# Patient Record
Sex: Male | Born: 1955 | Race: Black or African American | Hispanic: No | Marital: Married | Smoking: Never smoker
Health system: Southern US, Community
[De-identification: ages and names within clinical notes are randomized; demographics above are authoritative.]

## PROBLEM LIST (undated history)

## (undated) DIAGNOSIS — R05 Cough: Secondary | ICD-10-CM

## (undated) DIAGNOSIS — M1990 Unspecified osteoarthritis, unspecified site: Secondary | ICD-10-CM

## (undated) DIAGNOSIS — K219 Gastro-esophageal reflux disease without esophagitis: Secondary | ICD-10-CM

## (undated) DIAGNOSIS — R059 Cough, unspecified: Secondary | ICD-10-CM

## (undated) HISTORY — PX: KNEE SURGERY: SHX244

## (undated) HISTORY — PX: THUMB ARTHROSCOPY: SHX2509

---

## 2000-08-04 ENCOUNTER — Ambulatory Visit (HOSPITAL_BASED_OUTPATIENT_CLINIC_OR_DEPARTMENT_OTHER): Admission: RE | Admit: 2000-08-04 | Discharge: 2000-08-04 | Payer: Self-pay | Admitting: Orthopedic Surgery

## 2009-04-18 ENCOUNTER — Ambulatory Visit (HOSPITAL_BASED_OUTPATIENT_CLINIC_OR_DEPARTMENT_OTHER): Admission: RE | Admit: 2009-04-18 | Discharge: 2009-04-18 | Payer: Self-pay | Admitting: Orthopaedic Surgery

## 2010-05-24 LAB — POCT HEMOGLOBIN-HEMACUE: Hemoglobin: 16.6 g/dL (ref 13.0–17.0)

## 2010-07-20 NOTE — Op Note (Signed)
[facility]  Patient:    Juan Snow, Juan Snow                        MRN: [id number] Proc. Date: 08/04/00 Adm. Date:  [id number] Attending:  Marlowe Shores                           Operative Report  PREOPERATIVE DIAGNOSIS:  Right thumb ulnar collateral ligament tear.  POSTOPERATIVE DIAGNOSIS:  Right thumb ulnar collateral ligament tear.  PROCEDURE:  Repair right thumb ulnar collateral ligament.  SURGEON:  Artist Pais. Mina Marble, M.D.  ASSISTANT:  Nurse.  ANESTHESIA:  Axillary block.  TOURNIQUET TIME:  40 minutes.  COMPLICATIONS:  None.  DRAINS:  None.  DESCRIPTION OF PROCEDURE:  The patient was taken to the operating room and after the induction of adequately axillary block analgesia the right upper extremity was prepped and draped in the usual sterile fashion.  Esmarch was used to exsanguinate the limb and the tourniquet was inflated to 250 mmHg.  At this point in time the C-shaped incision was made on the ulnar side of the metacarpal phalangeal joint of the right thumb and the large dorsi based flap was elevated.  The adductor aponeurosis was split longitudinally and elevated both dorsally and volarly thus exposing the ulnar side of the joint.  A ulnar sided capsulotomy was performed and once the capsule was then opened there was a displaced ulnar collateral ligament tear with a fairly large sized osseous fragment attached to it.  The fracture site was debrided of clot and nonviable material.  A gouge was used to make a transosseous channel.  At this point in time the collateral ligament and the bony fragments were fixed using a double arm 2-0 Prolene and a Keith needle passing it ulnar to the radial side and tied over a well padded button on the radial side of the metacarpal phalangeal joint and proximal phalanx area.  This reduced the fragment and the collateral ligament and stability was provided.  At this point in time the wound was  irrigated, the capsule was closed with horizontal mattress sutures of 4-0 Mersilene in a pants over vest fashion from volar to dorsal as well as the aponeurosis being repaired in the same manner. The skin was then closed with a running 3-0 Prolene subcuticular stitch. Steri-Strips, 4 x 4s, fluffy compressive dressing was applied as well as a radial nerve splint.  The patient tolerated the procedure and went to recovery room in stable fashion. DD:  08/04/00 TD:  08/04/00 Job: [id number]

## 2011-06-17 ENCOUNTER — Encounter (HOSPITAL_COMMUNITY): Payer: Self-pay | Admitting: *Deleted

## 2011-06-17 ENCOUNTER — Ambulatory Visit (HOSPITAL_COMMUNITY)
Admission: RE | Admit: 2011-06-17 | Discharge: 2011-06-17 | Disposition: A | Discharge status: Home / Self Care | Payer: 59 | Source: Ambulatory Visit | Attending: Gastroenterology | Admitting: Gastroenterology

## 2011-06-17 ENCOUNTER — Encounter (HOSPITAL_COMMUNITY)
Admission: RE | Disposition: A | Discharge status: Home / Self Care | Payer: Self-pay | Source: Ambulatory Visit | Attending: Gastroenterology

## 2011-06-17 DIAGNOSIS — Z79899 Other long term (current) drug therapy: Secondary | ICD-10-CM | POA: Insufficient documentation

## 2011-06-17 DIAGNOSIS — D126 Benign neoplasm of colon, unspecified: Secondary | ICD-10-CM | POA: Insufficient documentation

## 2011-06-17 DIAGNOSIS — Z1211 Encounter for screening for malignant neoplasm of colon: Secondary | ICD-10-CM | POA: Insufficient documentation

## 2011-06-17 DIAGNOSIS — K219 Gastro-esophageal reflux disease without esophagitis: Secondary | ICD-10-CM | POA: Insufficient documentation

## 2011-06-17 DIAGNOSIS — D128 Benign neoplasm of rectum: Secondary | ICD-10-CM | POA: Insufficient documentation

## 2011-06-17 DIAGNOSIS — K648 Other hemorrhoids: Secondary | ICD-10-CM | POA: Insufficient documentation

## 2011-06-17 DIAGNOSIS — K5730 Diverticulosis of large intestine without perforation or abscess without bleeding: Secondary | ICD-10-CM | POA: Insufficient documentation

## 2011-06-17 HISTORY — DX: Gastro-esophageal reflux disease without esophagitis: K21.9

## 2011-06-17 HISTORY — PX: COLONOSCOPY: SHX5424

## 2011-06-17 LAB — GLUCOSE, CAPILLARY: Glucose-Capillary: 97 mg/dL (ref 70–99)

## 2011-06-17 SURGERY — COLONOSCOPY
Anesthesia: Moderate Sedation

## 2011-06-17 MED ORDER — FENTANYL CITRATE 0.05 MG/ML IJ SOLN
INTRAMUSCULAR | Status: DC | PRN
  Administered 2011-06-17 (×2): 25 ug via INTRAVENOUS

## 2011-06-17 MED ORDER — MIDAZOLAM HCL 10 MG/2ML IJ SOLN
INTRAMUSCULAR | Status: DC | PRN
  Administered 2011-06-17: 2 mg via INTRAVENOUS
  Administered 2011-06-17: 1 mg via INTRAVENOUS
  Administered 2011-06-17: 2 mg via INTRAVENOUS

## 2011-06-17 MED ORDER — DIPHENHYDRAMINE HCL 50 MG/ML IJ SOLN
INTRAMUSCULAR | Status: AC
  Filled 2011-06-17: qty 1

## 2011-06-17 MED ORDER — FENTANYL CITRATE 0.05 MG/ML IJ SOLN
INTRAMUSCULAR | Status: AC
  Filled 2011-06-17: qty 2

## 2011-06-17 MED ORDER — SODIUM CHLORIDE 0.9 % IV SOLN
Freq: Once | INTRAVENOUS | Status: AC
  Administered 2011-06-17: 500 mL via INTRAVENOUS

## 2011-06-17 MED ORDER — MIDAZOLAM HCL 10 MG/2ML IJ SOLN
INTRAMUSCULAR | Status: AC
  Filled 2011-06-17: qty 2

## 2011-06-17 NOTE — Consult Note (Signed)
Reason for Consult: Colorectal cancer screening. Referring Physician: Madelyn Flavors MD  Juan Snow is an 56 y.o. male.  HPI: Patient is here for colorectal cancer screening. He has a history of acid reflux but denies any GI complaints at this time.  Past Medical History  Diagnosis Date  . GERD (gastroesophageal reflux disease)     Past Surgical History  Procedure Date  . Knee surgery   . Thumb arthroscopy    History reviewed. No pertinent family history.  Social History:  reports that he has never smoked. He does not have any smokeless tobacco history on file. He reports that he does not drink alcohol or use illicit drugs.  Allergies: No Known Allergies  Medications: I have reviewed the patient's current medications.  Results for orders placed during the hospital encounter of 06/17/11 (from the past 48 hour(s))  GLUCOSE, CAPILLARY     Status: Normal   Collection Time   06/17/11  1:38 PM      Component Value Range Comment   Glucose-Capillary 97  70 - 99 (mg/dL)    No results found.  Review of Systems  Constitutional: Negative.   HENT: Positive for neck pain.   Eyes: Negative.   Respiratory: Negative.   Cardiovascular: Negative.   Gastrointestinal: Positive for heartburn. Negative for nausea, vomiting, abdominal pain, diarrhea, constipation, blood in stool and melena.  Genitourinary: Negative.   Musculoskeletal: Positive for joint pain.  Skin: Negative.   Neurological: Negative.   Psychiatric/Behavioral: Negative.    Blood pressure 134/98, temperature 98.1 F (36.7 C), temperature source Oral, resp. rate 20, height 5\' 8"  (1.727 m), weight 85.276 kg (188 lb). Physical Exam  Constitutional: He is oriented to person, place, and time. He appears well-developed and well-nourished.  HENT:  Head: Normocephalic and atraumatic.  Eyes: Conjunctivae and EOM are normal. Pupils are equal, round, and reactive to light.  Neck: Normal range of motion. Neck supple.    Cardiovascular: Normal rate, regular rhythm and normal heart sounds.   Respiratory: Effort normal and breath sounds normal.  GI: Soft. Bowel sounds are normal.  Musculoskeletal: Normal range of motion.  Neurological: He is alert and oriented to person, place, and time. He has normal reflexes.  Psychiatric: He has a normal mood and affect. His behavior is normal. Judgment and thought content normal.    Assessment/Plan: 1) Colorectal cancer screening: Proceed with a colonoscopy at this time.  2) Acid reflux: On Dexilant.   Juan Snow 06/17/2011, 1:42 PM

## 2011-06-17 NOTE — Op Note (Signed)
[facility] [address] [place], [place]  [address]  OPERATIVE PROCEDURE REPORT  PATIENT:  Juan Snow, Juan Snow  MR#:  [id number] BIRTHDATE:  Dec 25, 1955  GENDER:  male ENDOSCOPIST:  Dr. Lorenza Burton, MD ASSISTANT:  Anthony Sar, RN and Windell Hummingbird, technician.  PROCEDURE DATE:  06/17/2011 PRE-PROCEDURE PREPERATION:  The patient was prepped with 32 oz. of Suprep the night before the procedure and 32 oz. the morning of the procedure.  The patient was fasted for 4 hours prior to the procedure. PRE-PROCEDURE PHYSICAL:  Patient has stable vital signs. Neck is supple. There is no JVD, thyromegaly or LAD. Chest clear to auscultation. S1 and S2 regular. Abdomen soft, non-distended, non-tender with NABS. PROCEDURE:  Colonoscopy with cold biopsies x 6 and hot snare polypectomy x 2. ASA CLASS:  Class II INDICATIONS:  1) Colorectal cancer screening, average risk MEDICATIONS:  Fentanyl 50 mcg IV & Versed 5 mg IV.  DESCRIPTION OF PROCEDURE: After the risks, benefits, and alternatives of the procedure were thoroughly explained [including a 10% missed rate of cancer and polyps], informed consent was obtained.  Digital rectal exam was performed and revealed no abnormalities. The [id number]) was introduced through the anus and advanced to the terminal ileum which was intubated for a short distance, without limitations.  The quality of the prep was good.. Multiple washes were done. Small lesions could be missed. The instrument was then slowly withdrawn as the colon was fully examined. <<PROCEDUREIMAGES>>  FINDINGS:  There were scattered diverticula noted throughout the colon. One 6-7 mm sessile polyp was removed by a hot snare x 2 [150/15] from the proximal right colon. Two small sessile polyps were removed by old bospies x 4 from the recum and 2 uch [polyps were removed by cold biopsies x 2 from the rectoiogmoid colon. The rest of the colonic mucosa appeared healthy with a  normal vascular pattern.  No masses or AVM's were noted. The appendiceal orifice and the ICV were identified and photographed. The terminal ileum appeared normal. Retroflexed views revealed small interal hemorrhoids. The patient tolerated the procedure without immediate complications.  The scope was then withdrawn from the patient and the procedure terminated.  IMPRESSION:  1) Small internal hemorrhoids. 2) Pandiverticulosis. 3) Multiple colonic polyps removed [see description above]. 4) Normal terminal ileum.  RECOMMENDATIONS:  1) Await pathology results. 2) Hold all NSAIDS for 2 weeks. 3) High fiber diet with liberal fluid intake. 4) OP follow-up is advised on a PRN basis.  REPEAT EXAM:  In 5 years with a 3 day prep; in case the patient has any abnormal GI symptoms in the interim, he should contact the office immediately for further recommendations.  DISCHARGE INSTRUCTIONS:  Standard discharge instructions given.  ______________________________ Dr. Lorenza Burton, MD  CPT CODES: [phone], [phone]  DIAGNOSIS CODES:  V76.51, 211.3, 562.10  CC:  Andi Devon, M.D.  n. eSIGNED:   Dr. Lorenza Burton at 06/17/2011 02:31 PM  Collene Gobble, [phone]

## 2011-06-17 NOTE — Discharge Instructions (Addendum)
Colonoscopy Care After Read the instructions outlined below and refer to this sheet in the next few weeks. These discharge instructions provide you with general information on caring for yourself after you leave the hospital. Your doctor may also give you specific instructions. While your treatment has been planned according to the most current medical practices available, unavoidable complications occasionally occur. If you have any problems or questions after discharge, call your doctor. HOME CARE INSTRUCTIONS ACTIVITY:  You may resume your regular activity, but move at a slower pace for the next 24 hours.   Take frequent rest periods for the next 24 hours.   Walking will help get rid of the air and reduce the bloated feeling in your belly (abdomen).   No driving for 24 hours (because of the medicine (anesthesia) used during the test).   You may shower.   Do not sign any important legal documents or operate any machinery for 24 hours (because of the anesthesia used during the test).  NUTRITION:  Drink plenty of fluids.   You may resume your normal diet as instructed by your doctor.   Begin with a light meal and progress to your normal diet. Heavy or fried foods are harder to digest and may make you feel sick to your stomach (nauseated).   Avoid alcoholic beverages for 24 hours or as instructed.  MEDICATIONS:  You may resume your normal medications unless your doctor tells you otherwise.  WHAT TO EXPECT TODAY:  Some feelings of bloating in the abdomen.   Passage of more gas than usual.   Spotting of blood in your stool or on the toilet paper.  IF YOU HAD POLYPS REMOVED DURING THE COLONOSCOPY:  No aspirin products for 7 days or as instructed.   No alcohol for 7 days or as instructed.   Eat a soft diet for the next 24 hours.  FINDING OUT THE RESULTS OF YOUR TEST Not all test results are available during your visit. If your test results are not back during the visit, make an  appointment with your caregiver to find out the results. Do not assume everything is normal if you have not heard from your caregiver or the medical facility. It is important for you to follow up on all of your test results.  SEEK IMMEDIATE MEDICAL CARE IF:  You have more than a spotting of blood in your stool.   Your belly is swollen (abdominal distention).   You are nauseated or vomiting.   You have a fever.   You have abdominal pain or discomfort that is severe or gets worse throughout the day.  Document Released: 10/03/2003 Document Revised: 02/07/2011 Document Reviewed: 10/01/2007 [employer] Patient Information 2012 [employer]. 

## 2011-06-18 ENCOUNTER — Encounter (HOSPITAL_COMMUNITY): Payer: Self-pay | Admitting: Gastroenterology

## 2015-05-16 DIAGNOSIS — Z125 Encounter for screening for malignant neoplasm of prostate: Secondary | ICD-10-CM | POA: Diagnosis not present

## 2015-05-16 DIAGNOSIS — Z0000 Encounter for general adult medical examination without abnormal findings: Secondary | ICD-10-CM | POA: Diagnosis not present

## 2015-05-16 DIAGNOSIS — Z13220 Encounter for screening for lipoid disorders: Secondary | ICD-10-CM | POA: Diagnosis not present

## 2015-05-16 DIAGNOSIS — K219 Gastro-esophageal reflux disease without esophagitis: Secondary | ICD-10-CM | POA: Diagnosis not present

## 2015-06-12 MED FILL — DENTA 5000 PLUS CREAM: 1.1 | 14 days supply | Qty: 51 | Fill #0

## 2015-06-25 DIAGNOSIS — R509 Fever, unspecified: Secondary | ICD-10-CM | POA: Diagnosis not present

## 2015-06-26 ENCOUNTER — Other Ambulatory Visit: Payer: Self-pay | Admitting: Family Medicine

## 2015-06-26 ENCOUNTER — Ambulatory Visit
Admission: RE | Admit: 2015-06-26 | Discharge: 2015-06-26 | Disposition: A | Discharge status: Home / Self Care | Payer: 59 | Source: Ambulatory Visit | Attending: Family Medicine | Admitting: Family Medicine

## 2015-06-26 DIAGNOSIS — R1010 Upper abdominal pain, unspecified: Secondary | ICD-10-CM | POA: Diagnosis not present

## 2015-06-26 DIAGNOSIS — K8021 Calculus of gallbladder without cholecystitis with obstruction: Secondary | ICD-10-CM | POA: Diagnosis not present

## 2015-06-26 DIAGNOSIS — R509 Fever, unspecified: Secondary | ICD-10-CM | POA: Diagnosis not present

## 2015-06-26 DIAGNOSIS — R109 Unspecified abdominal pain: Secondary | ICD-10-CM

## 2015-06-26 DIAGNOSIS — R1013 Epigastric pain: Secondary | ICD-10-CM | POA: Diagnosis not present

## 2015-06-26 IMAGING — CT CT ABD-PELV W/O CM
2 of 4 series · 15 of 46 positions shown, 17 images · non-contrast
Comparison: None.

CLINICAL DATA: Mid upper abdominal pain for 4 days.

EXAM:
CT ABDOMEN AND PELVIS WITHOUT CONTRAST
TECHNIQUE: Multidetector CT imaging of the abdomen and pelvis was performed
following the standard protocol without IV contrast.

[Series 2: abd/pelvis w/(date) · axial · 0.72mm/px · z∈[+633,+1073]mm · 12 of 100 slices shown, 14 images]
[im 8/100  soft-tissue]
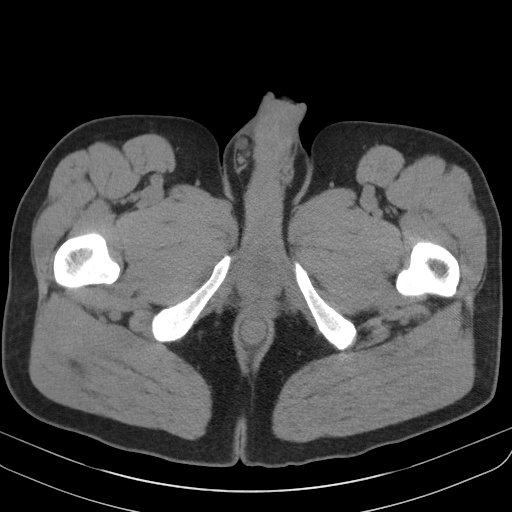
[im 8/100  bone]
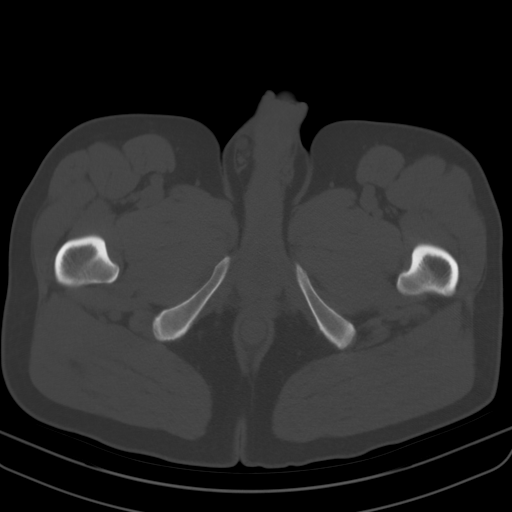
[im 16/100  soft-tissue]
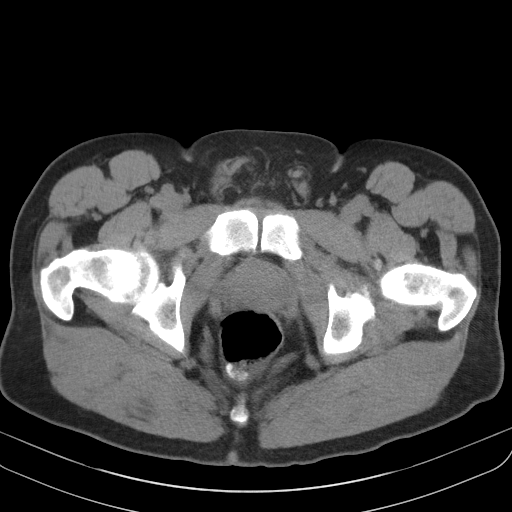
[im 24/100  soft-tissue]
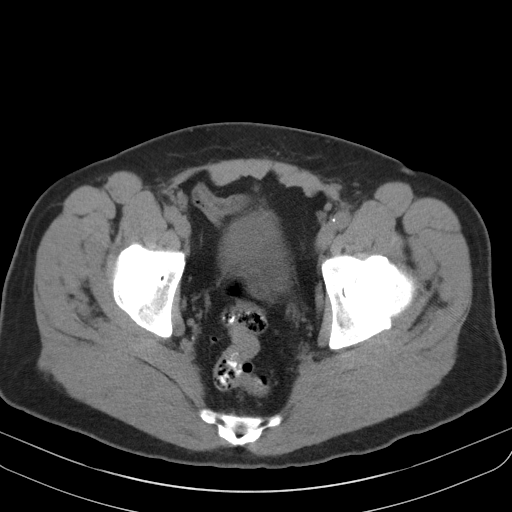
[im 32/100  soft-tissue]
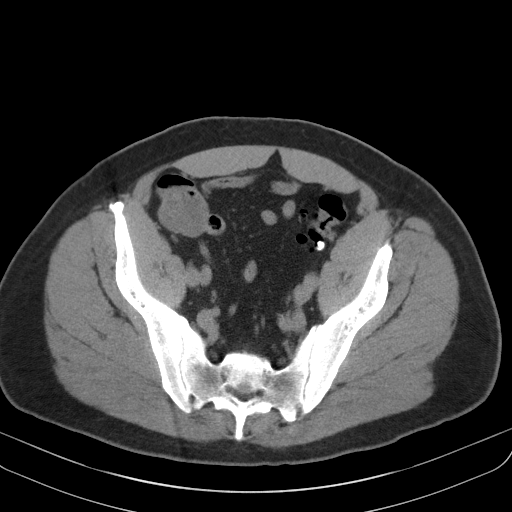
[im 40/100  soft-tissue]
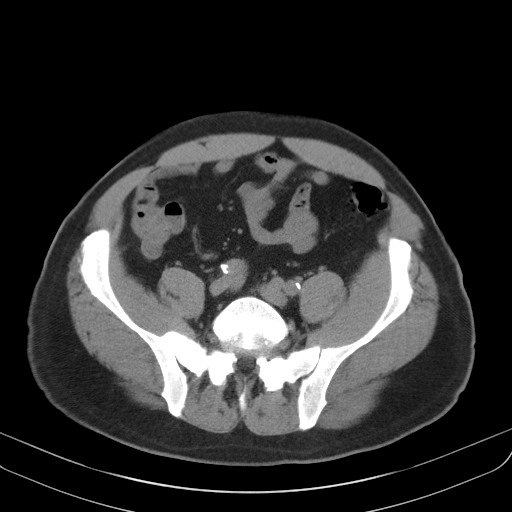
[im 48/100  soft-tissue]
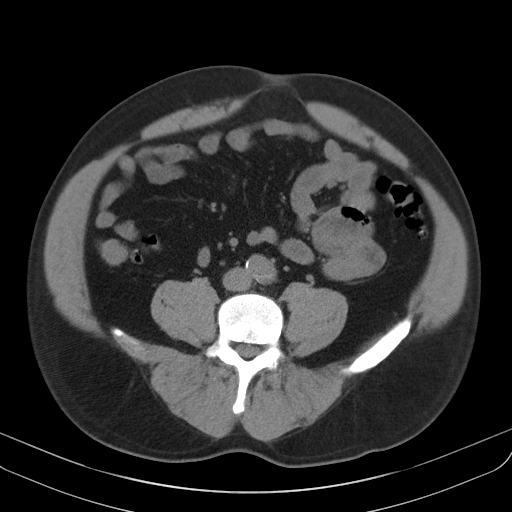
[im 56/100  soft-tissue]
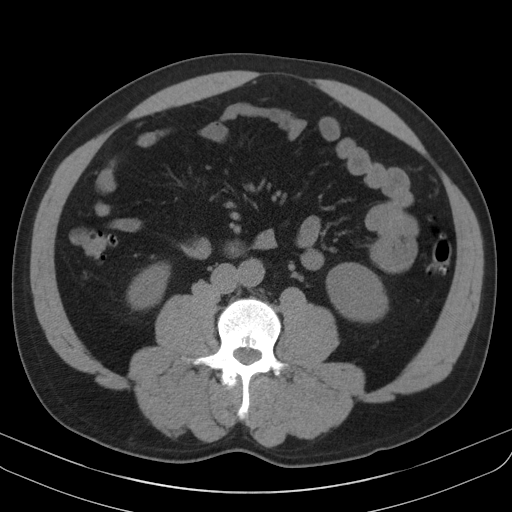
[im 64/100  soft-tissue]
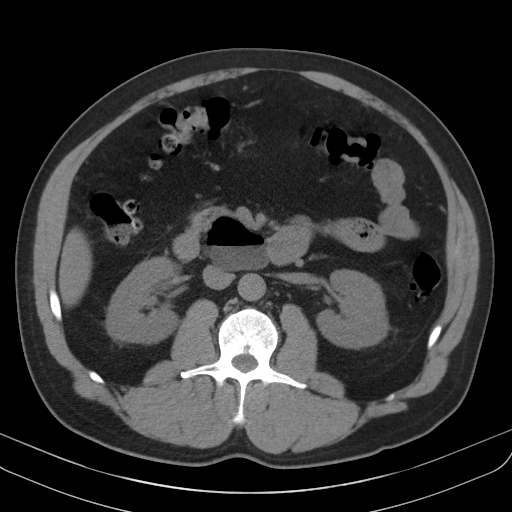
[im 72/100  soft-tissue]
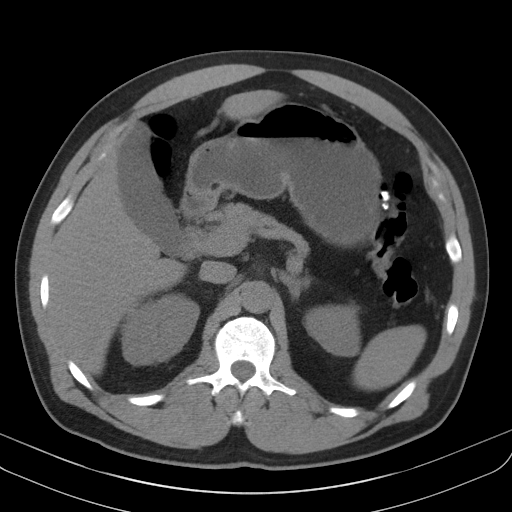
[im 72/100  bone]
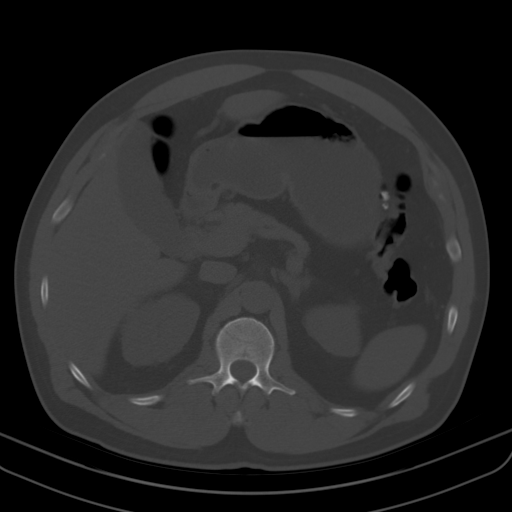
[im 80/100  soft-tissue]
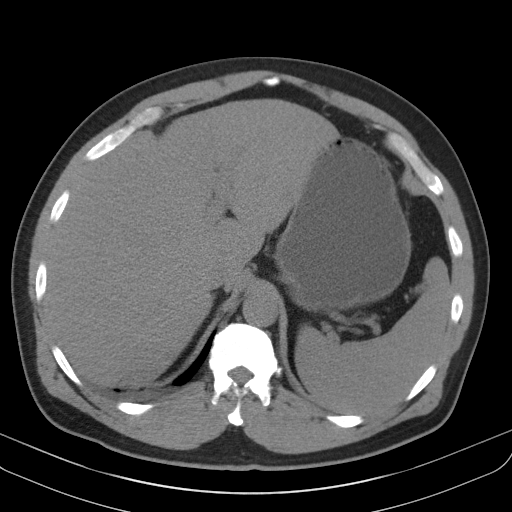
[im 88/100  soft-tissue]
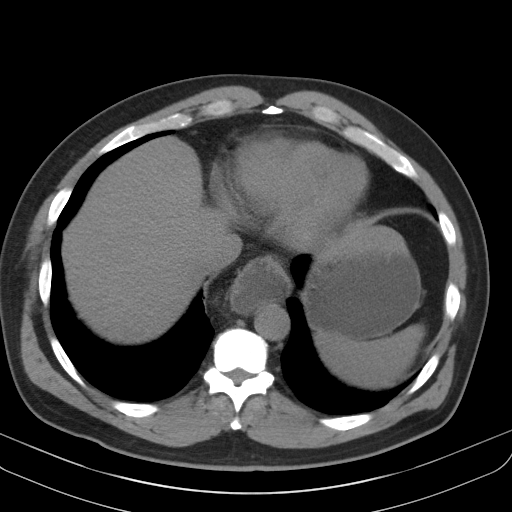
[im 96/100  soft-tissue]
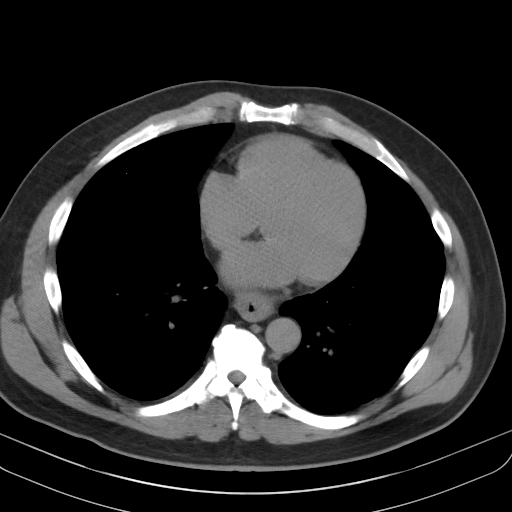

[Series 3: cor · coronal · 0.69mm/px · 3 of 105 slices shown]
[im 35/105  soft-tissue]
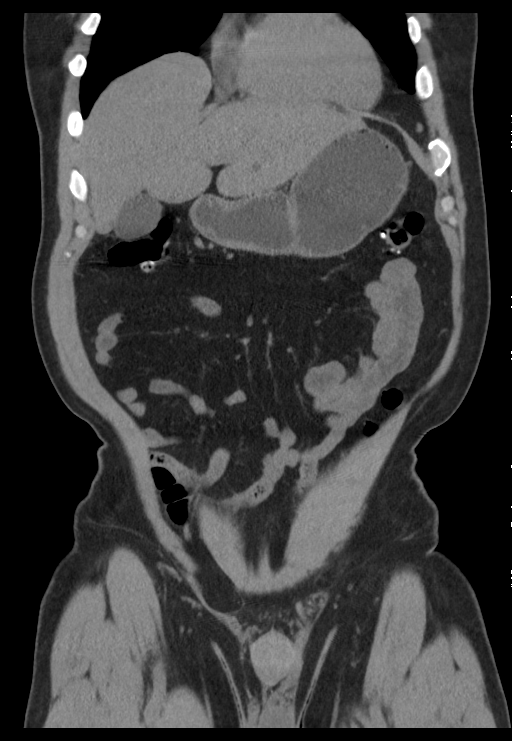
[im 47/105  soft-tissue]
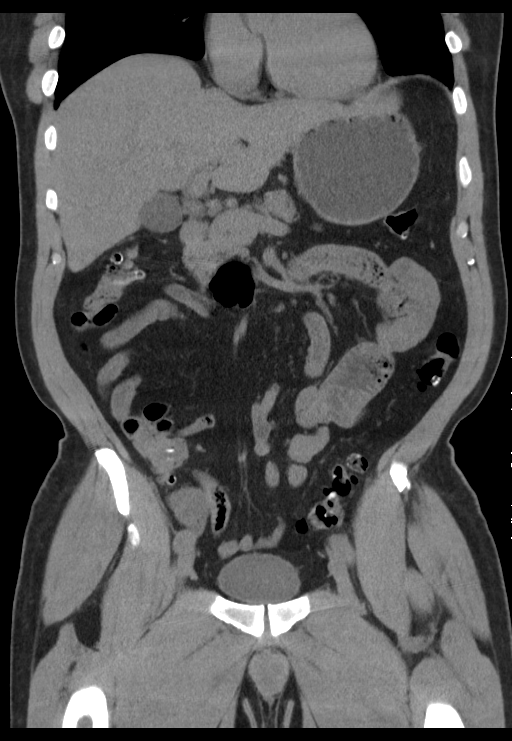
[im 58/105  soft-tissue]
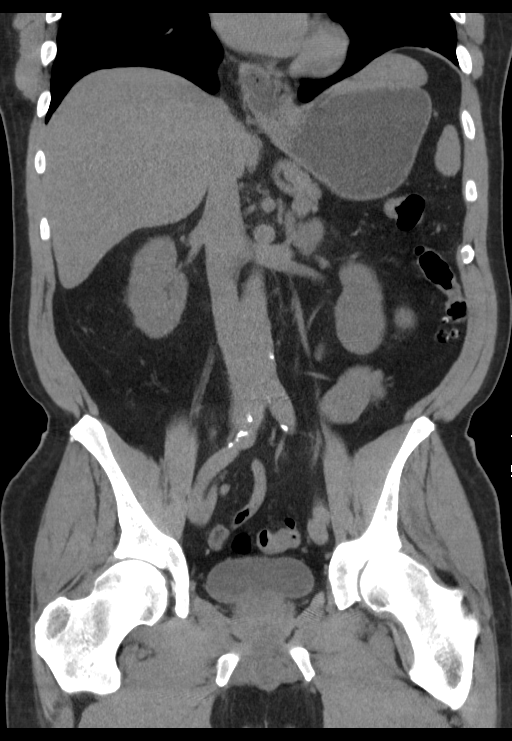

[15 of 46 positions shown; findings below may reference images not displayed]

FINDINGS: Lower chest: There is a large hiatal hernia. There is focal
eventration of the central portion of the right hemidiaphragm with
slight protrusion of the dome of the right lobe of the liver into
the right hemi thorax. This is not felt to be significant.

Hepatobiliary: There is at least 1 tiny stone in the neck of the
gallbladder. No bile duct dilatation. No gallbladder wall
thickening. There are several small low-density areas in the left
lobe of the liver including 8 and 4 mm lesions on image 22 of series
2. These probably represent benign hepatic cysts.

Pancreas: Normal.

Spleen: Normal.

Adrenals/Urinary Tract: 2.4 x 1.4 cm parapelvic cyst in the upper
pole of the right kidney. Probable 2.1 x 1.0 cm peripelvic cyst in
the inferior aspect of the left kidney. Otherwise normal.

Stomach/Bowel: Large hiatal hernia. 4 cm diverticulum in the third
portion of the duodenum. Multiple diverticula throughout the colon.
No diverticulitis. Terminal ileum and appendix are normal.

Vascular/Lymphatic: No adenopathy.  Aorto iliac atherosclerosis.

Reproductive: Normal.

Other: No free air or free fluid.

Musculoskeletal: Multilevel degenerative facet arthritis in the
lumbar spine. Broad-based small disc protrusion at L4-5. Slight
arthritis of both hip joints. No acute abnormalities.
IMPRESSION: There is at least 1 tiny stone in the neck of the gallbladder.

Probable small cysts in the left lobe of liver.

Large hiatal hernia.

Focal eventration of the central portion of the right hemidiaphragm
with slight protrusion of the dome of the right lobe of the liver
into the right hemi thorax.

Extensive diverticulosis of the colon.

## 2016-05-20 DIAGNOSIS — K219 Gastro-esophageal reflux disease without esophagitis: Secondary | ICD-10-CM | POA: Diagnosis not present

## 2016-05-20 DIAGNOSIS — Z125 Encounter for screening for malignant neoplasm of prostate: Secondary | ICD-10-CM | POA: Diagnosis not present

## 2016-05-20 DIAGNOSIS — E559 Vitamin D deficiency, unspecified: Secondary | ICD-10-CM | POA: Diagnosis not present

## 2016-05-20 DIAGNOSIS — E569 Vitamin deficiency, unspecified: Secondary | ICD-10-CM | POA: Diagnosis not present

## 2016-05-20 DIAGNOSIS — R030 Elevated blood-pressure reading, without diagnosis of hypertension: Secondary | ICD-10-CM | POA: Diagnosis not present

## 2016-05-20 DIAGNOSIS — E7800 Pure hypercholesterolemia, unspecified: Secondary | ICD-10-CM | POA: Diagnosis not present

## 2016-05-20 DIAGNOSIS — M25552 Pain in left hip: Secondary | ICD-10-CM | POA: Diagnosis not present

## 2016-05-20 DIAGNOSIS — Z0000 Encounter for general adult medical examination without abnormal findings: Secondary | ICD-10-CM | POA: Diagnosis not present

## 2016-05-20 MED FILL — MELOXICAM 15 MG TABLET: 15 | 30 days supply | Qty: 30 | Fill #0

## 2016-05-24 DIAGNOSIS — M545 Low back pain: Secondary | ICD-10-CM | POA: Diagnosis not present

## 2016-05-24 DIAGNOSIS — M25552 Pain in left hip: Secondary | ICD-10-CM | POA: Diagnosis not present

## 2016-05-29 DIAGNOSIS — M25552 Pain in left hip: Secondary | ICD-10-CM | POA: Diagnosis not present

## 2016-07-08 MED FILL — MELOXICAM 15 MG TABLET: 15 | 30 days supply | Qty: 30 | Fill #0

## 2016-07-18 DIAGNOSIS — M25552 Pain in left hip: Secondary | ICD-10-CM | POA: Diagnosis not present

## 2016-07-18 DIAGNOSIS — E7800 Pure hypercholesterolemia, unspecified: Secondary | ICD-10-CM | POA: Diagnosis not present

## 2016-07-18 DIAGNOSIS — R030 Elevated blood-pressure reading, without diagnosis of hypertension: Secondary | ICD-10-CM | POA: Diagnosis not present

## 2016-07-18 DIAGNOSIS — E559 Vitamin D deficiency, unspecified: Secondary | ICD-10-CM | POA: Diagnosis not present

## 2016-07-18 DIAGNOSIS — R748 Abnormal levels of other serum enzymes: Secondary | ICD-10-CM | POA: Diagnosis not present

## 2016-08-21 MED FILL — AMOXICILLIN 500 MG CAPSULE: 500 | 9 days supply | Qty: 30 | Fill #0

## 2016-08-21 MED FILL — MELOXICAM 15 MG TABLET: 15 | 30 days supply | Qty: 30 | Fill #1

## 2016-09-26 MED FILL — MELOXICAM 15 MG TABLET: 15 | 30 days supply | Qty: 30 | Fill #0

## 2016-11-11 MED FILL — MELOXICAM 15 MG TABLET: 15 | 30 days supply | Qty: 30 | Fill #1

## 2016-11-21 DIAGNOSIS — M25552 Pain in left hip: Secondary | ICD-10-CM | POA: Diagnosis not present

## 2016-11-21 DIAGNOSIS — R030 Elevated blood-pressure reading, without diagnosis of hypertension: Secondary | ICD-10-CM | POA: Diagnosis not present

## 2016-11-21 DIAGNOSIS — E7800 Pure hypercholesterolemia, unspecified: Secondary | ICD-10-CM | POA: Diagnosis not present

## 2016-11-21 DIAGNOSIS — Z5181 Encounter for therapeutic drug level monitoring: Secondary | ICD-10-CM | POA: Diagnosis not present

## 2016-12-17 MED FILL — MELOXICAM 15 MG TABLET: 15 | 30 days supply | Qty: 30 | Fill #2

## 2017-01-03 MED FILL — DICLOFENAC SODIUM 1% GEL: 1 | 6 days supply | Qty: 100 | Fill #0

## 2017-02-13 MED FILL — MELOXICAM 15 MG TABLET: 15 | 30 days supply | Qty: 30 | Fill #3

## 2017-03-19 DIAGNOSIS — M5416 Radiculopathy, lumbar region: Secondary | ICD-10-CM | POA: Diagnosis not present

## 2017-03-19 DIAGNOSIS — M25552 Pain in left hip: Secondary | ICD-10-CM | POA: Diagnosis not present

## 2017-03-19 DIAGNOSIS — M1612 Unilateral primary osteoarthritis, left hip: Secondary | ICD-10-CM | POA: Diagnosis not present

## 2017-03-25 MED FILL — MELOXICAM 15 MG TABLET: 15 | 30 days supply | Qty: 30 | Fill #4

## 2017-04-04 DIAGNOSIS — M25552 Pain in left hip: Secondary | ICD-10-CM | POA: Diagnosis not present

## 2017-04-04 DIAGNOSIS — M1612 Unilateral primary osteoarthritis, left hip: Secondary | ICD-10-CM | POA: Diagnosis not present

## 2017-04-09 DIAGNOSIS — M1612 Unilateral primary osteoarthritis, left hip: Secondary | ICD-10-CM | POA: Diagnosis not present

## 2017-04-21 DIAGNOSIS — R7989 Other specified abnormal findings of blood chemistry: Secondary | ICD-10-CM | POA: Diagnosis not present

## 2017-04-21 DIAGNOSIS — R030 Elevated blood-pressure reading, without diagnosis of hypertension: Secondary | ICD-10-CM | POA: Diagnosis not present

## 2017-04-21 DIAGNOSIS — E7800 Pure hypercholesterolemia, unspecified: Secondary | ICD-10-CM | POA: Diagnosis not present

## 2017-04-21 DIAGNOSIS — Z79899 Other long term (current) drug therapy: Secondary | ICD-10-CM | POA: Diagnosis not present

## 2017-04-21 DIAGNOSIS — R748 Abnormal levels of other serum enzymes: Secondary | ICD-10-CM | POA: Diagnosis not present

## 2017-04-21 DIAGNOSIS — Z01818 Encounter for other preprocedural examination: Secondary | ICD-10-CM | POA: Diagnosis not present

## 2017-04-21 DIAGNOSIS — M25552 Pain in left hip: Secondary | ICD-10-CM | POA: Diagnosis not present

## 2017-05-08 ENCOUNTER — Ambulatory Visit: Payer: Self-pay | Admitting: Orthopedic Surgery

## 2017-06-09 DIAGNOSIS — R030 Elevated blood-pressure reading, without diagnosis of hypertension: Secondary | ICD-10-CM | POA: Diagnosis not present

## 2017-06-09 DIAGNOSIS — K219 Gastro-esophageal reflux disease without esophagitis: Secondary | ICD-10-CM | POA: Diagnosis not present

## 2017-06-09 DIAGNOSIS — E559 Vitamin D deficiency, unspecified: Secondary | ICD-10-CM | POA: Diagnosis not present

## 2017-06-09 DIAGNOSIS — H524 Presbyopia: Secondary | ICD-10-CM | POA: Diagnosis not present

## 2017-06-09 DIAGNOSIS — Z125 Encounter for screening for malignant neoplasm of prostate: Secondary | ICD-10-CM | POA: Diagnosis not present

## 2017-06-09 DIAGNOSIS — Z0000 Encounter for general adult medical examination without abnormal findings: Secondary | ICD-10-CM | POA: Diagnosis not present

## 2017-06-09 DIAGNOSIS — E7800 Pure hypercholesterolemia, unspecified: Secondary | ICD-10-CM | POA: Diagnosis not present

## 2017-06-09 DIAGNOSIS — M25552 Pain in left hip: Secondary | ICD-10-CM | POA: Diagnosis not present

## 2017-06-09 DIAGNOSIS — E569 Vitamin deficiency, unspecified: Secondary | ICD-10-CM | POA: Diagnosis not present

## 2017-06-12 DIAGNOSIS — M1612 Unilateral primary osteoarthritis, left hip: Secondary | ICD-10-CM | POA: Diagnosis not present

## 2017-06-13 ENCOUNTER — Other Ambulatory Visit (HOSPITAL_COMMUNITY): Payer: Self-pay | Admitting: *Deleted

## 2017-06-13 NOTE — Patient Instructions (Addendum)
Juan Snow  06/13/2017   Your procedure is scheduled on: 06-25-17  Report to [facility] Main  Entrance  Report to admitting at 1145 AM    Call this number if you have problems the morning of surgery [phone]   Remember: Do not eat food  :After Midnight. Clear liquids from midnight until 800 am, nothing by mouth after 800 am day of surgery.     CLEAR LIQUID DIET   Foods Allowed                                                                     Foods Excluded  Coffee and tea, regular and decaf                             liquids that you cannot  Plain Jell-O in any flavor                                             see through such as: Fruit ices (not with fruit pulp)                                     milk, soups, orange juice  Iced Popsicles                                    All solid food Carbonated beverages, regular and diet                                    Cranberry, grape and apple juices Sports drinks like Gatorade Lightly seasoned clear broth or consume(fat free) Sugar, honey syrup  Sample Menu Breakfast                                Lunch                                     Supper Cranberry juice                    Beef broth                            Chicken broth Jell-O                                     Grape juice                           Apple juice Coffee or tea  Jell-O                                      Popsicle                                                Coffee or tea                        Coffee or tea  _____________________________________________________________________     Take these medicines the morning of surgery with A SIP OF WATER: NEXIUM                              You may not have any metal on your body including hair pins and              piercings  Do not wear jewelry, make-up, lotions, powders or perfumes, deodorant             Do not wear nail polish.  Do not shave  48 hours  prior to surgery.              Men may shave face and neck.   Do not bring valuables to the hospital. [place].  Contacts, dentures or bridgework may not be worn into surgery.  Leave suitcase in the car. After surgery it may be brought to your room.                 Please read over the following fact sheets you were given: _____________________________________________________________________ [facility] - Preparing for Surgery Before surgery, you can play an important role.  Because skin is not sterile, your skin needs to be as free of germs as possible.  You can reduce the number of germs on your skin by washing with CHG (chlorahexidine gluconate) soap before surgery.  CHG is an antiseptic cleaner which kills germs and bonds with the skin to continue killing germs even after washing. Please DO NOT use if you have an allergy to CHG or antibacterial soaps.  If your skin becomes reddened/irritated stop using the CHG and inform your nurse when you arrive at Short Stay. Do not shave (including legs and underarms) for at least 48 hours prior to the first CHG shower.  You may shave your face/neck. Please follow these instructions carefully:  1.  Shower with CHG Soap the night before surgery and the  morning of Surgery.  2.  If you choose to wash your hair, wash your hair first as usual with your  normal  shampoo.  3.  After you shampoo, rinse your hair and body thoroughly to remove the  shampoo.                           4.  Use CHG as you would any other liquid soap.  You can apply chg directly  to the skin and wash                       Gently with a scrungie or clean washcloth.  5.  Apply the CHG Soap to your body ONLY FROM THE NECK DOWN.   Do not use on face/ open                           Wound or open sores. Avoid contact with eyes, ears mouth and genitals (private parts).                       Wash face,  Genitals (private parts) with your  normal soap.             6.  Wash thoroughly, paying special attention to the area where your surgery  will be performed.  7.  Thoroughly rinse your body with warm water from the neck down.  8.  DO NOT shower/wash with your normal soap after using and rinsing off  the CHG Soap.                9.  Pat yourself dry with a clean towel.            10.  Wear clean pajamas.            11.  Place clean sheets on your bed the night of your first shower and do not  sleep with pets. Day of Surgery : Do not apply any lotions/deodorants the morning of surgery.  Please wear clean clothes to the hospital/surgery center.  FAILURE TO FOLLOW THESE INSTRUCTIONS MAY RESULT IN THE CANCELLATION OF YOUR SURGERY PATIENT SIGNATURE_________________________________  NURSE SIGNATURE__________________________________  ________________________________________________________________________   Adam Phenix  An incentive spirometer is a tool that can help keep your lungs clear and active. This tool measures how well you are filling your lungs with each breath. Taking long deep breaths may help reverse or decrease the chance of developing breathing (pulmonary) problems (especially infection) following:  A long period of time when you are unable to move or be active. BEFORE THE PROCEDURE   If the spirometer includes an indicator to show your best effort, your nurse or respiratory therapist will set it to a desired goal.  If possible, sit up straight or lean slightly forward. Try not to slouch.  Hold the incentive spirometer in an upright position. INSTRUCTIONS FOR USE  1. Sit on the edge of your bed if possible, or sit up as far as you can in bed or on a chair. 2. Hold the incentive spirometer in an upright position. 3. Breathe out normally. 4. Place the mouthpiece in your mouth and seal your lips tightly around it. 5. Breathe in slowly and as deeply as possible, raising the piston or the ball toward the  top of the column. 6. Hold your breath for 3-5 seconds or for as long as possible. Allow the piston or ball to fall to the bottom of the column. 7. Remove the mouthpiece from your mouth and breathe out normally. 8. Rest for a few seconds and repeat Steps 1 through 7 at least 10 times every 1-2 hours when you are awake. Take your time and take a few normal breaths between deep breaths. 9. The spirometer may include an indicator to show your best effort. Use the indicator as a goal to work toward during each repetition. 10. After each set of 10 deep breaths, practice coughing to be sure your lungs are clear. If you have an incision (the cut made at the time of surgery), support your incision when coughing by placing  a pillow or rolled up towels firmly against it. Once you are able to get out of bed, walk around indoors and cough well. You may stop using the incentive spirometer when instructed by your caregiver.  RISKS AND COMPLICATIONS  Take your time so you do not get dizzy or light-headed.  If you are in pain, you may need to take or ask for pain medication before doing incentive spirometry. It is harder to take a deep breath if you are having pain. AFTER USE  Rest and breathe slowly and easily.  It can be helpful to keep track of a log of your progress. Your caregiver can provide you with a simple table to help with this. If you are using the spirometer at home, follow these instructions: [place] IF:   You are having difficultly using the spirometer.  You have trouble using the spirometer as often as instructed.  Your pain medication is not giving enough relief while using the spirometer.  You develop fever of 100.5 F (38.1 C) or higher. SEEK IMMEDIATE MEDICAL CARE IF:   You cough up bloody sputum that had not been present before.  You develop fever of 102 F (38.9 C) or greater.  You develop worsening pain at or near the incision site. MAKE SURE YOU:   Understand  these instructions.  Will watch your condition.  Will get help right away if you are not doing well or get worse. Document Released: 07/01/2006 Document Revised: 05/13/2011 Document Reviewed: 09/01/2006 ExitCare Patient Information 2014 ExitCare, [place].   ________________________________________________________________________  WHAT IS A BLOOD TRANSFUSION? Blood Transfusion Information  A transfusion is the replacement of blood or some of its parts. Blood is made up of multiple cells which provide different functions.  Red blood cells carry oxygen and are used for blood loss replacement.  White blood cells fight against infection.  Platelets control bleeding.  Plasma helps clot blood.  Other blood products are available for specialized needs, such as hemophilia or other clotting disorders. BEFORE THE TRANSFUSION  Who gives blood for transfusions?   Healthy volunteers who are fully evaluated to make sure their blood is safe. This is blood bank blood. Transfusion therapy is the safest it has ever been in the practice of medicine. Before blood is taken from a donor, a complete history is taken to make sure that person has no history of diseases nor engages in risky social behavior (examples are intravenous drug use or sexual activity with multiple partners). The donor's travel history is screened to minimize risk of transmitting infections, such as malaria. The donated blood is tested for signs of infectious diseases, such as HIV and hepatitis. The blood is then tested to be sure it is compatible with you in order to minimize the chance of a transfusion reaction. If you or a relative donates blood, this is often done in anticipation of surgery and is not appropriate for emergency situations. It takes many days to process the donated blood. RISKS AND COMPLICATIONS Although transfusion therapy is very safe and saves many lives, the main dangers of transfusion include:   Getting an  infectious disease.  Developing a transfusion reaction. This is an allergic reaction to something in the blood you were given. Every precaution is taken to prevent this. The decision to have a blood transfusion has been considered carefully by your caregiver before blood is given. Blood is not given unless the benefits outweigh the risks. AFTER THE TRANSFUSION  Right after receiving a blood transfusion,  you will usually feel much better and more energetic. This is especially true if your red blood cells have gotten low (anemic). The transfusion raises the level of the red blood cells which carry oxygen, and this usually causes an energy increase.  The nurse administering the transfusion will monitor you carefully for complications. HOME CARE INSTRUCTIONS  No special instructions are needed after a transfusion. You may find your energy is better. Speak with your caregiver about any limitations on activity for underlying diseases you may have. SEEK MEDICAL CARE IF:   Your condition is not improving after your transfusion.  You develop redness or irritation at the intravenous (IV) site. SEEK IMMEDIATE MEDICAL CARE IF:  Any of the following symptoms occur over the next 12 hours:  Shaking chills.  You have a temperature by mouth above 102 F (38.9 C), not controlled by medicine.  Chest, back, or muscle pain.  People around you feel you are not acting correctly or are confused.  Shortness of breath or difficulty breathing.  Dizziness and fainting.  You get a rash or develop hives.  You have a decrease in urine output.  Your urine turns a dark color or changes to pink, red, or brown. Any of the following symptoms occur over the next 10 days:  You have a temperature by mouth above 102 F (38.9 C), not controlled by medicine.  Shortness of breath.  Weakness after normal activity.  The white part of the eye turns yellow (jaundice).  You have a decrease in the amount of urine or  are urinating less often.  Your urine turns a dark color or changes to pink, red, or brown. Document Released: 02/16/2000 Document Revised: 05/13/2011 Document Reviewed: 10/05/2007 [facility] Patient Information 2014 [place], [place].  _______________________________________________________________________

## 2017-06-13 NOTE — Progress Notes (Signed)
Medical clearance dr Jacelyn Grip 04-21-17 ON CHART FOR 06-25-17 SURGERY WITH DR Wynelle Link

## 2017-06-16 ENCOUNTER — Other Ambulatory Visit (HOSPITAL_COMMUNITY): Payer: Self-pay | Admitting: *Deleted

## 2017-06-18 ENCOUNTER — Other Ambulatory Visit: Payer: Self-pay

## 2017-06-18 ENCOUNTER — Encounter (HOSPITAL_COMMUNITY)
Admission: RE | Admit: 2017-06-18 | Discharge: 2017-06-18 | Disposition: A | Discharge status: Home / Self Care | Payer: 59 | Source: Ambulatory Visit | Attending: Orthopedic Surgery | Admitting: Orthopedic Surgery

## 2017-06-18 ENCOUNTER — Encounter (HOSPITAL_COMMUNITY): Payer: Self-pay

## 2017-06-18 ENCOUNTER — Encounter (INDEPENDENT_AMBULATORY_CARE_PROVIDER_SITE_OTHER): Payer: Self-pay

## 2017-06-18 DIAGNOSIS — M1612 Unilateral primary osteoarthritis, left hip: Secondary | ICD-10-CM | POA: Insufficient documentation

## 2017-06-18 DIAGNOSIS — Z01812 Encounter for preprocedural laboratory examination: Secondary | ICD-10-CM | POA: Diagnosis not present

## 2017-06-18 HISTORY — DX: Cough, unspecified: R05.9

## 2017-06-18 HISTORY — DX: Cough: R05

## 2017-06-18 HISTORY — DX: Unspecified osteoarthritis, unspecified site: M19.90

## 2017-06-18 LAB — PROTIME-INR
INR: 0.93
PROTHROMBIN TIME: 12.4 s (ref 11.4–15.2)

## 2017-06-18 LAB — ABO/RH: ABO/RH(D): B POS

## 2017-06-18 LAB — SURGICAL PCR SCREEN
MRSA, PCR: NEGATIVE
STAPHYLOCOCCUS AUREUS: NEGATIVE

## 2017-06-18 LAB — APTT: aPTT: 32 seconds (ref 24–36)

## 2017-06-18 NOTE — Progress Notes (Signed)
Cbc with dif and cmet 06-09-17 dr Jacelyn Grip on chart

## 2017-06-20 ENCOUNTER — Ambulatory Visit: Payer: Self-pay | Admitting: Orthopedic Surgery

## 2017-06-20 NOTE — H&P (View-Only) (Signed)
Juan Snow, Juan Snow (62yo, M) DOB [birth year]  Chief Complaint Left HIp Pain H&P left total hip 06-25-2017  Patient's Care Team Neurosurgeon: Peggyann Shoals MD: [address], [place], [place] [address], Ph [phone], Fax [phone] NPI: [id number] Primary Care Provider: Vernie Shanks MD: [address], [place], [place] [address], Ph [phone], Fax [phone] NPI: [id number]  Patient's Pharmacies [facility]): [address] [place] [address], Ph [phone], Fax [phone] [id number]): [address] [place] [address], Ph [phone], Fax [phone]  Vitals Ht: 5 ft 7.5 in Wt: 180 lbs BMI: 27.8  BP: 124/84 sitting R arm  Pulse: 68 bpm   Allergies Reviewed Allergies NKDA  edications Reviewed Medications Fish Oil 04/09/17   entered [name] naproxen prn 06/03/17   entered [name] NexIUM 04/09/17   entered [name] vitamin B complex 04/09/17   entered [name] Vitamin D3 04/09/17   entered [name]           Problems Reviewed Problems Osteoarthritis of left hip joint - Onset: 04/09/2017  Family History Reviewed Family History Mother - Hypertensive disorder  Social History Reviewed Social History Smoking Status: Never smoker Non-smoker Chewing tobacco: none Alcohol intake: None Hand Dominance: Right Work related injury?: N Advance directive: N Medical Power of Attorney: N  Surgical History Reviewed Surgical History Arthroscopy - 03/04/2010 - Right Knee  Past Medical History Reviewed Past Medical History GERD/Reflux: Y Joint Pain: Y Previous Cortisone Injection(s): Y   HPI The patient is here today for a pre-operative History and Physical. They are scheduled for left total hip replacement on 06-25-2017 with Dr. Wynelle Link at [facility]. Mr. [name] has been seen for ongoing left hip pain onset about a year ago, with no injury. He reports lateral hip  pain as well as groin pain. He does have some pain going down his leg at times.He reports stiffness and popping in his hip. He has previously been evaluated by Dr. Vertell Limber, due to some left sided sciatic pain. He also saw Dr. Percell Miller about a year ago. He did have some recent x-rays of his back and hip at Dr. Melven Sartorius office; those are available on the [facility] system. He reports that he had an intra-articular injection on Friday February 1st. He states that this is the actually the second injection he has had. He does feel like the first injection was more beneficial at this point, however, he has gotten some temporary relief with the most recent injection. He is taking meloxicam for pain. No history of significant steroid exposure or alcohol abuse. Have any history of trauma to the hip.  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 He has had 2 injections in his left hip the last one about a month or about a week ago which has not helped at all the one that he had last year helped. With the collapse and flattening of his head and bone-on-bone findings really the only thing that can help his total hip arthroplasty. We discussed total hip arthroplasty by anterior approach wrist benefits and aftercare and he is agreeable with this and wants to proceed.   ROS CONSTITUTIONAL: no Fever, no Weight Gain, no Weight Loss  HEAD, EARS, EYES, NOSE, AND THROAT: no Dry Eyes, no Eye Irritation, no Vision Changes, no Sore Throat  CARDIOVASCULAR: no Chest Pain / Angina,  no Palpitations, no Poor Circulation, no Leg Swelling  RESPIRATORY: no Cough, no Shortness of Breath, no COPD, no Asthma  GASTROINTESTINAL: no Nausea / Vomiting, no Black Stools / Blood in Stool, no Diarrhea, no Vomiting Blood  GENITOURINARY: no Blood in Urine, no Difficulty Urinating  MUSCULOSKELETAL: JOINT PAIN, JOINT SWELLING, but no Rheumatoid  Arthritis, no Lost 2 or more inches in Height  SKIN: no Rash, no Varicose Veins  NEUROLOGIC: no Numbness, no Seizures, no Dizziness, no Problems with Balance  PSYCHIATRIC: no Anxiety, no Depression  ENDOCRINE: no Heat Intolerance  BLOOD / LYMPH: no Anemia, no Bleed Easily, no Bruise Easily, no Swollen Glands  Physical Exam Patient is a 62 year old male.  General Mental Status - Alert, cooperative and good historian. General Appearance - pleasant, Not in acute distress. Orientation - Oriented X3. Build & Nutrition - Well nourished and Well developed.  Head and Neck Head - normocephalic, atraumatic . Neck Global Assessment - supple, no bruit auscultated on the right, no bruit auscultated on the left.  Eye Pupil - Bilateral - PERR Motion - Bilateral - EOMI.  Chest and Lung Exam Auscultation Breath sounds - clear at anterior chest wall and clear at posterior chest wall. Adventitious sounds - No Adventitious sounds.  Cardiovascular Auscultation Rhythm - Regular rate and rhythm. Heart Sounds - S1 WNL and S2 WNL. Murmurs & Other Heart Sounds - Auscultation of the heart reveals - No Murmurs.  Abdomen Palpation/Percussion Tenderness - Abdomen is non-tender to palpation. Abdomen is soft. Auscultation Auscultation of the abdomen reveals - Bowel sounds normal.  Male Genitourinary Note: Not done, not pertinent to present illness  Musculoskeletal His left hip shows full extension. Further flexion to 105 with pain in the anterior groin. External rotation of 15 with pain in the anterior groin and internal rotation to 0 with pain in the anterior groin. Sensation and circulation are intact. 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   Assessment / Plan 1. Osteoarthritis of left hip joint M16.12: Unilateral primary osteoarthritis, left hip  2. 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 Jacelyn Grip - seen and cleared to proceed with surgery IV TXA Anesthesia Issues: None Patient was instructed on what medications to stop prior to surgery. - Follow up visit in 2 weeks with Dr. Wynelle Link - Begin physical therapy following surgery - Pre-operative lab work as pre Pre-Surgical Testing - Prescriptions will be provided in hospital at time of discharge  Return to [place], MD for [facility] on 07/08/2017 at 01:45 PM  Encounter signed-off by Mickel Crow, PA-C

## 2017-06-20 NOTE — H&P (Signed)
XAYDEN, LINSEY (62yo, M) DOB [birth year]  Chief Complaint Left HIp Pain H&P left total hip 06-25-2017  Patient's Care Team Neurosurgeon: Peggyann Shoals MD: [address], [place], [place] [address], Ph [phone], Fax [phone] NPI: [id number] Primary Care Provider: Vernie Shanks MD: [address], [place], [place] [address], Ph [phone], Fax [phone] NPI: [id number]  Patient's Pharmacies [facility]): [address] [place] [address], Ph [phone], Fax [phone] [id number]): [address] [place] [address], Ph [phone], Fax [phone]  Vitals Ht: 5 ft 7.5 in Wt: 180 lbs BMI: 27.8  BP: 124/84 sitting R arm  Pulse: 68 bpm   Allergies Reviewed Allergies NKDA  edications Reviewed Medications Fish Oil 04/09/17   entered [name] naproxen prn 06/03/17   entered [name] NexIUM 04/09/17   entered [name] vitamin B complex 04/09/17   entered [name] Vitamin D3 04/09/17   entered [name]           Problems Reviewed Problems Osteoarthritis of left hip joint - Onset: 04/09/2017  Family History Reviewed Family History Mother - Hypertensive disorder  Social History Reviewed Social History Smoking Status: Never smoker Non-smoker Chewing tobacco: none Alcohol intake: None Hand Dominance: Right Work related injury?: N Advance directive: N Medical Power of Attorney: N  Surgical History Reviewed Surgical History Arthroscopy - 03/04/2010 - Right Knee  Past Medical History Reviewed Past Medical History GERD/Reflux: Y Joint Pain: Y Previous Cortisone Injection(s): Y   HPI The patient is here today for a pre-operative History and Physical. They are scheduled for left total hip replacement on 06-25-2017 with Dr. Wynelle Link at [facility]. Mr. Yurchak has been seen for ongoing left hip pain onset about a year ago, with no injury. He reports lateral hip  pain as well as groin pain. He does have some pain going down his leg at times.He reports stiffness and popping in his hip. He has previously been evaluated by Dr. Vertell Limber, due to some left sided sciatic pain. He also saw Dr. Percell Miller about a year ago. He did have some recent x-rays of his back and hip at Dr. Melven Sartorius office; those are available on the [facility] system. He reports that he had an intra-articular injection on Friday February 1st. He states that this is the actually the second injection he has had. He does feel like the first injection was more beneficial at this point, however, he has gotten some temporary relief with the most recent injection. He is taking meloxicam for pain. No history of significant steroid exposure or alcohol abuse. Have any history of trauma to the hip.  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 He has had 2 injections in his left hip the last one about a month or about a week ago which has not helped at all the one that he had last year helped. With the collapse and flattening of his head and bone-on-bone findings really the only thing that can help his total hip arthroplasty. We discussed total hip arthroplasty by anterior approach wrist benefits and aftercare and he is agreeable with this and wants to proceed.   ROS CONSTITUTIONAL: no Fever, no Weight Gain, no Weight Loss  HEAD, EARS, EYES, NOSE, AND THROAT: no Dry Eyes, no Eye Irritation, no Vision Changes, no Sore Throat  CARDIOVASCULAR: no Chest Pain / Angina,  no Palpitations, no Poor Circulation, no Leg Swelling  RESPIRATORY: no Cough, no Shortness of Breath, no COPD, no Asthma  GASTROINTESTINAL: no Nausea / Vomiting, no Black Stools / Blood in Stool, no Diarrhea, no Vomiting Blood  GENITOURINARY: no Blood in Urine, no Difficulty Urinating  MUSCULOSKELETAL: JOINT PAIN, JOINT SWELLING, but no Rheumatoid  Arthritis, no Lost 2 or more inches in Height  SKIN: no Rash, no Varicose Veins  NEUROLOGIC: no Numbness, no Seizures, no Dizziness, no Problems with Balance  PSYCHIATRIC: no Anxiety, no Depression  ENDOCRINE: no Heat Intolerance  BLOOD / LYMPH: no Anemia, no Bleed Easily, no Bruise Easily, no Swollen Glands  Physical Exam Patient is a 62 year old male.  General Mental Status - Alert, cooperative and good historian. General Appearance - pleasant, Not in acute distress. Orientation - Oriented X3. Build & Nutrition - Well nourished and Well developed.  Head and Neck Head - normocephalic, atraumatic . Neck Global Assessment - supple, no bruit auscultated on the right, no bruit auscultated on the left.  Eye Pupil - Bilateral - PERR Motion - Bilateral - EOMI.  Chest and Lung Exam Auscultation Breath sounds - clear at anterior chest wall and clear at posterior chest wall. Adventitious sounds - No Adventitious sounds.  Cardiovascular Auscultation Rhythm - Regular rate and rhythm. Heart Sounds - S1 WNL and S2 WNL. Murmurs & Other Heart Sounds - Auscultation of the heart reveals - No Murmurs.  Abdomen Palpation/Percussion Tenderness - Abdomen is non-tender to palpation. Abdomen is soft. Auscultation Auscultation of the abdomen reveals - Bowel sounds normal.  Male Genitourinary Note: Not done, not pertinent to present illness  Musculoskeletal His left hip shows full extension. Further flexion to 105 with pain in the anterior groin. External rotation of 15 with pain in the anterior groin and internal rotation to 0 with pain in the anterior groin. Sensation and circulation are intact. 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   Assessment / Plan 1. Osteoarthritis of left hip joint M16.12: Unilateral primary osteoarthritis, left hip  2. 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 Jacelyn Grip - seen and cleared to proceed with surgery IV TXA Anesthesia Issues: None Patient was instructed on what medications to stop prior to surgery. - Follow up visit in 2 weeks with Dr. Wynelle Link - Begin physical therapy following surgery - Pre-operative lab work as pre Pre-Surgical Testing - Prescriptions will be provided in hospital at time of discharge  Return to [place], MD for [facility] on 07/08/2017 at 01:45 PM  Encounter signed-off by Mickel Crow, PA-C

## 2017-06-24 MED ORDER — TRANEXAMIC ACID 1000 MG/10ML IV SOLN
1000.0000 mg | INTRAVENOUS | Status: AC
  Administered 2017-06-25: 1000 mg via INTRAVENOUS
  Filled 2017-06-24: qty 1100

## 2017-06-25 ENCOUNTER — Encounter (HOSPITAL_COMMUNITY): Payer: Self-pay | Admitting: Certified Registered"

## 2017-06-25 ENCOUNTER — Inpatient Hospital Stay (HOSPITAL_COMMUNITY): Payer: 59

## 2017-06-25 ENCOUNTER — Inpatient Hospital Stay (HOSPITAL_COMMUNITY)
Admission: RE | Admit: 2017-06-25 | Discharge: 2017-06-26 | DRG: 470 | Disposition: A | Discharge status: Home Health | Payer: 59 | Source: Ambulatory Visit | Attending: Orthopedic Surgery | Admitting: Orthopedic Surgery

## 2017-06-25 ENCOUNTER — Other Ambulatory Visit: Payer: Self-pay

## 2017-06-25 ENCOUNTER — Inpatient Hospital Stay (HOSPITAL_COMMUNITY): Payer: 59 | Admitting: Certified Registered"

## 2017-06-25 ENCOUNTER — Encounter (HOSPITAL_COMMUNITY)
Admission: RE | Disposition: A | Discharge status: Home Health | Payer: Self-pay | Source: Ambulatory Visit | Attending: Orthopedic Surgery

## 2017-06-25 DIAGNOSIS — M1612 Unilateral primary osteoarthritis, left hip: Secondary | ICD-10-CM | POA: Diagnosis not present

## 2017-06-25 DIAGNOSIS — M169 Osteoarthritis of hip, unspecified: Secondary | ICD-10-CM | POA: Diagnosis present

## 2017-06-25 DIAGNOSIS — M879 Osteonecrosis, unspecified: Secondary | ICD-10-CM | POA: Diagnosis present

## 2017-06-25 DIAGNOSIS — K219 Gastro-esophageal reflux disease without esophagitis: Secondary | ICD-10-CM | POA: Diagnosis present

## 2017-06-25 DIAGNOSIS — Z471 Aftercare following joint replacement surgery: Secondary | ICD-10-CM | POA: Diagnosis not present

## 2017-06-25 DIAGNOSIS — M25552 Pain in left hip: Secondary | ICD-10-CM | POA: Diagnosis present

## 2017-06-25 DIAGNOSIS — Z96649 Presence of unspecified artificial hip joint: Secondary | ICD-10-CM

## 2017-06-25 DIAGNOSIS — Z8249 Family history of ischemic heart disease and other diseases of the circulatory system: Secondary | ICD-10-CM

## 2017-06-25 DIAGNOSIS — Z96642 Presence of left artificial hip joint: Secondary | ICD-10-CM | POA: Diagnosis not present

## 2017-06-25 HISTORY — PX: TOTAL HIP ARTHROPLASTY: SHX124

## 2017-06-25 LAB — TYPE AND SCREEN
ABO/RH(D): B POS
ANTIBODY SCREEN: NEGATIVE

## 2017-06-25 IMAGING — DX DG PORTABLE PELVIS
1 series · 1 of 1 positions shown · non-contrast
Comparison: Fluoroscopic images of same day.

CLINICAL DATA: Status post left hip replacement.

EXAM:
PORTABLE PELVIS 1-2 VIEWS

[pelvis ap]
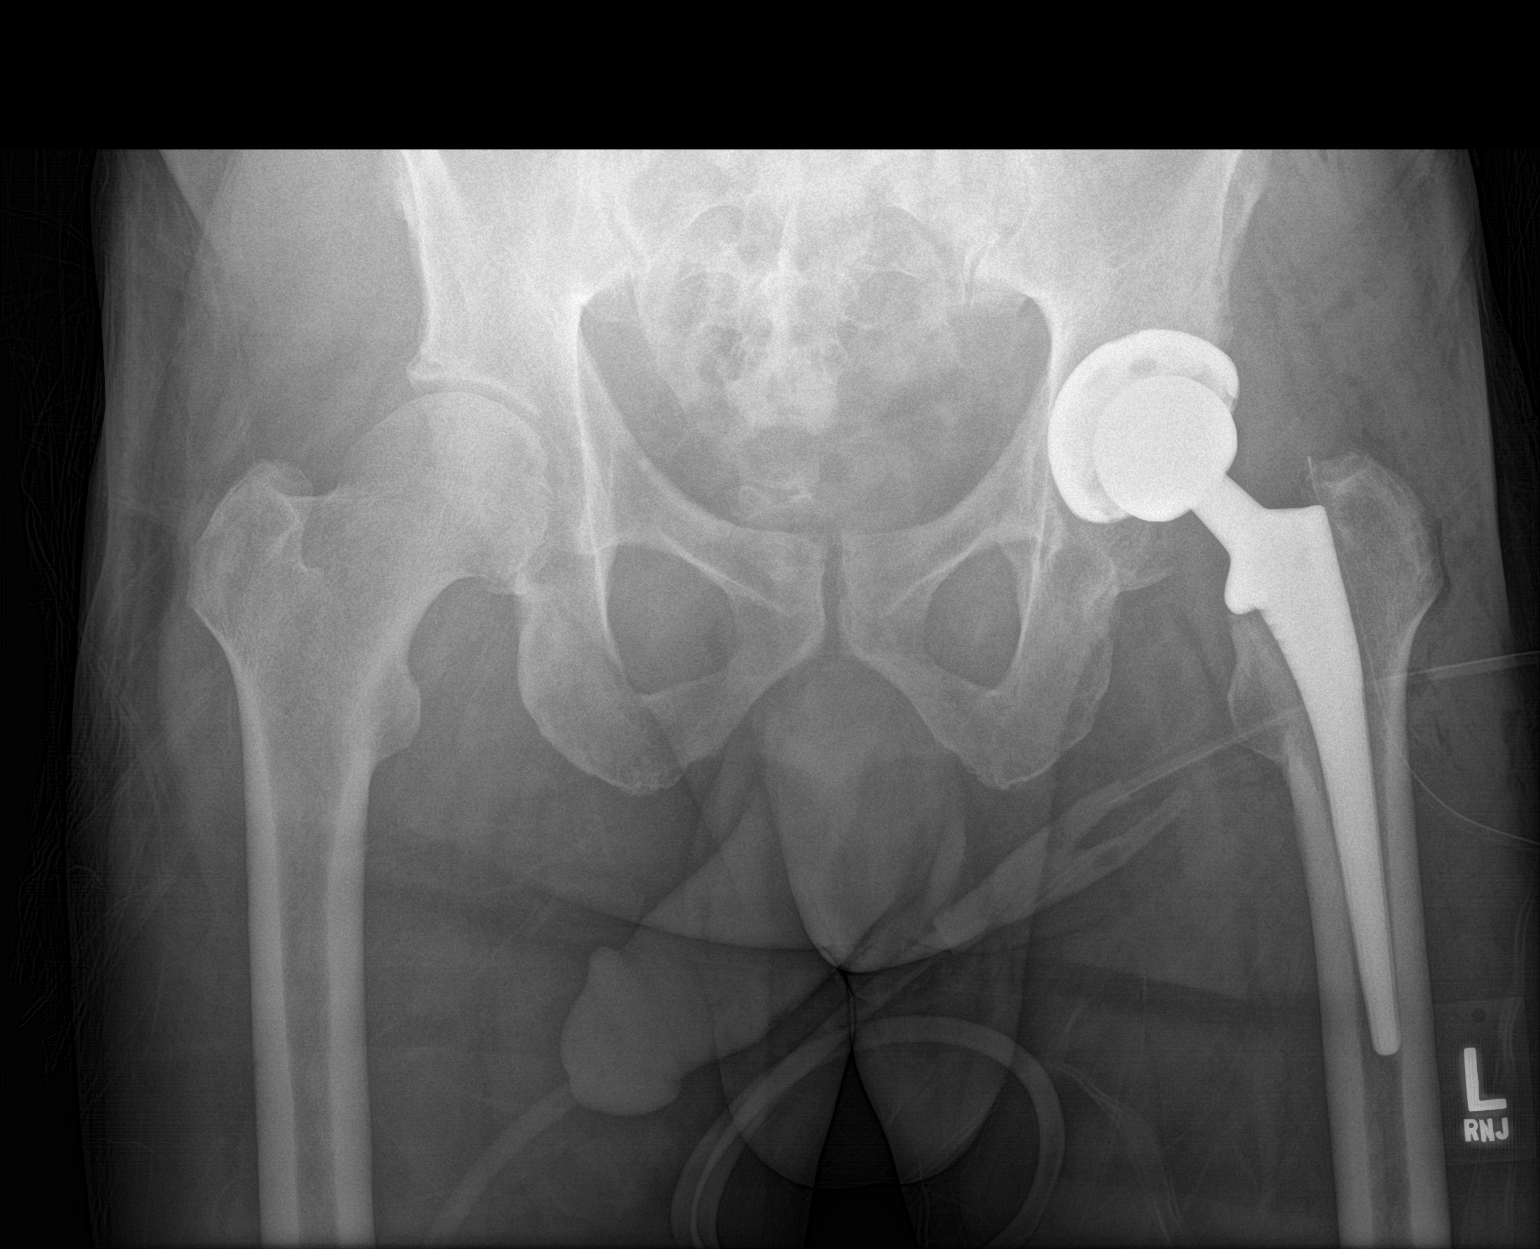

[1 of 1 positions shown; findings below may reference images not displayed]

FINDINGS: The femoral and acetabular components appear to be well situated. No
fracture or dislocation is noted. Expected postoperative changes are
noted in the surrounding soft tissues.
IMPRESSION: Status post left total hip arthroplasty.

## 2017-06-25 SURGERY — ARTHROPLASTY, HIP, TOTAL, ANTERIOR APPROACH
Anesthesia: Spinal | Site: Hip | Laterality: Left

## 2017-06-25 MED ORDER — METOCLOPRAMIDE HCL 5 MG/ML IJ SOLN: 5.0000 mg | Freq: Three times a day (TID) | INTRAMUSCULAR | Status: DC | PRN

## 2017-06-25 MED ORDER — SODIUM CHLORIDE 0.9 % IR SOLN
Status: DC | PRN
  Administered 2017-06-25: 1000 mL

## 2017-06-25 MED ORDER — RIVAROXABAN 10 MG PO TABS: 10.0000 mg | ORAL_TABLET | Freq: Every day | ORAL | 0 refills | Status: DC

## 2017-06-25 MED ORDER — DEXAMETHASONE SODIUM PHOSPHATE 10 MG/ML IJ SOLN
10.0000 mg | Freq: Once | INTRAMUSCULAR | Status: AC
  Administered 2017-06-25: 10 mg via INTRAVENOUS

## 2017-06-25 MED ORDER — SODIUM CHLORIDE 0.9 % IV SOLN
INTRAVENOUS | Status: DC
  Administered 2017-06-25: 1000 mL via INTRAVENOUS
  Administered 2017-06-25: via INTRAVENOUS

## 2017-06-25 MED ORDER — BUPIVACAINE HCL (PF) 0.25 % IJ SOLN
INTRAMUSCULAR | Status: DC | PRN
  Administered 2017-06-25: 30 mL

## 2017-06-25 MED ORDER — FLEET ENEMA 7-19 GM/118ML RE ENEM: 1.0000 | ENEMA | Freq: Once | RECTAL | Status: DC | PRN

## 2017-06-25 MED ORDER — MEPERIDINE HCL 50 MG/ML IJ SOLN: 6.2500 mg | INTRAMUSCULAR | Status: DC | PRN

## 2017-06-25 MED ORDER — ONDANSETRON HCL 4 MG/2ML IJ SOLN
INTRAMUSCULAR | Status: DC | PRN
  Administered 2017-06-25: 4 mg via INTRAVENOUS

## 2017-06-25 MED ORDER — MORPHINE SULFATE (PF) 2 MG/ML IV SOLN
0.5000 mg | INTRAVENOUS | Status: DC | PRN
  Administered 2017-06-25 (×2): 1 mg via INTRAVENOUS
  Filled 2017-06-25 (×2): qty 1

## 2017-06-25 MED ORDER — FENTANYL CITRATE (PF) 250 MCG/5ML IJ SOLN
INTRAMUSCULAR | Status: AC
  Filled 2017-06-25: qty 5

## 2017-06-25 MED ORDER — PHENYLEPHRINE 40 MCG/ML (10ML) SYRINGE FOR IV PUSH (FOR BLOOD PRESSURE SUPPORT)
PREFILLED_SYRINGE | INTRAVENOUS | Status: AC
  Filled 2017-06-25: qty 10

## 2017-06-25 MED ORDER — PHENOL 1.4 % MT LIQD
1.0000 | OROMUCOSAL | Status: DC | PRN
  Filled 2017-06-25: qty 177

## 2017-06-25 MED ORDER — ACETAMINOPHEN 10 MG/ML IV SOLN
1000.0000 mg | Freq: Once | INTRAVENOUS | Status: AC
  Administered 2017-06-25: 1000 mg via INTRAVENOUS
  Filled 2017-06-25: qty 100

## 2017-06-25 MED ORDER — LIDOCAINE 2% (20 MG/ML) 5 ML SYRINGE
INTRAMUSCULAR | Status: DC | PRN
  Administered 2017-06-25: 40 mg via INTRAVENOUS

## 2017-06-25 MED ORDER — EPHEDRINE SULFATE-NACL 50-0.9 MG/10ML-% IV SOSY
PREFILLED_SYRINGE | INTRAVENOUS | Status: DC | PRN
  Administered 2017-06-25: 15 mg via INTRAVENOUS

## 2017-06-25 MED ORDER — PHENYLEPHRINE HCL 10 MG/ML IJ SOLN
INTRAVENOUS | Status: DC | PRN
  Administered 2017-06-25: 25 ug/min via INTRAVENOUS

## 2017-06-25 MED ORDER — CEFAZOLIN SODIUM-DEXTROSE 2-4 GM/100ML-% IV SOLN
2.0000 g | Freq: Four times a day (QID) | INTRAVENOUS | Status: AC
  Administered 2017-06-25 – 2017-06-26 (×2): 2 g via INTRAVENOUS
  Filled 2017-06-25 (×2): qty 100

## 2017-06-25 MED ORDER — METHOCARBAMOL 1000 MG/10ML IJ SOLN
500.0000 mg | Freq: Four times a day (QID) | INTRAVENOUS | Status: DC | PRN
  Filled 2017-06-25: qty 5

## 2017-06-25 MED ORDER — ONDANSETRON HCL 4 MG PO TABS: 4.0000 mg | ORAL_TABLET | Freq: Four times a day (QID) | ORAL | Status: DC | PRN

## 2017-06-25 MED ORDER — TRAMADOL HCL 50 MG PO TABS
50.0000 mg | ORAL_TABLET | Freq: Four times a day (QID) | ORAL | Status: DC | PRN
  Administered 2017-06-26: 100 mg via ORAL
  Filled 2017-06-25 (×2): qty 2

## 2017-06-25 MED ORDER — DIPHENHYDRAMINE HCL 12.5 MG/5ML PO ELIX: 12.5000 mg | ORAL_SOLUTION | ORAL | Status: DC | PRN

## 2017-06-25 MED ORDER — DEXAMETHASONE SODIUM PHOSPHATE 10 MG/ML IJ SOLN
INTRAMUSCULAR | Status: AC
  Filled 2017-06-25: qty 1

## 2017-06-25 MED ORDER — LACTATED RINGERS IV SOLN
INTRAVENOUS | Status: DC
  Administered 2017-06-25 (×2): via INTRAVENOUS

## 2017-06-25 MED ORDER — DEXAMETHASONE SODIUM PHOSPHATE 10 MG/ML IJ SOLN
10.0000 mg | Freq: Once | INTRAMUSCULAR | Status: AC
  Administered 2017-06-26: 10 mg via INTRAVENOUS
  Filled 2017-06-25: qty 1

## 2017-06-25 MED ORDER — CHLORHEXIDINE GLUCONATE 4 % EX LIQD: 60.0000 mL | Freq: Once | CUTANEOUS | Status: DC

## 2017-06-25 MED ORDER — EPHEDRINE 5 MG/ML INJ
INTRAVENOUS | Status: AC
  Filled 2017-06-25: qty 20

## 2017-06-25 MED ORDER — METOCLOPRAMIDE HCL 5 MG PO TABS: 5.0000 mg | ORAL_TABLET | Freq: Three times a day (TID) | ORAL | Status: DC | PRN

## 2017-06-25 MED ORDER — CEFAZOLIN SODIUM-DEXTROSE 2-4 GM/100ML-% IV SOLN
2.0000 g | INTRAVENOUS | Status: AC
  Administered 2017-06-25: 2 g via INTRAVENOUS
  Filled 2017-06-25: qty 100

## 2017-06-25 MED ORDER — MIDAZOLAM HCL 2 MG/2ML IJ SOLN
INTRAMUSCULAR | Status: AC
  Filled 2017-06-25: qty 2

## 2017-06-25 MED ORDER — PHENYLEPHRINE 40 MCG/ML (10ML) SYRINGE FOR IV PUSH (FOR BLOOD PRESSURE SUPPORT)
PREFILLED_SYRINGE | INTRAVENOUS | Status: DC | PRN
  Administered 2017-06-25: 160 ug via INTRAVENOUS
  Administered 2017-06-25: 80 ug via INTRAVENOUS
  Administered 2017-06-25: 120 ug via INTRAVENOUS

## 2017-06-25 MED ORDER — DOCUSATE SODIUM 100 MG PO CAPS
100.0000 mg | ORAL_CAPSULE | Freq: Two times a day (BID) | ORAL | Status: DC
  Administered 2017-06-25 – 2017-06-26 (×2): 100 mg via ORAL
  Filled 2017-06-25 (×2): qty 1

## 2017-06-25 MED ORDER — SUGAMMADEX SODIUM 200 MG/2ML IV SOLN
INTRAVENOUS | Status: AC
  Filled 2017-06-25: qty 2

## 2017-06-25 MED ORDER — HYDROMORPHONE HCL 1 MG/ML IJ SOLN: 0.2500 mg | INTRAMUSCULAR | Status: DC | PRN

## 2017-06-25 MED ORDER — MIDAZOLAM HCL 2 MG/2ML IJ SOLN
INTRAMUSCULAR | Status: DC | PRN
  Administered 2017-06-25: 2 mg via INTRAVENOUS

## 2017-06-25 MED ORDER — PHENYLEPHRINE HCL 10 MG/ML IJ SOLN
INTRAMUSCULAR | Status: AC
  Filled 2017-06-25: qty 2

## 2017-06-25 MED ORDER — PANTOPRAZOLE SODIUM 40 MG PO TBEC
40.0000 mg | DELAYED_RELEASE_TABLET | Freq: Every day | ORAL | Status: DC
  Administered 2017-06-26: 40 mg via ORAL
  Filled 2017-06-25: qty 1

## 2017-06-25 MED ORDER — METHOCARBAMOL 500 MG PO TABS
500.0000 mg | ORAL_TABLET | Freq: Four times a day (QID) | ORAL | Status: DC | PRN
  Administered 2017-06-25 – 2017-06-26 (×3): 500 mg via ORAL
  Filled 2017-06-25 (×3): qty 1

## 2017-06-25 MED ORDER — METHOCARBAMOL 500 MG PO TABS: 500.0000 mg | ORAL_TABLET | Freq: Four times a day (QID) | ORAL | 0 refills | Status: DC | PRN

## 2017-06-25 MED ORDER — FENTANYL CITRATE (PF) 250 MCG/5ML IJ SOLN
INTRAMUSCULAR | Status: DC | PRN
  Administered 2017-06-25 (×2): 50 ug via INTRAVENOUS

## 2017-06-25 MED ORDER — ONDANSETRON HCL 4 MG/2ML IJ SOLN: 4.0000 mg | Freq: Four times a day (QID) | INTRAMUSCULAR | Status: DC | PRN

## 2017-06-25 MED ORDER — PROPOFOL 10 MG/ML IV BOLUS
INTRAVENOUS | Status: AC
  Filled 2017-06-25: qty 20

## 2017-06-25 MED ORDER — ONDANSETRON HCL 4 MG/2ML IJ SOLN
INTRAMUSCULAR | Status: AC
  Filled 2017-06-25: qty 2

## 2017-06-25 MED ORDER — POLYETHYLENE GLYCOL 3350 17 G PO PACK: 17.0000 g | PACK | Freq: Every day | ORAL | Status: DC | PRN

## 2017-06-25 MED ORDER — BISACODYL 10 MG RE SUPP: 10.0000 mg | Freq: Every day | RECTAL | Status: DC | PRN

## 2017-06-25 MED ORDER — LIDOCAINE 2% (20 MG/ML) 5 ML SYRINGE
INTRAMUSCULAR | Status: AC
  Filled 2017-06-25: qty 5

## 2017-06-25 MED ORDER — PROPOFOL 500 MG/50ML IV EMUL
INTRAVENOUS | Status: DC | PRN
  Administered 2017-06-25: 75 ug/kg/min via INTRAVENOUS

## 2017-06-25 MED ORDER — RIVAROXABAN 10 MG PO TABS
10.0000 mg | ORAL_TABLET | Freq: Every day | ORAL | Status: DC
  Administered 2017-06-26: 10 mg via ORAL
  Filled 2017-06-25: qty 1

## 2017-06-25 MED ORDER — BUPIVACAINE HCL (PF) 0.25 % IJ SOLN
INTRAMUSCULAR | Status: AC
  Filled 2017-06-25: qty 30

## 2017-06-25 MED ORDER — STERILE WATER FOR IRRIGATION IR SOLN
Status: DC | PRN
  Administered 2017-06-25: 2000 mL

## 2017-06-25 MED ORDER — PROPOFOL 10 MG/ML IV BOLUS
INTRAVENOUS | Status: DC | PRN
  Administered 2017-06-25 (×3): 30 mg via INTRAVENOUS

## 2017-06-25 MED ORDER — HYDROCODONE-ACETAMINOPHEN 7.5-325 MG PO TABS
1.0000 | ORAL_TABLET | ORAL | Status: DC | PRN
  Administered 2017-06-25 – 2017-06-26 (×3): 2 via ORAL
  Filled 2017-06-25 (×3): qty 2

## 2017-06-25 MED ORDER — BUPIVACAINE IN DEXTROSE 0.75-8.25 % IT SOLN
INTRATHECAL | Status: DC | PRN
  Administered 2017-06-25: 1.8 mL via INTRATHECAL

## 2017-06-25 MED ORDER — ROCURONIUM BROMIDE 10 MG/ML (PF) SYRINGE
PREFILLED_SYRINGE | INTRAVENOUS | Status: AC
  Filled 2017-06-25: qty 5

## 2017-06-25 MED ORDER — PROMETHAZINE HCL 25 MG/ML IJ SOLN: 6.2500 mg | INTRAMUSCULAR | Status: DC | PRN

## 2017-06-25 MED ORDER — ACETAMINOPHEN 325 MG PO TABS: 325.0000 mg | ORAL_TABLET | Freq: Four times a day (QID) | ORAL | Status: DC | PRN

## 2017-06-25 MED ORDER — PROPOFOL 10 MG/ML IV BOLUS
INTRAVENOUS | Status: AC
  Filled 2017-06-25: qty 40

## 2017-06-25 MED ORDER — TRANEXAMIC ACID 1000 MG/10ML IV SOLN
1000.0000 mg | Freq: Once | INTRAVENOUS | Status: AC
  Administered 2017-06-25: 1000 mg via INTRAVENOUS
  Filled 2017-06-25: qty 10

## 2017-06-25 MED ORDER — SUCCINYLCHOLINE CHLORIDE 200 MG/10ML IV SOSY
PREFILLED_SYRINGE | INTRAVENOUS | Status: AC
  Filled 2017-06-25: qty 10

## 2017-06-25 MED ORDER — MENTHOL 3 MG MT LOZG: 1.0000 | LOZENGE | OROMUCOSAL | Status: DC | PRN

## 2017-06-25 MED ORDER — HYDROCODONE-ACETAMINOPHEN 5-325 MG PO TABS
1.0000 | ORAL_TABLET | ORAL | Status: DC | PRN
  Administered 2017-06-25: 2 via ORAL
  Filled 2017-06-25: qty 2

## 2017-06-25 MED ORDER — ALBUMIN HUMAN 5 % IV SOLN
INTRAVENOUS | Status: AC
  Filled 2017-06-25: qty 250

## 2017-06-25 SURGICAL SUPPLY — 36 items
BAG DECANTER FOR FLEXI CONT (MISCELLANEOUS) ×2 IMPLANT
BAG SPEC THK2 15X12 ZIP CLS (MISCELLANEOUS)
BAG ZIPLOCK 12X15 (MISCELLANEOUS) IMPLANT
BLADE SAG 18X100X1.27 (BLADE) ×2 IMPLANT
CAPT HIP TOTAL 2 ×1 IMPLANT
CLOTH BEACON ORANGE TIMEOUT ST (SAFETY) ×2 IMPLANT
COVER PERINEAL POST (MISCELLANEOUS) ×2 IMPLANT
COVER SURGICAL LIGHT HANDLE (MISCELLANEOUS) ×2 IMPLANT
DECANTER SPIKE VIAL GLASS SM (MISCELLANEOUS) ×2 IMPLANT
DRAPE STERI IOBAN 125X83 (DRAPES) ×2 IMPLANT
DRAPE U-SHAPE 47X51 STRL (DRAPES) ×4 IMPLANT
DRSG ADAPTIC 3X8 NADH LF (GAUZE/BANDAGES/DRESSINGS) ×2 IMPLANT
DRSG MEPILEX BORDER 4X4 (GAUZE/BANDAGES/DRESSINGS) ×2 IMPLANT
DRSG MEPILEX BORDER 4X8 (GAUZE/BANDAGES/DRESSINGS) ×2 IMPLANT
DURAPREP 26ML APPLICATOR (WOUND CARE) ×2 IMPLANT
ELECT REM PT RETURN 15FT ADLT (MISCELLANEOUS) ×2 IMPLANT
EVACUATOR 1/8 PVC DRAIN (DRAIN) ×2 IMPLANT
GLOVE BIO SURGEON STRL SZ7.5 (GLOVE) ×1 IMPLANT
GLOVE BIO SURGEON STRL SZ8 (GLOVE) ×4 IMPLANT
GLOVE BIOGEL PI IND STRL 7.0 (GLOVE) IMPLANT
GLOVE BIOGEL PI IND STRL 8 (GLOVE) ×2 IMPLANT
GLOVE BIOGEL PI INDICATOR 7.0 (GLOVE) ×6
GLOVE BIOGEL PI INDICATOR 8 (GLOVE) ×2
GOWN STRL REUS W/TWL LRG LVL3 (GOWN DISPOSABLE) ×3 IMPLANT
GOWN STRL REUS W/TWL XL LVL3 (GOWN DISPOSABLE) ×4 IMPLANT
PACK ANTERIOR HIP CUSTOM (KITS) ×2 IMPLANT
STRIP CLOSURE SKIN 1/2X4 (GAUZE/BANDAGES/DRESSINGS) ×2 IMPLANT
SUT ETHIBOND NAB CT1 #1 30IN (SUTURE) ×2 IMPLANT
SUT MNCRL AB 4-0 PS2 18 (SUTURE) ×2 IMPLANT
SUT STRATAFIX 0 PDS 27 VIOLET (SUTURE) ×2
SUT VIC AB 2-0 CT1 27 (SUTURE) ×4
SUT VIC AB 2-0 CT1 TAPERPNT 27 (SUTURE) ×2 IMPLANT
SUTURE STRATFX 0 PDS 27 VIOLET (SUTURE) ×1 IMPLANT
SYR 50ML LL SCALE MARK (SYRINGE) IMPLANT
TRAY FOLEY W/METER SILVER 16FR (SET/KITS/TRAYS/PACK) ×2 IMPLANT
YANKAUER SUCT BULB TIP 10FT TU (MISCELLANEOUS) ×2 IMPLANT

## 2017-06-25 NOTE — Discharge Instructions (Addendum)
° °Dr. Frank Aluisio °Total Joint Specialist °[facility] °[address] °[place], [place] [address] °[phone] ° °ANTERIOR APPROACH TOTAL HIP REPLACEMENT POSTOPERATIVE DIRECTIONS ° ° °Hip Rehabilitation, Guidelines Following Surgery  °The results of a hip operation are greatly improved after range of motion and muscle strengthening exercises. Follow all safety measures which are given to protect your hip. If any of these exercises cause increased pain or swelling in your joint, decrease the amount until you are comfortable again. Then slowly increase the exercises. Call your caregiver if you have problems or questions.  ° °HOME CARE INSTRUCTIONS  °Remove items at home which could result in a fall. This includes throw rugs or furniture in walking pathways.  °· ICE to the affected hip every three hours for 30 minutes at a time and then as needed for pain and swelling.  Continue to use ice on the hip for pain and swelling from surgery. You may notice swelling that will progress down to the foot and ankle.  This is normal after surgery.  Elevate the leg when you are not up walking on it.   °· Continue to use the breathing machine which will help keep your temperature down.  It is common for your temperature to cycle up and down following surgery, especially at night when you are not up moving around and exerting yourself.  The breathing machine keeps your lungs expanded and your temperature down. ° ° °DIET °You may resume your previous home diet once your are discharged from the hospital. ° °DRESSING / WOUND CARE / SHOWERING °You may change your dressing every day with sterile gauze.  Please use good hand washing techniques before changing the dressing.  Do not use any lotions or creams on the incision until instructed by your surgeon. °You may start showering once you are discharged home but do not submerge the incision under water. Just pat the incision dry and apply a dry gauze dressing on  daily. °Change the surgical dressing daily and reapply a dry dressing each time. ° °ACTIVITY °Walk with your walker as instructed. °Use walker as long as suggested by your caregivers. °Avoid periods of inactivity such as sitting longer than an hour when not asleep. This helps prevent blood clots.  °You may resume a sexual relationship in one month or when given the OK by your doctor.  °You may return to work once you are cleared by your doctor.  °Do not drive a car for 6 weeks or until released by you surgeon.  °Do not drive while taking narcotics. ° °WEIGHT BEARING °Weight bearing as tolerated with assist device (walker, cane, etc) as directed, use it as long as suggested by your surgeon or therapist, typically at least 4-6 weeks. ° °POSTOPERATIVE CONSTIPATION PROTOCOL °Constipation - defined medically as fewer than three stools per week and severe constipation as less than one stool per week. ° °One of the most common issues patients have following surgery is constipation.  Even if you have a regular bowel pattern at home, your normal regimen is likely to be disrupted due to multiple reasons following surgery.  Combination of anesthesia, postoperative narcotics, change in appetite and fluid intake all can affect your bowels.  In order to avoid complications following surgery, here are some recommendations in order to help you during your recovery period. ° °Colace (docusate) - Pick up an over-the-counter form of Colace or another stool softener and take twice a day as long as you are requiring postoperative pain   medications.  Take with a full glass of water daily.  If you experience loose stools or diarrhea, hold the colace until you stool forms back up.  If your symptoms do not get better within 1 week or if they get worse, check with your doctor.  Dulcolax (bisacodyl) - Pick up over-the-counter and take as directed by the product packaging as needed to assist with the movement of your bowels.  Take with a full  glass of water.  Use this product as needed if not relieved by Colace only.   MiraLax (polyethylene glycol) - Pick up over-the-counter to have on hand.  MiraLax is a solution that will increase the amount of water in your bowels to assist with bowel movements.  Take as directed and can mix with a glass of water, juice, soda, coffee, or tea.  Take if you go more than two days without a movement. Do not use MiraLax more than once per day. Call your doctor if you are still constipated or irregular after using this medication for 7 days in a row.  If you continue to have problems with postoperative constipation, please contact the office for further assistance and recommendations.  If you experience "the worst abdominal pain ever" or develop nausea or vomiting, please contact the office immediatly for further recommendations for treatment.  ITCHING  If you experience itching with your medications, try taking only a single pain pill, or even half a pain pill at a time.  You can also use Benadryl over the counter for itching or also to help with sleep.   TED HOSE STOCKINGS Wear the elastic stockings on both legs for three weeks following surgery during the day but you may remove then at night for sleeping.  MEDICATIONS See your medication summary on the After Visit Summary that the nursing staff will review with you prior to discharge.  You may have some home medications which will be placed on hold until you complete the course of blood thinner medication.  It is important for you to complete the blood thinner medication as prescribed by your surgeon.  Continue your approved medications as instructed at time of discharge.  Take Xarelto for two and a half more weeks following discharge from the hospital, then discontinue Xarelto. Once the patient has completed the blood thinner regimen, then take a Baby 81 mg Aspirin daily for three more weeks.  PRECAUTIONS If you experience chest pain or shortness  of breath - call 911 immediately for transfer to the hospital emergency department.  If you develop a fever greater that 101 F, purulent drainage from wound, increased redness or drainage from wound, foul odor from the wound/dressing, or calf pain - CONTACT YOUR SURGEON.                                                   FOLLOW-UP APPOINTMENTS Make sure you keep all of your appointments after your operation with your surgeon and caregivers. You should call the office at the above phone number and make an appointment for approximately two weeks after the date of your surgery or on the date instructed by your surgeon outlined in the "After Visit Summary".  RANGE OF MOTION AND STRENGTHENING EXERCISES  These exercises are designed to help you keep full movement of your hip joint. Follow your caregiver's or physical therapist's instructions.  Perform all exercises about fifteen times, three times per day or as directed. Exercise both hips, even if you have had only one joint replacement. These exercises can be done on a training (exercise) mat, on the floor, on a table or on a bed. Use whatever works the best and is most comfortable for you. Use music or television while you are exercising so that the exercises are a pleasant break in your day. This will make your life better with the exercises acting as a break in routine you can look forward to.  Lying on your back, slowly slide your foot toward your buttocks, raising your knee up off the floor. Then slowly slide your foot back down until your leg is straight again.  Lying on your back spread your legs as far apart as you can without causing discomfort.  Lying on your side, raise your upper leg and foot straight up from the floor as far as is comfortable. Slowly lower the leg and repeat.  Lying on your back, tighten up the muscle in the front of your thigh (quadriceps muscles). You can do this by keeping your leg straight and trying to raise your heel off the  floor. This helps strengthen the largest muscle supporting your knee.  Lying on your back, tighten up the muscles of your buttocks both with the legs straight and with the knee bent at a comfortable angle while keeping your heel on the floor.   IF YOU ARE TRANSFERRED TO A SKILLED REHAB FACILITY If the patient is transferred to a skilled rehab facility following release from the hospital, a list of the current medications will be sent to the facility for the patient to continue.  When discharged from the skilled rehab facility, please have the facility set up the patient's [place] prior to being released. Also, the skilled facility will be responsible for providing the patient with their medications at time of release from the facility to include their pain medication, the muscle relaxants, and their blood thinner medication. If the patient is still at the rehab facility at time of the two week follow up appointment, the skilled rehab facility will also need to assist the patient in arranging follow up appointment in our office and any transportation needs.  MAKE SURE YOU:  Understand these instructions.  Get help right away if you are not doing well or get worse.    Pick up stool softner and laxative for home use following surgery while on pain medications. Do not submerge incision under water. Please use good hand washing techniques while changing dressing each day. May shower starting three days after surgery. Please use a clean towel to pat the incision dry following showers. Continue to use ice for pain and swelling after surgery. Do not use any lotions or creams on the incision until instructed by your surgeon.  _______________________________________________  Information on my medicine - XARELTO (Rivaroxaban)  This medication education was reviewed with me or my healthcare representative as part of my discharge preparation.    Why was Xarelto prescribed for  you? Xarelto was prescribed for you to reduce the risk of blood clots forming after orthopedic surgery. The medical term for these abnormal blood clots is venous thromboembolism (VTE).  What do you need to know about xarelto ? Take your Xarelto ONCE DAILY at the same time every day. You may take it either with or without food.  If you have difficulty swallowing the tablet whole, you may crush  crush it and mix in applesauce just prior to taking your dose. ° °Take Xarelto® exactly as prescribed by your doctor and DO NOT stop taking Xarelto® without talking to the doctor who prescribed the medication.  Stopping without other VTE prevention medication to take the place of Xarelto® may increase your risk of developing a clot. ° °After discharge, you should have regular check-up appointments with your healthcare provider that is prescribing your Xarelto®.   ° °What do you do if you miss a dose? °If you miss a dose, take it as soon as you remember on the same day then continue your regularly scheduled once daily regimen the next day. Do not take two doses of Xarelto® on the same day.  ° °Important Safety Information °A possible side effect of Xarelto® is bleeding. You should call your healthcare provider right away if you experience any of the following: °? Bleeding from an injury or your nose that does not stop. °? Unusual colored urine (red or dark brown) or unusual colored stools (red or black). °? Unusual bruising for unknown reasons. °? A serious fall or if you hit your head (even if there is no bleeding). ° °Some medicines may interact with Xarelto® and might increase your risk of bleeding while on Xarelto®. To help avoid this, consult your healthcare provider or pharmacist prior to using any new prescription or non-prescription medications, including herbals, vitamins, non-steroidal anti-inflammatory drugs (NSAIDs) and supplements. ° °This website has more information on Xarelto®: www.xarelto.com. ° ° ° °

## 2017-06-25 NOTE — Anesthesia Postprocedure Evaluation (Signed)
Anesthesia Post Note  Patient: Juan Snow  Procedure(s) Performed: LEFT TOTAL HIP ARTHROPLASTY ANTERIOR APPROACH (Left Hip)     Patient location during evaluation: PACU Anesthesia Type: Spinal Level of consciousness: oriented and awake and alert Pain management: pain level controlled Vital Signs Assessment: post-procedure vital signs reviewed and stable Respiratory status: spontaneous breathing, respiratory function stable and nonlabored ventilation Cardiovascular status: blood pressure returned to baseline and stable Postop Assessment: no headache, no backache, no apparent nausea or vomiting and spinal receding Anesthetic complications: no    Last Vitals:  Vitals:   06/25/17 1545 06/25/17 1600  BP: 109/75 110/80  Pulse: (!) 54 (!) 51  Resp: 16 16  Temp: (!) 36.2 C   SpO2: 100% 100%    Last Pain:  Vitals:   06/25/17 1530  TempSrc:   PainSc: 0-No pain                 Oliviya Gilkison A.

## 2017-06-25 NOTE — Transfer of Care (Signed)
Immediate Anesthesia Transfer of Care Note  Patient: Juan Snow  Procedure(s) Performed: LEFT TOTAL HIP ARTHROPLASTY ANTERIOR APPROACH (Left Hip)  Patient Location: PACU  Anesthesia Type:Spinal  Level of Consciousness: awake  Airway & Oxygen Therapy: Patient Spontanous Breathing and Patient connected to face mask oxygen  Post-op Assessment: Report given to RN and Post -op Vital signs reviewed and stable  Post vital signs: reviewed and stable  Last Vitals:  Vitals Value Taken Time  BP    Temp    Pulse 56 06/25/2017  3:05 PM  Resp 19 06/25/2017  3:05 PM  SpO2 100 % 06/25/2017  3:05 PM  Vitals shown include unvalidated device data.  Last Pain:  Vitals:   06/25/17 1205  TempSrc: Oral      Patients Stated Pain Goal: 4 (44/97/53 0051)  Complications: No apparent anesthesia complications

## 2017-06-25 NOTE — Interval H&P Note (Signed)
History and Physical Interval Note:  06/25/2017 1:09 PM  Juan Snow  has presented today for surgery, with the diagnosis of Left hip osteoarthritis  The various methods of treatment have been discussed with the patient and family. After consideration of risks, benefits and other options for treatment, the patient has consented to  Procedure(s): LEFT TOTAL HIP ARTHROPLASTY ANTERIOR APPROACH (Left) as a surgical intervention .  The patient's history has been reviewed, patient examined, no change in status, stable for surgery.  I have reviewed the patient's chart and labs.  Questions were answered to the patient's satisfaction.     Pilar Plate Rayen Palen

## 2017-06-25 NOTE — Anesthesia Procedure Notes (Signed)
Date/Time: 06/25/2017 1:16 PM Performed by: Cynda Familia, CRNA Pre-anesthesia Checklist: Patient identified, Emergency Drugs available, Suction available, Patient being monitored and Timeout performed Patient Re-evaluated:Patient Re-evaluated prior to induction Oxygen Delivery Method: Simple face mask Placement Confirmation: positive ETCO2 and breath sounds checked- equal and bilateral Dental Injury: Teeth and Oropharynx as per pre-operative assessment

## 2017-06-25 NOTE — Anesthesia Procedure Notes (Signed)
Date/Time: 06/25/2017 1:45 PM Performed by: Cynda Familia, CRNA Ventilation: Oral airway inserted - appropriate to patient size Tube type: Oral Placement Confirmation: positive ETCO2 and breath sounds checked- equal and bilateral Comments: Nasal AW orally with ETT adaptor  To drager with PS--- spont RR-- with O 2 mask -- to RX obstruction

## 2017-06-25 NOTE — Op Note (Signed)
OPERATIVE REPORT- TOTAL HIP ARTHROPLASTY   PREOPERATIVE DIAGNOSIS: Osteoarthritis of the Left hip.   POSTOPERATIVE DIAGNOSIS: Osteoarthritis of the Left  hip.   PROCEDURE: Left total hip arthroplasty, anterior approach.   SURGEON: Gaynelle Arabian, MD   ASSISTANT: Molli Barrows, PA-C  ANESTHESIA:  Spinal  ESTIMATED BLOOD LOSS:-300 mL    DRAINS: Hemovac x1.   COMPLICATIONS: None   CONDITION: PACU - hemodynamically stable.   BRIEF CLINICAL NOTE: Juan Snow is a 62 y.o. male who has advanced end-  stage arthritis of their Left  hip with progressively worsening pain and  dysfunction.The patient has failed nonoperative management and presents for  total hip arthroplasty.   PROCEDURE IN DETAIL: After successful administration of spinal  anesthetic, the traction boots for the [facility] bed were placed on both  feet and the patient was placed onto the [facility] bed, boots placed into the leg  holders. The Left hip was then isolated from the perineum with plastic  drapes and prepped and draped in the usual sterile fashion. ASIS and  greater trochanter were marked and a oblique incision was made, starting  at about 1 cm lateral and 2 cm distal to the ASIS and coursing towards  the anterior cortex of the femur. The skin was cut with a 10 blade  through subcutaneous tissue to the level of the fascia overlying the  tensor fascia lata muscle. The fascia was then incised in line with the  incision at the junction of the anterior third and posterior 2/3rd. The  muscle was teased off the fascia and then the interval between the TFL  and the rectus was developed. The Hohmann retractor was then placed at  the top of the femoral neck over the capsule. The vessels overlying the  capsule were cauterized and the fat on top of the capsule was removed.  A Hohmann retractor was then placed anterior underneath the rectus  femoris to give exposure to the entire anterior capsule. A T-shaped   capsulotomy was performed. The edges were tagged and the femoral head  was identified.       Osteophytes are removed off the superior acetabulum.  The femoral neck was then cut in situ with an oscillating saw. Traction  was then applied to the left lower extremity utilizing the [facility]  traction. The femoral head was then removed. Retractors were placed  around the acetabulum and then circumferential removal of the labrum was  performed. Osteophytes were also removed. Reaming starts at 47 mm to  medialize and  Increased in 2 mm increments to 53 mm. We reamed in  approximately 40 degrees of abduction, 20 degrees anteversion. A 54 mm  pinnacle acetabular shell was then impacted in anatomic position under  fluoroscopic guidance with excellent purchase. We did not need to place  any additional dome screws. A 36 mm neutral + 4 marathon liner was then  placed into the acetabular shell.       The femoral lift was then placed along the lateral aspect of the femur  just distal to the vastus ridge. The leg was  externally rotated and capsule  was stripped off the inferior aspect of the femoral neck down to the  level of the lesser trochanter, this was done with electrocautery. The femur was lifted after this was performed. The  leg was then placed in an extended and adducted position essentially delivering the femur. We also removed the capsule superiorly and the piriformis from the piriformis  fossa to gain excellent exposure of the  proximal femur. Rongeur was used to remove some cancellous bone to get  into the lateral portion of the proximal femur for placement of the  initial starter reamer. The starter broaches was placed  the starter broach  and was shown to go down the center of the canal. Broaching  with the  Corail system was then performed starting at size 8, coursing  Up to size 10. A size 10 had excellent torsional and rotational  and axial stability. The trial standard offset neck was then  placed  with a 36 + 8.5 trial head. The hip was then reduced. We confirmed that  the stem was in the canal both on AP and lateral x-rays. It also has excellent sizing. The hip was reduced with outstanding stability through full extension and full external rotation.. AP pelvis was taken and the leg lengths were measured and found to be equal. Hip was then dislocated again and the femoral head and neck removed. The  femoral broach was removed. Size 10 Corail stem with a standard offset  neck was then impacted into the femur following native anteversion. Has  excellent purchase in the canal. Excellent torsional and rotational and  axial stability. It is confirmed to be in the canal on AP and lateral  fluoroscopic views. The 36 + 8.5 ceramic head was placed and the hip  reduced with outstanding stability. Again AP pelvis was taken and it  confirmed that the leg lengths were equal. The wound was then copiously  irrigated with saline solution and the capsule reattached and repaired  with Ethibond suture. 30 ml of .25% Bupivicaine was  injected into the capsule and into the edge of the tensor fascia lata as well as subcutaneous tissue. The fascia overlying the tensor fascia lata was then closed with a running #1 V-Loc. Subcu was closed with interrupted 2-0 Vicryl and subcuticular running 4-0 Monocryl. Incision was cleaned  and dried. Steri-Strips and a bulky sterile dressing applied. Hemovac  drain was hooked to suction and then the patient was awakened and transported to  recovery in stable condition.        Please note that a surgical assistant was a medical necessity for this procedure to perform it in a safe and expeditious manner. Assistant was necessary to provide appropriate retraction of vital neurovascular structures and to prevent femoral fracture and allow for anatomic placement of the prosthesis.  Gaynelle Arabian, M.D.

## 2017-06-25 NOTE — Anesthesia Preprocedure Evaluation (Addendum)
Anesthesia Evaluation  Patient identified by MRN, date of birth, ID band Patient awake    Reviewed: Allergy & Precautions, NPO status , Patient's Chart, lab work & pertinent test results, reviewed documented beta blocker date and time   Airway Mallampati: II  TM Distance: >3 FB Neck ROM: Full    Dental  (+) Partial Lower, Dental Advisory Given   Pulmonary    Pulmonary exam normal breath sounds clear to auscultation       Cardiovascular negative cardio ROS Normal cardiovascular exam Rhythm:Regular Rate:Normal     Neuro/Psych negative neurological ROS  negative psych ROS   GI/Hepatic Neg liver ROS, GERD  Medicated and Controlled,  Endo/Other  negative endocrine ROS  Renal/GU negative Renal ROS  negative genitourinary   Musculoskeletal  (+) Arthritis , Osteoarthritis,  Left Hip OA   Abdominal   Peds  Hematology   Anesthesia Other Findings   Reproductive/Obstetrics                            Anesthesia Physical Anesthesia Plan  ASA: II  Anesthesia Plan: Spinal   Post-op Pain Management:    Induction:   PONV Risk Score and Plan: 3, 2 and 4 or greater and Treatment may vary due to age or medical condition, Ondansetron and Propofol infusion  Airway Management Planned: Natural Airway, Nasal Cannula and Simple Face Mask  Additional Equipment:   Intra-op Plan:   Post-operative Plan:   Informed Consent: I have reviewed the patients History and Physical, chart, labs and discussed the procedure including the risks, benefits and alternatives for the proposed anesthesia with the patient or authorized representative who has indicated his/her understanding and acceptance.   Dental advisory given  Plan Discussed with: Anesthesiologist, CRNA and Surgeon  Anesthesia Plan Comments:         Anesthesia Quick Evaluation

## 2017-06-25 NOTE — Anesthesia Procedure Notes (Signed)
Spinal  Patient location during procedure: OR Start time: 06/25/2017 1:26 PM Staffing Anesthesiologist: Josephine Igo, MD Performed: anesthesiologist  Preanesthetic Checklist Completed: patient identified, site marked, surgical consent, pre-op evaluation, timeout performed, IV checked, risks and benefits discussed and monitors and equipment checked Spinal Block Patient position: sitting Prep: site prepped and draped and DuraPrep Patient monitoring: heart rate, cardiac monitor, continuous pulse ox and blood pressure Approach: midline Location: L3-4 Injection technique: single-shot Needle Needle type: Pencan  Needle gauge: 24 G Needle length: 9 cm Needle insertion depth: 6 cm Assessment Sensory level: T4 Additional Notes Patient tolerated procedure well. Adequate sensory level.

## 2017-06-26 ENCOUNTER — Encounter (HOSPITAL_COMMUNITY): Payer: Self-pay | Admitting: Orthopedic Surgery

## 2017-06-26 LAB — BASIC METABOLIC PANEL
ANION GAP: 11 (ref 5–15)
BUN: 11 mg/dL (ref 6–20)
CALCIUM: 9 mg/dL (ref 8.9–10.3)
CO2: 23 mmol/L (ref 22–32)
Chloride: 105 mmol/L (ref 101–111)
Creatinine, Ser: 1.08 mg/dL (ref 0.61–1.24)
GFR calc Af Amer: >60 mL/min (ref >60)
GFR calc non Af Amer: >60 mL/min (ref >60)
GLUCOSE: 126 mg/dL — AB (ref 65–99)
POTASSIUM: 4.2 mmol/L (ref 3.5–5.1)
Sodium: 139 mmol/L (ref 135–145)

## 2017-06-26 LAB — CBC
HEMATOCRIT: 39.1 % (ref 39.0–52.0)
Hemoglobin: 13.3 g/dL (ref 13.0–17.0)
MCH: 30.4 pg (ref 26.0–34.0)
MCHC: 34 g/dL (ref 30.0–36.0)
MCV: 89.5 fL (ref 78.0–100.0)
Platelets: 220 10*3/uL (ref 150–400)
RBC: 4.37 MIL/uL (ref 4.22–5.81)
RDW: 13.3 % (ref 11.5–15.5)
WBC: 14.7 10*3/uL — ABNORMAL HIGH (ref 4.0–10.5)

## 2017-06-26 MED ORDER — HYDROCODONE-ACETAMINOPHEN 7.5-325 MG PO TABS: 1.0000 | ORAL_TABLET | ORAL | 0 refills | Status: DC | PRN

## 2017-06-26 MED ORDER — TRAMADOL HCL 50 MG PO TABS: 50.0000 mg | ORAL_TABLET | Freq: Four times a day (QID) | ORAL | 0 refills | Status: DC | PRN

## 2017-06-26 MED FILL — HYDROCODON-APAP 7.5-325: 7.5-325 | 7 days supply | Qty: 40 | Fill #0

## 2017-06-26 MED FILL — traMADol HCL 50 MG TABS: 50 | 5 days supply | Qty: 40 | Fill #0

## 2017-06-26 MED FILL — XARELTO 10 MG TABLET: 10 | 20 days supply | Qty: 20 | Fill #0

## 2017-06-26 MED FILL — METHOCARBAMOL 500 MG TABS: 500 | 10 days supply | Qty: 40 | Fill #0

## 2017-06-26 NOTE — Progress Notes (Signed)
  RN reviewed discharge instructions with patient and family. All questions answered.   Paperwork and prescriptions.   NT rolled patient down with all belongings to family car.

## 2017-06-26 NOTE — Progress Notes (Signed)
   06/26/17 1519  PT Visit Information  Last PT Received On 06/26/17  Assistance Needed +1  History of Present Illness s/p L THA  Subjective Data  Subjective pt feeling well, motivated to d/c home  Patient Stated Goal back to playing golf  Precautions  Precautions Fall  Pain Assessment  Pain Assessment 0-10  Pain Score 3  Pain Location L hip   Pain Descriptors / Indicators Sore;Discomfort  Pain Intervention(s) Monitored during session;Premedicated before session  Cognition  Arousal/Alertness Awake/alert  Behavior During Therapy WFL for tasks assessed/performed  Overall Cognitive Status Within Functional Limits for tasks assessed  Transfers  Overall transfer level Needs assistance  Equipment used Rolling walker (2 wheeled)  Transfers Sit to/from Stand  Sit to Stand Min guard;Supervision  General transfer comment cues for hand placement, pt self corrects  Ambulation/Gait  Ambulation/Gait assistance Min guard;Supervision  Ambulation Distance (Feet) 120 Feet  Assistive device Rolling walker (2 wheeled)  Gait Pattern/deviations Step-to pattern;Step-through pattern  General Gait Details cues for gait progression and RW safety; improved wt shift to LLE  Stairs Yes  Stairs assistance Min guard  Stair Management One rail Right;One rail Left;Step to pattern;Forwards;With crutches  Number of Stairs 5  General stair comments cues for sequence  PT - End of Session  Equipment Utilized During Treatment Gait belt  Activity Tolerance Patient tolerated treatment well  Patient left in chair;with call bell/phone within reach;with family/visitor present   PT - Assessment/Plan  PT Plan Current plan remains appropriate  PT Visit Diagnosis Difficulty in walking, not elsewhere classified (R26.2)  PT Frequency (ACUTE ONLY) 7X/week  Follow Up Recommendations Home health PT  PT equipment Rolling walker with 5" wheels;3in1 (PT)  AM-PAC PT "6 Clicks" Daily Activity Outcome Measure  Difficulty  turning over in bed (including adjusting bedclothes, sheets and blankets)? 1  Difficulty moving from lying on back to sitting on the side of the bed?  1  Difficulty sitting down on and standing up from a chair with arms (e.g., wheelchair, bedside commode, etc,.)? 3  Help needed moving to and from a bed to chair (including a wheelchair)? 3  Help needed walking in hospital room? 3  Help needed climbing 3-5 steps with a railing?  3  6 Click Score 14  Mobility G Code  CK  PT Goal Progression  Progress towards PT goals Progressing toward goals  Acute Rehab PT Goals  PT Goal Formulation With patient  Time For Goal Achievement 07/03/17  Potential to Achieve Goals Good  PT Time Calculation  PT Start Time (ACUTE ONLY) 1448  PT Stop Time (ACUTE ONLY) 1503  PT Time Calculation (min) (ACUTE ONLY) 15 min  PT General Charges  $$ ACUTE PT VISIT 1 Visit  PT Treatments  $Gait Training 8-22 mins

## 2017-06-26 NOTE — Progress Notes (Signed)
Discharge planning, spoke with patient and spouse at bedside. Have chosen [facility] for HH PT, evaluate and treat. Contacted [facility] for referral. Needs RW and 3n1, contacted [facility] to deliver to room. [phone] 

## 2017-06-26 NOTE — Discharge Summary (Signed)
Physician Discharge Summary   Patient ID: Juan Snow MRN: [id number] DOB/AGE: 61/61/1957 61 y.o.  Admit date: 06/25/2017 Discharge date: 06/26/2017  Primary Diagnosis: Primary osteoarthritis left hip   Admission Diagnoses:  Past Medical History:  Diagnosis Date  . Arthritis    OA  . Cough    WHITE SPUTUM OCC, NO FEVER COLD SYMPTOMS MAINLY  . GERD (gastroesophageal reflux disease)    Discharge Diagnoses:   Principal Problem:   OA (osteoarthritis) of hip  Estimated body mass index is 27.78 kg/m as calculated from the following:   Height as of this encounter: 5' 7.5" (1.715 m).   Weight as of this encounter: 81.6 kg (180 lb).  Procedure(s) (LRB): LEFT TOTAL HIP ARTHROPLASTY ANTERIOR APPROACH (Left)   Consults: None  HPI: Juan Snow has been seen for ongoing left hip pain onset about a year ago, with no injury. He reports lateral hip pain as well as groin pain. He does have some pain going down his leg at times.He reports stiffness and popping in his hip. He has previously been evaluated by Dr. Vertell Limber, due to some left sided sciatic pain. He also saw Dr. Percell Miller about a year ago. He did have some recent x-rays of his back and hip at Dr. Melven Sartorius office; those are available on the [facility] system. He reports that he had an intra-articular injection on Friday February 1st. He states that this is the actually the second injection he has had. He does feel like the first injection was more beneficial at this point, however, he has gotten some temporary relief with the most recent injection. He is taking meloxicam for pain. No history of significant steroid exposure or alcohol abuse. Have any history of trauma to the hip.  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 He has had 2 injections in his left hip the last one about a month or about a week ago which has not helped at all the one that he  had last year helped. With the collapse and flattening of his head and bone-on-bone findings really the only thing that can help his total hip arthroplasty. We discussed total hip arthroplasty by anterior approach wrist benefits and aftercare and he is agreeable with this and wants to proceed.    Laboratory Data: Admission on 06/25/2017  Component Date Value Ref Range Status  . WBC 06/26/2017 14.7* 4.0 - 10.5 K/uL Final  . RBC 06/26/2017 4.37  4.22 - 5.81 MIL/uL Final  . Hemoglobin 06/26/2017 13.3  13.0 - 17.0 g/dL Final  . HCT 06/26/2017 39.1  39.0 - 52.0 % Final  . MCV 06/26/2017 89.5  78.0 - 100.0 fL Final  . MCH 06/26/2017 30.4  26.0 - 34.0 pg Final  . MCHC 06/26/2017 34.0  30.0 - 36.0 g/dL Final  . RDW 06/26/2017 13.3  11.5 - 15.5 % Final  . Platelets 06/26/2017 220  150 - 400 K/uL Final   Performed at [facility], [place] [address], [place], [place] [address]  . Sodium 06/26/2017 139  135 - 145 mmol/L Final  . Potassium 06/26/2017 4.2  3.5 - 5.1 mmol/L Final  . Chloride 06/26/2017 105  101 - 111 mmol/L Final  . CO2 06/26/2017 23  22 - 32 mmol/L Final  . Glucose, Bld 06/26/2017 126* 65 - 99 mg/dL Final  . BUN 06/26/2017 11  6 - 20 mg/dL Final  . Creatinine, Ser  06/26/2017 1.08  0.61 - 1.24 mg/dL Final  . Calcium 06/26/2017 9.0  8.9 - 10.3 mg/dL Final  . GFR calc non Af Amer 06/26/2017 >60  >60 mL/min Final  . GFR calc Af Amer 06/26/2017 >60  >60 mL/min Final   Comment: (NOTE) The eGFR has been calculated using the CKD EPI equation. This calculation has not been validated in all clinical situations. eGFR's persistently <60 mL/min signify possible Chronic Kidney Disease.   Georgiann Hahn gap 06/26/2017 11  5 - 15 Final   Performed at [facility], [place] [address], [place], [place] [address]  Hospital Outpatient Visit on 06/18/2017  Component Date Value Ref Range Status  . aPTT 06/18/2017 32  24 - 36 seconds Final   Performed at [facility], [place] [address], [place], [place] [address]  . Prothrombin Time 06/18/2017 12.4  11.4 - 15.2 seconds Final  . INR 06/18/2017 0.93   Final   Performed at [facility], [place] [address] [place], [place] [address]  . ABO/RH(D) 06/18/2017 B POS   Final  . Antibody Screen 06/18/2017 NEG   Final  . Sample Expiration 06/18/2017 06/28/2017   Final  . Extend sample reason 06/18/2017    Final                   Value:NO TRANSFUSIONS OR PREGNANCY IN THE PAST 3 MONTHS Performed at [facility], [place] [address] [place] [address], [place], [place] [address]   . MRSA, PCR 06/18/2017 NEGATIVE  NEGATIVE Final  . Staphylococcus aureus 06/18/2017 NEGATIVE  NEGATIVE Final   Comment: (NOTE) The Xpert SA Assay (FDA approved for NASAL specimens in patients 65 years of age and older), is one component of a comprehensive surveillance program. It is not intended to diagnose infection nor to guide or monitor treatment. Performed at [facility], [place] [address] [place], [place] [address]   . ABO/RH(D) 06/18/2017    Final                   Value:B POS Performed at [facility], [place] [address] [place],  [address]      X-Rays:Dg Pelvis Portable  Result Date: 06/25/2017 CLINICAL DATA:  Status post left hip replacement. EXAM: PORTABLE PELVIS 1-2 VIEWS COMPARISON:  Fluoroscopic images of same day. FINDINGS: The femoral and acetabular components appear to be well situated. No fracture or dislocation is noted. Expected postoperative changes are noted in the surrounding soft tissues. IMPRESSION: Status post left total hip arthroplasty. Electronically Signed   By: Marijo Conception, M.D.   On: 06/25/2017 15:50   Dg C-arm 1-60 Min-no Report  Result Date: 06/25/2017 Fluoroscopy was utilized by the requesting physician.  No radiographic interpretation.     Hospital Course: Patient was admitted to [facility] and taken to the OR  and underwent the above state procedure without complications.  Patient tolerated the procedure well and was later transferred to the recovery room and then to the orthopaedic floor for postoperative care.  They were given PO and IV analgesics for pain control following their surgery.  They were given 24 hours of postoperative antibiotics of  Anti-infectives (From admission, onward)   Start     Dose/Rate Route Frequency Ordered Stop   06/25/17 1930  ceFAZolin (ANCEF) IVPB 2g/100 mL premix     2 g 200 mL/hr over 30 Minutes Intravenous Every 6 hours 06/25/17 1649 06/26/17 0240   06/25/17 1157  ceFAZolin (ANCEF) IVPB 2g/100 mL premix     2 g 200 mL/hr over 30 Minutes Intravenous On call to O.R. 06/25/17 1157 06/25/17 1327     and started on DVT prophylaxis in the form of Xarelto.   PT and OT were ordered for total hip protocol.  The patient was allowed to be WBAT with therapy. Discharge planning was consulted to help with postop disposition and equipment needs.  Patient had a good night on the evening of surgery.  They started to get up OOB with therapy on day one.  Hemovac drain was pulled without difficulty. The patient had progressed with therapy and meeting their goals.  Incision was healing well.  Patient was seen in rounds and was ready to go home.  Diet: Cardiac diet Activity:WBAT Follow-up:in 2 weeks Disposition - Home Discharged Condition: stable   Discharge Instructions    Call MD / Call 911   Complete by:  As directed    If you experience chest pain or shortness of breath, CALL 911 and be transported to the hospital emergency room.  If you develope a fever above 101 F, pus (white drainage) or increased drainage or redness at the wound, or calf pain, call your surgeon's office.   Constipation Prevention   Complete by:  As directed    Drink plenty of fluids.  Prune juice may be helpful.  You may use a stool softener, such as Colace (over the counter) 100 mg twice a day.  Use MiraLax  (over the counter) for constipation as needed.   Diet - low sodium heart healthy   Complete by:  As directed    Discharge instructions   Complete by:  As directed    Dr. Gaynelle Arabian Total Joint Specialist [facility] [address], [place], [place] [address] [phone]  ANTERIOR APPROACH TOTAL HIP REPLACEMENT POSTOPERATIVE DIRECTIONS   Hip Rehabilitation, Guidelines Following Surgery  The results of a hip operation are greatly improved after range of motion and muscle strengthening exercises. Follow all safety measures which are given to protect your hip. If any of these exercises cause increased pain or swelling in your joint, decrease the amount until you are comfortable again. Then slowly increase the exercises. Call your caregiver if you have problems or questions.   HOME CARE INSTRUCTIONS  Remove items at home which could result in a fall. This includes throw rugs or furniture in walking pathways.  ICE to the affected hip every three hours for 30 minutes at a time and then as needed for pain and swelling.  Continue to use ice on the hip for pain and swelling from surgery. You may notice swelling that will progress down to the foot and ankle.  This is normal after surgery.  Elevate the leg when you are not up walking on it.   Continue to use the breathing machine which will help keep your temperature down.  It is common for your temperature to cycle up and down following surgery, especially at night when you are not up moving around and exerting yourself.  The breathing machine keeps your lungs expanded and your temperature down.   DIET You may resume your previous home diet once your are discharged from the hospital.  DRESSING / WOUND CARE / SHOWERING You may change your dressing every day with sterile gauze.  Please use good hand washing techniques before changing the dressing.  Do not use any lotions or creams on the incision until instructed by your surgeon.  You  may start showering once you are discharged home but do not submerge the incision under water. Just pat the incision dry and apply a dry gauze dressing on daily. Change the surgical dressing daily and reapply a dry dressing each time.  ACTIVITY Walk with your walker as instructed. Use walker as long as suggested by your caregivers. Avoid periods of inactivity such as sitting longer than an hour when not asleep. This helps prevent blood clots.  You may resume a sexual relationship in one month or when given the OK by your doctor.  You may return to work once you are cleared by your doctor.  Do not drive a car for 6 weeks or until released by you surgeon.  Do not drive while taking narcotics.  WEIGHT BEARING Weight bearing as tolerated with assist device (walker, cane, etc) as directed, use it as long as suggested by your surgeon or therapist, typically at least 4-6 weeks.  POSTOPERATIVE CONSTIPATION PROTOCOL Constipation - defined medically as fewer than three stools per week and severe constipation as less than one stool per week.  One of the most common issues patients have following surgery is constipation.  Even if you have a regular bowel pattern at home, your normal regimen is likely to be disrupted due to multiple reasons following surgery.  Combination of anesthesia, postoperative narcotics, change in appetite and fluid intake all can affect your bowels.  In order to avoid complications following surgery, here are some recommendations in order to help you during your recovery period.  Colace (docusate) - Pick up an over-the-counter form of Colace or another stool softener and take twice a day as long as you are requiring postoperative pain medications.  Take with a full glass of water daily.  If you experience loose stools or diarrhea, hold the colace until you stool forms back up.  If your symptoms do not get better within 1 week or if they get worse, check with your doctor.  Dulcolax  (bisacodyl) - Pick up over-the-counter and take as directed by the product packaging as needed to assist with the movement of your bowels.  Take with a full glass of water.  Use this product as needed if not relieved by Colace only.   MiraLax (polyethylene glycol) - Pick up over-the-counter to have on hand.  MiraLax is a solution that will increase the amount of water in your bowels to assist with bowel movements.  Take as directed and can mix with a glass of water, juice, soda, coffee, or tea.  Take if you go more than two days without a movement. Do not use MiraLax more than once per day. Call your doctor if you are still constipated or irregular after using this medication for 7 days in a row.  If you continue to have problems with postoperative constipation, please contact the office for further assistance and recommendations.  If you experience "the worst abdominal pain ever" or develop nausea or vomiting, please contact the office immediatly for further recommendations for treatment.  ITCHING  If you experience itching with your medications, try taking only a single pain pill, or even half a pain pill at a time.  You can also use Benadryl over the counter for itching or also to help with sleep.   TED HOSE STOCKINGS Wear the elastic stockings on both legs for three weeks following surgery during the day but you may remove then at night for sleeping.  MEDICATIONS See your medication summary on the "After  Visit Summary" that the nursing staff will review with you prior to discharge.  You may have some home medications which will be placed on hold until you complete the course of blood thinner medication.  It is important for you to complete the blood thinner medication as prescribed by your surgeon.  Continue your approved medications as instructed at time of discharge.  Take Xarelto for two and a half more weeks following discharge from the hospital, then discontinue Xarelto. Once the patient  has completed the blood thinner regimen, then take a Baby 81 mg Aspirin daily for three more weeks.  PRECAUTIONS If you experience chest pain or shortness of breath - call 911 immediately for transfer to the hospital emergency department.  If you develop a fever greater that 101 F, purulent drainage from wound, increased redness or drainage from wound, foul odor from the wound/dressing, or calf pain - CONTACT YOUR SURGEON.                                                   FOLLOW-UP APPOINTMENTS Make sure you keep all of your appointments after your operation with your surgeon and caregivers. You should call the office at the above phone number and make an appointment for approximately two weeks after the date of your surgery or on the date instructed by your surgeon outlined in the "After Visit Summary".  RANGE OF MOTION AND STRENGTHENING EXERCISES  These exercises are designed to help you keep full movement of your hip joint. Follow your caregiver's or physical therapist's instructions. Perform all exercises about fifteen times, three times per day or as directed. Exercise both hips, even if you have had only one joint replacement. These exercises can be done on a training (exercise) mat, on the floor, on a table or on a bed. Use whatever works the best and is most comfortable for you. Use music or television while you are exercising so that the exercises are a pleasant break in your day. This will make your life better with the exercises acting as a break in routine you can look forward to.  Lying on your back, slowly slide your foot toward your buttocks, raising your knee up off the floor. Then slowly slide your foot back down until your leg is straight again.  Lying on your back spread your legs as far apart as you can without causing discomfort.  Lying on your side, raise your upper leg and foot straight up from the floor as far as is comfortable. Slowly lower the leg and repeat.  Lying on your  back, tighten up the muscle in the front of your thigh (quadriceps muscles). You can do this by keeping your leg straight and trying to raise your heel off the floor. This helps strengthen the largest muscle supporting your knee.  Lying on your back, tighten up the muscles of your buttocks both with the legs straight and with the knee bent at a comfortable angle while keeping your heel on the floor.   IF YOU ARE TRANSFERRED TO A SKILLED REHAB FACILITY If the patient is transferred to a skilled rehab facility following release from the hospital, a list of the current medications will be sent to the facility for the patient to continue.  When discharged from the skilled rehab facility, please have the facility set up the patient's  Home Health Physical Therapy prior to being released. Also, the skilled facility will be responsible for providing the patient with their medications at time of release from the facility to include their pain medication, the muscle relaxants, and their blood thinner medication. If the patient is still at the rehab facility at time of the two week follow up appointment, the skilled rehab facility will also need to assist the patient in arranging follow up appointment in our office and any transportation needs.  MAKE SURE YOU:  Understand these instructions.  Get help right away if you are not doing well or get worse.    Pick up stool softner and laxative for home use following surgery while on pain medications. Do not submerge incision under water. Please use good hand washing techniques while changing dressing each day. May shower starting three days after surgery. Please use a clean towel to pat the incision dry following showers. Continue to use ice for pain and swelling after surgery. Do not use any lotions or creams on the incision until instructed by your surgeon.   Increase activity slowly as tolerated   Complete by:  As directed    TED hose   Complete by:  As  directed    Use stockings (TED hose) for 3 weeks on operative leg(s).  You may remove them at night for sleeping.     Allergies as of 06/26/2017   No Known Allergies     Medication List    STOP taking these medications   b complex vitamins tablet   fish oil-omega-3 fatty acids 1000 MG capsule   naproxen sodium 220 MG tablet Commonly known as:  ALEVE   Vitamin D 2000 units Caps     TAKE these medications   esomeprazole 20 MG capsule Commonly known as:  NEXIUM Take 20 mg by mouth daily at 2 PM.   HYDROcodone-acetaminophen 7.5-325 MG tablet Commonly known as:  NORCO Take 1 tablet by mouth every 4 (four) hours as needed for severe pain (pain score 7-10).   methocarbamol 500 MG tablet Commonly known as:  ROBAXIN Take 1 tablet (500 mg total) by mouth every 6 (six) hours as needed for muscle spasms.   rivaroxaban 10 MG Tabs tablet Commonly known as:  XARELTO Take 1 tablet (10 mg total) by mouth daily with breakfast.   traMADol 50 MG tablet Commonly known as:  ULTRAM Take 1-2 tablets (50-100 mg total) by mouth every 6 (six) hours as needed for moderate pain.            Durable Medical Equipment  (From admission, onward)        Start     Ordered   06/26/17 1031  For home use only DME 3 n 1  Once     06/26/17 1030   06/26/17 1030  For home use only DME Walker rolling  Once    Question:  Patient needs a walker to treat with the following condition  Answer:  S/P hip replacement   06/26/17 1030     Follow-up Information    Gaynelle Arabian, MD. Schedule an appointment as soon as possible for a visit on 07/10/2017.   Specialty:  Orthopedic Surgery Contact information: [address] [place] [address] [phone]        [facility] Follow up.   Specialty:  [place] Why:  physical therapy Contact information: [place] [address] [phone]  [facility] Follow up.     Why:  walker and 3n1 Contact information: [address] [place] [address] [phone]           Signed: Ardeen Jourdain, PA-C Orthopaedic Surgery 06/26/2017, 3:12 PM

## 2017-06-26 NOTE — Progress Notes (Signed)
   Subjective: 1 Day Post-Op Procedure(s) (LRB): LEFT TOTAL HIP ARTHROPLASTY ANTERIOR APPROACH (Left) Patient reports pain as mild.   Patient seen in rounds with Dr. Wynelle Link. Patient is well, and has had no acute complaints or problems. No issues overnight. Voiding well. We will start therapy today.  Plan is to go Home after hospital stay.  Objective: Vital signs in last 24 hours: Temp:  [97.2 F (36.2 C)-98.4 F (36.9 C)] 98 F (36.7 C) (04/25 0627) Pulse Rate:  [49-82] 68 (04/25 0627) Resp:  [11-19] 11 (04/25 0627) BP: (103-154)/(62-96) 135/94 (04/25 0627) SpO2:  [96 %-100 %] 100 % (04/25 0627) Weight:  [81.6 kg (180 lb)] 81.6 kg (180 lb) (04/24 1247)  Intake/Output from previous day:  Intake/Output Summary (Last 24 hours) at 06/26/2017 0813 Last data filed at 06/26/2017 0700 Gross per 24 hour  Intake 4650.33 ml  Output 1970 ml  Net 2680.33 ml     Labs: Recent Labs    06/26/17 0529  HGB 13.3   Recent Labs    06/26/17 0529  WBC 14.7*  RBC 4.37  HCT 39.1  PLT 220   Recent Labs    06/26/17 0529  NA 139  K 4.2  CL 105  CO2 23  BUN 11  CREATININE 1.08  GLUCOSE 126*  CALCIUM 9.0    EXAM General - Patient is Alert and Oriented Extremity - Neurologically intact Intact pulses distally Dorsiflexion/Plantar flexion intact Compartment soft Dressing - dressing C/D/I Motor Function - intact, moving foot and toes well on exam.  Hemovac pulled without difficulty.  Past Medical History:  Diagnosis Date  . Arthritis    OA  . Cough    WHITE SPUTUM OCC, NO FEVER COLD SYMPTOMS MAINLY  . GERD (gastroesophageal reflux disease)     Assessment/Plan: 1 Day Post-Op Procedure(s) (LRB): LEFT TOTAL HIP ARTHROPLASTY ANTERIOR APPROACH (Left) Principal Problem:   OA (osteoarthritis) of hip  Estimated body mass index is 27.78 kg/m as calculated from the following:   Height as of this encounter: 5' 7.5" (1.715 m).   Weight as of this encounter: 81.6 kg (180  lb). Advance diet Up with therapy D/C IV fluids  DVT Prophylaxis - Xarelto Weight Bearing As Tolerated  Hemovac Pulled Begin Therapy  Will start therapy today. If he progresses well after two sessions and pain is under good control, plan for DC home with HHPT. Follow up in 2 weeks.   Ardeen Jourdain, PA-C Orthopaedic Surgery 06/26/2017, 8:13 AM

## 2017-06-26 NOTE — Evaluation (Signed)
Physical Therapy Evaluation Patient Details Name: Juan Snow MRN: [id number] DOB: 1956-02-24 Today's Date: 06/26/2017   History of Present Illness  s/p L THA  Clinical Impression  Pt is s/p THA resulting in the deficits listed below (see PT Problem List). * Pt will benefit from skilled PT to increase their independence and safety with mobility to allow discharge to the venue listed below.      Follow Up Recommendations Home health PT    Equipment Recommendations  Rolling walker with 5" wheels;3in1 (PT)    Recommendations for Other Services       Precautions / Restrictions Precautions Precautions: Fall Restrictions Weight Bearing Restrictions: No      Mobility  Bed Mobility Overal bed mobility: Needs Assistance Bed Mobility: Supine to Sit     Supine to sit: Min assist     General bed mobility comments: with LLE  Transfers Overall transfer level: Needs assistance Equipment used: Rolling walker (2 wheeled) Transfers: Sit to/from Stand Sit to Stand: Min guard         General transfer comment: cues for hand placement  Ambulation/Gait Ambulation/Gait assistance: Min guard Ambulation Distance (Feet): 80 Feet Assistive device: Rolling walker (2 wheeled) Gait Pattern/deviations: Step-to pattern;Step-through pattern     General Gait Details: cues for gait progression and RW safety  Stairs            Wheelchair Mobility    Modified Rankin (Stroke Patients Only)       Balance                                             Pertinent Vitals/Pain Pain Assessment: 0-10 Pain Score: 2  Pain Location: L hip  Pain Descriptors / Indicators: Sore;Discomfort Pain Intervention(s): Limited activity within patient's tolerance;Monitored during session;Premedicated before session    Home Living Family/patient expects to be discharged to:: Private residence Living Arrangements: Spouse/significant other   Type of Home: House Home Access:  Stairs to enter   Technical [place] of Steps: 3 Home Layout: Two level;Bed/bath upstairs Home Equipment: Crutches      Prior Function Level of Independence: Independent with assistive device(s)         Comments: amb with crutches prior to admission     Hand Dominance        Extremity/Trunk Assessment   Upper Extremity Assessment Upper Extremity Assessment: Overall WFL for tasks assessed    Lower Extremity Assessment Lower Extremity Assessment: LLE deficits/detail LLE Deficits / Details: AAROM WFL; knee hip grossly 3/5       Communication   Communication: No difficulties  Cognition Arousal/Alertness: Awake/alert Behavior During Therapy: WFL for tasks assessed/performed Overall Cognitive Status: Within Functional Limits for tasks assessed                                        General Comments      Exercises Total Joint Exercises Ankle Circles/Pumps: AROM;Both;10 reps Quad Sets: 10 reps;AROM;Both Heel Slides: Left;AAROM;10 reps   Assessment/Plan    PT Assessment Patient needs continued PT services  PT Problem List Decreased strength;Decreased range of motion;Decreased activity tolerance;Decreased mobility;Pain;Decreased knowledge of use of DME       PT Treatment Interventions Stair training;Gait training;DME instruction;Therapeutic exercise;Functional mobility training    PT Goals (Current goals can be  found in the Care Plan section)  Acute Rehab PT Goals Patient Stated Goal: back to playing golf PT Goal Formulation: With patient Time For Goal Achievement: 07/03/17 Potential to Achieve Goals: Good    Frequency 7X/week   Barriers to discharge        Co-evaluation               AM-PAC PT "6 Clicks" Daily Activity  Outcome Measure Difficulty turning over in bed (including adjusting bedclothes, sheets and blankets)?: Unable Difficulty moving from lying on back to sitting on the side of the bed? : Unable Difficulty  sitting down on and standing up from a chair with arms (e.g., wheelchair, bedside commode, etc,.)?: Unable Help needed moving to and from a bed to chair (including a wheelchair)?: A Little Help needed walking in hospital room?: A Little Help needed climbing 3-5 steps with a railing? : A Lot 6 Click Score: 11    End of Session Equipment Utilized During Treatment: Gait belt Activity Tolerance: Patient tolerated treatment well Patient left: with call bell/phone within reach;in chair;with family/visitor present;with chair alarm set   PT Visit Diagnosis: Difficulty in walking, not elsewhere classified (R26.2)    Time: 4403-4742 PT Time Calculation (min) (ACUTE ONLY): 30 min   Charges:   PT Evaluation $PT Eval Low Complexity: 1 Low PT Treatments $Gait Training: 8-22 mins   PT G Codes:          Bartley Vuolo 2017-07-25, 3:18 PM

## 2017-06-27 ENCOUNTER — Other Ambulatory Visit: Payer: Self-pay | Admitting: *Deleted

## 2017-06-27 DIAGNOSIS — K219 Gastro-esophageal reflux disease without esophagitis: Secondary | ICD-10-CM | POA: Diagnosis not present

## 2017-06-27 DIAGNOSIS — Z7901 Long term (current) use of anticoagulants: Secondary | ICD-10-CM | POA: Diagnosis not present

## 2017-06-27 DIAGNOSIS — Z471 Aftercare following joint replacement surgery: Secondary | ICD-10-CM | POA: Diagnosis not present

## 2017-06-27 DIAGNOSIS — Z96642 Presence of left artificial hip joint: Secondary | ICD-10-CM | POA: Diagnosis not present

## 2017-06-27 NOTE — Patient Outreach (Addendum)
+  [facility]) Care Management  06/27/2017  Juan Snow 1955/09/18 [id number]   Subjective:  Telephone call to patient's home number, spoke with patient, and HIPAA verified.  Discussed [facility] Care Management UMR Transition of care follow up, patient voiced understanding, and is in agreement to follow up.   Patient gave verbal consent to speak with wife Natividad Brood, as needed regarding his healthcare needs.  Patient states he is doing well, [facility] did an excellent job with his care, pain being managed with pain medications, home health services started today, and session went well.   States he has a follow up appointment with surgeon on 07/10/17.  Patient states he is able to manage self care and has assistance as needed with activities of daily living / home management.  Patient voices understanding of medical diagnosis, surgery, and treatment plan.  States he is accessing the following [facility] benefits: outpatient pharmacy, hospital indemnity (not sure, will ask wife to verify benefit, file claim if appropriate), short term disability, long term disability, both he and wife (who is also a Furniture conservator/restorer)  have family medical leave act (FMLA) in place.  Patient states he does not have any education material, transition of care, care coordination, disease management, disease monitoring, transportation, community resource, or pharmacy needs at this time. States he is very appreciative of the follow up and is in agreement to receive [place] Management information.     Objective: Per KPN (Knowledge Performance Now, point of care tool) and chart review, patient hospitalized 06/25/17- 06/26/17 for Primary osteoarthritis left hip.   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     Assessment: Received UMR  Preoperative / Transition of care referral on 06/06/17.    Transition of care follow up completed, no care management needs, and will proceed with case  closure.       Plan:  RNCM will send patient successful outreach letter, [facility] pamphlet, and magnet. RNCM will complete case closure due to follow up completed / no care management needs.       Fabrice Dyal H. Annia Friendly, BSN, [place] Management [facility] Telephonic CM Phone: [phone] Fax: [phone]

## 2017-06-29 DIAGNOSIS — M169 Osteoarthritis of hip, unspecified: Secondary | ICD-10-CM | POA: Diagnosis not present

## 2017-06-30 DIAGNOSIS — Z7901 Long term (current) use of anticoagulants: Secondary | ICD-10-CM | POA: Diagnosis not present

## 2017-06-30 DIAGNOSIS — Z96642 Presence of left artificial hip joint: Secondary | ICD-10-CM | POA: Diagnosis not present

## 2017-06-30 DIAGNOSIS — K219 Gastro-esophageal reflux disease without esophagitis: Secondary | ICD-10-CM | POA: Diagnosis not present

## 2017-06-30 DIAGNOSIS — Z471 Aftercare following joint replacement surgery: Secondary | ICD-10-CM | POA: Diagnosis not present

## 2017-07-02 DIAGNOSIS — Z7901 Long term (current) use of anticoagulants: Secondary | ICD-10-CM | POA: Diagnosis not present

## 2017-07-02 DIAGNOSIS — K219 Gastro-esophageal reflux disease without esophagitis: Secondary | ICD-10-CM | POA: Diagnosis not present

## 2017-07-02 DIAGNOSIS — Z96642 Presence of left artificial hip joint: Secondary | ICD-10-CM | POA: Diagnosis not present

## 2017-07-02 DIAGNOSIS — Z471 Aftercare following joint replacement surgery: Secondary | ICD-10-CM | POA: Diagnosis not present

## 2017-07-04 DIAGNOSIS — K219 Gastro-esophageal reflux disease without esophagitis: Secondary | ICD-10-CM | POA: Diagnosis not present

## 2017-07-04 DIAGNOSIS — Z471 Aftercare following joint replacement surgery: Secondary | ICD-10-CM | POA: Diagnosis not present

## 2017-07-04 DIAGNOSIS — Z7901 Long term (current) use of anticoagulants: Secondary | ICD-10-CM | POA: Diagnosis not present

## 2017-07-04 DIAGNOSIS — Z96642 Presence of left artificial hip joint: Secondary | ICD-10-CM | POA: Diagnosis not present

## 2017-07-07 DIAGNOSIS — Z7901 Long term (current) use of anticoagulants: Secondary | ICD-10-CM | POA: Diagnosis not present

## 2017-07-07 DIAGNOSIS — Z96642 Presence of left artificial hip joint: Secondary | ICD-10-CM | POA: Diagnosis not present

## 2017-07-07 DIAGNOSIS — Z471 Aftercare following joint replacement surgery: Secondary | ICD-10-CM | POA: Diagnosis not present

## 2017-07-07 DIAGNOSIS — K219 Gastro-esophageal reflux disease without esophagitis: Secondary | ICD-10-CM | POA: Diagnosis not present

## 2017-07-08 MED FILL — traMADol HCL 50 MG TABS: 50 | 8 days supply | Qty: 30 | Fill #0

## 2017-07-09 DIAGNOSIS — Z471 Aftercare following joint replacement surgery: Secondary | ICD-10-CM | POA: Diagnosis not present

## 2017-07-09 DIAGNOSIS — K219 Gastro-esophageal reflux disease without esophagitis: Secondary | ICD-10-CM | POA: Diagnosis not present

## 2017-07-09 DIAGNOSIS — Z7901 Long term (current) use of anticoagulants: Secondary | ICD-10-CM | POA: Diagnosis not present

## 2017-07-09 DIAGNOSIS — Z96642 Presence of left artificial hip joint: Secondary | ICD-10-CM | POA: Diagnosis not present

## 2017-07-29 DIAGNOSIS — Z471 Aftercare following joint replacement surgery: Secondary | ICD-10-CM | POA: Diagnosis not present

## 2017-07-29 DIAGNOSIS — Z96642 Presence of left artificial hip joint: Secondary | ICD-10-CM | POA: Diagnosis not present

## 2018-01-13 DIAGNOSIS — K219 Gastro-esophageal reflux disease without esophagitis: Secondary | ICD-10-CM | POA: Diagnosis not present

## 2018-01-13 DIAGNOSIS — Z86010 Personal history of colonic polyps: Secondary | ICD-10-CM | POA: Diagnosis not present

## 2018-01-13 DIAGNOSIS — K5730 Diverticulosis of large intestine without perforation or abscess without bleeding: Secondary | ICD-10-CM | POA: Diagnosis not present

## 2018-01-13 DIAGNOSIS — Z1211 Encounter for screening for malignant neoplasm of colon: Secondary | ICD-10-CM | POA: Diagnosis not present

## 2018-03-26 MED FILL — GAVILYTE-G SOLUTION: 236 | 1 days supply | Qty: 4000 | Fill #0

## 2018-03-30 DIAGNOSIS — K5730 Diverticulosis of large intestine without perforation or abscess without bleeding: Secondary | ICD-10-CM | POA: Diagnosis not present

## 2018-03-30 DIAGNOSIS — D128 Benign neoplasm of rectum: Secondary | ICD-10-CM | POA: Diagnosis not present

## 2018-03-30 DIAGNOSIS — K635 Polyp of colon: Secondary | ICD-10-CM | POA: Diagnosis not present

## 2018-03-30 DIAGNOSIS — Z1211 Encounter for screening for malignant neoplasm of colon: Secondary | ICD-10-CM | POA: Diagnosis not present

## 2018-03-30 DIAGNOSIS — D123 Benign neoplasm of transverse colon: Secondary | ICD-10-CM | POA: Diagnosis not present

## 2018-03-30 DIAGNOSIS — K621 Rectal polyp: Secondary | ICD-10-CM | POA: Diagnosis not present

## 2018-10-15 DIAGNOSIS — E7800 Pure hypercholesterolemia, unspecified: Secondary | ICD-10-CM | POA: Diagnosis not present

## 2018-10-15 DIAGNOSIS — D582 Other hemoglobinopathies: Secondary | ICD-10-CM | POA: Diagnosis not present

## 2018-10-15 DIAGNOSIS — R748 Abnormal levels of other serum enzymes: Secondary | ICD-10-CM | POA: Diagnosis not present

## 2018-10-15 DIAGNOSIS — E559 Vitamin D deficiency, unspecified: Secondary | ICD-10-CM | POA: Diagnosis not present

## 2018-10-15 DIAGNOSIS — R7989 Other specified abnormal findings of blood chemistry: Secondary | ICD-10-CM | POA: Diagnosis not present

## 2018-10-15 DIAGNOSIS — R030 Elevated blood-pressure reading, without diagnosis of hypertension: Secondary | ICD-10-CM | POA: Diagnosis not present

## 2018-10-15 DIAGNOSIS — Z125 Encounter for screening for malignant neoplasm of prostate: Secondary | ICD-10-CM | POA: Diagnosis not present

## 2018-10-15 DIAGNOSIS — K219 Gastro-esophageal reflux disease without esophagitis: Secondary | ICD-10-CM | POA: Diagnosis not present

## 2018-10-19 MED FILL — ATORVASTATIN 10 MG TABLET: 10 | 30 days supply | Qty: 30 | Fill #0

## 2018-11-19 MED FILL — ATORVASTATIN 10 MG TABLET: 10 | 30 days supply | Qty: 30 | Fill #1

## 2018-12-21 MED FILL — ATORVASTATIN 10 MG TABLET: 10 | 90 days supply | Qty: 90 | Fill #2

## 2019-01-29 DIAGNOSIS — H524 Presbyopia: Secondary | ICD-10-CM | POA: Diagnosis not present

## 2019-07-28 ENCOUNTER — Ambulatory Visit: Payer: 59 | Attending: Internal Medicine

## 2019-12-24 DIAGNOSIS — D582 Other hemoglobinopathies: Secondary | ICD-10-CM | POA: Diagnosis not present

## 2019-12-24 DIAGNOSIS — E7800 Pure hypercholesterolemia, unspecified: Secondary | ICD-10-CM | POA: Diagnosis not present

## 2019-12-24 DIAGNOSIS — Z0000 Encounter for general adult medical examination without abnormal findings: Secondary | ICD-10-CM | POA: Diagnosis not present

## 2019-12-24 DIAGNOSIS — E559 Vitamin D deficiency, unspecified: Secondary | ICD-10-CM | POA: Diagnosis not present

## 2019-12-24 DIAGNOSIS — R7989 Other specified abnormal findings of blood chemistry: Secondary | ICD-10-CM | POA: Diagnosis not present

## 2019-12-24 DIAGNOSIS — K219 Gastro-esophageal reflux disease without esophagitis: Secondary | ICD-10-CM | POA: Diagnosis not present

## 2019-12-24 DIAGNOSIS — Z125 Encounter for screening for malignant neoplasm of prostate: Secondary | ICD-10-CM | POA: Diagnosis not present

## 2019-12-29 ENCOUNTER — Other Ambulatory Visit (HOSPITAL_COMMUNITY): Payer: Self-pay | Admitting: Family Medicine

## 2019-12-30 MED FILL — ATORVASTATIN CALCIUM 10 MG: 10 | 90 days supply | Qty: 90 | Fill #0

## 2020-02-03 ENCOUNTER — Other Ambulatory Visit (HOSPITAL_COMMUNITY): Payer: Self-pay | Admitting: Periodontics

## 2020-02-04 MED FILL — AMOXICILLIN 500 MG CAPSULE: 500 | 10 days supply | Qty: 30 | Fill #0

## 2020-04-05 MED FILL — ATORVASTATIN CALCIUM 10 MG: 10 | 90 days supply | Qty: 90 | Fill #1

## 2020-04-24 DIAGNOSIS — E7800 Pure hypercholesterolemia, unspecified: Secondary | ICD-10-CM | POA: Diagnosis not present

## 2020-04-24 DIAGNOSIS — E559 Vitamin D deficiency, unspecified: Secondary | ICD-10-CM | POA: Diagnosis not present

## 2020-04-24 DIAGNOSIS — D582 Other hemoglobinopathies: Secondary | ICD-10-CM | POA: Diagnosis not present

## 2020-07-06 ENCOUNTER — Other Ambulatory Visit (HOSPITAL_COMMUNITY): Payer: Self-pay

## 2020-07-06 MED ORDER — ATORVASTATIN CALCIUM 10 MG PO TABS
10.0000 mg | ORAL_TABLET | Freq: Every day | ORAL | 0 refills | Status: DC
  Filled 2020-07-06: qty 90, 90d supply, fill #0

## 2020-07-07 ENCOUNTER — Other Ambulatory Visit (HOSPITAL_COMMUNITY): Payer: Self-pay

## 2020-08-28 DIAGNOSIS — E7800 Pure hypercholesterolemia, unspecified: Secondary | ICD-10-CM | POA: Diagnosis not present

## 2020-08-28 DIAGNOSIS — D582 Other hemoglobinopathies: Secondary | ICD-10-CM | POA: Diagnosis not present

## 2020-08-28 DIAGNOSIS — K219 Gastro-esophageal reflux disease without esophagitis: Secondary | ICD-10-CM | POA: Diagnosis not present

## 2020-08-28 DIAGNOSIS — E559 Vitamin D deficiency, unspecified: Secondary | ICD-10-CM | POA: Diagnosis not present

## 2020-08-28 DIAGNOSIS — R739 Hyperglycemia, unspecified: Secondary | ICD-10-CM | POA: Diagnosis not present

## 2020-08-28 DIAGNOSIS — R748 Abnormal levels of other serum enzymes: Secondary | ICD-10-CM | POA: Diagnosis not present

## 2020-10-10 ENCOUNTER — Other Ambulatory Visit (HOSPITAL_COMMUNITY): Payer: Self-pay

## 2020-10-10 MED ORDER — ATORVASTATIN CALCIUM 10 MG PO TABS
10.0000 mg | ORAL_TABLET | Freq: Every day | ORAL | 0 refills | Status: DC
  Filled 2020-10-10: qty 90, 90d supply, fill #0

## 2020-12-29 DIAGNOSIS — Z125 Encounter for screening for malignant neoplasm of prostate: Secondary | ICD-10-CM | POA: Diagnosis not present

## 2020-12-29 DIAGNOSIS — R748 Abnormal levels of other serum enzymes: Secondary | ICD-10-CM | POA: Diagnosis not present

## 2020-12-29 DIAGNOSIS — E559 Vitamin D deficiency, unspecified: Secondary | ICD-10-CM | POA: Diagnosis not present

## 2020-12-29 DIAGNOSIS — E7800 Pure hypercholesterolemia, unspecified: Secondary | ICD-10-CM | POA: Diagnosis not present

## 2020-12-29 DIAGNOSIS — R739 Hyperglycemia, unspecified: Secondary | ICD-10-CM | POA: Diagnosis not present

## 2020-12-29 DIAGNOSIS — K219 Gastro-esophageal reflux disease without esophagitis: Secondary | ICD-10-CM | POA: Diagnosis not present

## 2020-12-29 DIAGNOSIS — R7989 Other specified abnormal findings of blood chemistry: Secondary | ICD-10-CM | POA: Diagnosis not present

## 2020-12-29 DIAGNOSIS — Z0001 Encounter for general adult medical examination with abnormal findings: Secondary | ICD-10-CM | POA: Diagnosis not present

## 2020-12-29 DIAGNOSIS — D582 Other hemoglobinopathies: Secondary | ICD-10-CM | POA: Diagnosis not present

## 2021-01-11 ENCOUNTER — Other Ambulatory Visit (HOSPITAL_COMMUNITY): Payer: Self-pay

## 2021-01-11 MED ORDER — ATORVASTATIN CALCIUM 10 MG PO TABS
10.0000 mg | ORAL_TABLET | Freq: Every day | ORAL | 3 refills | Status: DC
  Filled 2021-01-11: qty 90, 90d supply, fill #0
  Filled 2021-03-02 – 2021-03-31 (×2): qty 90, 90d supply, fill #1

## 2021-03-02 ENCOUNTER — Other Ambulatory Visit (HOSPITAL_COMMUNITY): Payer: Self-pay

## 2021-03-27 ENCOUNTER — Emergency Department (HOSPITAL_COMMUNITY): Payer: 59

## 2021-03-27 ENCOUNTER — Inpatient Hospital Stay (HOSPITAL_COMMUNITY)
Admission: EM | Admit: 2021-03-27 | Discharge: 2021-03-31 | DRG: 854 | Disposition: A | Discharge status: Home / Self Care | Payer: 59 | Attending: Internal Medicine | Admitting: Internal Medicine

## 2021-03-27 ENCOUNTER — Encounter (HOSPITAL_COMMUNITY): Payer: Self-pay | Admitting: Emergency Medicine

## 2021-03-27 DIAGNOSIS — E876 Hypokalemia: Secondary | ICD-10-CM | POA: Diagnosis present

## 2021-03-27 DIAGNOSIS — R9431 Abnormal electrocardiogram [ECG] [EKG]: Secondary | ICD-10-CM | POA: Diagnosis present

## 2021-03-27 DIAGNOSIS — R000 Tachycardia, unspecified: Secondary | ICD-10-CM | POA: Diagnosis not present

## 2021-03-27 DIAGNOSIS — J690 Pneumonitis due to inhalation of food and vomit: Secondary | ICD-10-CM | POA: Diagnosis not present

## 2021-03-27 DIAGNOSIS — Z8249 Family history of ischemic heart disease and other diseases of the circulatory system: Secondary | ICD-10-CM

## 2021-03-27 DIAGNOSIS — R1013 Epigastric pain: Secondary | ICD-10-CM

## 2021-03-27 DIAGNOSIS — K5790 Diverticulosis of intestine, part unspecified, without perforation or abscess without bleeding: Secondary | ICD-10-CM | POA: Diagnosis present

## 2021-03-27 DIAGNOSIS — Z20822 Contact with and (suspected) exposure to covid-19: Secondary | ICD-10-CM | POA: Diagnosis present

## 2021-03-27 DIAGNOSIS — R7989 Other specified abnormal findings of blood chemistry: Secondary | ICD-10-CM | POA: Diagnosis present

## 2021-03-27 DIAGNOSIS — Z6828 Body mass index (BMI) 28.0-28.9, adult: Secondary | ICD-10-CM | POA: Diagnosis not present

## 2021-03-27 DIAGNOSIS — E7800 Pure hypercholesterolemia, unspecified: Secondary | ICD-10-CM | POA: Diagnosis present

## 2021-03-27 DIAGNOSIS — K5730 Diverticulosis of large intestine without perforation or abscess without bleeding: Secondary | ICD-10-CM | POA: Diagnosis not present

## 2021-03-27 DIAGNOSIS — R630 Anorexia: Secondary | ICD-10-CM | POA: Diagnosis not present

## 2021-03-27 DIAGNOSIS — N179 Acute kidney failure, unspecified: Secondary | ICD-10-CM | POA: Diagnosis not present

## 2021-03-27 DIAGNOSIS — Z96642 Presence of left artificial hip joint: Secondary | ICD-10-CM | POA: Diagnosis present

## 2021-03-27 DIAGNOSIS — R109 Unspecified abdominal pain: Secondary | ICD-10-CM

## 2021-03-27 DIAGNOSIS — R6520 Severe sepsis without septic shock: Secondary | ICD-10-CM | POA: Diagnosis not present

## 2021-03-27 DIAGNOSIS — D696 Thrombocytopenia, unspecified: Secondary | ICD-10-CM | POA: Diagnosis not present

## 2021-03-27 DIAGNOSIS — K8050 Calculus of bile duct without cholangitis or cholecystitis without obstruction: Secondary | ICD-10-CM | POA: Diagnosis not present

## 2021-03-27 DIAGNOSIS — R749 Abnormal serum enzyme level, unspecified: Secondary | ICD-10-CM | POA: Diagnosis not present

## 2021-03-27 DIAGNOSIS — K219 Gastro-esophageal reflux disease without esophagitis: Secondary | ICD-10-CM | POA: Diagnosis present

## 2021-03-27 DIAGNOSIS — Z7901 Long term (current) use of anticoagulants: Secondary | ICD-10-CM

## 2021-03-27 DIAGNOSIS — K30 Functional dyspepsia: Secondary | ICD-10-CM | POA: Diagnosis present

## 2021-03-27 DIAGNOSIS — A4151 Sepsis due to Escherichia coli [E. coli]: Principal | ICD-10-CM | POA: Diagnosis present

## 2021-03-27 DIAGNOSIS — E860 Dehydration: Secondary | ICD-10-CM | POA: Diagnosis present

## 2021-03-27 DIAGNOSIS — A419 Sepsis, unspecified organism: Secondary | ICD-10-CM | POA: Diagnosis not present

## 2021-03-27 DIAGNOSIS — K759 Inflammatory liver disease, unspecified: Secondary | ICD-10-CM | POA: Diagnosis present

## 2021-03-27 DIAGNOSIS — Z419 Encounter for procedure for purposes other than remedying health state, unspecified: Secondary | ICD-10-CM

## 2021-03-27 DIAGNOSIS — R932 Abnormal findings on diagnostic imaging of liver and biliary tract: Secondary | ICD-10-CM | POA: Diagnosis not present

## 2021-03-27 DIAGNOSIS — R1110 Vomiting, unspecified: Secondary | ICD-10-CM | POA: Diagnosis not present

## 2021-03-27 DIAGNOSIS — K8070 Calculus of gallbladder and bile duct without cholecystitis without obstruction: Secondary | ICD-10-CM | POA: Diagnosis not present

## 2021-03-27 DIAGNOSIS — R079 Chest pain, unspecified: Secondary | ICD-10-CM

## 2021-03-27 DIAGNOSIS — R1011 Right upper quadrant pain: Secondary | ICD-10-CM

## 2021-03-27 DIAGNOSIS — Z79899 Other long term (current) drug therapy: Secondary | ICD-10-CM

## 2021-03-27 DIAGNOSIS — K8020 Calculus of gallbladder without cholecystitis without obstruction: Secondary | ICD-10-CM | POA: Diagnosis present

## 2021-03-27 DIAGNOSIS — K8010 Calculus of gallbladder with chronic cholecystitis without obstruction: Secondary | ICD-10-CM | POA: Diagnosis not present

## 2021-03-27 DIAGNOSIS — R7881 Bacteremia: Secondary | ICD-10-CM | POA: Diagnosis not present

## 2021-03-27 DIAGNOSIS — Z48815 Encounter for surgical aftercare following surgery on the digestive system: Secondary | ICD-10-CM | POA: Diagnosis not present

## 2021-03-27 LAB — URINALYSIS, ROUTINE W REFLEX MICROSCOPIC
Bacteria, UA: NONE SEEN
Glucose, UA: NEGATIVE mg/dL
Hgb urine dipstick: NEGATIVE
Ketones, ur: 20 mg/dL — AB
Leukocytes,Ua: NEGATIVE
Nitrite: NEGATIVE
Protein, ur: 100 mg/dL — AB
Specific Gravity, Urine: 1.029 (ref 1.005–1.030)
pH: 5 (ref 5.0–8.0)

## 2021-03-27 LAB — COMPREHENSIVE METABOLIC PANEL
ALT: 77 U/L — ABNORMAL HIGH (ref 0–44)
AST: 39 U/L (ref 15–41)
Albumin: 4 g/dL (ref 3.5–5.0)
Alkaline Phosphatase: 103 U/L (ref 38–126)
Anion gap: 12 (ref 5–15)
BUN: 20 mg/dL (ref 8–23)
CO2: 20 mmol/L — ABNORMAL LOW (ref 22–32)
Calcium: 9.2 mg/dL (ref 8.9–10.3)
Chloride: 101 mmol/L (ref 98–111)
Creatinine, Ser: 1.4 mg/dL — ABNORMAL HIGH (ref 0.61–1.24)
GFR, Estimated: 56 mL/min — ABNORMAL LOW (ref >60)
Glucose, Bld: 155 mg/dL — ABNORMAL HIGH (ref 70–99)
Potassium: 3.9 mmol/L (ref 3.5–5.1)
Sodium: 133 mmol/L — ABNORMAL LOW (ref 135–145)
Total Bilirubin: 4.6 mg/dL — ABNORMAL HIGH (ref 0.3–1.2)
Total Protein: 7.1 g/dL (ref 6.5–8.1)

## 2021-03-27 LAB — CBC WITH DIFFERENTIAL/PLATELET
Abs Immature Granulocytes: 0.06 10*3/uL (ref 0.00–0.07)
Basophils Absolute: 0 10*3/uL (ref 0.0–0.1)
Basophils Relative: 0 %
Eosinophils Absolute: 0 10*3/uL (ref 0.0–0.5)
Eosinophils Relative: 0 %
HCT: 49.8 % (ref 39.0–52.0)
Hemoglobin: 17.2 g/dL — ABNORMAL HIGH (ref 13.0–17.0)
Immature Granulocytes: 0 %
Lymphocytes Relative: 1 %
Lymphs Abs: 0.1 10*3/uL — ABNORMAL LOW (ref 0.7–4.0)
MCH: 31.1 pg (ref 26.0–34.0)
MCHC: 34.5 g/dL (ref 30.0–36.0)
MCV: 90.1 fL (ref 80.0–100.0)
Monocytes Absolute: 0.3 10*3/uL (ref 0.1–1.0)
Monocytes Relative: 2 %
Neutro Abs: 13.4 10*3/uL — ABNORMAL HIGH (ref 1.7–7.7)
Neutrophils Relative %: 97 %
Platelets: 117 10*3/uL — ABNORMAL LOW (ref 150–400)
RBC: 5.53 MIL/uL (ref 4.22–5.81)
RDW: 13.1 % (ref 11.5–15.5)
WBC: 13.9 10*3/uL — ABNORMAL HIGH (ref 4.0–10.5)
nRBC: 0 % (ref 0.0–0.2)

## 2021-03-27 LAB — OSMOLALITY, URINE: Osmolality, Ur: 801 mOsm/kg (ref 300–900)

## 2021-03-27 LAB — CREATININE, URINE, RANDOM: Creatinine, Urine: 424.62 mg/dL

## 2021-03-27 LAB — BILIRUBIN, FRACTIONATED(TOT/DIR/INDIR)
Bilirubin, Direct: 3.1 mg/dL — ABNORMAL HIGH (ref 0.0–0.2)
Indirect Bilirubin: 2.3 mg/dL — ABNORMAL HIGH (ref 0.3–0.9)
Total Bilirubin: 5.4 mg/dL — ABNORMAL HIGH (ref 0.3–1.2)

## 2021-03-27 LAB — CK: Total CK: 93 U/L (ref 49–397)

## 2021-03-27 LAB — BLOOD GAS, VENOUS
Acid-Base Excess: 1.3 mmol/L (ref 0.0–2.0)
Bicarbonate: 25.1 mmol/L (ref 20.0–28.0)
O2 Saturation: 92.7 %
Patient temperature: 98.6
pCO2, Ven: 38.6 mmHg — ABNORMAL LOW (ref 44.0–60.0)
pH, Ven: 7.429 (ref 7.250–7.430)
pO2, Ven: 69.1 mmHg — ABNORMAL HIGH (ref 32.0–45.0)

## 2021-03-27 LAB — TROPONIN I (HIGH SENSITIVITY): Troponin I (High Sensitivity): 16 ng/L (ref <18)

## 2021-03-27 LAB — LACTIC ACID, PLASMA
Lactic Acid, Venous: 2.2 mmol/L (ref 0.5–1.9)
Lactic Acid, Venous: 1.6 mmol/L (ref 0.5–1.9)
Lactic Acid, Venous: 1.6 mmol/L (ref 0.5–1.9)

## 2021-03-27 LAB — RESP PANEL BY RT-PCR (FLU A&B, COVID) ARPGX2
Influenza A by PCR: NEGATIVE
Influenza B by PCR: NEGATIVE
SARS Coronavirus 2 by RT PCR: NEGATIVE

## 2021-03-27 LAB — MAGNESIUM: Magnesium: 1.7 mg/dL (ref 1.7–2.4)

## 2021-03-27 LAB — ACETAMINOPHEN LEVEL: Acetaminophen (Tylenol), Serum: <10 ug/mL — ABNORMAL LOW (ref 10–30)

## 2021-03-27 LAB — SODIUM, URINE, RANDOM: Sodium, Ur: 35 mmol/L

## 2021-03-27 LAB — SALICYLATE LEVEL: Salicylate Lvl: <7 mg/dL — ABNORMAL LOW (ref 7.0–30.0)

## 2021-03-27 LAB — D-DIMER, QUANTITATIVE: D-Dimer, Quant: 1.05 ug/mL-FEU — ABNORMAL HIGH (ref 0.00–0.50)

## 2021-03-27 LAB — PHOSPHORUS: Phosphorus: 4.2 mg/dL (ref 2.5–4.6)

## 2021-03-27 LAB — TYPE AND SCREEN
ABO/RH(D): B POS
Antibody Screen: NEGATIVE

## 2021-03-27 LAB — LIPASE, BLOOD: Lipase: 24 U/L (ref 11–51)

## 2021-03-27 IMAGING — US US ABDOMEN LIMITED
1 series · 15 of 25 positions shown · non-contrast
Comparison: None.

CLINICAL DATA: Abdominal pain

EXAM:
ULTRASOUND ABDOMEN LIMITED RIGHT UPPER QUADRANT

[Series 1: us abdomen limited ruq mc & wl · 15 of 70 slices shown]
[im 1/70]
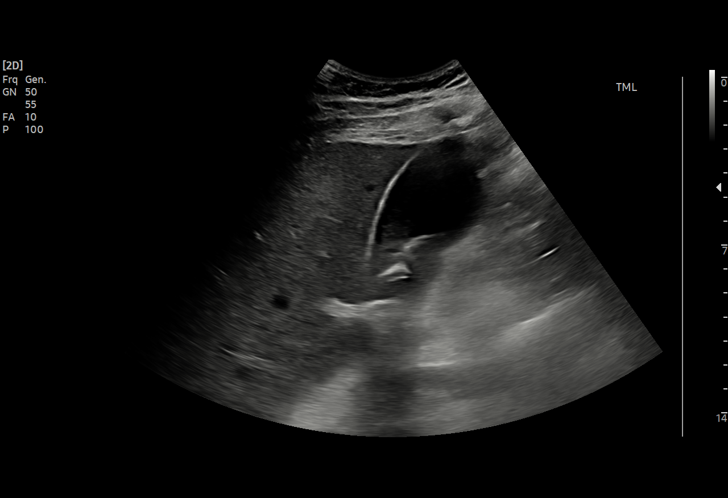
[im 6/70]
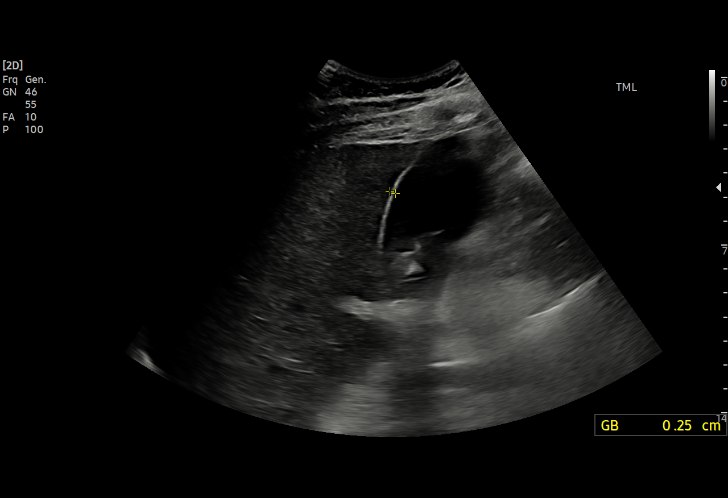
[im 12/70]
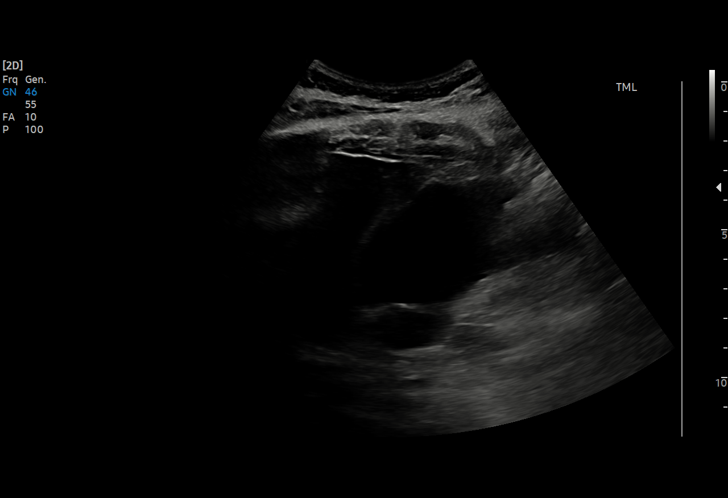
[im 15/70]
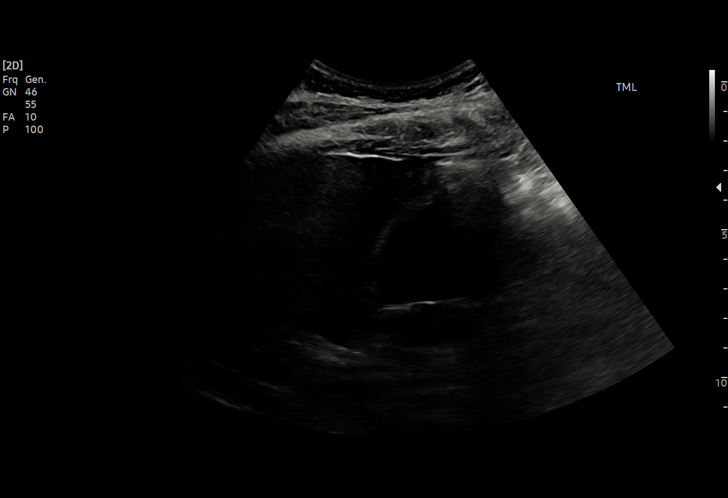
[im 21/70]
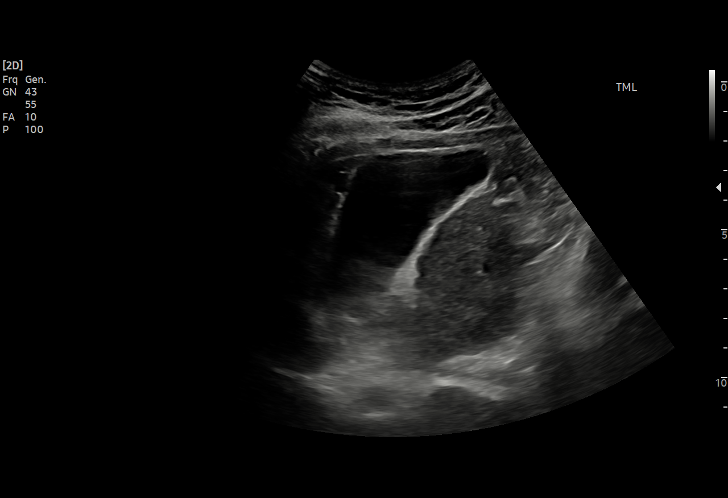
[im 26/70]
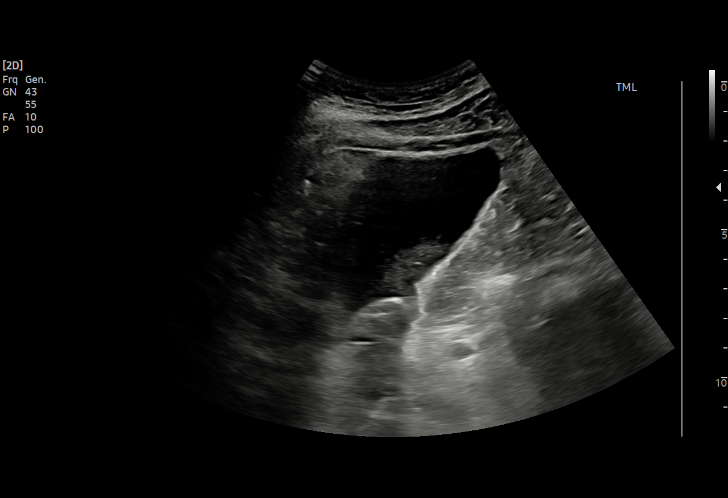
[im 29/70]
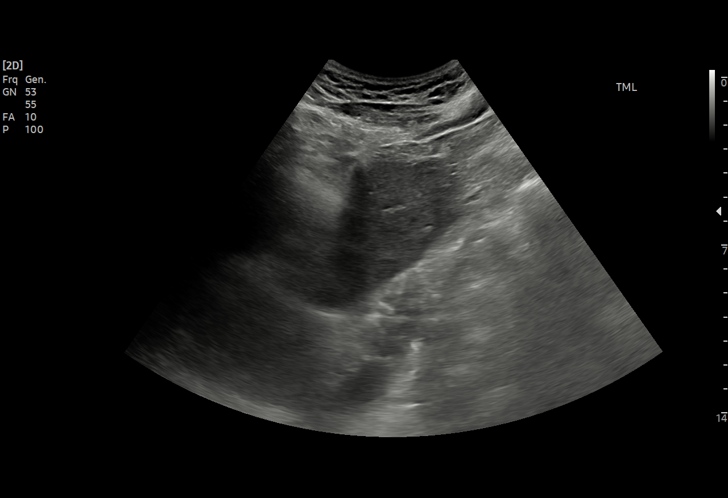
[im 35/70]
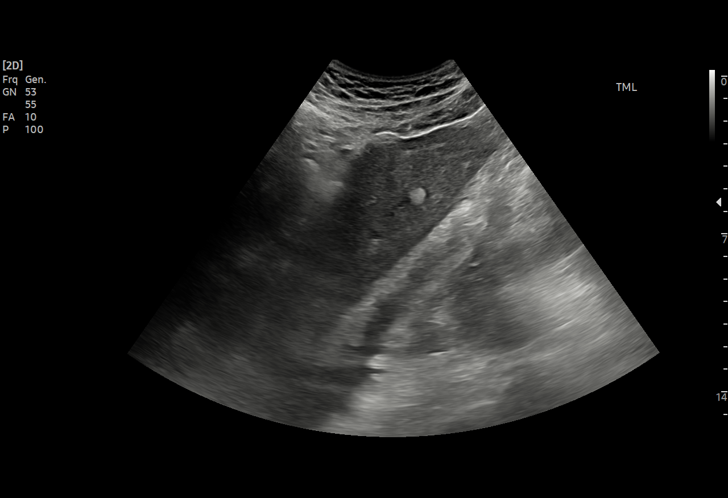
[im 41/70]
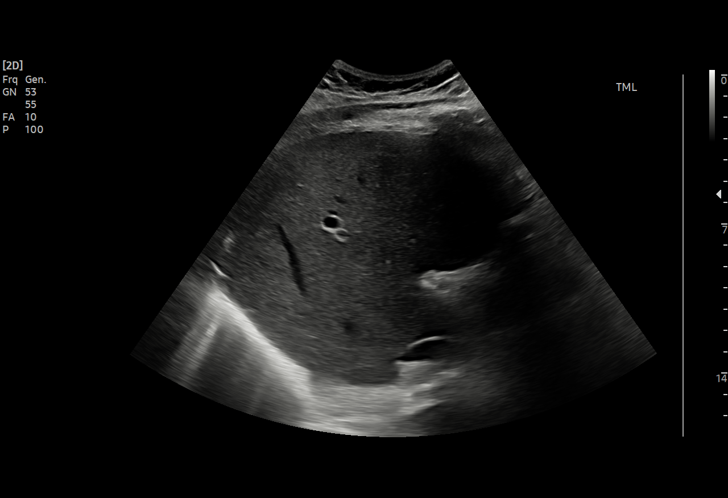
[im 44/70]
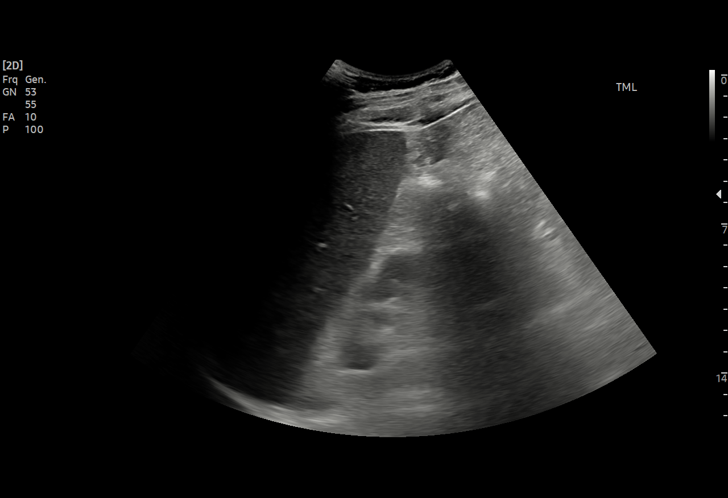
[im 49/70]
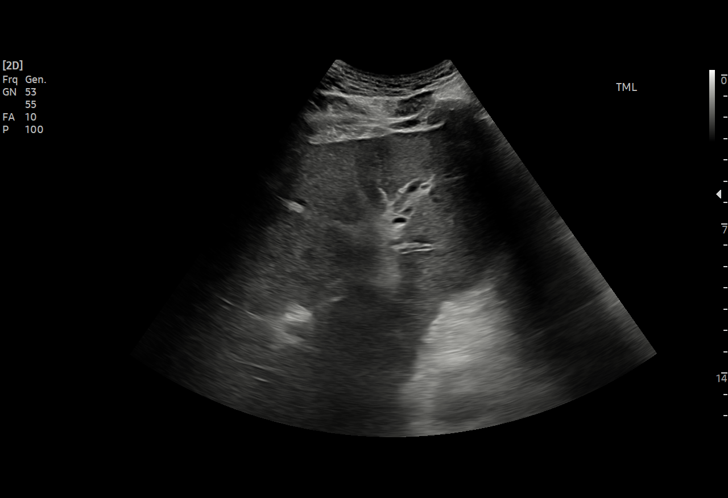
[im 55/70]
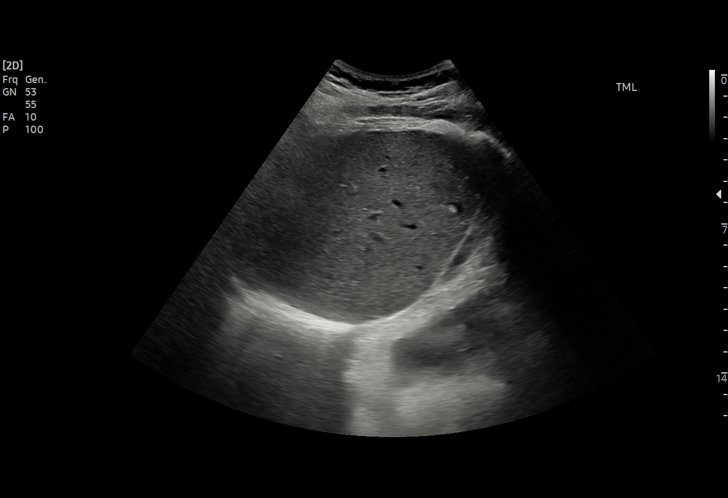
[im 58/70]
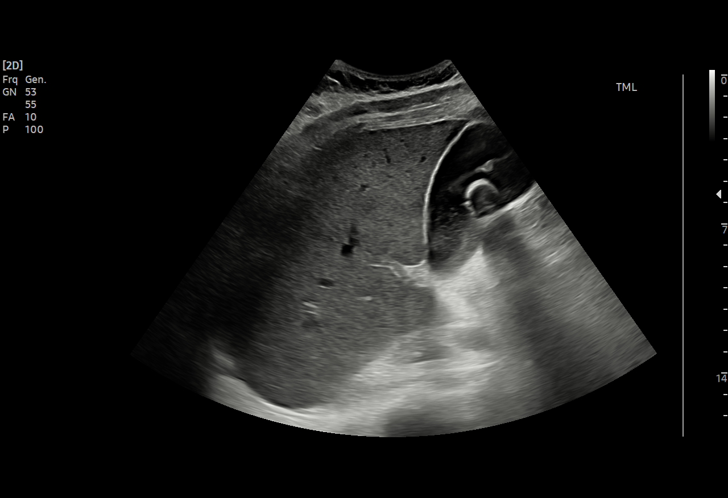
[im 64/70]
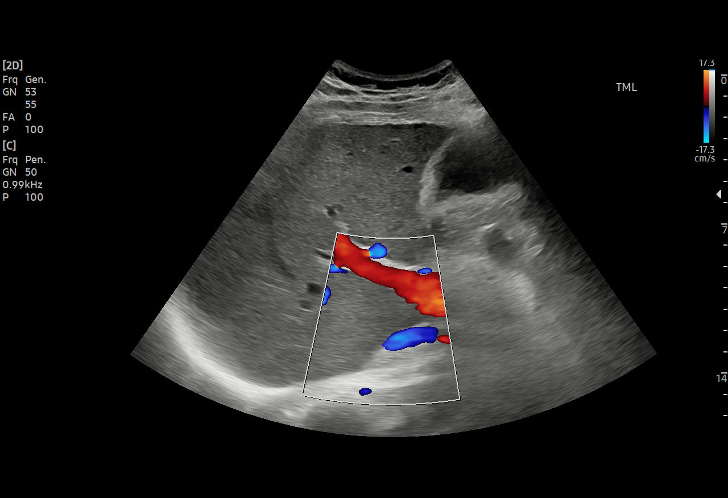
[im 70/70]
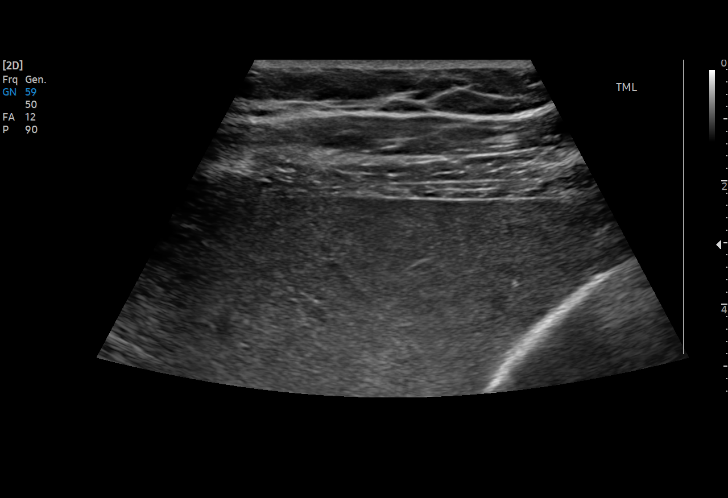

[15 of 25 positions shown; findings below may reference images not displayed]

FINDINGS: Gallbladder:

There is 2 cm echogenic focus with acoustic shadowing in the neck of
the gallbladder. Sludge is seen in the lumen of the gallbladder.
Technologist did not observe any tenderness over the gallbladder.
There is no fluid around the gallbladder.

Common bile duct:

Diameter: 2.3 mm

Liver:

No focal lesion identified. Within normal limits in parenchymal
echogenicity. Portal vein is patent on color Doppler imaging with
normal direction of blood flow towards the liver.

Other: None.
IMPRESSION: There is 2 cm hyperechoic focus with acoustic shadowing in the neck
of the gallbladder suggesting gallbladder stone. Sludge is seen in
the lumen of the gallbladder. There are no sonographic signs of
acute cholecystitis. There is no dilation of bile ducts.

## 2021-03-27 IMAGING — DX DG CHEST 1V PORT
1 series · 1 of 1 positions shown · non-contrast
Comparison: None.

CLINICAL DATA: Epigastric pain, vomiting

EXAM:
PORTABLE CHEST 1 VIEW

[chest ap]
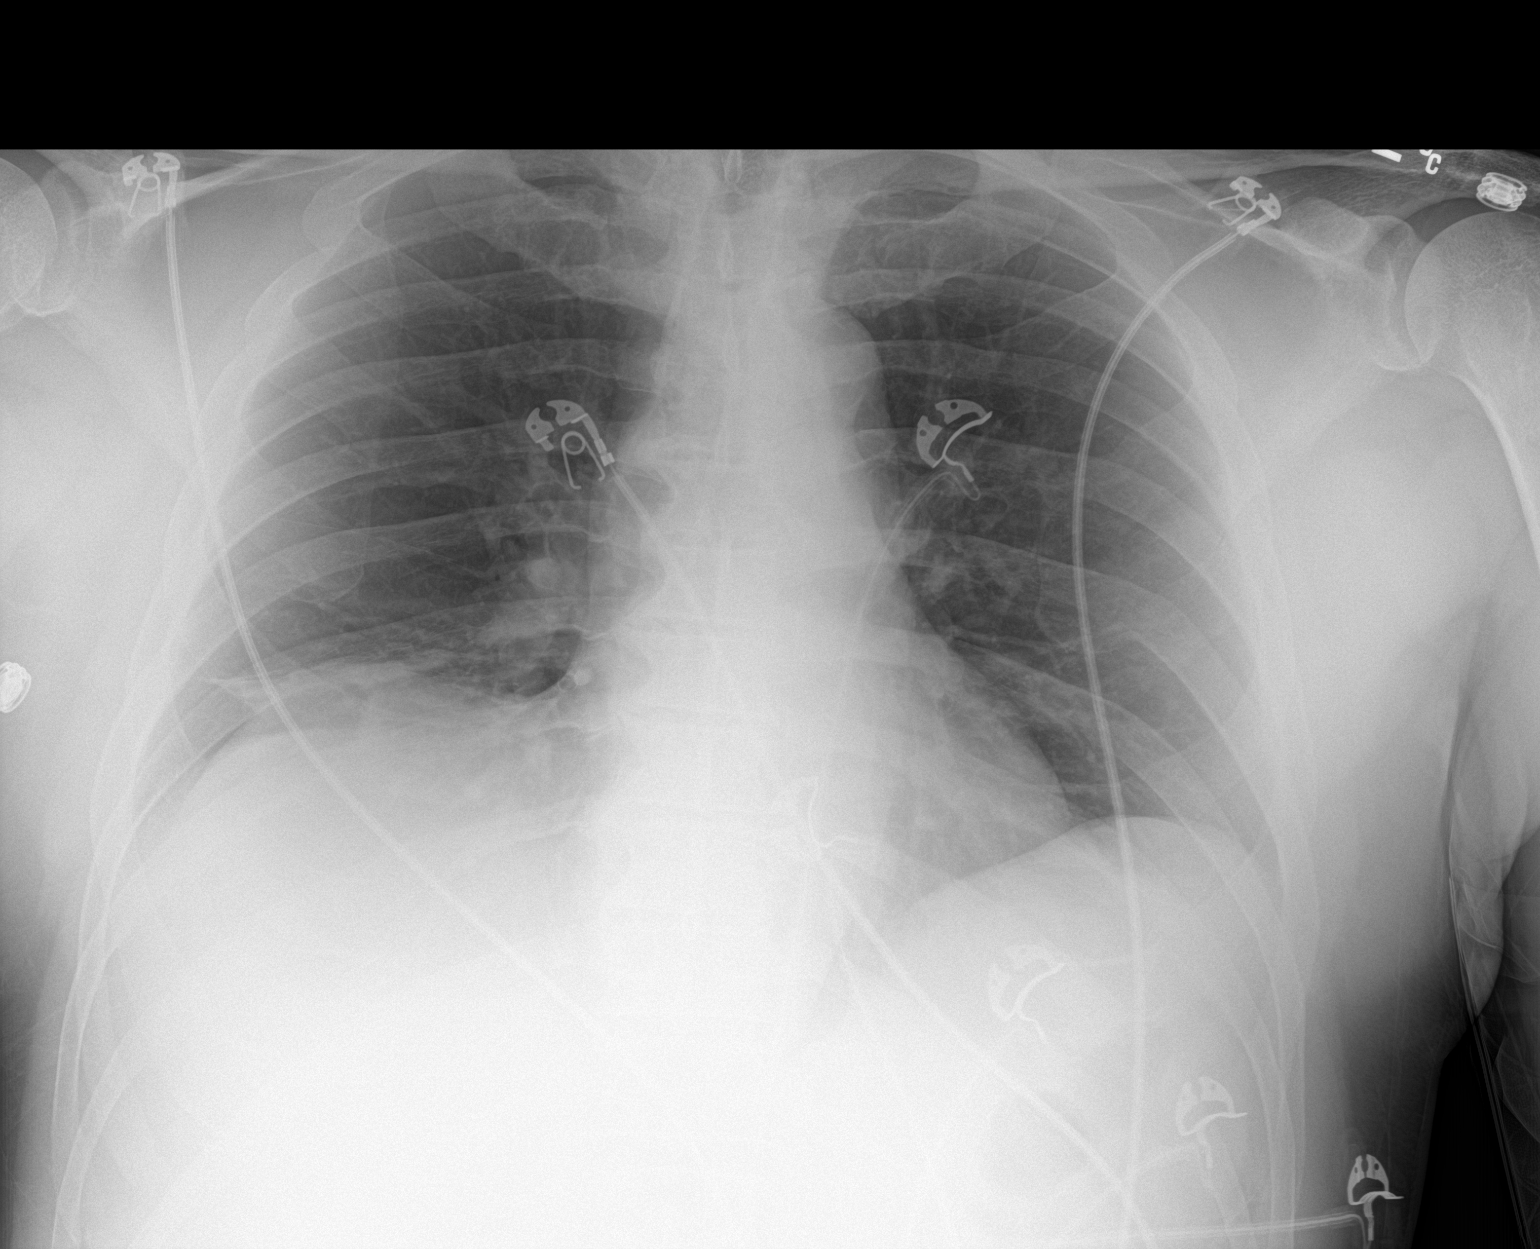

[1 of 1 positions shown; findings below may reference images not displayed]

FINDINGS: Cardiac size is within normal limits. There are no signs of
pulmonary edema. There are linear densities in the right lower lung
fields. There is no pleural effusion or pneumothorax.
IMPRESSION: Linear densities in right lower lung fields suggest scarring or
subsegmental atelectasis/pneumonia.

## 2021-03-27 MED ORDER — ACETAMINOPHEN 650 MG RE SUPP: 650.0000 mg | Freq: Four times a day (QID) | RECTAL | Status: DC | PRN

## 2021-03-27 MED ORDER — HYDROMORPHONE HCL 1 MG/ML IJ SOLN
1.0000 mg | Freq: Once | INTRAMUSCULAR | Status: AC
  Administered 2021-03-27: 17:00:00 1 mg via INTRAVENOUS
  Filled 2021-03-27: qty 1

## 2021-03-27 MED ORDER — FENTANYL CITRATE PF 50 MCG/ML IJ SOSY: 12.5000 ug | PREFILLED_SYRINGE | INTRAMUSCULAR | Status: DC | PRN

## 2021-03-27 MED ORDER — PANTOPRAZOLE SODIUM 40 MG IV SOLR
40.0000 mg | Freq: Once | INTRAVENOUS | Status: AC
  Administered 2021-03-27: 17:00:00 40 mg via INTRAVENOUS
  Filled 2021-03-27: qty 40

## 2021-03-27 MED ORDER — ACETAMINOPHEN 325 MG PO TABS
650.0000 mg | ORAL_TABLET | Freq: Four times a day (QID) | ORAL | Status: DC | PRN
  Administered 2021-03-29 (×2): 650 mg via ORAL
  Filled 2021-03-27 (×2): qty 2

## 2021-03-27 MED ORDER — PIPERACILLIN-TAZOBACTAM 3.375 G IVPB 30 MIN
3.3750 g | Freq: Once | INTRAVENOUS | Status: AC
Start: 2021-03-27 — End: 2021-03-27
  Administered 2021-03-27: 18:00:00 3.375 g via INTRAVENOUS
  Filled 2021-03-27: qty 50

## 2021-03-27 MED ORDER — LACTATED RINGERS IV BOLUS (SEPSIS)
1500.0000 mL | Freq: Once | INTRAVENOUS | Status: AC
  Administered 2021-03-27: 18:00:00 1500 mL via INTRAVENOUS

## 2021-03-27 MED ORDER — SODIUM CHLORIDE 0.9 % IV BOLUS
1000.0000 mL | Freq: Once | INTRAVENOUS | Status: AC
  Administered 2021-03-27: 17:00:00 1000 mL via INTRAVENOUS

## 2021-03-27 MED ORDER — SODIUM CHLORIDE 0.9 % IV SOLN: 75.0000 mL/h | INTRAVENOUS | Status: DC

## 2021-03-27 MED ORDER — MAGNESIUM SULFATE 2 GM/50ML IV SOLN
2.0000 g | Freq: Once | INTRAVENOUS | Status: AC
  Administered 2021-03-27: 18:00:00 2 g via INTRAVENOUS
  Filled 2021-03-27: qty 50

## 2021-03-27 MED ORDER — PIPERACILLIN-TAZOBACTAM 3.375 G IVPB
3.3750 g | Freq: Three times a day (TID) | INTRAVENOUS | Status: DC
  Administered 2021-03-28 (×2): 3.375 g via INTRAVENOUS
  Filled 2021-03-27 (×5): qty 50

## 2021-03-27 MED ORDER — ONDANSETRON HCL 4 MG/2ML IJ SOLN
4.0000 mg | Freq: Once | INTRAMUSCULAR | Status: AC
  Administered 2021-03-27: 17:00:00 4 mg via INTRAVENOUS
  Filled 2021-03-27: qty 2

## 2021-03-27 MED ORDER — PANTOPRAZOLE SODIUM 40 MG IV SOLR
40.0000 mg | Freq: Two times a day (BID) | INTRAVENOUS | Status: DC
  Administered 2021-03-28 – 2021-03-31 (×8): 40 mg via INTRAVENOUS
  Filled 2021-03-27 (×8): qty 40

## 2021-03-27 MED ORDER — SODIUM CHLORIDE 0.9 % IV SOLN: INTRAVENOUS | Status: AC

## 2021-03-27 MED ORDER — ATORVASTATIN CALCIUM 10 MG PO TABS
10.0000 mg | ORAL_TABLET | Freq: Every day | ORAL | Status: DC
  Administered 2021-03-27 – 2021-03-31 (×4): 10 mg via ORAL
  Filled 2021-03-27 (×5): qty 1

## 2021-03-27 MED ORDER — HYDROCODONE-ACETAMINOPHEN 5-325 MG PO TABS: 1.0000 | ORAL_TABLET | ORAL | Status: DC | PRN

## 2021-03-27 MED ORDER — LACTATED RINGERS IV SOLN: INTRAVENOUS | Status: DC

## 2021-03-27 MED ORDER — ACETAMINOPHEN 325 MG PO TABS
650.0000 mg | ORAL_TABLET | Freq: Once | ORAL | Status: AC
  Administered 2021-03-27: 17:00:00 650 mg via ORAL

## 2021-03-27 NOTE — ED Notes (Signed)
Consult call placed for call back to [facility] MD from Dr. Collene Mares [phone]

## 2021-03-27 NOTE — Progress Notes (Signed)
Pharmacy Antibiotic Note  Juan Snow is a 66 y.o. male admitted on 03/27/2021 with abdominal pain.  US abdomen showed a 2cm hyperechoic focus with acoustic shadowing in the neck of the gallbladder suggesting gallbladder stone.  Sludge was also seen in the lumen on the gallbladder.  Pharmacy has been consulted for zosyn dosing.  Tm 100.8, WBC 13.9, LA 2.2. Received zosyn x1 in ED @ 1809. With current Scr 1.4, estimated CrCl ~64 mL/min  Plan: Zosyn 3.375g IV q8h (4 hour infusion). Monitor clinical improvement, antibiotic length of therapy, ability to narrow antibiotics     Temp (24hrs), Avg:100.8 F (38.2 C), Min:100.8 F (38.2 C), Max:100.8 F (38.2 C)  Recent Labs  Lab 03/27/21 1658  WBC 13.9*  CREATININE 1.40*  LATICACIDVEN 2.2*    CrCl cannot be calculated (Unknown ideal weight.).    No Known Allergies  Antimicrobials this admission: Zosyn 1/24 >>   Dose adjustments this admission:  Microbiology results: 1/24 BCx:  1/24 Resp panel: neg COVID, neg flu A/B  Thank you for allowing pharmacy to be a part of this patients care.  Dimple Nanas, PharmD 03/27/2021 8:26 PM

## 2021-03-27 NOTE — Subjective & Objective (Signed)
Presents with Abd pain for the past 3days nausea one episode of vomiitng Febrile 100.8

## 2021-03-27 NOTE — ED Provider Triage Note (Signed)
Emergency Medicine Provider Triage Evaluation Note  Juan Snow , a 66 y.o. male  was evaluated in triage.  Pt complains of abdominal pain, black stools, since Saturday and N/V that started yesterday.  Similar presentation several years prior  Review of Systems  Positive: N/V, epigastric abdominal pain, melena Negative: Loss of consciousness  Physical Exam  BP (!) 153/109 (BP Location: Right Arm)    Pulse (!) 118    Temp (!) 100.8 F (38.2 C) (Oral)    Resp 20    SpO2 95%  Gen:   Awake, no distress   Resp:  Normal effort  MSK:   Moves extremities without difficulty  Other:  Febrile and tachycardic with HTN, TTP of epigastric region  Medical Decision Making  Medically screening exam initiated at 3:54 PM.  Appropriate orders placed.  Worthy Keeler was informed that the remainder of the evaluation will be completed by another provider, this initial triage assessment does not replace that evaluation, and the importance of remaining in the ED until their evaluation is complete.  Ordered labs and ultrasound   Franchot Heidelberg, PA-C 03/27/21 1604

## 2021-03-27 NOTE — Sepsis Progress Note (Signed)
Notified bedside nurse of need to draw repeat lactic acid. 

## 2021-03-27 NOTE — ED Triage Notes (Signed)
Per pt, states epigastric pain with nausea and vomiting on and off since Saturday-increase discomfort with PO intake-history of gallbladder issues 2 years ago

## 2021-03-27 NOTE — ED Provider Notes (Signed)
[facility] DEPT Provider Note   CSN: [id number] Arrival date & time: 03/27/21  1458     History  Chief Complaint  Patient presents with   Abdominal Pain    Juan Snow is a 66 y.o. male.  He has a history of high cholesterol.  He was told he had gallstones and sludge a few years ago.  Started having problems 3 days ago with nausea and reflux and subxiphoid abdominal pain.  Continue to worsen over the next 2 days.  Does not radiate into her shoulder or back.  Had 1 episode of vomiting.  No diarrhea or constipation.  Low-grade fevers and chills.  Patient is not on any blood thinners.  The history is provided by the patient.  Abdominal Pain Pain location:  Epigastric Pain quality: burning   Pain radiates to:  Does not radiate Pain severity:  Severe Onset quality:  Gradual Duration:  3 days Timing:  Constant Progression:  Unchanged Chronicity:  New Context: not sick contacts, not suspicious food intake and not trauma   Relieved by:  Nothing Worsened by:  Nothing Ineffective treatments:  OTC medications Associated symptoms: chills, fever, nausea and vomiting   Associated symptoms: no chest pain, no constipation, no cough, no diarrhea, no dysuria, no hematemesis, no hematochezia, no hematuria, no shortness of breath and no sore throat       Home Medications Prior to Admission medications   Medication Sig Start Date End Date Taking? Authorizing Provider  atorvastatin (LIPITOR) 10 MG tablet TAKE 1 TABLET BY MOUTH ONCE A DAY 12/29/19 12/28/20  Vernie Shanks, MD  atorvastatin (LIPITOR) 10 MG tablet Take 1 tablet by mouth once a day 07/06/20     atorvastatin (LIPITOR) 10 MG tablet Take 1 tablet (10 mg total) by mouth daily. 10/10/20     atorvastatin (LIPITOR) 10 MG tablet Take 1 tablet (10 mg total) by mouth daily. 01/11/21     esomeprazole (NEXIUM) 20 MG capsule Take 20 mg by mouth daily at 2 PM.    [provider]   HYDROcodone-acetaminophen (NORCO) 7.5-325 MG tablet Take 1 tablet by mouth every 4 (four) hours as needed for severe pain (pain score 7-10). 06/26/17   Porterfield, Amber, PA-C  methocarbamol (ROBAXIN) 500 MG tablet Take 1 tablet (500 mg total) by mouth every 6 (six) hours as needed for muscle spasms. 06/25/17   Porterfield, Museum/gallery conservator, PA-C  rivaroxaban (XARELTO) 10 MG TABS tablet Take 1 tablet (10 mg total) by mouth daily with breakfast. 06/26/17   Porterfield, Amber, PA-C  traMADol (ULTRAM) 50 MG tablet Take 1-2 tablets (50-100 mg total) by mouth every 6 (six) hours as needed for moderate pain. 06/26/17   Porterfield, Amber, PA-C      Allergies    Patient has no known allergies.    Review of Systems   Review of Systems  Constitutional:  Positive for chills and fever.  HENT:  Negative for sore throat.   Eyes:  Negative for visual disturbance.  Respiratory:  Negative for cough and shortness of breath.   Cardiovascular:  Negative for chest pain.  Gastrointestinal:  Positive for abdominal pain, nausea and vomiting. Negative for constipation, diarrhea, hematemesis and hematochezia.  Genitourinary:  Negative for dysuria and hematuria.  Musculoskeletal:  Negative for back pain.  Skin:  Negative for rash.  Neurological:  Negative for headaches.   Physical Exam Updated Vital Signs BP (!) 153/109 (BP Location: Right Arm)    Pulse (!) 118    Temp (!)  100.8 F (38.2 C) (Oral)    Resp 20    SpO2 95%  Physical Exam Vitals and nursing note reviewed.  Constitutional:      General: He is not in acute distress.    Appearance: Normal appearance. He is well-developed.  HENT:     Head: Normocephalic and atraumatic.  Eyes:     Conjunctiva/sclera: Conjunctivae normal.  Cardiovascular:     Rate and Rhythm: Regular rhythm. Tachycardia present.     Heart sounds: No murmur heard. Pulmonary:     Effort: Pulmonary effort is normal. No respiratory distress.     Breath sounds: Normal breath sounds.  Abdominal:      Palpations: Abdomen is soft.     Tenderness: There is abdominal tenderness in the epigastric area. There is no guarding or rebound. Negative signs include Murphy's sign.  Musculoskeletal:        General: No swelling. Normal range of motion.     Cervical back: Neck supple.     Right lower leg: No edema.     Left lower leg: No edema.  Skin:    General: Skin is warm and dry.     Capillary Refill: Capillary refill takes less than 2 seconds.  Neurological:     General: No focal deficit present.     Mental Status: He is alert.    ED Results / Procedures / Treatments   Labs (all labs ordered are listed, but only abnormal results are displayed) Labs Reviewed  BLOOD CULTURE ID PANEL (REFLEXED) - BCID2 - Abnormal; Notable for the following components:      Result Value   Enterobacterales DETECTED (*)    Escherichia coli DETECTED (*)    All other components within normal limits  COMPREHENSIVE METABOLIC PANEL - Abnormal; Notable for the following components:   Sodium 133 (*)    CO2 20 (*)    Glucose, Bld 155 (*)    Creatinine, Ser 1.40 (*)    ALT 77 (*)    Total Bilirubin 4.6 (*)    GFR, Estimated 56 (*)    All other components within normal limits  URINALYSIS, ROUTINE W REFLEX MICROSCOPIC - Abnormal; Notable for the following components:   Color, Urine AMBER (*)    APPearance HAZY (*)    Bilirubin Urine SMALL (*)    Ketones, ur 20 (*)    Protein, ur 100 (*)    All other components within normal limits  CBC WITH DIFFERENTIAL/PLATELET - Abnormal; Notable for the following components:   WBC 13.9 (*)    Hemoglobin 17.2 (*)    Platelets 117 (*)    Neutro Abs 13.4 (*)    Lymphs Abs 0.1 (*)    All other components within normal limits  LACTIC ACID, PLASMA - Abnormal; Notable for the following components:   Lactic Acid, Venous 2.2 (*)    All other components within normal limits  RAPID URINE DRUG SCREEN, HOSP PERFORMED - Abnormal; Notable for the following components:   Opiates  POSITIVE (*)    All other components within normal limits  ACETAMINOPHEN LEVEL - Abnormal; Notable for the following components:   Acetaminophen (Tylenol), Serum <10 (*)    All other components within normal limits  BLOOD GAS, VENOUS - Abnormal; Notable for the following components:   pCO2, Ven 38.6 (*)    pO2, Ven 69.1 (*)    All other components within normal limits  SALICYLATE LEVEL - Abnormal; Notable for the following components:   Salicylate Lvl <8.5 (*)  All other components within normal limits  D-DIMER, QUANTITATIVE - Abnormal; Notable for the following components:   D-Dimer, Quant 1.05 (*)    All other components within normal limits  BILIRUBIN, FRACTIONATED(TOT/DIR/INDIR) - Abnormal; Notable for the following components:   Total Bilirubin 5.4 (*)    Bilirubin, Direct 3.1 (*)    Indirect Bilirubin 2.3 (*)    All other components within normal limits  MAGNESIUM - Abnormal; Notable for the following components:   Magnesium 1.4 (*)    All other components within normal limits  CBC WITH DIFFERENTIAL/PLATELET - Abnormal; Notable for the following components:   Platelets 108 (*)    Neutro Abs 9.1 (*)    Lymphs Abs 0.2 (*)    All other components within normal limits  COMPREHENSIVE METABOLIC PANEL - Abnormal; Notable for the following components:   Potassium 3.0 (*)    Chloride 118 (*)    CO2 18 (*)    Calcium 5.8 (*)    Total Protein 3.9 (*)    Albumin 2.0 (*)    ALT 45 (*)    Total Bilirubin 3.9 (*)    All other components within normal limits  TROPONIN I (HIGH SENSITIVITY) - Abnormal; Notable for the following components:   Troponin I (High Sensitivity) 18 (*)    All other components within normal limits  CULTURE, BLOOD (ROUTINE X 2)  CULTURE, BLOOD (ROUTINE X 2)  RESP PANEL BY RT-PCR (FLU A&B, COVID) ARPGX2  EXPECTORATED SPUTUM ASSESSMENT W GRAM STAIN, RFLX TO RESP C  LIPASE, BLOOD  LACTIC ACID, PLASMA  MAGNESIUM  LACTIC ACID, PLASMA  LACTIC ACID, PLASMA   PROCALCITONIN  CK  PHOSPHORUS  OSMOLALITY, URINE  SODIUM, URINE, RANDOM  TSH  CREATININE, URINE, RANDOM  PHOSPHORUS  STREP PNEUMONIAE URINARY ANTIGEN  OSMOLALITY  HIV ANTIBODY (ROUTINE TESTING W REFLEX)  LEGIONELLA PNEUMOPHILA SEROGP 1 UR AG  COMPREHENSIVE METABOLIC PANEL  I-STAT CHEM 8, ED  TYPE AND SCREEN  TROPONIN I (HIGH SENSITIVITY)    EKG EKG Interpretation  Date/Time:  Tuesday March 27 2021 15:55:27 EST Ventricular Rate:  126 PR Interval:  44 QRS Duration: 91 QT Interval:  452 QTC Calculation: 655 R Axis:   88 Text Interpretation: Sinus tachycardia Atrial premature complex Borderline right axis deviation Abnormal R-wave progression, late transition Prolonged QT interval Since last tracing Rate faster QT has lengthened Confirmed by Calvert Cantor [phone]) on 03/27/2021 4:02:42 PM  Radiology DG Chest Port 1 View  Result Date: 03/27/2021 CLINICAL DATA:  Epigastric pain, vomiting EXAM: PORTABLE CHEST 1 VIEW COMPARISON:  None. FINDINGS: Cardiac size is within normal limits. There are no signs of pulmonary edema. There are linear densities in the right lower lung fields. There is no pleural effusion or pneumothorax. IMPRESSION: Linear densities in right lower lung fields suggest scarring or subsegmental atelectasis/pneumonia. Electronically Signed   By: Elmer Picker M.D.   On: 03/27/2021 17:43   US Abdomen Limited RUQ (LIVER/GB)  Result Date: 03/27/2021 CLINICAL DATA:  Abdominal pain EXAM: ULTRASOUND ABDOMEN LIMITED RIGHT UPPER QUADRANT COMPARISON:  None. FINDINGS: Gallbladder: There is 2 cm echogenic focus with acoustic shadowing in the neck of the gallbladder. Sludge is seen in the lumen of the gallbladder. Technologist did not observe any tenderness over the gallbladder. There is no fluid around the gallbladder. Common bile duct: Diameter: 2.3 mm Liver: No focal lesion identified. Within normal limits in parenchymal echogenicity. Portal vein is patent on color  Doppler imaging with normal direction of blood flow towards the liver.  Other: None. IMPRESSION: There is 2 cm hyperechoic focus with acoustic shadowing in the neck of the gallbladder suggesting gallbladder stone. Sludge is seen in the lumen of the gallbladder. There are no sonographic signs of acute cholecystitis. There is no dilation of bile ducts. Electronically Signed   By: Elmer Picker M.D.   On: 03/27/2021 18:05    Procedures .Critical Care Performed by: Hayden Rasmussen, MD Authorized by: Hayden Rasmussen, MD   Critical care provider statement:    Critical care time (minutes):  45   Critical care time was exclusive of:  Separately billable procedures and treating other patients   Critical care was necessary to treat or prevent imminent or life-threatening deterioration of the following conditions:  Sepsis   Critical care was time spent personally by me on the following activities:  Development of treatment plan with patient or surrogate, discussions with consultants, evaluation of patient's response to treatment, examination of patient, obtaining history from patient or surrogate, ordering and performing treatments and interventions, ordering and review of laboratory studies, ordering and review of radiographic studies, pulse oximetry and re-evaluation of patient's condition   I assumed direction of critical care for this patient from another provider in my specialty: no      Medications Ordered in ED Medications  atorvastatin (LIPITOR) tablet 10 mg (10 mg Oral Given 03/27/21 2319)  acetaminophen (TYLENOL) tablet 650 mg (has no administration in time range)    Or  acetaminophen (TYLENOL) suppository 650 mg (has no administration in time range)  HYDROcodone-acetaminophen (NORCO/VICODIN) 5-325 MG per tablet 1-2 tablet (has no administration in time range)  fentaNYL (SUBLIMAZE) injection 12.5-50 mcg (has no administration in time range)  piperacillin-tazobactam (ZOSYN) IVPB 3.375 g  (0 g Intravenous Stopped 03/28/21 0713)  0.9 %  sodium chloride infusion (0 mLs Intravenous Stopped 03/28/21 0903)  pantoprazole (PROTONIX) injection 40 mg (40 mg Intravenous Given 03/28/21 0115)  magnesium sulfate IVPB 2 g 50 mL (2 g Intravenous New Bag/Given 03/28/21 0819)  potassium chloride (KLOR-CON) packet 40 mEq (has no administration in time range)  dextrose 5 %-0.45 % sodium chloride infusion (has no administration in time range)  sodium chloride 0.9 % bolus 1,000 mL (0 mLs Intravenous Stopped 03/27/21 1810)  HYDROmorphone (DILAUDID) injection 1 mg (1 mg Intravenous Given 03/27/21 1702)  ondansetron (ZOFRAN) injection 4 mg (4 mg Intravenous Given 03/27/21 1702)  acetaminophen (TYLENOL) tablet 650 mg (650 mg Oral Given 03/27/21 1703)  pantoprazole (PROTONIX) injection 40 mg (40 mg Intravenous Given 03/27/21 1702)  magnesium sulfate IVPB 2 g 50 mL (0 g Intravenous Stopped 03/27/21 1833)  piperacillin-tazobactam (ZOSYN) IVPB 3.375 g (0 g Intravenous Stopped 03/27/21 1843)  lactated ringers bolus 1,500 mL (0 mLs Intravenous Stopped 03/27/21 2057)    ED Course/ Medical Decision Making/ A&P Clinical Course as of 03/28/21 0904  Tue Mar 27, 2021  1614 EKG with sinus tachycardia and prolonged QTC [MB]  2952 Chest x-ray interpreted by me as atelectasis versus infiltrate right lower lobe. [MB]  1752 Tachycardia improved.  Does have a white count and elevated creatinine. [MB]  8413 Discussed with Dr. Georgette Dover general surgery.  Recommends keeping him n.p.o.  Needs a medicine admission and GI work-up.  They will see him tomorrow. [MB]  [id number] Patient reassessed and updated on results.  His tachycardia is resolved and his pain is well controlled.  He looks much more comfortable. [MB]  1945 Discussed with [facility] Dr. Alessandra Bevels.  He is recommending MRI MRCP, n.p.o., Zosyn.  They  will see in the morning. [MB]    Clinical Course User Index [MB] Hayden Rasmussen, MD                           Medical Decision  Making Amount and/or Complexity of Data Reviewed Labs: ordered. Radiology: ordered.  Risk OTC drugs. Prescription drug management. Decision regarding hospitalization.  Juan Snow was evaluated in Emergency Department on 03/27/2021 for the symptoms described in the history of present illness. He was evaluated in the context of the global COVID-19 pandemic, which necessitated consideration that the patient might be at risk for infection with the SARS-CoV-2 virus that causes COVID-19. Institutional protocols and algorithms that pertain to the evaluation of patients at risk for COVID-19 are in a state of rapid change based on information released by regulatory bodies including the CDC and federal and state organizations. These policies and algorithms were followed during the patient's care in the ED.  This patient complains of upper abdominal pain fever and chills; this involves an extensive number of treatment Options and is a complaint that carries with it a high risk of complications and Morbidity. The differential includes cholelithiasis, cholecystitis, peptic ulcer disease, pneumonia aspiration, COVID, flu, perforation  I ordered, reviewed and interpreted labs, which included CBC with elevated white count, hemoglobin stable, chemistries with low bicarbonate elevated creatinine reflecting some dehydration, LFTs normal although bilirubin elevated, COVID and flu negative, blood culture sent I ordered medication IV fluids and pain medication nausea medication, IV antibiotics I ordered imaging studies which included chest x-ray and right upper quadrant ultrasound and I independently    visualized and interpreted imaging which showed possible right lower lobe pneumonia versus atelectasis, ultrasound with gallstone and sludge no acute cholecystitis Additional history obtained from patient's wife Previous records obtained and reviewed in epic including prior imaging I consulted Dr. Georgette Dover general  surgery Dr. Alessandra Bevels GI, Dr. Roel Cluck [facility] hospital[facility] and discussed lab and imaging findings  Critical Interventions: Work-up and management of patient's SIRS/sepsis with early antibiotics and fluid hydration  After the interventions stated above, I reevaluated the patient and found patient to be clinically improved.  Heart rate normalizing.  Patient will need to be admitted for further work-up of his symptoms.  Patient in agreement with plan.          Final Clinical Impression(s) / ED Diagnoses Final diagnoses:  Abdominal pain  QT prolongation  Sepsis with acute renal failure without septic shock, due to unspecified organism, unspecified acute renal failure type [facility])    Rx / DC Orders ED Discharge Orders     None         Hayden Rasmussen, MD 03/28/21 [phone]

## 2021-03-27 NOTE — H&P (Signed)
DANIAL HLAVAC VKP:[id number] DOB: January 12, 1956 DOA: 03/27/2021     PCP: Vernie Shanks, MD   Outpatient Specialists:     GI Dr. Collene Mares        Patient arrived to ER on 03/27/21 at 1458 Referred by Attending Hayden Rasmussen, MD   Patient coming from: home Lives With family    Chief Complaint:   Chief Complaint  Patient presents with   Abdominal Pain    HPI: Juan Snow is a 66 y.o. male with medical history significant of HLD, GERD    Presented with   3 days of abd pain fever and chills Presents with Abd pain for the past 3days nausea one episode of vomiitng Febrile 100.8 Reports today when he drank some water the pain ws really bad Have not been able to eat since Sunday He had baked chicken and mashed potatoes everyone has been eating same At 3 OM he had indigestion type of pain with burning and belching Wife tried to give peptobismol and Pepcid He have not had an appetite Did well with apples sauce Tried herbal tea was ok but today with sips of water pain returned Was so sever he was doubling over Pain is coming and going no diarrhea  One episode of vomiting No blood in stool or vomit Of note last week he stopped taking Nexium and started to used apple cider vinegar mixed with water cinnamon and ginger one table spoon he has not done it since thursday  His GERD is severe it wakes him up at night bubbling up and burning his throat Wife thinks he has aspirated some because he was coughing and coughing  Had a colonoscopy with Dr. Collene Mares years back had one polyp He has not been able to keep his NExium down No tobacco no ETOH Takes Advil very rare, was taking tylenol for pain this week   Has   been vaccinated against COVID    had flu shot   Initial COVID TEST  NEGATIVE   Lab Results  Component Value Date   [place] NEGATIVE 03/27/2021     Regarding pertinent Chronic problems:     Hyperlipidemia -  on statins Lipitor   GERD on nexium    While in  ER: Clinical Course as of 03/27/21 2003  Tue Mar 27, 2021  1614 EKG with sinus tachycardia and prolonged QTC [MB]  1746 Chest x-ray interpreted by me as atelectasis versus infiltrate right lower lobe. [MB]  1752 Tachycardia improved.  Does have a white count and elevated creatinine. [MB]  0511 Discussed with Dr. Georgette Dover general surgery.  Recommends keeping him n.p.o.  Needs a medicine admission and GI work-up.  They will see him tomorrow. [MB]  [id number] Patient reassessed and updated on results.  His tachycardia is resolved and his pain is well controlled.  He looks much more comfortable. [MB]  1945 Discussed with [facility] Dr. Alessandra Bevels.  He is recommending MRI MRCP, n.p.o., Zosyn.  They will see in the morning. [MB]    Clinical Course User Index [MB] Hayden Rasmussen, MD    Prolonged QT   Ordered US showed a stone in the neck no chole    CXR - RLL atelectasis vs scaring   RUQ US showed gall stone no cholecystitis  Following Medications were ordered in ER: Medications  lactated ringers infusion (has no administration in time range)  sodium chloride 0.9 % bolus 1,000 mL (0 mLs Intravenous Stopped 03/27/21 1810)  HYDROmorphone (DILAUDID)  injection 1 mg (1 mg Intravenous Given 03/27/21 1702)  ondansetron (ZOFRAN) injection 4 mg (4 mg Intravenous Given 03/27/21 1702)  acetaminophen (TYLENOL) tablet 650 mg (650 mg Oral Given 03/27/21 1703)  pantoprazole (PROTONIX) injection 40 mg (40 mg Intravenous Given 03/27/21 1702)  magnesium sulfate IVPB 2 g 50 mL (0 g Intravenous Stopped 03/27/21 1833)  piperacillin-tazobactam (ZOSYN) IVPB 3.375 g (0 g Intravenous Stopped 03/27/21 1843)  lactated ringers bolus 1,500 mL (1,500 mLs Intravenous New Bag/Given 03/27/21 1811)    _______________________________________________________ ER Provider Called:   Jorge Ny  Dr. Molli Posey They Recommend admit to medicine  call gi Will see in AM     ER Provider Called:   Dr. Inda Coke   They Recommend admit to medicine    Will see in AM   MRI, MRCP, NPO started on ABX zosyn   ED Triage Vitals  Enc Vitals Group     BP 03/27/21 1543 (!) 153/109     Pulse Rate 03/27/21 1543 (!) 118     Resp 03/27/21 1543 20     Temp 03/27/21 1543 (!) 100.8 F (38.2 C)     Temp Source 03/27/21 1543 Oral     SpO2 03/27/21 1543 95 %     Weight --      Height --      Head Circumference --      Peak Flow --      Pain Score 03/27/21 1550 10     Pain Loc --      Pain Edu? --      Excl. in [place]? --   TMAX(24)@     _________________________________________ Significant initial  Findings: Abnormal Labs Reviewed  COMPREHENSIVE METABOLIC PANEL - Abnormal; Notable for the following components:      Result Value   Sodium 133 (*)    CO2 20 (*)    Glucose, Bld 155 (*)    Creatinine, Ser 1.40 (*)    ALT 77 (*)    Total Bilirubin 4.6 (*)    GFR, Estimated 56 (*)    All other components within normal limits  CBC WITH DIFFERENTIAL/PLATELET - Abnormal; Notable for the following components:   WBC 13.9 (*)    Hemoglobin 17.2 (*)    Platelets 117 (*)    Neutro Abs 13.4 (*)    Lymphs Abs 0.1 (*)    All other components within normal limits  LACTIC ACID, PLASMA - Abnormal; Notable for the following components:   Lactic Acid, Venous 2.2 (*)    All other components within normal limits    _________________________ Troponin 16 ECG: Ordered Personally reviewed by me showing: HR : 126 Rhythm: Sinus tachycardia   nonspecific changes,  QTC 665   ____________________ This patient meets SIRS Criteria and may be septic.     The recent clinical data is shown below. Vitals:   03/27/21 1800 03/27/21 1830 03/27/21 1900 03/27/21 1955  BP: 121/71 116/73 111/71 115/71  Pulse: 88 83 99 82  Resp: _0 (!) 26  Temp:      TempSrc:      SpO2: 91% 94% 91% 92%    WBC     Component Value Date/Time   WBC 13.9 (H) 03/27/2021 1658   LYMPHSABS 0.1 (L) 03/27/2021 1658   MONOABS 0.3 03/27/2021 1658   EOSABS 0.0 03/27/2021 1658    BASOSABS 0.0 03/27/2021 1658      Lactic Acid, Venous    Component Value Date/Time   LATICACIDVEN 2.2 (HH) 03/27/2021 1658  Procalcitonin  Ordered Lactic Acid, Venous    Component Value Date/Time   LATICACIDVEN 1.6 03/27/2021 2311      UA  evidence of UTI      Urine analysis:    Component Value Date/Time   COLORURINE AMBER (A) 03/27/2021 2037   APPEARANCEUR HAZY (A) 03/27/2021 2037   LABSPEC 1.029 03/27/2021 2037   PHURINE 5.0 03/27/2021 2037   GLUCOSEU NEGATIVE 03/27/2021 2037   HGBUR NEGATIVE 03/27/2021 2037   BILIRUBINUR SMALL (A) 03/27/2021 2037   KETONESUR 20 (A) 03/27/2021 2037   PROTEINUR 100 (A) 03/27/2021 2037   NITRITE NEGATIVE 03/27/2021 2037   LEUKOCYTESUR NEGATIVE 03/27/2021 2037    Results for orders placed or performed during the hospital encounter of 03/27/21  Resp Panel by RT-PCR (Flu A&B, Covid) Nasopharyngeal Swab     Status: None   Collection Time: 03/27/21  5:58 PM   Specimen: Nasopharyngeal Swab; Nasopharyngeal(NP) swabs in vial transport medium  Result Value Ref Range Status   SARS Coronavirus 2 by RT PCR NEGATIVE NEGATIVE Final         Influenza A by PCR NEGATIVE NEGATIVE Final   Influenza B by PCR NEGATIVE NEGATIVE Final          _______________________________________________ Hospitalist was called for admission for Chest pain, gall bladder disease  The following Work up has been ordered so far:  Orders Placed This Encounter  Procedures   Culture, blood (routine x 2)   Resp Panel by RT-PCR (Flu A&B, Covid) Nasopharyngeal Swab   US Abdomen Limited RUQ (LIVER/GB)   DG Chest Port 1 View   Lipase, blood   Comprehensive metabolic panel   Urinalysis, Routine w reflex microscopic   CBC with Differential   Lactic acid, plasma   Magnesium   Diet NPO time specified   DO NOT delay antibiotics if unable to obtain blood culture.   Code Sepsis activation.  This occurs automatically when order is signed and prioritizes pharmacy, lab, and  radiology services for STAT collections and interventions.  If [facility] downtime, call [facility] [phone]) to activate Code Sepsis.   pharmacy consult   Consult to general surgery  cholelithiasis   Consult to gastroenterology   Consult to hospitalist   I-stat chem 8, ED   ED EKG   EKG 12-Lead   Type and screen [place] 2nd peripheral IV if not already present.     OTHER Significant initial  Findings:  labs showing:    Recent Labs  Lab 03/27/21 1658 03/27/21 2311  NA 133*  --   K 3.9  --   CO2 20*  --   GLUCOSE 155*  --   BUN 20  --   CREATININE 1.40*  --   CALCIUM 9.2  --   MG 1.7  --   PHOS  --  4.2    Cr   Up from baseline see below Lab Results  Component Value Date   CREATININE 1.40 (H) 03/27/2021   CREATININE 1.08 06/26/2017    Recent Labs  Lab 03/27/21 1658  AST 39  ALT 77*  ALKPHOS 103  BILITOT 4.6*  PROT 7.1  ALBUMIN 4.0   Lab Results  Component Value Date   CALCIUM 9.2 03/27/2021          Plt: Lab Results  Component Value Date   PLT 117 (L) 03/27/2021       COVID-19 Labs  No results for input(s): DDIMER, FERRITIN, LDH, CRP in the last 72  hours.  Lab Results  Component Value Date   SARSCOV2NAA NEGATIVE 03/27/2021        Recent Labs  Lab 03/27/21 1658  WBC 13.9*  NEUTROABS 13.4*  HGB 17.2*  HCT 49.8  MCV 90.1  PLT 117*    HG/HCT  stable,      Component Value Date/Time   HGB 17.2 (H) 03/27/2021 1658   HCT 49.8 03/27/2021 1658   MCV 90.1 03/27/2021 1658      Recent Labs  Lab 03/27/21 1658  LIPASE 24   No results for input(s): AMMONIA in the last 168 hours.    Cardiac Panel (last 3 results) Recent Labs    03/27/21 2311  CKTOTAL 93        Cultures: No results found for: SDES, SPECREQUEST, CULT, REPTSTATUS   Radiological Exams on Admission: DG Chest Port 1 View  Result Date: 03/27/2021 CLINICAL DATA:  Epigastric pain, vomiting EXAM: PORTABLE CHEST 1 VIEW COMPARISON:  None.  FINDINGS: Cardiac size is within normal limits. There are no signs of pulmonary edema. There are linear densities in the right lower lung fields. There is no pleural effusion or pneumothorax. IMPRESSION: Linear densities in right lower lung fields suggest scarring or subsegmental atelectasis/pneumonia. Electronically Signed   By: Elmer Picker M.D.   On: 03/27/2021 17:43   US Abdomen Limited RUQ (LIVER/GB)  Result Date: 03/27/2021 CLINICAL DATA:  Abdominal pain EXAM: ULTRASOUND ABDOMEN LIMITED RIGHT UPPER QUADRANT COMPARISON:  None. FINDINGS: Gallbladder: There is 2 cm echogenic focus with acoustic shadowing in the neck of the gallbladder. Sludge is seen in the lumen of the gallbladder. Technologist did not observe any tenderness over the gallbladder. There is no fluid around the gallbladder. Common bile duct: Diameter: 2.3 mm Liver: No focal lesion identified. Within normal limits in parenchymal echogenicity. Portal vein is patent on color Doppler imaging with normal direction of blood flow towards the liver. Other: None. IMPRESSION: There is 2 cm hyperechoic focus with acoustic shadowing in the neck of the gallbladder suggesting gallbladder stone. Sludge is seen in the lumen of the gallbladder. There are no sonographic signs of acute cholecystitis. There is no dilation of bile ducts. Electronically Signed   By: Elmer Picker M.D.   On: 03/27/2021 18:05   _______________________________________________________________________________________________________ Latest   Blood pressure 115/71, pulse 82, temperature (!) 100.8 F (38.2 C), temperature source Oral, resp. rate (!) 26, SpO2 92 %.   Vitals  labs and radiology finding personally reviewed  Review of Systems:    Pertinent positives include:  Fevers, chills, fatigue,   Constitutional:  No weight loss, night sweats, weight loss  HEENT:  No headaches, Difficulty swallowing,Tooth/dental problems,Sore throat,  No sneezing, itching, ear  ache, nasal congestion, post nasal drip,  Cardio-vascular:  No chest pain, Orthopnea, PND, anasarca, dizziness, palpitations.no Bilateral lower extremity swelling  GI:  No heartburn, indigestion, abdominal pain, nausea, vomiting, diarrhea, change in bowel habits, loss of appetite, melena, blood in stool, hematemesis Resp:  no shortness of breath at rest. No dyspnea on exertion, No excess mucus, no productive cough, No non-productive cough, No coughing up of blood.No change in color of mucus.No wheezing. Skin:  no rash or lesions. No jaundice GU:  no dysuria, change in color of urine, no urgency or frequency. No straining to urinate.  No flank pain.  Musculoskeletal:  No joint pain or no joint swelling. No decreased range of motion. No back pain.  Psych:  No change in mood or affect. No depression or anxiety. No  memory loss.  Neuro: no localizing neurological complaints, no tingling, no weakness, no double vision, no gait abnormality, no slurred speech, no confusion  All systems reviewed and apart from [place] all are negative _______________________________________________________________________________________________ Past Medical History:   Past Medical History:  Diagnosis Date   Arthritis    OA   Cough    WHITE SPUTUM OCC, NO FEVER COLD SYMPTOMS MAINLY   GERD (gastroesophageal reflux disease)      Past Surgical History:  Procedure Laterality Date   COLONOSCOPY  06/17/2011   Procedure: COLONOSCOPY;  Surgeon: [name], MD;  Location: [facility];  Service: Endoscopy;  Laterality: N/A;   KNEE SURGERY Right YRS AGO   MENISCUS   THUMB ARTHROSCOPY Right YRS AGO   TOTAL HIP ARTHROPLASTY Left 06/25/2017   Procedure: LEFT TOTAL HIP ARTHROPLASTY ANTERIOR APPROACH;  Surgeon: Gaynelle Arabian, MD;  Location: [facility]S;  Service: Orthopedics;  Laterality: Left;    Social History:  Ambulatory   independently       reports that he has never smoked. He has never used smokeless tobacco. He  reports that he does not drink alcohol and does not use drugs.     Family History: hx CAD    Family History  Problem Relation Age of Onset   CAD Brother    Hypertension Other    Diabetes Neg Hx    ______________________________________________________________________________________________ Allergies: No Known Allergies   Prior to Admission medications   Medication Sig Start Date End Date Taking? Authorizing Provider  atorvastatin (LIPITOR) 10 MG tablet TAKE 1 TABLET BY MOUTH ONCE A DAY 12/29/19 12/28/20  Vernie Shanks, MD  atorvastatin (LIPITOR) 10 MG tablet Take 1 tablet by mouth once a day 07/06/20     atorvastatin (LIPITOR) 10 MG tablet Take 1 tablet (10 mg total) by mouth daily. 10/10/20     atorvastatin (LIPITOR) 10 MG tablet Take 1 tablet (10 mg total) by mouth daily. 01/11/21     esomeprazole (NEXIUM) 20 MG capsule Take 20 mg by mouth daily at 2 PM.    [provider]  HYDROcodone-acetaminophen (NORCO) 7.5-325 MG tablet Take 1 tablet by mouth every 4 (four) hours as needed for severe pain (pain score 7-10). 06/26/17   Porterfield, Amber, PA-C  methocarbamol (ROBAXIN) 500 MG tablet Take 1 tablet (500 mg total) by mouth every 6 (six) hours as needed for muscle spasms. 06/25/17   Porterfield, Museum/gallery conservator, PA-C  rivaroxaban (XARELTO) 10 MG TABS tablet Take 1 tablet (10 mg total) by mouth daily with breakfast. 06/26/17   Porterfield, Amber, PA-C  traMADol (ULTRAM) 50 MG tablet Take 1-2 tablets (50-100 mg total) by mouth every 6 (six) hours as needed for moderate pain. 06/26/17   Porterfield, Amber, PA-C    ___________________________________________________________________________________________________ Physical Exam: Vitals with BMI 03/27/2021 03/27/2021 03/27/2021  Height - - -  Weight - - -  BMI - - -  Systolic 009 381 829  Diastolic 71 71 73  Pulse 82 99 83     1. General:  in No  Acute distress   Chronically ill   -appearing 2. Psychological: Alert and Oriented 3.  Head/ENT:    Dry Mucous Membranes                          Head Non traumatic, neck supple                        Poor Dentition 4. SKIN:  decreased Skin turgor,  Skin clean Dry and intact no rash 5. Heart: Regular rate and rhythm no  Murmur, no Rub or gallop 6. Lungs:  no wheezes or crackles   7. Abdomen: Soft,  epigastric tender n murphy sign  Non distended  bowel sounds diminished 8. Lower extremities: no clubbing, cyanosis, no  edema 9. Neurologically Grossly intact, moving all 4 extremities equally   10. MSK: Normal range of motion    Chart has been reviewed  ______________________________________________________________________________________________  Assessment/Plan 66 y.o. male with medical history significant of HLD, GERD  Admitted for Sepsis and possible gastritis/gallstone colic questionable aspiration pneumonia  Present on Admission:  AKI (acute kidney injury) ([place])  Sepsis ([place])  Hypomagnesemia  Chest pain  Prolonged QT interval  Gall bladder stones  Aspiration pneumonia ([place])     Sepsis ([place])  -SIRS criteria met with  elevated white blood cell count,       Component Value Date/Time   WBC 13.9 (H) 03/27/2021 1658   LYMPHSABS 0.1 (L) 03/27/2021 1658    fever RR >20  Today's Vitals   03/27/21 1958 03/27/21 2039 03/27/21 2057 03/27/21 2300  BP:    116/80  Pulse:    77  Resp:    (!) 24  Temp:    98.9 F (37.2 C)  TempSrc:    Oral  SpO2:    96%  Weight:  85.3 kg 85.3 kg   Height:   _0  (1.727 m)   PainSc: 5        The recent clinical data is shown below. Vitals:   03/27/21 1955 03/27/21 2039 03/27/21 2057 03/27/21 2300  BP: 115/71   116/80  Pulse: 82   77  Resp: (!) 26   (!) 24  Temp:    98.9 F (37.2 C)  TempSrc:    Oral  SpO2: 92%   96%  Weight:  85.3 kg 85.3 kg   Height:   _1  (1.727 m)      -Most likely source being: , pulmonary vs intra-abdominal,        - Obtain serial lactic acid and procalcitonin level.  - Initiated IV  antibiotics in ER: Antibiotics Given (last 72 hours)     Date/Time Action Medication Dose Rate   03/27/21 1809 New Bag/Given   piperacillin-tazobactam (ZOSYN) IVPB 3.375 g 3.375 g 100 mL/hr      Will continue      - await results of blood and urine culture  - Rehydrate aggressively  Intravenous fluids were administered      11:58 PM   AKI (acute kidney injury) ([place]) Secondary to dehydration will rehydrate and follow urine status  Hypomagnesemia Will replace and recheck  Chest pain More of epigastric pain.  Worse with eating suggestive of heartburn troponin unremarkable.  Noted elevated D-dimer.  At this point given AKI will hold off on CTA for tonight as symptoms consistent with PE. Will check Dopplers If tomorrow renal function improves and there is still suspicion of possibility of PE would obtain CTA  Prolonged QT interval - will monitor on tele avoid QT prolonging medications, rehydrate correct electrolytes Will repeat EKG once heart rate down. Correct electrolytes  Gall bladder stones Discussed with Dr. Collene Mares with GI.  Will see patient in consult in AM.  Restart Protonix abdominal pain most suggestive of GERD/dyspepsia/gastritis.  Lipase unremarkable.  May need HIDA scan in a.m. will defer to GI.  Aspiration pneumonia [facility]) Wife reports episode of significant coughing after severe heartburn  episode with a sensation of acid coming up to his throat and that he is choking on it.  Chest x-ray showing questionable right lower lobe infiltrate.  Given low-grade fever.  For now cover with Zosyn and continue to monitor. Continue PPI    Other plan as per orders.  DVT prophylaxis:  SCD      Code Status:    Code Status: Prior FULL CODE as per patient   I had personally discussed CODE STATUS with patient     Family Communication:   Family not at  Bedside  plan of care was discussed  with   Wife,    Disposition Plan:         To home once workup is complete and patient is  stable   Following barriers for discharge:                            Electrolytes corrected                                                         Pain controlled with PO medications                               Afebrile, white count improving able to transition to PO antibiotics                             Will need to be able to tolerate PO                                               Will need consultants to evaluate patient prior to discharge            Consults called:   GI is aware , Dr. Collene Mares will see in a.m. General surgery is aware  Admission status:  ED Disposition     ED Disposition  Admit   Condition  --   [place]: [place] [[id number]]  Level of Care: Progressive [102]  Admit to Progressive based on following criteria: MULTISYSTEM THREATS such as stable sepsis, metabolic/electrolyte imbalance with or without encephalopathy that is responding to early treatment.  May place patient in observation at [facility] or [place] if equivalent level of care is available:: No  Covid Evaluation: Confirmed COVID Negative  Diagnosis: Sepsis [facility]) [[id number]]  Admitting Physician: Toy Baker [[id number]]  Attending Physician: Toy Baker [[id number]]           Obs     Level of care      progressive tele indefinitely please discontinue once patient no longer qualifies COVID-19 Labs    Lab Results  Component Value Date   [place] 03/27/2021     Precautions: admitted as   Covid Negative       Vonetta Foulk 03/28/2021, 12:14 AM    [facility]     after 2 AM please page floor coverage PA If 7AM-7PM, please contact the day team taking care of the patient using [email or web address]   Patient was evaluated  in the context of the global COVID-19 pandemic, which necessitated consideration that the patient might be at risk for infection with the SARS-CoV-2 virus that causes COVID-19. Institutional protocols and  algorithms that pertain to the evaluation of patients at risk for COVID-19 are in a state of rapid change based on information released by regulatory bodies including the CDC and federal and state organizations. These policies and algorithms were followed during the patient's care.

## 2021-03-27 NOTE — Sepsis Progress Note (Signed)
ELink tracking the Code Sepsis. 

## 2021-03-28 ENCOUNTER — Inpatient Hospital Stay (HOSPITAL_COMMUNITY): Payer: 59

## 2021-03-28 DIAGNOSIS — Z7901 Long term (current) use of anticoagulants: Secondary | ICD-10-CM | POA: Diagnosis not present

## 2021-03-28 DIAGNOSIS — E7800 Pure hypercholesterolemia, unspecified: Secondary | ICD-10-CM | POA: Diagnosis present

## 2021-03-28 DIAGNOSIS — R9431 Abnormal electrocardiogram [ECG] [EKG]: Secondary | ICD-10-CM | POA: Diagnosis present

## 2021-03-28 DIAGNOSIS — A4151 Sepsis due to Escherichia coli [E. coli]: Secondary | ICD-10-CM | POA: Diagnosis present

## 2021-03-28 DIAGNOSIS — D696 Thrombocytopenia, unspecified: Secondary | ICD-10-CM | POA: Diagnosis present

## 2021-03-28 DIAGNOSIS — J690 Pneumonitis due to inhalation of food and vomit: Secondary | ICD-10-CM | POA: Diagnosis present

## 2021-03-28 DIAGNOSIS — E860 Dehydration: Secondary | ICD-10-CM | POA: Diagnosis present

## 2021-03-28 DIAGNOSIS — K219 Gastro-esophageal reflux disease without esophagitis: Secondary | ICD-10-CM | POA: Diagnosis present

## 2021-03-28 DIAGNOSIS — R6520 Severe sepsis without septic shock: Secondary | ICD-10-CM | POA: Diagnosis not present

## 2021-03-28 DIAGNOSIS — N179 Acute kidney failure, unspecified: Secondary | ICD-10-CM | POA: Diagnosis not present

## 2021-03-28 DIAGNOSIS — Z79899 Other long term (current) drug therapy: Secondary | ICD-10-CM | POA: Diagnosis not present

## 2021-03-28 DIAGNOSIS — Z6828 Body mass index (BMI) 28.0-28.9, adult: Secondary | ICD-10-CM | POA: Diagnosis not present

## 2021-03-28 DIAGNOSIS — A419 Sepsis, unspecified organism: Secondary | ICD-10-CM | POA: Diagnosis present

## 2021-03-28 DIAGNOSIS — Z96642 Presence of left artificial hip joint: Secondary | ICD-10-CM | POA: Diagnosis present

## 2021-03-28 DIAGNOSIS — K8020 Calculus of gallbladder without cholecystitis without obstruction: Secondary | ICD-10-CM | POA: Diagnosis not present

## 2021-03-28 DIAGNOSIS — Z20822 Contact with and (suspected) exposure to covid-19: Secondary | ICD-10-CM | POA: Diagnosis present

## 2021-03-28 DIAGNOSIS — R630 Anorexia: Secondary | ICD-10-CM | POA: Diagnosis not present

## 2021-03-28 DIAGNOSIS — K8070 Calculus of gallbladder and bile duct without cholecystitis without obstruction: Secondary | ICD-10-CM | POA: Diagnosis present

## 2021-03-28 DIAGNOSIS — Z8249 Family history of ischemic heart disease and other diseases of the circulatory system: Secondary | ICD-10-CM | POA: Diagnosis not present

## 2021-03-28 DIAGNOSIS — E876 Hypokalemia: Secondary | ICD-10-CM | POA: Diagnosis present

## 2021-03-28 DIAGNOSIS — K5790 Diverticulosis of intestine, part unspecified, without perforation or abscess without bleeding: Secondary | ICD-10-CM | POA: Diagnosis present

## 2021-03-28 DIAGNOSIS — K30 Functional dyspepsia: Secondary | ICD-10-CM | POA: Diagnosis present

## 2021-03-28 DIAGNOSIS — K759 Inflammatory liver disease, unspecified: Secondary | ICD-10-CM | POA: Diagnosis present

## 2021-03-28 DIAGNOSIS — R7989 Other specified abnormal findings of blood chemistry: Secondary | ICD-10-CM | POA: Diagnosis present

## 2021-03-28 LAB — CBC WITH DIFFERENTIAL/PLATELET
Abs Immature Granulocytes: 0.04 10*3/uL (ref 0.00–0.07)
Basophils Absolute: 0 10*3/uL (ref 0.0–0.1)
Basophils Relative: 0 %
Eosinophils Absolute: 0 10*3/uL (ref 0.0–0.5)
Eosinophils Relative: 0 %
HCT: 44.2 % (ref 39.0–52.0)
Hemoglobin: 14.8 g/dL (ref 13.0–17.0)
Immature Granulocytes: 0 %
Lymphocytes Relative: 2 %
Lymphs Abs: 0.2 10*3/uL — ABNORMAL LOW (ref 0.7–4.0)
MCH: 30.9 pg (ref 26.0–34.0)
MCHC: 33.5 g/dL (ref 30.0–36.0)
MCV: 92.3 fL (ref 80.0–100.0)
Monocytes Absolute: 0.8 10*3/uL (ref 0.1–1.0)
Monocytes Relative: 7 %
Neutro Abs: 9.1 10*3/uL — ABNORMAL HIGH (ref 1.7–7.7)
Neutrophils Relative %: 91 %
Platelets: 108 10*3/uL — ABNORMAL LOW (ref 150–400)
RBC: 4.79 MIL/uL (ref 4.22–5.81)
RDW: 13.2 % (ref 11.5–15.5)
WBC: 10.1 10*3/uL (ref 4.0–10.5)
nRBC: 0 % (ref 0.0–0.2)

## 2021-03-28 LAB — RAPID URINE DRUG SCREEN, HOSP PERFORMED
Amphetamines: NOT DETECTED
Barbiturates: NOT DETECTED
Benzodiazepines: NOT DETECTED
Cocaine: NOT DETECTED
Opiates: POSITIVE — AB
Tetrahydrocannabinol: NOT DETECTED

## 2021-03-28 LAB — COMPREHENSIVE METABOLIC PANEL
ALT: 45 U/L — ABNORMAL HIGH (ref 0–44)
ALT: 67 U/L — ABNORMAL HIGH (ref 0–44)
AST: 24 U/L (ref 15–41)
AST: 35 U/L (ref 15–41)
Albumin: 2 g/dL — ABNORMAL LOW (ref 3.5–5.0)
Albumin: 3.2 g/dL — ABNORMAL LOW (ref 3.5–5.0)
Alkaline Phosphatase: 58 U/L (ref 38–126)
Alkaline Phosphatase: 91 U/L (ref 38–126)
Anion gap: 5 (ref 5–15)
Anion gap: 8 (ref 5–15)
BUN: 15 mg/dL (ref 8–23)
BUN: 20 mg/dL (ref 8–23)
CO2: 18 mmol/L — ABNORMAL LOW (ref 22–32)
CO2: 22 mmol/L (ref 22–32)
Calcium: 5.8 mg/dL — CL (ref 8.9–10.3)
Calcium: 8.5 mg/dL — ABNORMAL LOW (ref 8.9–10.3)
Chloride: 118 mmol/L — ABNORMAL HIGH (ref 98–111)
Chloride: 107 mmol/L (ref 98–111)
Creatinine, Ser: 1.03 mg/dL (ref 0.61–1.24)
Creatinine, Ser: 1.41 mg/dL — ABNORMAL HIGH (ref 0.61–1.24)
GFR, Estimated: >60 mL/min (ref >60)
GFR, Estimated: 56 mL/min — ABNORMAL LOW (ref >60)
Glucose, Bld: 87 mg/dL (ref 70–99)
Glucose, Bld: 117 mg/dL — ABNORMAL HIGH (ref 70–99)
Potassium: 3 mmol/L — ABNORMAL LOW (ref 3.5–5.1)
Potassium: 4 mmol/L (ref 3.5–5.1)
Sodium: 141 mmol/L (ref 135–145)
Sodium: 137 mmol/L (ref 135–145)
Total Bilirubin: 3.9 mg/dL — ABNORMAL HIGH (ref 0.3–1.2)
Total Bilirubin: 5.1 mg/dL — ABNORMAL HIGH (ref 0.3–1.2)
Total Protein: 3.9 g/dL — ABNORMAL LOW (ref 6.5–8.1)
Total Protein: 6 g/dL — ABNORMAL LOW (ref 6.5–8.1)

## 2021-03-28 LAB — BLOOD CULTURE ID PANEL (REFLEXED) - BCID2

## 2021-03-28 LAB — PHOSPHORUS: Phosphorus: 2.7 mg/dL (ref 2.5–4.6)

## 2021-03-28 LAB — CBC
HCT: 43.6 % (ref 39.0–52.0)
Hemoglobin: 14.4 g/dL (ref 13.0–17.0)
MCH: 31 pg (ref 26.0–34.0)
MCHC: 33 g/dL (ref 30.0–36.0)
MCV: 93.8 fL (ref 80.0–100.0)
Platelets: 112 10*3/uL — ABNORMAL LOW (ref 150–400)
RBC: 4.65 MIL/uL (ref 4.22–5.81)
RDW: 13.4 % (ref 11.5–15.5)
WBC: 7.8 10*3/uL (ref 4.0–10.5)
nRBC: 0 % (ref 0.0–0.2)

## 2021-03-28 LAB — PROCALCITONIN: Procalcitonin: 34.45 ng/mL

## 2021-03-28 LAB — LACTIC ACID, PLASMA: Lactic Acid, Venous: 1.5 mmol/L (ref 0.5–1.9)

## 2021-03-28 LAB — TSH: TSH: 0.785 u[IU]/mL (ref 0.350–4.500)

## 2021-03-28 LAB — OSMOLALITY: Osmolality: 285 mOsm/kg (ref 275–295)

## 2021-03-28 LAB — STREP PNEUMONIAE URINARY ANTIGEN: Strep Pneumo Urinary Antigen: NEGATIVE

## 2021-03-28 LAB — HIV ANTIBODY (ROUTINE TESTING W REFLEX): HIV Screen 4th Generation wRfx: NONREACTIVE

## 2021-03-28 LAB — TROPONIN I (HIGH SENSITIVITY): Troponin I (High Sensitivity): 18 ng/L — ABNORMAL HIGH (ref <18)

## 2021-03-28 LAB — MAGNESIUM: Magnesium: 1.4 mg/dL — ABNORMAL LOW (ref 1.7–2.4)

## 2021-03-28 IMAGING — NM NM HEPATOBILIARY IMAGE, INC GB
3 series · 18 of 18 positions shown · non-contrast
Comparison: Ultrasound [DATE]

CLINICAL DATA: Elevated bilirubin.

EXAM:
NUCLEAR MEDICINE HEPATOBILIARY IMAGING
TECHNIQUE: Sequential images of the abdomen were obtained [DATE] minutes
following intravenous administration of radiopharmaceutical.
RADIOPHARMACEUTICALS:  7.6 mCi [NO]  Choletec IV

[Series 1: hida scan 30 min · 3.28mm/px · 6 of 30 frames shown]
[frame 3/30]
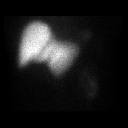
[frame 8/30]
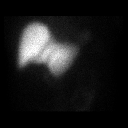
[frame 13/30]
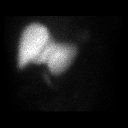
[frame 18/30]
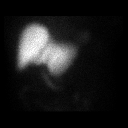
[frame 23/30]
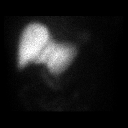
[frame 28/30]
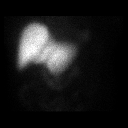

[Series 1: hida scan 1hr · 3.28mm/px · 6 of 60 frames shown]
[frame 6/60]
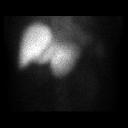
[frame 16/60]
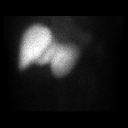
[frame 26/60]
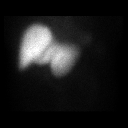
[frame 36/60]
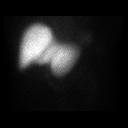
[frame 46/60]
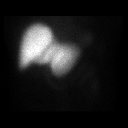
[frame 56/60]
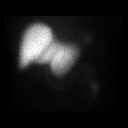

[Series 1: hida scan mso4 · 3.28mm/px · 6 of 30 frames shown]
[frame 3/30]
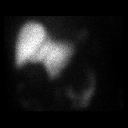
[frame 8/30]
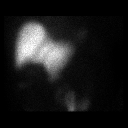
[frame 13/30]
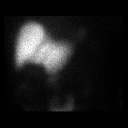
[frame 18/30]
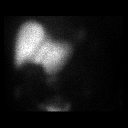
[frame 23/30]
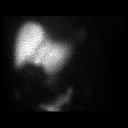
[frame 28/30]
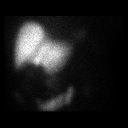

[18 of 18 positions shown; findings below may reference images not displayed]

FINDINGS: Uniform uptake of radiotracer with liver. Delayed clearance of
radiotracer from the blood pool consistent with elevated bilirubin.

There is minimal excretion of radiotracer from the liver. Faint
counts are noted in the common bile duct and proximal small at 60
minutes

IV morphine was administered to augment filling of the gallbladder.
Gallbladder does not fill after morphine administration; however,
there is little radiotracer activity excreted into the bowel. The
majority of radiotracer remains liver after 90 minutes of imaging.
IMPRESSION: 1. Poor excretion of radiotracer from the liver suggest hepatic
dysfunction such as hepatitis.
2. Counts are evident, albeit faintly, within the common bile duct
and bowel consistent with patent common bile duct.
3. Gallbladder fails to fill after morphine augmentation; however,
the sensitivity exam is lessened due to the poor of excretion
radiotracer from the liver

## 2021-03-28 MED ORDER — TECHNETIUM TC 99M MEBROFENIN IV KIT
7.6000 | PACK | Freq: Once | INTRAVENOUS | Status: AC
  Administered 2021-03-28: 10:00:00 7.6 via INTRAVENOUS

## 2021-03-28 MED ORDER — ONDANSETRON HCL 4 MG/2ML IJ SOLN: 4.0000 mg | Freq: Four times a day (QID) | INTRAMUSCULAR | Status: DC | PRN

## 2021-03-28 MED ORDER — DEXTROSE-NACL 5-0.45 % IV SOLN: INTRAVENOUS | Status: AC

## 2021-03-28 MED ORDER — POTASSIUM CHLORIDE 20 MEQ PO PACK
40.0000 meq | PACK | Freq: Two times a day (BID) | ORAL | Status: AC
  Filled 2021-03-28 (×2): qty 2

## 2021-03-28 MED ORDER — MORPHINE SULFATE (PF) 2 MG/ML IV SOLN
2.0000 mg | INTRAVENOUS | Status: DC | PRN
  Administered 2021-03-28: 12:00:00 3 mg via INTRAVENOUS

## 2021-03-28 MED ORDER — PIPERACILLIN-TAZOBACTAM 3.375 G IVPB
3.3750 g | Freq: Three times a day (TID) | INTRAVENOUS | Status: DC
  Administered 2021-03-29: 3.375 g via INTRAVENOUS
  Filled 2021-03-28 (×2): qty 50

## 2021-03-28 MED ORDER — MORPHINE SULFATE (PF) 4 MG/ML IV SOLN: 3.0000 mg | Freq: Once | INTRAVENOUS | Status: DC

## 2021-03-28 MED ORDER — MAGNESIUM SULFATE 2 GM/50ML IV SOLN
2.0000 g | Freq: Once | INTRAVENOUS | Status: AC
  Administered 2021-03-28: 08:00:00 2 g via INTRAVENOUS
  Filled 2021-03-28: qty 50

## 2021-03-28 NOTE — Consult Note (Addendum)
[facility] Surgery Consult Note  [name] February 24, 1956  [id number].    Requesting MD: [name] Chief Complaint/Reason for Consult: epigastric pain  HPI:  Juan Snow is a 66yo male PMH HLD and GERD who presented to [facility] last night complaining of worsening abdominal pain. States that he has taken nexium for a long time for GERD. He stopped this medication last Thursday. On Sunday evening he developed epigastric abdominal pain. It became acutely worse on Monday. Associated with one episode of nausea and vomiting as well as chills. Worse after PO intake. Tried pepcid and Pepto bismol but this did not help. States that he has had 2 similar but less severe episodes earlier this month.  In the ED patient was found to be febrile to 100.8 and tachycardic in the 110s, BP ok. WBC 13.9, lactic acid 2.2>> 1.5 after fluids. LFTs elevated with Tbili 5.4 (direct 3.1, indirect 2.3), lipase WNL. Blood cultures positive for E coli and Enterobacterales. U/s shows gallstones and sludge, no sonographic signs of cholecystitis and negative Murphy sign, no dilation of bile ducts. HIDA scan has been ordered and is pending. Patient was started on IV zosyn and admitted to the medical service. General surgery asked to see.  Abdominal surgical history: none Anticoagulants: none Nonsmoker Denies alcohol or illicit drug use Employment: retired last month  Review of Systems  Constitutional:  Positive for chills and fever.  Respiratory: Negative.    Cardiovascular: Negative.   Gastrointestinal:  Positive for abdominal pain, nausea and vomiting. Negative for constipation and diarrhea.   All systems reviewed and otherwise negative except for as above  Family History  Problem Relation Age of Onset   CAD Brother    Hypertension Other    Diabetes Neg Hx     Past Medical History:  Diagnosis Date   Arthritis    OA   Cough    WHITE SPUTUM OCC, NO FEVER COLD SYMPTOMS MAINLY   GERD  (gastroesophageal reflux disease)     Past Surgical History:  Procedure Laterality Date   COLONOSCOPY  06/17/2011   Procedure: COLONOSCOPY;  Surgeon: [name], MD;  Location: [facility] ENDOSCOPY;  Service: Endoscopy;  Laterality: N/A;   KNEE SURGERY Right YRS AGO   MENISCUS   THUMB ARTHROSCOPY Right YRS AGO   TOTAL HIP ARTHROPLASTY Left 06/25/2017   Procedure: LEFT TOTAL HIP ARTHROPLASTY ANTERIOR APPROACH;  Surgeon: Gaynelle Arabian, MD;  Location: [facility] ORS;  Service: Orthopedics;  Laterality: Left;    Social History:  reports that he has never smoked. He has never used smokeless tobacco. He reports that he does not drink alcohol and does not use drugs.  Allergies: No Known Allergies  (Not in a hospital admission)   Prior to Admission medications   Medication Sig Start Date End Date Taking? Authorizing Provider  acetaminophen (TYLENOL) 500 MG tablet Take 500 mg by mouth every 6 (six) hours as needed (for pain).   Yes [provider]  atorvastatin (LIPITOR) 10 MG tablet Take 1 tablet (10 mg total) by mouth daily. 01/11/21  Yes   Cholecalciferol (VITAMIN D3) 50 MCG (2000 UT) TABS Take 8,000 Units by mouth daily.   Yes [provider]  Coenzyme Q10 (CO Q-10 PO) Take 1 capsule by mouth daily.   Yes [provider]  esomeprazole (NEXIUM) 20 MG capsule Take 20 mg by mouth daily before breakfast.   Yes [provider]  ibuprofen (ADVIL) 200 MG tablet Take 600 mg by mouth every 6 (six)  hours as needed (for pain).   Yes [provider]  Menaquinone-7 (VITAMIN K2 PO) Take 1 tablet by mouth daily.   Yes [provider]  vitamin C (ASCORBIC ACID) 500 MG tablet Take 500-1,000 mg by mouth daily.   Yes [provider]  atorvastatin (LIPITOR) 10 MG tablet TAKE 1 TABLET BY MOUTH ONCE A DAY Patient not taking: Reported on 03/27/2021 12/29/19 04/27/21  Vernie Shanks, MD  atorvastatin (LIPITOR) 10 MG tablet Take 1 tablet by mouth once a day Patient  not taking: Reported on 03/27/2021 07/06/20     atorvastatin (LIPITOR) 10 MG tablet Take 1 tablet (10 mg total) by mouth daily. Patient not taking: Reported on 03/27/2021 10/10/20     HYDROcodone-acetaminophen (NORCO) 7.5-325 MG tablet Take 1 tablet by mouth every 4 (four) hours as needed for severe pain (pain score 7-10). Patient not taking: Reported on 03/27/2021 06/26/17   [place], [employer], PA-C  methocarbamol (ROBAXIN) 500 MG tablet Take 1 tablet (500 mg total) by mouth every 6 (six) hours as needed for muscle spasms. Patient not taking: Reported on 03/27/2021 06/25/17   [place], [employer], PA-C  rivaroxaban (XARELTO) 10 MG TABS tablet Take 1 tablet (10 mg total) by mouth daily with breakfast. Patient not taking: Reported on 03/27/2021 06/26/17   [place], [employer], PA-C  traMADol (ULTRAM) 50 MG tablet Take 1-2 tablets (50-100 mg total) by mouth every 6 (six) hours as needed for moderate pain. Patient not taking: Reported on 03/27/2021 06/26/17   Sheryle Hail, PA-C    Blood pressure 119/89, pulse 70, temperature 98.9 F (37.2 C), temperature source Oral, resp. rate (!) 22, height 5\' 8"  (1.727 m), weight 85.3 kg, SpO2 95 %. Physical Exam: General: pleasant, WD/WN male who is laying in bed in NAD HEENT: head is normocephalic, atraumatic.  Sclera are noninjected.  Pupils equal and round.  Ears and nose without any masses or lesions.  Mouth is pink and moist. Dentition fair Heart: regular, rate, and rhythm.  Normal s1,s2. No obvious murmurs, gallops, or rubs noted.  Palpable pedal pulses bilaterally  Lungs: CTAB, no wheezes, rhonchi, or rales noted.  Respiratory effort nonlabored Abd: soft, mild distension, no masses or organomegaly. Focally tender epigastric region without rebound or guarding, negative Murphy sign MS: no BUE/BLE edema, calves soft and nontender Skin: warm and dry with no masses, lesions, or rashes Psych: A&Ox4 with an appropriate affect Neuro: cranial nerves grossly intact, equal  strength in BUE/BLE bilaterally, normal speech, thought process intact  Results for orders placed or performed during the hospital encounter of 03/27/21 (from the past 48 hour(s))  Culture, blood (routine x 2)     Status: None (Preliminary result)   Collection Time: 03/27/21  4:56 PM   Specimen: Right Antecubital; Blood  Result Value Ref Range   Specimen Description      RIGHT ANTECUBITAL Performed at [facility], [place] [address], [place], [place] [address]    Special Requests      BOTTLES DRAWN AEROBIC AND ANAEROBIC Blood Culture adequate volume Performed at [place] [address] [place], [place] [address]    Culture  Setup Time      GRAM NEGATIVE RODS IN BOTH AEROBIC AND ANAEROBIC BOTTLES Organism ID to follow CRITICAL RESULT CALLED TO, READ BACK BY AND VERIFIED WITH: Sanda Klein, AT 7510 03/28/21 D. VANHOOK Performed at [facility], [place] [address], [place], [place] [address]    Culture GRAM NEGATIVE RODS  Report Status PENDING   Blood Culture ID Panel (Reflexed)     Status: Abnormal   Collection Time: 03/27/21  4:56 PM  Result Value Ref Range   Enterococcus faecalis NOT DETECTED NOT DETECTED   Enterococcus Faecium NOT DETECTED NOT DETECTED   Listeria monocytogenes NOT DETECTED NOT DETECTED   Staphylococcus species NOT DETECTED NOT DETECTED   Staphylococcus aureus (BCID) NOT DETECTED NOT DETECTED   Staphylococcus epidermidis NOT DETECTED NOT DETECTED   Staphylococcus lugdunensis NOT DETECTED NOT DETECTED   Streptococcus species NOT DETECTED NOT DETECTED   Streptococcus agalactiae NOT DETECTED NOT DETECTED   Streptococcus pneumoniae NOT DETECTED NOT DETECTED   Streptococcus pyogenes NOT DETECTED NOT DETECTED   A.calcoaceticus-baumannii NOT DETECTED NOT DETECTED   Bacteroides fragilis NOT DETECTED NOT DETECTED   Enterobacterales DETECTED (A) NOT DETECTED    Comment: Enterobacterales represent a large order of gram  negative bacteria, not a single organism. CRITICAL RESULT CALLED TO, READ BACK BY AND VERIFIED WITH: Sanda Klein, AT 7062 03/28/21 D. VANHOOK    Enterobacter cloacae complex NOT DETECTED NOT DETECTED   Escherichia coli DETECTED (A) NOT DETECTED    Comment: CRITICAL RESULT CALLED TO, READ BACK BY AND VERIFIED WITH: Sanda Klein, AT 3762 03/28/21 D. VANHOOK    Klebsiella aerogenes NOT DETECTED NOT DETECTED   Klebsiella oxytoca NOT DETECTED NOT DETECTED   Klebsiella pneumoniae NOT DETECTED NOT DETECTED   Proteus species NOT DETECTED NOT DETECTED   Salmonella species NOT DETECTED NOT DETECTED   Serratia marcescens NOT DETECTED NOT DETECTED   Haemophilus influenzae NOT DETECTED NOT DETECTED   Neisseria meningitidis NOT DETECTED NOT DETECTED   Pseudomonas aeruginosa NOT DETECTED NOT DETECTED   Stenotrophomonas maltophilia NOT DETECTED NOT DETECTED   Candida albicans NOT DETECTED NOT DETECTED   Candida auris NOT DETECTED NOT DETECTED   Candida glabrata NOT DETECTED NOT DETECTED   Candida krusei NOT DETECTED NOT DETECTED   Candida parapsilosis NOT DETECTED NOT DETECTED   Candida tropicalis NOT DETECTED NOT DETECTED   Cryptococcus neoformans/gattii NOT DETECTED NOT DETECTED   CTX-M ESBL NOT DETECTED NOT DETECTED   Carbapenem resistance IMP NOT DETECTED NOT DETECTED   Carbapenem resistance KPC NOT DETECTED NOT DETECTED   Carbapenem resistance NDM NOT DETECTED NOT DETECTED   Carbapenem resist OXA 48 LIKE NOT DETECTED NOT DETECTED   Carbapenem resistance VIM NOT DETECTED NOT DETECTED    Comment: Performed at [facility], [place] [address] [place], [place] [address]  Lipase, blood     Status: None   Collection Time: 03/27/21  4:58 PM  Result Value Ref Range   Lipase 24 11 - 51 U/L    Comment: Performed at [facility], [place] [address], [place], [place] [address]  Comprehensive metabolic panel     Status: Abnormal   Collection Time: 03/27/21  4:58 PM  Result Value  Ref Range   Sodium 133 (L) 135 - 145 mmol/L   Potassium 3.9 3.5 - 5.1 mmol/L   Chloride 101 98 - 111 mmol/L   CO2 20 (L) 22 - 32 mmol/L   Glucose, Bld 155 (H) 70 - 99 mg/dL    Comment: Glucose reference range applies only to samples taken after fasting for at least 8 hours.   BUN 20 8 - 23 mg/dL   Creatinine, Ser 1.40 (H) 0.61 - 1.24 mg/dL   Calcium 9.2 8.9 - 10.3 mg/dL   Total Protein 7.1 6.5 - 8.1 g/dL   Albumin 4.0 3.5 -  5.0 g/dL   AST 39 15 - 41 U/L   ALT 77 (H) 0 - 44 U/L   Alkaline Phosphatase 103 38 - 126 U/L   Total Bilirubin 4.6 (H) 0.3 - 1.2 mg/dL   GFR, Estimated 56 (L) >60 mL/min    Comment: (NOTE) Calculated using the CKD-EPI Creatinine Equation (2021)    Anion gap 12 5 - 15    Comment: Performed at [facility], [place] [address] [place], [place] [address]  Type and screen [place]     Status: None   Collection Time: 03/27/21  4:58 PM  Result Value Ref Range   ABO/RH(D) B POS    Antibody Screen NEG    Sample Expiration      03/30/2021,2359 Performed at [facility], [place] [address], [place], [place] [address]   CBC with Differential     Status: Abnormal   Collection Time: 03/27/21  4:58 PM  Result Value Ref Range   WBC 13.9 (H) 4.0 - 10.5 K/uL   RBC 5.53 4.22 - 5.81 MIL/uL   Hemoglobin 17.2 (H) 13.0 - 17.0 g/dL   HCT 49.8 39.0 - 52.0 %   MCV 90.1 80.0 - 100.0 fL   MCH 31.1 26.0 - 34.0 pg   MCHC 34.5 30.0 - 36.0 g/dL   RDW 13.1 11.5 - 15.5 %   Platelets 117 (L) 150 - 400 K/uL    Comment: SPECIMEN CHECKED FOR CLOTS Immature Platelet Fraction may be clinically indicated, consider ordering this additional test [id number] REPEATED TO VERIFY PLATELET COUNT CONFIRMED BY SMEAR    nRBC 0.0 0.0 - 0.2 %   Neutrophils Relative % 97 %   Neutro Abs 13.4 (H) 1.7 - 7.7 K/uL   Lymphocytes Relative 1 %   Lymphs Abs 0.1 (L) 0.7 - 4.0 K/uL   Monocytes Relative 2 %   Monocytes Absolute 0.3 0.1 - 1.0 K/uL    Eosinophils Relative 0 %   Eosinophils Absolute 0.0 0.0 - 0.5 K/uL   Basophils Relative 0 %   Basophils Absolute 0.0 0.0 - 0.1 K/uL   Immature Granulocytes 0 %   Abs Immature Granulocytes 0.06 0.00 - 0.07 K/uL    Comment: Performed at [facility], [place] [address], [place], [place] [address]  Lactic acid, plasma     Status: Abnormal   Collection Time: 03/27/21  4:58 PM  Result Value Ref Range   Lactic Acid, Venous 2.2 (HH) 0.5 - 1.9 mmol/L    Comment: CRITICAL RESULT CALLED TO, READ BACK BY AND VERIFIED WITH: BRATU,D. EMTP AT 1758 03/27/21 MULLINS,T Performed at [facility], [place] [address] [place], [place] [address]   Magnesium     Status: None   Collection Time: 03/27/21  4:58 PM  Result Value Ref Range   Magnesium 1.7 1.7 - 2.4 mg/dL    Comment: Performed at [facility], [place] [address] [place], [place] [address]  Culture, blood (routine x 2)     Status: None (Preliminary result)   Collection Time: 03/27/21  5:15 PM   Specimen: BLOOD  Result Value Ref Range   Specimen Description      BLOOD LEFT ANTECUBITAL Performed at [facility], [place] [address], [place], [place] [address]    Special Requests      BOTTLES DRAWN AEROBIC AND ANAEROBIC Blood Culture adequate volume Performed at [place] [address] [place], [place] [address]  Culture  Setup Time      GRAM NEGATIVE RODS IN BOTH AEROBIC AND ANAEROBIC BOTTLES CRITICAL VALUE NOTED.  VALUE IS CONSISTENT WITH PREVIOUSLY REPORTED AND CALLED VALUE. Performed at [facility], [place] [address] [place], [place] [address]    Culture GRAM NEGATIVE RODS    Report Status PENDING   Resp Panel by RT-PCR (Flu A&B, Covid) Nasopharyngeal Swab     Status: None   Collection Time: 03/27/21  5:58 PM   Specimen: Nasopharyngeal Swab; Nasopharyngeal(NP) swabs in vial transport medium  Result Value Ref Range   SARS Coronavirus 2 by RT  PCR NEGATIVE NEGATIVE    Comment: (NOTE) SARS-CoV-2 target nucleic acids are NOT DETECTED.  The SARS-CoV-2 RNA is generally detectable in upper respiratory specimens during the acute phase of infection. The lowest concentration of SARS-CoV-2 viral copies this assay can detect is 138 copies/mL. A negative result does not preclude SARS-Cov-2 infection and should not be used as the sole basis for treatment or other patient management decisions. A negative result may occur with  improper specimen collection/handling, submission of specimen other than nasopharyngeal swab, presence of viral mutation(s) within the areas targeted by this assay, and inadequate number of viral copies(<138 copies/mL). A negative result must be combined with clinical observations, patient history, and epidemiological information. The expected result is Negative.  Fact Sheet for Patients:  EntrepreneurPulse.com.au  Fact Sheet for Healthcare Providers:  IncredibleEmployment.be  This test is no t yet approved or cleared by the [place] FDA and  has been authorized for detection and/or diagnosis of SARS-CoV-2 by FDA under an Emergency Use Authorization (EUA). This EUA will remain  in effect (meaning this test can be used) for the duration of the COVID-19 declaration under Section 564(b)(1) of the Act, 21 U.S.C.section 360bbb-3(b)(1), unless the authorization is terminated  or revoked sooner.       Influenza A by PCR NEGATIVE NEGATIVE   Influenza B by PCR NEGATIVE NEGATIVE    Comment: (NOTE) The Xpert Xpress SARS-CoV-2/FLU/RSV plus assay is intended as an aid in the diagnosis of influenza from Nasopharyngeal swab specimens and should not be used as a sole basis for treatment. Nasal washings and aspirates are unacceptable for Xpert Xpress SARS-CoV-2/FLU/RSV testing.  Fact Sheet for Patients: EntrepreneurPulse.com.au  Fact Sheet for Healthcare  Providers: IncredibleEmployment.be  This test is not yet approved or cleared by the [place] FDA and has been authorized for detection and/or diagnosis of SARS-CoV-2 by FDA under an Emergency Use Authorization (EUA). This EUA will remain in effect (meaning this test can be used) for the duration of the COVID-19 declaration under Section 564(b)(1) of the Act, 21 U.S.C. section 360bbb-3(b)(1), unless the authorization is terminated or revoked.  Performed at [facility], [place] [address] [place], [place] [address]   Lactic acid, plasma     Status: None   Collection Time: 03/27/21  8:27 PM  Result Value Ref Range   Lactic Acid, Venous 1.6 0.5 - 1.9 mmol/L    Comment: Performed at [facility], [place] [address] [place], [place] [address]  Urinalysis, Routine w reflex microscopic Urine, Clean Catch     Status: Abnormal   Collection Time: 03/27/21  8:37 PM  Result Value Ref Range   Color, Urine AMBER (A) YELLOW    Comment: BIOCHEMICALS MAY BE AFFECTED BY COLOR   APPearance HAZY (A) CLEAR   Specific Gravity, Urine 1.029 1.005 - 1.030   pH 5.0 5.0 - 8.0  Glucose, UA NEGATIVE NEGATIVE mg/dL   Hgb urine dipstick NEGATIVE NEGATIVE   Bilirubin Urine SMALL (A) NEGATIVE   Ketones, ur 20 (A) NEGATIVE mg/dL   Protein, ur 100 (A) NEGATIVE mg/dL   Nitrite NEGATIVE NEGATIVE   Leukocytes,Ua NEGATIVE NEGATIVE   RBC / HPF 0-5 0 - 5 RBC/hpf   WBC, UA 6-10 0 - 5 WBC/hpf   Bacteria, UA NONE SEEN NONE SEEN   Mucus PRESENT     Comment: Performed at [place] [address], [place], [place] [address]  Osmolality, urine     Status: None   Collection Time: 03/27/21  8:38 PM  Result Value Ref Range   Osmolality, Ur 801 300 - 900 mOsm/kg    Comment: Performed at [place] [address], [place], [place] [address]  Sodium, urine, random     Status: None   Collection Time: 03/27/21  8:38 PM  Result Value Ref  Range   Sodium, Ur 35 mmol/L    Comment: Performed at [facility], [place] [address] [place], [place] [address]  Creatinine, urine, random     Status: None   Collection Time: 03/27/21  8:38 PM  Result Value Ref Range   Creatinine, Urine 424.62 mg/dL    Comment: RESULTS CONFIRMED BY MANUAL DILUTION Performed at [place] [address] [place], [place] [address]   Troponin I (High Sensitivity)     Status: None   Collection Time: 03/27/21 11:11 PM  Result Value Ref Range   Troponin I (High Sensitivity) 16 <18 ng/L    Comment: (NOTE) Elevated high sensitivity troponin I (hsTnI) values and significant  changes across serial measurements may suggest ACS but many other  chronic and acute conditions are known to elevate hsTnI results.  Refer to the "Links" section for chest pain algorithms and additional  guidance. Performed at [facility], [place] [address], [place], [place] [address]   Acetaminophen level     Status: Abnormal   Collection Time: 03/27/21 11:11 PM  Result Value Ref Range   Acetaminophen (Tylenol), Serum <10 (L) 10 - 30 ug/mL    Comment: (NOTE) Therapeutic concentrations vary significantly. A range of 10-30 ug/mL  may be an effective concentration for many patients. However, some  are best treated at concentrations outside of this range. Acetaminophen concentrations >150 ug/mL at 4 hours after ingestion  and >50 ug/mL at 12 hours after ingestion are often associated with  toxic reactions.  Performed at [facility], [place] [address] [place], [place] [address]   Blood gas, venous     Status: Abnormal   Collection Time: 03/27/21 11:11 PM  Result Value Ref Range   pH, Ven 7.429 7.250 - 7.430   pCO2, Ven 38.6 (L) 44.0 - 60.0 mmHg   pO2, Ven 69.1 (H) 32.0 - 45.0 mmHg   Bicarbonate 25.1 20.0 - 28.0 mmol/L   Acid-Base Excess 1.3 0.0 - 2.0 mmol/L   O2 Saturation 92.7 %   Patient temperature  98.6     Comment: Performed at [facility], [place] [address], [place], [place] [address]  Salicylate level     Status: Abnormal   Collection Time: 03/27/21 11:11 PM  Result Value Ref Range   Salicylate Lvl <9.1 (L) 7.0 - 30.0 mg/dL    Comment: Performed at [facility], [place] [address] [place],  [address]  D-dimer, quantitative     Status: Abnormal  Collection Time: 03/27/21 11:11 PM  Result Value Ref Range   D-Dimer, Quant 1.05 (H) 0.00 - 0.50 ug/mL-FEU    Comment: (NOTE) At the manufacturer cut-off value of 0.5 g/mL FEU, this assay has a negative predictive value of 95-100%.This assay is intended for use in conjunction with a clinical pretest probability (PTP) assessment model to exclude pulmonary embolism (PE) and deep venous thrombosis (DVT) in outpatients suspected of PE or DVT. Results should be correlated with clinical presentation. Performed at [facility], [place] [address], [place], [place] [address]   Lactic acid, plasma     Status: None   Collection Time: 03/27/21 11:11 PM  Result Value Ref Range   Lactic Acid, Venous 1.6 0.5 - 1.9 mmol/L    Comment: Performed at [facility], [place] [address] [place], [place] [address]  Procalcitonin     Status: None   Collection Time: 03/27/21 11:11 PM  Result Value Ref Range   Procalcitonin 34.45 ng/mL    Comment:        Interpretation: PCT >= 10 ng/mL: Important systemic inflammatory response, almost exclusively due to severe bacterial sepsis or septic shock. (NOTE)       Sepsis PCT Algorithm           Lower Respiratory Tract                                      Infection PCT Algorithm    ----------------------------     ----------------------------         PCT < 0.25 ng/mL                PCT < 0.10 ng/mL          Strongly encourage             Strongly discourage   discontinuation of antibiotics    initiation of antibiotics     ----------------------------     -----------------------------       PCT 0.25 - 0.50 ng/mL            PCT 0.10 - 0.25 ng/mL               OR       >80% decrease in PCT            Discourage initiation of                                            antibiotics      Encourage discontinuation           of antibiotics    ----------------------------     -----------------------------         PCT >= 0.50 ng/mL              PCT 0.26 - 0.50 ng/mL                AND       <80% decrease in PCT             Encourage initiation of  antibiotics       Encourage continuation           of antibiotics    ----------------------------     -----------------------------        PCT >= 0.50 ng/mL                  PCT > 0.50 ng/mL               AND         increase in PCT                  Strongly encourage                                      initiation of antibiotics    Strongly encourage escalation           of antibiotics                                     -----------------------------                                           PCT <= 0.25 ng/mL                                                 OR                                        > 80% decrease in PCT                                      Discontinue / Do not initiate                                             antibiotics  Performed at [place] [address] [place], [place] [address]   Bilirubin, fractionated(tot/dir/indir)     Status: Abnormal   Collection Time: 03/27/21 11:11 PM  Result Value Ref Range   Total Bilirubin 5.4 (H) 0.3 - 1.2 mg/dL   Bilirubin, Direct 3.1 (H) 0.0 - 0.2 mg/dL   Indirect Bilirubin 2.3 (H) 0.3 - 0.9 mg/dL    Comment: Performed at [facility], [place] [address] [place], [place] [address]  CK     Status: None   Collection Time: 03/27/21 11:11 PM  Result Value Ref Range   Total CK 93 49 - 397 U/L    Comment: Performed at [facility], [place] [address], [place],  [address]  Phosphorus     Status: None   Collection Time: 03/27/21 11:11 PM  Result Value Ref Range   Phosphorus 4.2 2.5 - 4.6 mg/dL    Comment: Performed at [facility], [place] [address]  [place], [place] [address]  TSH     Status: None   Collection Time: 03/27/21 11:11 PM  Result Value Ref Range   TSH 0.785 0.350 - 4.500 uIU/mL    Comment: Performed by a 3rd Generation assay with a functional sensitivity of <=0.01 uIU/mL. Performed at [facility], [place] [address], [place], [place] [address]   Urine rapid drug screen (hosp performed)     Status: Abnormal   Collection Time: 03/28/21 12:15 AM  Result Value Ref Range   Opiates POSITIVE (A) NONE DETECTED   Cocaine NONE DETECTED NONE DETECTED   Benzodiazepines NONE DETECTED NONE DETECTED   Amphetamines NONE DETECTED NONE DETECTED   Tetrahydrocannabinol NONE DETECTED NONE DETECTED   Barbiturates NONE DETECTED NONE DETECTED    Comment: (NOTE) DRUG SCREEN FOR MEDICAL PURPOSES ONLY.  IF CONFIRMATION IS NEEDED FOR ANY PURPOSE, NOTIFY LAB WITHIN 5 DAYS.  LOWEST DETECTABLE LIMITS FOR URINE DRUG SCREEN Drug Class                     Cutoff (ng/mL) Amphetamine and metabolites    1000 Barbiturate and metabolites    200 Benzodiazepine                 341 Tricyclics and metabolites     300 Opiates and metabolites        300 Cocaine and metabolites        300 THC                            50 Performed at [facility], [place] [address] [place], [place] [address]   Lactic acid, plasma     Status: None   Collection Time: 03/28/21 12:15 AM  Result Value Ref Range   Lactic Acid, Venous 1.5 0.5 - 1.9 mmol/L    Comment: Performed at [facility] [place] [address] [place], [place] [address]  Troponin I (High Sensitivity)     Status: Abnormal   Collection Time: 03/28/21 12:15 AM  Result Value Ref Range   Troponin  I (High Sensitivity) 18 (H) <18 ng/L    Comment: (NOTE) Elevated high sensitivity troponin I (hsTnI) values and significant  changes across serial measurements may suggest ACS but many other  chronic and acute conditions are known to elevate hsTnI results.  Refer to the "Links" section for chest pain algorithms and additional  guidance. Performed at [facility], [place] [address] [place], [place] [address]   Strep pneumoniae urinary antigen     Status: None   Collection Time: 03/28/21 12:15 AM  Result Value Ref Range   Strep Pneumo Urinary Antigen NEGATIVE NEGATIVE    Comment:        Infection due to S. pneumoniae cannot be absolutely ruled out since the antigen present may be below the detection limit of the test. Performed at [facility], [address], [place], [place] [address]   HIV Antibody (routine testing w rflx)     Status: None   Collection Time: 03/28/21  4:05 AM  Result Value Ref Range   HIV Screen 4th Generation wRfx Non Reactive Non Reactive    Comment: Performed at [facility], [place] [address], [place], [place] [address]  Magnesium     Status: Abnormal   Collection Time: 03/28/21  4:05 AM  Result Value Ref Range   Magnesium 1.4 (L) 1.7 - 2.4 mg/dL  Comment: Performed at [facility], [place] [address] [place], [place] [address]  Phosphorus     Status: None   Collection Time: 03/28/21  4:05 AM  Result Value Ref Range   Phosphorus 2.7 2.5 - 4.6 mg/dL    Comment: Performed at [facility], [place] [address] [place], [place] [address]  CBC WITH DIFFERENTIAL     Status: Abnormal   Collection Time: 03/28/21  4:05 AM  Result Value Ref Range   WBC 10.1 4.0 - 10.5 K/uL   RBC 4.79 4.22 - 5.81 MIL/uL   Hemoglobin 14.8 13.0 - 17.0 g/dL   HCT 44.2 39.0 - 52.0 %   MCV 92.3 80.0 - 100.0 fL   MCH 30.9 26.0 - 34.0 pg   MCHC 33.5 30.0 - 36.0 g/dL   RDW 13.2 11.5 - 15.5 %   Platelets 108 (L) 150 - 400 K/uL     Comment: SPECIMEN CHECKED FOR CLOTS Immature Platelet Fraction may be clinically indicated, consider ordering this additional test [id number] CONSISTENT WITH PREVIOUS RESULT REPEATED TO VERIFY    nRBC 0.0 0.0 - 0.2 %   Neutrophils Relative % 91 %   Neutro Abs 9.1 (H) 1.7 - 7.7 K/uL   Lymphocytes Relative 2 %   Lymphs Abs 0.2 (L) 0.7 - 4.0 K/uL   Monocytes Relative 7 %   Monocytes Absolute 0.8 0.1 - 1.0 K/uL   Eosinophils Relative 0 %   Eosinophils Absolute 0.0 0.0 - 0.5 K/uL   Basophils Relative 0 %   Basophils Absolute 0.0 0.0 - 0.1 K/uL   Immature Granulocytes 0 %   Abs Immature Granulocytes 0.04 0.00 - 0.07 K/uL    Comment: Performed at [facility], [place] [address] [place], [place] [address]  Comprehensive metabolic panel     Status: Abnormal   Collection Time: 03/28/21  4:05 AM  Result Value Ref Range   Sodium 141 135 - 145 mmol/L    Comment: DELTA CHECK NOTED   Potassium 3.0 (L) 3.5 - 5.1 mmol/L    Comment: DELTA CHECK NOTED   [place] 118 (H) 98 - 111 mmol/L   CO2 18 (L) 22 - 32 mmol/L   Glucose, Bld 87 70 - 99 mg/dL    Comment: Glucose reference range applies only to samples taken after fasting for at least 8 hours.   BUN 15 8 - 23 mg/dL   Creatinine, Ser 1.03 0.61 - 1.24 mg/dL   Calcium 5.8 (LL) 8.9 - 10.3 mg/dL    Comment: CRITICAL RESULT CALLED TO, READ BACK BY AND VERIFIED WITH:  FELICIA GREEN RN 2/54/27 @ 0449 VS    Total Protein 3.9 (L) 6.5 - 8.1 g/dL   Albumin 2.0 (L) 3.5 - 5.0 g/dL   AST 24 15 - 41 U/L   ALT 45 (H) 0 - 44 U/L   Alkaline Phosphatase 58 38 - 126 U/L   Total Bilirubin 3.9 (H) 0.3 - 1.2 mg/dL   GFR, Estimated >60 >60 mL/min    Comment: (NOTE) Calculated using the CKD-EPI Creatinine Equation (2021)    Anion gap 5 5 - 15    Comment: Performed at [facility], [place] [address], [place], [place] [address]  Osmolality     Status: None   Collection Time: 03/28/21  4:05 AM  Result Value Ref Range    Osmolality 285 275 - 295 mOsm/kg    Comment: Performed at [facility], [place] [address] [place], [place]  [address]   DG Chest Port 1 View  Result Date: 03/27/2021 CLINICAL DATA:  Epigastric pain, vomiting EXAM: PORTABLE CHEST 1 VIEW COMPARISON:  None. FINDINGS: Cardiac size is within normal limits. There are no signs of pulmonary edema. There are linear densities in the right lower lung fields. There is no pleural effusion or pneumothorax. IMPRESSION: Linear densities in right lower lung fields suggest scarring or subsegmental atelectasis/pneumonia. Electronically Signed   By: Elmer Picker M.D.   On: 03/27/2021 17:43   US Abdomen Limited RUQ (LIVER/GB)  Result Date: 03/27/2021 CLINICAL DATA:  Abdominal pain EXAM: ULTRASOUND ABDOMEN LIMITED RIGHT UPPER QUADRANT COMPARISON:  None. FINDINGS: Gallbladder: There is 2 cm echogenic focus with acoustic shadowing in the neck of the gallbladder. Sludge is seen in the lumen of the gallbladder. Technologist did not observe any tenderness over the gallbladder. There is no fluid around the gallbladder. Common bile duct: Diameter: 2.3 mm Liver: No focal lesion identified. Within normal limits in parenchymal echogenicity. Portal vein is patent on color Doppler imaging with normal direction of blood flow towards the liver. Other: None. IMPRESSION: There is 2 cm hyperechoic focus with acoustic shadowing in the neck of the gallbladder suggesting gallbladder stone. Sludge is seen in the lumen of the gallbladder. There are no sonographic signs of acute cholecystitis. There is no dilation of bile ducts. Electronically Signed   By: Elmer Picker M.D.   On: 03/27/2021 18:05    Anti-infectives (From admission, onward)    Start     Dose/Rate Route Frequency Ordered Stop   03/28/21 0200  piperacillin-tazobactam (ZOSYN) IVPB 3.375 g        3.375 g 12.5 mL/hr over 240 Minutes Intravenous Every 8 hours 03/27/21 2050     03/27/21 1815  piperacillin-tazobactam  (ZOSYN) IVPB 3.375 g        3.375 g 100 mL/hr over 30 Minutes Intravenous  Once 03/27/21 1806 03/27/21 1843        Assessment/Plan Epigastric abdominal pain Gallstones Hyperbilirubinemia  Bacteremia - E coli and Enterobacterales  - Patient with 3 days of epigastric pain, nausea, vomiting, and chills. U/s shows stones and no signs of acute cholecystitis, but he has a leukocytosis and persistent tenderness on abdominal exam concerning for acute cholecystitis. His LFTs are elevated with Tbili 5.4 - recommend GI consult for further workup, may need ERCP vs MRCP. Agree with IV antibiotics. Keep NPO for now. HIDA scan is pending.   ID - zosyn 1/24>> VTE - SCDs FEN - IVF, NPO Foley - none  HLD GERD  Moderate Medical Decision Making  Wellington Hampshire, PA-C [facility] 03/28/2021, 10:14 AM Please see Amion for pager number during day hours 7:00am-4:30pm

## 2021-03-28 NOTE — Assessment & Plan Note (Signed)
More of epigastric pain.  Worse with eating suggestive of heartburn troponin unremarkable.  Noted elevated D-dimer.  At this point given AKI will hold off on CTA for tonight as symptoms consistent with PE. Will check Dopplers If tomorrow renal function improves and there is still suspicion of possibility of PE would obtain CTA

## 2021-03-28 NOTE — Assessment & Plan Note (Signed)
-  SIRS criteria met with  elevated white blood cell count,       Component Value Date/Time   WBC 13.9 (H) 03/27/2021 1658   LYMPHSABS 0.1 (L) 03/27/2021 1658    fever RR >20  Today's Vitals   03/27/21 1958 03/27/21 2039 03/27/21 2057 03/27/21 2300  BP:    116/80  Pulse:    77  Resp:    (!) 24  Temp:    98.9 F (37.2 C)  TempSrc:    Oral  SpO2:    96%  Weight:  85.3 kg 85.3 kg   Height:   _0  (1.727 m)   PainSc: 5        The recent clinical data is shown below. Vitals:   03/27/21 1955 03/27/21 2039 03/27/21 2057 03/27/21 2300  BP: 115/71   116/80  Pulse: 82   77  Resp: (!) 26   (!) 24  Temp:    98.9 F (37.2 C)  TempSrc:    Oral  SpO2: 92%   96%  Weight:  85.3 kg 85.3 kg   Height:   _1  (1.727 m)      -Most likely source being: , pulmonary vs intra-abdominal,        - Obtain serial lactic acid and procalcitonin level.  - Initiated IV antibiotics in ER: Antibiotics Given (last 72 hours)    Date/Time Action Medication Dose Rate   03/27/21 1809 New Bag/Given   piperacillin-tazobactam (ZOSYN) IVPB 3.375 g 3.375 g 100 mL/hr     Will continue      - await results of blood and urine culture  - Rehydrate aggressively  Intravenous fluids were administered      11:58 PM

## 2021-03-28 NOTE — Progress Notes (Signed)
[facility] PROGRESS NOTE    Progress Note  Juan Snow  QPY:[id number] DOB: May 12, 1955 DOA: 03/27/2021 PCP: Vernie Shanks, MD     Brief Narrative:   Juan Snow is an 66 y.o. male past medical history significant for hyperlipidemia and GERD comes in with abdominal pain fever and chills admitted for sepsis started empirically on antibiotics   Significant studies: Abdominal ultrasound showed probable stone in the neck of the gallbladder  Antibiotics: None  Microbiology data: Blood culture:  Procedures: None  Assessment/Plan:   Severe sepsis [facility]): Has been fluid resuscitated, with a temperature of 100.8 Was given a single dose of IV Zosyn on 03/27/2020. Abdominal ultrasound showed 2 cm hypoechoic focus in the gallbladder neck. Blood cultures and urine cultures have been sent.  LFTs are mildly elevated, but his bilirubin is 3. Check a HIDA scan. Will allow clear liquid diet. Discontinue Doppler studies.  AKI (acute kidney injury) ([place]) Admission 1.4, prerenal in the setting of decreased oral intake has been fluid resuscitated creatinine has returned to baseline 1.0  Hypokalemia/hypomagnesemia Probably contributing to prolonged QTC, replete orally recheck in the morning.  Prolonged QT interval Likely due to electrolyte imbalance replete.  Gall bladder stones LFTs elevated including bilirubin alkaline phosphatase within normal. Check a HIDA scan.  Aspiration pneumonia ([place]) Unlikely, he denies any cough or shortness of breath, chest x-ray showed scarring.   DVT prophylaxis: lovenox Family Communication:wife Status is: Observation  The patient will require care spanning > 2 midnights and should be moved to inpatient because: Severe sepsis of unclear source       Code Status:     Code Status Orders  (From admission, onward)           Start     Ordered   03/27/21 2129  Full code  Continuous        03/27/21 2128           Code  Status History     Date Active Date Inactive Code Status Order ID Comments User Context   06/25/2017 1649 06/26/2017 1820 Full Code [id number]  Gaynelle Arabian, MD Inpatient         IV Access:   Peripheral IV   Procedures and diagnostic studies:   DG Chest Port 1 View  Result Date: 03/27/2021 CLINICAL DATA:  Epigastric pain, vomiting EXAM: PORTABLE CHEST 1 VIEW COMPARISON:  None. FINDINGS: Cardiac size is within normal limits. There are no signs of pulmonary edema. There are linear densities in the right lower lung fields. There is no pleural effusion or pneumothorax. IMPRESSION: Linear densities in right lower lung fields suggest scarring or subsegmental atelectasis/pneumonia. Electronically Signed   By: Elmer Picker M.D.   On: 03/27/2021 17:43   US Abdomen Limited RUQ (LIVER/GB)  Result Date: 03/27/2021 CLINICAL DATA:  Abdominal pain EXAM: ULTRASOUND ABDOMEN LIMITED RIGHT UPPER QUADRANT COMPARISON:  None. FINDINGS: Gallbladder: There is 2 cm echogenic focus with acoustic shadowing in the neck of the gallbladder. Sludge is seen in the lumen of the gallbladder. Technologist did not observe any tenderness over the gallbladder. There is no fluid around the gallbladder. Common bile duct: Diameter: 2.3 mm Liver: No focal lesion identified. Within normal limits in parenchymal echogenicity. Portal vein is patent on color Doppler imaging with normal direction of blood flow towards the liver. Other: None. IMPRESSION: There is 2 cm hyperechoic focus with acoustic shadowing in the neck of the gallbladder suggesting gallbladder stone. Sludge is seen in the lumen of the  gallbladder. There are no sonographic signs of acute cholecystitis. There is no dilation of bile ducts. Electronically Signed   By: Elmer Picker M.D.   On: 03/27/2021 18:05     Medical Consultants:   None.   Subjective:    Juan Snow relates continues to be nauseated abdominal pain has improved  anorexic  Objective:    Vitals:   03/28/21 0400 03/28/21 0500 03/28/21 0600 03/28/21 0700  BP: 113/84 115/77 119/75 117/71  Pulse: 77 68 68 73  Resp: 15 19 (!) 22 (!) 28  Temp:      TempSrc:      SpO2: 95% 94% 92% 93%  Weight:      Height:       SpO2: 93 %   Intake/Output Summary (Last 24 hours) at 03/28/2021 0708 Last data filed at 03/27/2021 2057 Gross per 24 hour  Intake 1500 ml  Output --  Net 1500 ml   Filed Weights   03/27/21 2039 03/27/21 2057  Weight: 85.3 kg 85.3 kg    Exam: General exam: In no acute distress. Respiratory system: Good air movement and clear to auscultation. Cardiovascular system: S1 & S2 heard, RRR. No JVD. Gastrointestinal system: Abdomen is nondistended, soft and nontender.  Extremities: No pedal edema. Skin: No rashes, lesions or ulcers Psychiatry: Judgement and insight appear normal. Mood & affect appropriate.    Data Reviewed:    Labs: Basic Metabolic Panel: Recent Labs  Lab 03/27/21 1658 03/27/21 2311 03/28/21 0405  NA 133*  --  141  K 3.9  --  3.0*  CL 101  --  118*  CO2 20*  --  18*  GLUCOSE 155*  --  87  BUN 20  --  15  CREATININE 1.40*  --  1.03  CALCIUM 9.2  --  5.8*  MG 1.7  --  1.4*  PHOS  --  4.2 2.7   GFR Estimated Creatinine Clearance: 77.1 mL/min (by C-G formula based on SCr of 1.03 mg/dL). Liver Function Tests: Recent Labs  Lab 03/27/21 1658 03/27/21 2311 03/28/21 0405  AST 39  --  24  ALT 77*  --  45*  ALKPHOS 103  --  58  BILITOT 4.6* 5.4* 3.9*  PROT 7.1  --  3.9*  ALBUMIN 4.0  --  2.0*   Recent Labs  Lab 03/27/21 1658  LIPASE 24   No results for input(s): AMMONIA in the last 168 hours. Coagulation profile No results for input(s): INR, PROTIME in the last 168 hours. COVID-19 Labs  Recent Labs    03/27/21 2311  DDIMER 1.05*    Lab Results  Component Value Date   SARSCOV2NAA NEGATIVE 03/27/2021    CBC: Recent Labs  Lab 03/27/21 1658 03/28/21 0405  WBC 13.9* 10.1  NEUTROABS  13.4* 9.1*  HGB 17.2* 14.8  HCT 49.8 44.2  MCV 90.1 92.3  PLT 117* 108*   Cardiac Enzymes: Recent Labs  Lab 03/27/21 2311  CKTOTAL 93   BNP (last 3 results) No results for input(s): PROBNP in the last 8760 hours. CBG: No results for input(s): GLUCAP in the last 168 hours. D-Dimer: Recent Labs    03/27/21 2311  DDIMER 1.05*   Hgb A1c: No results for input(s): HGBA1C in the last 72 hours. Lipid Profile: No results for input(s): CHOL, HDL, LDLCALC, TRIG, CHOLHDL, LDLDIRECT in the last 72 hours. Thyroid function studies: Recent Labs    03/27/21 2311  TSH 0.785   Anemia work up: No results for input(s):  VITAMINB12, FOLATE, FERRITIN, TIBC, IRON, RETICCTPCT in the last 72 hours. Sepsis Labs: Recent Labs  Lab 03/27/21 1658 03/27/21 2027 03/27/21 2311 03/28/21 0015 03/28/21 0405  PROCALCITON  --   --  34.45  --   --   WBC 13.9*  --   --   --  10.1  LATICACIDVEN 2.2* 1.6 1.6 1.5  --    Microbiology Recent Results (from the past 240 hour(s))  Culture, blood (routine x 2)     Status: None (Preliminary result)   Collection Time: 03/27/21  4:56 PM   Specimen: Right Antecubital; Blood  Result Value Ref Range Status   Specimen Description   Final    RIGHT ANTECUBITAL Performed at [facility], [place] [address], [place], [place] [address]    Special Requests   Final    BOTTLES DRAWN AEROBIC AND ANAEROBIC Blood Culture adequate volume Performed at [facility] [address], [place], [place] [address]    Culture   Final    NO GROWTH < 12 HOURS Performed at [facility] [address], [place], [place] [address]    Report Status PENDING  Incomplete  Culture, blood (routine x 2)     Status: None (Preliminary result)   Collection Time: 03/27/21  5:15 PM   Specimen: BLOOD  Result Value Ref Range Status   Specimen Description   Final    BLOOD LEFT ANTECUBITAL Performed at [place] [address], [place], [place] [address]    Special Requests   Final    BOTTLES DRAWN AEROBIC AND ANAEROBIC Blood Culture adequate volume Performed at [facility] [address], [place], [place] [address]    Culture   Final    NO GROWTH < 12 HOURS Performed at [place] [address], [place], [place] [address]    Report Status PENDING  Incomplete  Resp Panel by RT-PCR (Flu A&B, Covid) Nasopharyngeal Swab     Status: None   Collection Time: 03/27/21  5:58 PM   Specimen: Nasopharyngeal Swab; Nasopharyngeal(NP) swabs in vial transport medium  Result Value Ref Range Status   SARS Coronavirus 2 by RT PCR NEGATIVE NEGATIVE Final    Comment: (NOTE) SARS-CoV-2 target nucleic acids are NOT DETECTED.  The SARS-CoV-2 RNA is generally detectable in upper respiratory specimens during the acute phase of infection. The lowest concentration of SARS-CoV-2 viral copies this assay can detect is 138 copies/mL. A negative result does not preclude SARS-Cov-2 infection and should not be used as the sole basis for treatment or other patient management decisions. A negative result may occur with  improper specimen collection/handling, submission of specimen other than nasopharyngeal swab, presence of viral mutation(s) within the areas targeted by this assay, and inadequate number of viral copies(<138 copies/mL). A negative result must be combined with clinical observations, patient history, and epidemiological information. The expected result is Negative.  Fact Sheet for Patients:  EntrepreneurPulse.com.au  Fact Sheet for Healthcare Providers:  IncredibleEmployment.be  This test is no t yet approved or cleared by the [place] FDA and  has been authorized for detection and/or diagnosis of SARS-CoV-2 by FDA under an Emergency Use Authorization (EUA). This EUA will remain  in effect (meaning this test can be used) for the duration of  the COVID-19 declaration under Section 564(b)(1) of the Act, 21 U.S.C.section 360bbb-3(b)(1), unless the authorization is terminated  or revoked sooner.       Influenza A by  PCR NEGATIVE NEGATIVE Final   Influenza B by PCR NEGATIVE NEGATIVE Final    Comment: (NOTE) The Xpert Xpress SARS-CoV-2/FLU/RSV plus assay is intended as an aid in the diagnosis of influenza from Nasopharyngeal swab specimens and should not be used as a sole basis for treatment. Nasal washings and aspirates are unacceptable for Xpert Xpress SARS-CoV-2/FLU/RSV testing.  Fact Sheet for Patients: EntrepreneurPulse.com.au  Fact Sheet for Healthcare Providers: IncredibleEmployment.be  This test is not yet approved or cleared by the [place] FDA and has been authorized for detection and/or diagnosis of SARS-CoV-2 by FDA under an Emergency Use Authorization (EUA). This EUA will remain in effect (meaning this test can be used) for the duration of the COVID-19 declaration under Section 564(b)(1) of the Act, 21 U.S.C. section 360bbb-3(b)(1), unless the authorization is terminated or revoked.  Performed at [facility], [place] [address] [place], [place] [address]      Medications:    atorvastatin  10 mg Oral Daily   pantoprazole (PROTONIX) IV  40 mg Intravenous Q12H   Continuous Infusions:  sodium chloride 125 mL/hr at 03/27/21 2313   lactated ringers Stopped (03/27/21 2317)   piperacillin-tazobactam (ZOSYN)  IV 3.375 g (03/28/21 0117)      LOS: 0 days   [name]  [facility]  03/28/2021, 7:08 AM       ``

## 2021-03-28 NOTE — Assessment & Plan Note (Signed)
-   will monitor on tele avoid QT prolonging medications, rehydrate correct electrolytes Will repeat EKG once heart rate down. Correct electrolytes

## 2021-03-28 NOTE — Assessment & Plan Note (Signed)
Will replace and recheck 

## 2021-03-28 NOTE — Assessment & Plan Note (Addendum)
Wife reports episode of significant coughing after severe heartburn episode with a sensation of acid coming up to his throat and that he is choking on it.  Chest x-ray showing questionable right lower lobe infiltrate.  Given low-grade fever.  For now cover with Zosyn and continue to monitor. Continue PPI

## 2021-03-28 NOTE — Progress Notes (Signed)
1mg  Morphine wasted in stericycle, witnessed by Kelby Fam, RN

## 2021-03-28 NOTE — Assessment & Plan Note (Signed)
Secondary to dehydration will rehydrate and follow urine status

## 2021-03-28 NOTE — Progress Notes (Signed)
PHARMACY - PHYSICIAN COMMUNICATION CRITICAL VALUE ALERT - BLOOD CULTURE IDENTIFICATION (BCID)  Juan Snow is an 66 y.o. male who presented to [facility] on 03/27/2021 with a chief complaint of abdominal pain fever and chills admitted for sepsis started empirically on antibiotics.  Assessment:  Bcx: 4/4 bottles with GNR.  BCID Escherichia coli (CTX-M ESBL not detected).    Name of physician (or Provider) Contacted: Dr Aileen Fass  Current antibiotics: Zosyn  Changes to prescribed antibiotics recommended:  Response not received from provider;  current antibiotics are likely to cover the isolated organism.  Consider de-escalation soon.  Results for orders placed or performed during the hospital encounter of 03/27/21  Blood Culture ID Panel (Reflexed) (Collected: 03/27/2021  4:56 PM)  Result Value Ref Range   Enterococcus faecalis NOT DETECTED NOT DETECTED   Enterococcus Faecium NOT DETECTED NOT DETECTED   Listeria monocytogenes NOT DETECTED NOT DETECTED   Staphylococcus species NOT DETECTED NOT DETECTED   Staphylococcus aureus (BCID) NOT DETECTED NOT DETECTED   Staphylococcus epidermidis NOT DETECTED NOT DETECTED   Staphylococcus lugdunensis NOT DETECTED NOT DETECTED   Streptococcus species NOT DETECTED NOT DETECTED   Streptococcus agalactiae NOT DETECTED NOT DETECTED   Streptococcus pneumoniae NOT DETECTED NOT DETECTED   Streptococcus pyogenes NOT DETECTED NOT DETECTED   A.calcoaceticus-baumannii NOT DETECTED NOT DETECTED   Bacteroides fragilis NOT DETECTED NOT DETECTED   Enterobacterales DETECTED (A) NOT DETECTED   Enterobacter cloacae complex NOT DETECTED NOT DETECTED   Escherichia coli DETECTED (A) NOT DETECTED   Klebsiella aerogenes NOT DETECTED NOT DETECTED   Klebsiella oxytoca NOT DETECTED NOT DETECTED   Klebsiella pneumoniae NOT DETECTED NOT DETECTED   Proteus species NOT DETECTED NOT DETECTED   Salmonella species NOT DETECTED NOT DETECTED   Serratia marcescens NOT  DETECTED NOT DETECTED   Haemophilus influenzae NOT DETECTED NOT DETECTED   Neisseria meningitidis NOT DETECTED NOT DETECTED   Pseudomonas aeruginosa NOT DETECTED NOT DETECTED   Stenotrophomonas maltophilia NOT DETECTED NOT DETECTED   Candida albicans NOT DETECTED NOT DETECTED   Candida auris NOT DETECTED NOT DETECTED   Candida glabrata NOT DETECTED NOT DETECTED   Candida krusei NOT DETECTED NOT DETECTED   Candida parapsilosis NOT DETECTED NOT DETECTED   Candida tropicalis NOT DETECTED NOT DETECTED   Cryptococcus neoformans/gattii NOT DETECTED NOT DETECTED   CTX-M ESBL NOT DETECTED NOT DETECTED   Carbapenem resistance IMP NOT DETECTED NOT DETECTED   Carbapenem resistance KPC NOT DETECTED NOT DETECTED   Carbapenem resistance NDM NOT DETECTED NOT DETECTED   Carbapenem resist OXA 48 LIKE NOT DETECTED NOT DETECTED   Carbapenem resistance VIM NOT DETECTED NOT DETECTED   Gretta Arab PharmD, BCPS Clinical Pharmacist [facility] main pharmacy [phone] 03/28/2021 8:51 AM

## 2021-03-28 NOTE — Assessment & Plan Note (Signed)
Discussed with Dr. Collene Mares with GI.  Will see patient in consult in AM.  Restart Protonix abdominal pain most suggestive of GERD/dyspepsia/gastritis.  Lipase unremarkable.  May need HIDA scan in a.m. will defer to GI.

## 2021-03-28 NOTE — Consult Note (Addendum)
Reason for Consult:Abdominal pain and fever. Referring Physician: [facility].  Juan Snow is an 66 y.o. male.  HPI: Juan Snow is a 66 year old black male with a longstanding history of acid reflux and hyperlipidemia admitted with a history of epigastric pain and low-grade fevers with temperature 100.30F.  Patient decided to stop taking his Nexium on his own has been drinking apple cider instead as he felt he could use the apple cider vinegar instead of the PPIs. He subsequently developed severe abdominal pain with nausea and vomiting, fever and chills, and this prompted him to come to the emergency room when he was found to have a 2 cm hypoechoic focus at the neck of the gallbladder, on right upper quadrant ultrasound consistent with a gallstone with no evidence of acute cholecystitis and no dilatation of the bile ducts. He was diagnosed with sepsis and was given 1 dose of  Zosyn yesterday. Blood cultures show E. coli and Enterobacterales.  He had a HIDA scan done today that showed poor excretion of the radiotracer of the liver suggestive of hepatic dysfunction such as hepatitis and was no evidence of common bile duct obstruction; the bladder failed to fill after morphine injection but the sensitivity of this exam is lessened due to poor excretion of radiotracer from the liver.  Patient claims he has had no nausea vomiting or abdominal pain since he got the Dilaudid and the PPIs after admission.  His last BM was couple of days ago.  He denies any problems with constipation or rectal bleeding.  He had a colonoscopy done in 2013 and was noted to have pandiverticulosis and adenomatous polyps were removed.  He was due for follow-up colonoscopy but had not responded to multiple phone calls made by the office staff.  Evidence of AKI with a creatinine of 1.41 which is improved to 1.03 with hydration.  He has Xarelto listed on his medications but he is not taking that since he had his knee surgery.                                                                                                                                                                                                   Past Medical History:  Diagnosis Date   Arthritis    OA   Cough    WHITE SPUTUM OCC, NO FEVER COLD SYMPTOMS MAINLY   GERD (gastroesophageal reflux disease)    Past Surgical History:  Procedure Laterality Date   COLONOSCOPY  06/17/2011   Procedure: COLONOSCOPY;  Surgeon: [name], MD;  Location: [facility];  Service: Endoscopy;  Laterality: N/A;   KNEE SURGERY Right YRS AGO   MENISCUS   THUMB ARTHROSCOPY Right YRS AGO   TOTAL HIP ARTHROPLASTY Left 06/25/2017   Procedure: LEFT TOTAL HIP ARTHROPLASTY ANTERIOR APPROACH;  Surgeon: Gaynelle Arabian, MD;  Location: [facility];  Service: Orthopedics;  Laterality: Left;   Family History  Problem Relation Age of Onset   CAD Brother    Hypertension Other    Diabetes Neg Hx    Social History:  reports that he has never smoked. He has never used smokeless tobacco. He reports that he does not drink alcohol and does not use drugs.  Allergies: No Known Allergies  Medications: I have reviewed the patient's current medications. Prior to Admission:  Medications Prior to Admission  Medication Sig Dispense Refill Last Dose   acetaminophen (TYLENOL) 500 MG tablet Take 500 mg by mouth every 6 (six) hours as needed (for pain).   03/25/2021 at pm   atorvastatin (LIPITOR) 10 MG tablet Take 1 tablet (10 mg total) by mouth daily. 90 tablet 3 03/25/2021   Cholecalciferol (VITAMIN D3) 50 MCG (2000 UT) TABS Take 8,000 Units by mouth daily.   03/24/2021   Coenzyme Q10 (CO Q-10 PO) Take 1 capsule by mouth daily.   03/24/2021   esomeprazole (NEXIUM) 20 MG capsule Take 20 mg by mouth daily before breakfast.   Past Week   ibuprofen (ADVIL) 200 MG tablet Take 600 mg by mouth every 6 (six) hours as needed (for pain).   03/25/2021 at pm   Menaquinone-7 (VITAMIN K2 PO) Take 1 tablet by  mouth daily.   03/24/2021   vitamin C (ASCORBIC ACID) 500 MG tablet Take 500-1,000 mg by mouth daily.   Past Week   atorvastatin (LIPITOR) 10 MG tablet TAKE 1 TABLET BY MOUTH ONCE A DAY (Patient not taking: Reported on 03/27/2021) 90 tablet 1 Not Taking at am   atorvastatin (LIPITOR) 10 MG tablet Take 1 tablet by mouth once a day (Patient not taking: Reported on 03/27/2021) 90 tablet 0 Not Taking   atorvastatin (LIPITOR) 10 MG tablet Take 1 tablet (10 mg total) by mouth daily. (Patient not taking: Reported on 03/27/2021) 90 tablet 0 Not Taking   HYDROcodone-acetaminophen (NORCO) 7.5-325 MG tablet Take 1 tablet by mouth every 4 (four) hours as needed for severe pain (pain score 7-10). (Patient not taking: Reported on 03/27/2021) 40 tablet 0 Not Taking   methocarbamol (ROBAXIN) 500 MG tablet Take 1 tablet (500 mg total) by mouth every 6 (six) hours as needed for muscle spasms. (Patient not taking: Reported on 03/27/2021) 40 tablet 0 Not Taking   rivaroxaban (XARELTO) 10 MG TABS tablet Take 1 tablet (10 mg total) by mouth daily with breakfast. (Patient not taking: Reported on 03/27/2021) 20 tablet 0 Not Taking   traMADol (ULTRAM) 50 MG tablet Take 1-2 tablets (50-100 mg total) by mouth every 6 (six) hours as needed for moderate pain. (Patient not taking: Reported on 03/27/2021) 40 tablet 0 Not Taking   Scheduled:  atorvastatin  10 mg Oral Daily    morphine injection  3 mg Intravenous Once   pantoprazole (PROTONIX) IV  40 mg Intravenous Q12H   potassium chloride  40 mEq Oral BID   Continuous:  dextrose 5 % and 0.45% NaCl 75 mL/hr at 03/28/21 0925   piperacillin-tazobactam (ZOSYN)  IV Stopped (03/28/21 0713)   [id number] **OR** acetaminophen, HYDROcodone-acetaminophen, morphine injection, ondansetron (ZOFRAN) IV  Results for orders placed or performed during the hospital encounter of 03/27/21 (from the past  48 hour(s))  Lipase, blood     Status: None   Collection Time: 03/27/21  4:58 PM  Result  Value Ref Range   Lipase 24 11 - 51 U/L    Comment: Performed at [facility], [place] [address], [place], [place] [address]  Comprehensive metabolic panel     Status: Abnormal   Collection Time: 03/27/21  4:58 PM  Result Value Ref Range   Sodium 133 (L) 135 - 145 mmol/L   Potassium 3.9 3.5 - 5.1 mmol/L   Chloride 101 98 - 111 mmol/L   CO2 20 (L) 22 - 32 mmol/L   Glucose, Bld 155 (H) 70 - 99 mg/dL    Comment: Glucose reference range applies only to samples taken after fasting for at least 8 hours.   BUN 20 8 - 23 mg/dL   Creatinine, Ser 1.40 (H) 0.61 - 1.24 mg/dL   Calcium 9.2 8.9 - 10.3 mg/dL   Total Protein 7.1 6.5 - 8.1 g/dL   Albumin 4.0 3.5 - 5.0 g/dL   AST 39 15 - 41 U/L   ALT 77 (H) 0 - 44 U/L   Alkaline Phosphatase 103 38 - 126 U/L   Total Bilirubin 4.6 (H) 0.3 - 1.2 mg/dL   GFR, Estimated 56 (L) >60 mL/min    Comment: (NOTE) Calculated using the CKD-EPI Creatinine Equation (2021)    Anion gap 12 5 - 15    Comment: Performed at [facility], [place] [address], [place], [place] [address]  Type and screen [place]     Status: None   Collection Time: 03/27/21  4:58 PM  Result Value Ref Range   ABO/RH(D) B POS    Antibody Screen NEG    Sample Expiration      03/30/2021,2359 Performed at [facility], [place] [address], [place], [place] [address]   CBC with Differential     Status: Abnormal   Collection Time: 03/27/21  4:58 PM  Result Value Ref Range   WBC 13.9 (H) 4.0 - 10.5 K/uL   RBC 5.53 4.22 - 5.81 MIL/uL   Hemoglobin 17.2 (H) 13.0 - 17.0 g/dL   HCT 49.8 39.0 - 52.0 %   MCV 90.1 80.0 - 100.0 fL   MCH 31.1 26.0 - 34.0 pg   MCHC 34.5 30.0 - 36.0 g/dL   RDW 13.1 11.5 - 15.5 %   Platelets 117 (L) 150 - 400 K/uL    Comment: SPECIMEN CHECKED FOR CLOTS Immature Platelet Fraction may be clinically indicated, consider ordering this additional test [id number] REPEATED TO VERIFY PLATELET COUNT  CONFIRMED BY SMEAR    nRBC 0.0 0.0 - 0.2 %   Neutrophils Relative % 97 %   Neutro Abs 13.4 (H) 1.7 - 7.7 K/uL   Lymphocytes Relative 1 %   Lymphs Abs 0.1 (L) 0.7 - 4.0 K/uL   Monocytes Relative 2 %   Monocytes Absolute 0.3 0.1 - 1.0 K/uL   Eosinophils Relative 0 %   Eosinophils Absolute 0.0 0.0 - 0.5 K/uL   Basophils Relative 0 %   Basophils Absolute 0.0 0.0 - 0.1 K/uL   Immature Granulocytes 0 %   Abs Immature Granulocytes 0.06 0.00 - 0.07 K/uL    Comment: Performed at [facility], [place] [address] [place], [place] [address]  Lactic acid, plasma     Status: Abnormal   Collection Time: 03/27/21  4:58 PM  Result Value Ref Range   Lactic Acid, Venous  2.2 (HH) 0.5 - 1.9 mmol/L    Comment: CRITICAL RESULT CALLED TO, READ BACK BY AND VERIFIED WITH: BRATU,D. EMTP AT 1758 03/27/21 MULLINS,T Performed at [facility], [place] [address] [place], [place] [address]   Magnesium     Status: None   Collection Time: 03/27/21  4:58 PM  Result Value Ref Range   Magnesium 1.7 1.7 - 2.4 mg/dL    Comment: Performed at [facility], [place] [address] [place], [place] [address]  Resp Panel by RT-PCR (Flu A&B, Covid) Nasopharyngeal Swab     Status: None   Collection Time: 03/27/21  5:58 PM   Specimen: Nasopharyngeal Swab; Nasopharyngeal(NP) swabs in vial transport medium  Result Value Ref Range   SARS Coronavirus 2 by RT PCR NEGATIVE NEGATIVE    Comment: (NOTE) SARS-CoV-2 target nucleic acids are NOT DETECTED.  The SARS-CoV-2 RNA is generally detectable in upper respiratory specimens during the acute phase of infection. The lowest concentration of SARS-CoV-2 viral copies this assay can detect is 138 copies/mL. A negative result does not preclude SARS-Cov-2 infection and should not be used as the sole basis for treatment or other patient management decisions. A negative result may occur with  improper specimen collection/handling, submission of  specimen other than nasopharyngeal swab, presence of viral mutation(s) within the areas targeted by this assay, and inadequate number of viral copies(<138 copies/mL). A negative result must be combined with clinical observations, patient history, and epidemiological information. The expected result is Negative.  Fact Sheet for Patients:  EntrepreneurPulse.com.au  Fact Sheet for Healthcare Providers:  IncredibleEmployment.be  This test is no t yet approved or cleared by the [place] FDA and  has been authorized for detection and/or diagnosis of SARS-CoV-2 by FDA under an Emergency Use Authorization (EUA). This EUA will remain  in effect (meaning this test can be used) for the duration of the COVID-19 declaration under Section 564(b)(1) of the Act, 21 U.S.C.section 360bbb-3(b)(1), unless the authorization is terminated  or revoked sooner.       Influenza A by PCR NEGATIVE NEGATIVE   Influenza B by PCR NEGATIVE NEGATIVE    Comment: (NOTE) The Xpert Xpress SARS-CoV-2/FLU/RSV plus assay is intended as an aid in the diagnosis of influenza from Nasopharyngeal swab specimens and should not be used as a sole basis for treatment. Nasal washings and aspirates are unacceptable for Xpert Xpress SARS-CoV-2/FLU/RSV testing.  Fact Sheet for Patients: EntrepreneurPulse.com.au  Fact Sheet for Healthcare Providers: IncredibleEmployment.be  This test is not yet approved or cleared by the [place] FDA and has been authorized for detection and/or diagnosis of SARS-CoV-2 by FDA under an Emergency Use Authorization (EUA). This EUA will remain in effect (meaning this test can be used) for the duration of the COVID-19 declaration under Section 564(b)(1) of the Act, 21 U.S.C. section 360bbb-3(b)(1), unless the authorization is terminated or revoked.  Performed at [facility], [place] [address] [place], [place] [address]   Lactic acid, plasma     Status: None   Collection Time: 03/27/21  8:27 PM  Result Value Ref Range   Lactic Acid, Venous 1.6 0.5 - 1.9 mmol/L    Comment: Performed at [facility], [place] [address] [place], [place] [address]  Urinalysis, Routine w reflex microscopic Urine, Clean Catch     Status: Abnormal   Collection Time: 03/27/21  8:37 PM  Result Value Ref Range   Color, Urine AMBER (A) YELLOW    Comment: BIOCHEMICALS MAY  BE AFFECTED BY COLOR   APPearance HAZY (A) CLEAR   Specific Gravity, Urine 1.029 1.005 - 1.030   pH 5.0 5.0 - 8.0   Glucose, UA NEGATIVE NEGATIVE mg/dL   Hgb urine dipstick NEGATIVE NEGATIVE   Bilirubin Urine SMALL (A) NEGATIVE   Ketones, ur 20 (A) NEGATIVE mg/dL   Protein, ur 100 (A) NEGATIVE mg/dL   Nitrite NEGATIVE NEGATIVE   Leukocytes,Ua NEGATIVE NEGATIVE   RBC / HPF 0-5 0 - 5 RBC/hpf   WBC, UA 6-10 0 - 5 WBC/hpf   Bacteria, UA NONE SEEN NONE SEEN   Mucus PRESENT     Comment: Performed at [facility], [place] [address] [place], [place] [address]  Osmolality, urine     Status: None   Collection Time: 03/27/21  8:38 PM  Result Value Ref Range   Osmolality, Ur 801 300 - 900 mOsm/kg    Comment: Performed at [place] [address] [place], [place] [address]  Sodium, urine, random     Status: None   Collection Time: 03/27/21  8:38 PM  Result Value Ref Range   Sodium, Ur 35 mmol/L    Comment: Performed at [facility], [place] [address], [place], [place] [address]  Creatinine, urine, random     Status: None   Collection Time: 03/27/21  8:38 PM  Result Value Ref Range   Creatinine, Urine 424.62 mg/dL    Comment: RESULTS CONFIRMED BY MANUAL DILUTION Performed at [place] [address], [place], [place] [address]   Troponin I (High Sensitivity)     Status: None   Collection Time: 03/27/21 11:11 PM  Result Value Ref Range   Troponin I  (High Sensitivity) 16 <18 ng/L    Comment: (NOTE) Elevated high sensitivity troponin I (hsTnI) values and significant  changes across serial measurements may suggest ACS but many other  chronic and acute conditions are known to elevate hsTnI results.  Refer to the "Links" section for chest pain algorithms and additional  guidance. Performed at [facility], [place] [address], [place], [place] [address]   Acetaminophen level     Status: Abnormal   Collection Time: 03/27/21 11:11 PM  Result Value Ref Range   Acetaminophen (Tylenol), Serum <10 (L) 10 - 30 ug/mL    Comment: (NOTE) Therapeutic concentrations vary significantly. A range of 10-30 ug/mL  may be an effective concentration for many patients. However, some  are best treated at concentrations outside of this range. Acetaminophen concentrations >150 ug/mL at 4 hours after ingestion  and >50 ug/mL at 12 hours after ingestion are often associated with  toxic reactions.  Performed at [facility], [place] [address] [place], [place] [address]   Blood gas, venous     Status: Abnormal   Collection Time: 03/27/21 11:11 PM  Result Value Ref Range   pH, Ven 7.429 7.250 - 7.430   pCO2, Ven 38.6 (L) 44.0 - 60.0 mmHg   pO2, Ven 69.1 (H) 32.0 - 45.0 mmHg   Bicarbonate 25.1 20.0 - 28.0 mmol/L   Acid-Base Excess 1.3 0.0 - 2.0 mmol/L   O2 Saturation 92.7 %   Patient temperature 98.6     Comment: Performed at [facility], [place] [address] [place], [place] [address]  Salicylate level     Status: Abnormal   Collection Time: 03/27/21 11:11 PM  Result Value Ref Range   Salicylate Lvl <0.1 (L) 7.0 - 30.0 mg/dL  Comment: Performed at [facility], [place] [address], [place], [place] [address]  D-dimer, quantitative     Status: Abnormal   Collection Time: 03/27/21 11:11 PM  Result Value Ref Range   D-Dimer, Quant 1.05 (H) 0.00 - 0.50 ug/mL-FEU    Comment: (NOTE) At the  manufacturer cut-off value of 0.5 g/mL FEU, this assay has a negative predictive value of 95-100%.This assay is intended for use in conjunction with a clinical pretest probability (PTP) assessment model to exclude pulmonary embolism (PE) and deep venous thrombosis (DVT) in outpatients suspected of PE or DVT. Results should be correlated with clinical presentation. Performed at [facility], [place] [address] [place], [place] [address]   Lactic acid, plasma     Status: None   Collection Time: 03/27/21 11:11 PM  Result Value Ref Range   Lactic Acid, Venous 1.6 0.5 - 1.9 mmol/L    Comment: Performed at [facility], [place] [address], [place], [place] [address]  Procalcitonin     Status: None   Collection Time: 03/27/21 11:11 PM  Result Value Ref Range   Procalcitonin 34.45 ng/mL    Comment:        Interpretation: PCT >= 10 ng/mL: Important systemic inflammatory response, almost exclusively due to severe bacterial sepsis or septic shock. (NOTE)       Sepsis PCT Algorithm           Lower Respiratory Tract                                      Infection PCT Algorithm    ----------------------------     ----------------------------         PCT < 0.25 ng/mL                PCT < 0.10 ng/mL          Strongly encourage             Strongly discourage   discontinuation of antibiotics    initiation of antibiotics    ----------------------------     -----------------------------       PCT 0.25 - 0.50 ng/mL            PCT 0.10 - 0.25 ng/mL               OR       >80% decrease in PCT            Discourage initiation of                                            antibiotics      Encourage discontinuation           of antibiotics    ----------------------------     -----------------------------         PCT >= 0.50 ng/mL              PCT 0.26 - 0.50 ng/mL                AND       <80% decrease in PCT             Encourage initiation of  antibiotics       Encourage continuation           of antibiotics    ----------------------------     -----------------------------        PCT >= 0.50 ng/mL                  PCT > 0.50 ng/mL               AND         increase in PCT                  Strongly encourage                                      initiation of antibiotics    Strongly encourage escalation           of antibiotics                                     -----------------------------                                           PCT <= 0.25 ng/mL                                                 OR                                        > 80% decrease in PCT                                      Discontinue / Do not initiate                                             antibiotics  Performed at [place] [address], [place], [place] [address]   Bilirubin, fractionated(tot/dir/indir)     Status: Abnormal   Collection Time: 03/27/21 11:11 PM  Result Value Ref Range   Total Bilirubin 5.4 (H) 0.3 - 1.2 mg/dL   Bilirubin, Direct 3.1 (H) 0.0 - 0.2 mg/dL   Indirect Bilirubin 2.3 (H) 0.3 - 0.9 mg/dL    Comment: Performed at [facility], [place] [address] [place], [place] [address]  CK     Status: None   Collection Time: 03/27/21 11:11 PM  Result Value Ref Range   Total CK 93 49 - 397 U/L    Comment: Performed at [facility], [place] [address], [place], [place] [address]  Phosphorus     Status: None   Collection Time: 03/27/21 11:11 PM  Result Value Ref Range   Phosphorus 4.2 2.5 - 4.6 mg/dL    Comment: Performed at [facility], [place] [address] [place],  [place] [address]  TSH     Status: None   Collection Time: 03/27/21 11:11 PM  Result Value Ref Range   TSH 0.785 0.350 - 4.500 uIU/mL    Comment: Performed by a 3rd Generation assay with a functional sensitivity of <=0.01 uIU/mL. Performed at [facility],  [place] [address] [place], [place] [address]   Urine rapid drug screen (hosp performed)     Status: Abnormal   Collection Time: 03/28/21 12:15 AM  Result Value Ref Range   Opiates POSITIVE (A) NONE DETECTED   Cocaine NONE DETECTED NONE DETECTED   Benzodiazepines NONE DETECTED NONE DETECTED   Amphetamines NONE DETECTED NONE DETECTED   Tetrahydrocannabinol NONE DETECTED NONE DETECTED   Barbiturates NONE DETECTED NONE DETECTED    Comment: (NOTE) DRUG SCREEN FOR MEDICAL PURPOSES ONLY.  IF CONFIRMATION IS NEEDED FOR ANY PURPOSE, NOTIFY LAB WITHIN 5 DAYS.  LOWEST DETECTABLE LIMITS FOR URINE DRUG SCREEN Drug Class                     Cutoff (ng/mL) Amphetamine and metabolites    1000 Barbiturate and metabolites    200 Benzodiazepine                 195 Tricyclics and metabolites     300 Opiates and metabolites        300 Cocaine and metabolites        300 THC                            50 Performed at [facility], [place] [address], [place], [place] [address]   Lactic acid, plasma     Status: None   Collection Time: 03/28/21 12:15 AM  Result Value Ref Range   Lactic Acid, Venous 1.5 0.5 - 1.9 mmol/L    Comment: Performed at [facility], [place] [address] [place], [place] [address]  Troponin I (High Sensitivity)     Status: Abnormal   Collection Time: 03/28/21 12:15 AM  Result Value Ref Range   Troponin I (High Sensitivity) 18 (H) <18 ng/L    Comment: (NOTE) Elevated high sensitivity troponin I (hsTnI) values and significant  changes across serial measurements may suggest ACS but many other  chronic and acute conditions are known to elevate hsTnI results.  Refer to the "Links" section for chest pain algorithms and additional  guidance. Performed at [facility], [place] [address], [place], [place] [address]   Strep pneumoniae urinary antigen     Status: None   Collection Time: 03/28/21 12:15 AM  Result Value Ref Range    Strep Pneumo Urinary Antigen NEGATIVE NEGATIVE    Comment:        Infection due to S. pneumoniae cannot be absolutely ruled out since the antigen present may be below the detection limit of the test. Performed at [facility], [address] [place], [place] [address]   Magnesium     Status: Abnormal   Collection Time: 03/28/21  4:05 AM  Result Value Ref Range   Magnesium 1.4 (L) 1.7 - 2.4 mg/dL    Comment: Performed at [facility], [place] [address] [place],  [address]  Phosphorus     Status: None   Collection Time: 03/28/21  4:05 AM  Result Value Ref Range   Phosphorus 2.7 2.5 - 4.6 mg/dL    Comment: Performed at [facility], [place] [address] [place], [place] [address]  CBC WITH DIFFERENTIAL     Status: Abnormal   Collection Time: 03/28/21  4:05 AM  Result Value Ref Range   WBC 10.1 4.0 - 10.5 K/uL   RBC 4.79 4.22 - 5.81 MIL/uL   Hemoglobin 14.8 13.0 - 17.0 g/dL   HCT 44.2 39.0 - 52.0 %   MCV 92.3 80.0 - 100.0 fL   MCH 30.9 26.0 - 34.0 pg   MCHC 33.5 30.0 - 36.0 g/dL   RDW 13.2 11.5 - 15.5 %   Platelets 108 (L) 150 - 400 K/uL    Comment: SPECIMEN CHECKED FOR CLOTS Immature Platelet Fraction may be clinically indicated, consider ordering this additional test [id number] CONSISTENT WITH PREVIOUS RESULT REPEATED TO VERIFY    nRBC 0.0 0.0 - 0.2 %   Neutrophils Relative % 91 %   Neutro Abs 9.1 (H) 1.7 - 7.7 K/uL   Lymphocytes Relative 2 %   Lymphs Abs 0.2 (L) 0.7 - 4.0 K/uL   Monocytes Relative 7 %   Monocytes Absolute 0.8 0.1 - 1.0 K/uL   Eosinophils Relative 0 %   Eosinophils Absolute 0.0 0.0 - 0.5 K/uL   Basophils Relative 0 %   Basophils Absolute 0.0 0.0 - 0.1 K/uL   Immature Granulocytes 0 %   Abs Immature Granulocytes 0.04 0.00 - 0.07 K/uL    Comment: Performed at [facility], [place] [address] [place], [place] [address]  Comprehensive metabolic panel     Status: Abnormal   Collection Time:  03/28/21  4:05 AM  Result Value Ref Range   Sodium 141 135 - 145 mmol/L    Comment: DELTA CHECK NOTED   Potassium 3.0 (L) 3.5 - 5.1 mmol/L    Comment: DELTA CHECK NOTED   Chloride 118 (H) 98 - 111 mmol/L   CO2 18 (L) 22 - 32 mmol/L   Glucose, Bld 87 70 - 99 mg/dL    Comment: Glucose reference range applies only to samples taken after fasting for at least 8 hours.   BUN 15 8 - 23 mg/dL   Creatinine, Ser 1.03 0.61 - 1.24 mg/dL   Calcium 5.8 (LL) 8.9 - 10.3 mg/dL    Comment: CRITICAL RESULT CALLED TO, READ BACK BY AND VERIFIED WITH:  FELICIA GREEN RN 9/32/35 @ 0449 VS    Total Protein 3.9 (L) 6.5 - 8.1 g/dL   Albumin 2.0 (L) 3.5 - 5.0 g/dL   AST 24 15 - 41 U/L   ALT 45 (H) 0 - 44 U/L   Alkaline Phosphatase 58 38 - 126 U/L   Total Bilirubin 3.9 (H) 0.3 - 1.2 mg/dL   GFR, Estimated >60 >60 mL/min    Comment: (NOTE) Calculated using the CKD-EPI Creatinine Equation (2021)    Anion gap 5 5 - 15    Comment: Performed at [facility], [place] [address], [place], [place] [address]    DG Chest Port 1 View  Result Date: 03/27/2021 CLINICAL DATA:  Epigastric pain, vomiting EXAM: PORTABLE CHEST 1 VIEW COMPARISON:  None. FINDINGS: Cardiac size is within normal limits. There are no signs of pulmonary edema. There are linear densities in the right lower lung fields. There is no pleural effusion or pneumothorax. IMPRESSION: Linear densities in right lower lung fields suggest scarring or subsegmental atelectasis/pneumonia. Electronically Signed   By: Elmer Picker M.D.   On: 03/27/2021 17:43   US Abdomen Limited RUQ (LIVER/GB)  Result Date: 03/27/2021 CLINICAL DATA:  Abdominal pain EXAM: ULTRASOUND ABDOMEN LIMITED RIGHT UPPER QUADRANT COMPARISON:  None. FINDINGS: Gallbladder: There is 2 cm echogenic focus with acoustic shadowing in the neck of the gallbladder. Sludge is seen in the lumen of the gallbladder. Technologist did not observe any tenderness over the gallbladder. There  is no fluid around the gallbladder. Common bile duct: Diameter: 2.3 mm Liver: No focal lesion identified. Within normal limits in parenchymal echogenicity. Portal vein is patent on color Doppler imaging with normal direction of blood flow towards the liver. Other: None. IMPRESSION: There is 2 cm hyperechoic focus with acoustic shadowing in the neck of the gallbladder suggesting gallbladder stone. Sludge is seen in the lumen of the gallbladder. There are no sonographic signs of acute cholecystitis. There is no dilation of bile ducts. Electronically Signed   By: Elmer Picker M.D.   On: 03/27/2021 18:05    Review of Systems  Constitutional:  Positive for appetite change, chills, fatigue and fever. Negative for activity change, diaphoresis and unexpected weight change.  HENT: Negative.    Eyes: Negative.   Respiratory: Negative.    Cardiovascular: Negative.   Gastrointestinal:  Positive for abdominal pain, nausea and vomiting. Negative for anal bleeding, constipation, diarrhea and rectal pain.  Endocrine: Negative.   Genitourinary: Negative.   Skin: Negative.   Allergic/Immunologic: Negative.   Neurological: Negative.   Hematological: Negative.   Psychiatric/Behavioral: Negative.    Blood pressure 119/75, pulse 68, temperature 98.9 F (37.2 C), temperature source Oral, resp. rate (!) 22, height 5\' 8"  (1.727 m), weight 85.3 kg, SpO2 92 %. Physical Exam Constitutional:      Appearance: He is well-developed.  HENT:     Head: Normocephalic and atraumatic.     Mouth/Throat:     Mouth: Mucous membranes are moist.     Pharynx: Oropharynx is clear.  Eyes:     Extraocular Movements: Extraocular movements intact.  Cardiovascular:     Rate and Rhythm: Normal rate and regular rhythm.     Heart sounds: Normal heart sounds.  Pulmonary:     Effort: Pulmonary effort is normal.     Breath sounds: Normal breath sounds.  Abdominal:     General: Bowel sounds are decreased. There is no distension or  abdominal bruit.     Palpations: Abdomen is soft.     Tenderness: There is no abdominal tenderness.  Skin:    General: Skin is warm and dry.  Neurological:     General: No focal deficit present.     Mental Status: He is alert and oriented to person, place, and time.   Assessment/Plan: 1) Severe sepsis with E. coli on Zosyn; patient will need to have his colonoscopy as soon as possible once his acute symptoms resolve.  2) Epigastric pain with nausea and vomiting; TB 6.0; ALT 67, AST 35 and alkaline phosphatase of 91; cholelithiasis with a 2 cm gallstone in the neck of the gallbladder and no evidence of CBD obstruction noted on HIDA scan.  We will plan an MRCP tomorrow. An EGD will be planned as well 3) Acute kidney injury has improved after resuscitation with fluids.  Creatinine has improved from 1.4-1.03. 4) Hypokalemia/hypomagnesemia-we will need repletion ASAP. 5) Prolonged QT interval probably secondary to electrolyte disturbances 6) Hyperlipidemia on atorvastatin at home. 7) Pandiverticulosis. [name] 03/28/2021, 6:05 AM

## 2021-03-29 ENCOUNTER — Other Ambulatory Visit: Payer: Self-pay

## 2021-03-29 ENCOUNTER — Inpatient Hospital Stay (HOSPITAL_COMMUNITY): Payer: 59

## 2021-03-29 LAB — COMPREHENSIVE METABOLIC PANEL
ALT: 56 U/L — ABNORMAL HIGH (ref 0–44)
AST: 27 U/L (ref 15–41)
Albumin: 3.2 g/dL — ABNORMAL LOW (ref 3.5–5.0)
Alkaline Phosphatase: 92 U/L (ref 38–126)
Anion gap: 7 (ref 5–15)
BUN: 22 mg/dL (ref 8–23)
CO2: 24 mmol/L (ref 22–32)
Calcium: 8.6 mg/dL — ABNORMAL LOW (ref 8.9–10.3)
Chloride: 108 mmol/L (ref 98–111)
Creatinine, Ser: 1.52 mg/dL — ABNORMAL HIGH (ref 0.61–1.24)
GFR, Estimated: 51 mL/min — ABNORMAL LOW (ref >60)
Glucose, Bld: 107 mg/dL — ABNORMAL HIGH (ref 70–99)
Potassium: 4 mmol/L (ref 3.5–5.1)
Sodium: 139 mmol/L (ref 135–145)
Total Bilirubin: 2.7 mg/dL — ABNORMAL HIGH (ref 0.3–1.2)
Total Protein: 6 g/dL — ABNORMAL LOW (ref 6.5–8.1)

## 2021-03-29 LAB — HEPATITIS PANEL, ACUTE
HCV Ab: NONREACTIVE
Hep A IgM: NONREACTIVE
Hep B C IgM: NONREACTIVE
Hepatitis B Surface Ag: NONREACTIVE

## 2021-03-29 LAB — CBC
HCT: 42.9 % (ref 39.0–52.0)
Hemoglobin: 14.4 g/dL (ref 13.0–17.0)
MCH: 30.7 pg (ref 26.0–34.0)
MCHC: 33.6 g/dL (ref 30.0–36.0)
MCV: 91.5 fL (ref 80.0–100.0)
Platelets: 134 10*3/uL — ABNORMAL LOW (ref 150–400)
RBC: 4.69 MIL/uL (ref 4.22–5.81)
RDW: 13.4 % (ref 11.5–15.5)
WBC: 5.5 10*3/uL (ref 4.0–10.5)
nRBC: 0 % (ref 0.0–0.2)

## 2021-03-29 LAB — MAGNESIUM: Magnesium: 2.3 mg/dL (ref 1.7–2.4)

## 2021-03-29 IMAGING — MR MR ABDOMEN WO/W CM MRCP
15 of 21 series · 33 of 48 positions shown · IV contrast (9 GADAVIST)
Comparison: Ultrasound [DATE], CT [DATE], and
nuclear medicine HIDA scan [DATE]
COMPARISON: Ultrasound [DATE], CT [DATE], and
nuclear medicine HIDA scan [DATE]

Addendum:
CLINICAL DATA: Cholelithiasis. Right upper quadrant abdominal pain.

EXAM:
MRI ABDOMEN WITHOUT AND WITH CONTRAST (INCLUDING MRCP)
TECHNIQUE: Multiplanar multisequence MR imaging of the abdomen was performed
both before and after the administration of intravenous contrast.
Heavily T2-weighted images of the biliary and pancreatic ducts were
obtained, and three-dimensional MRCP images were rendered by post
processing.
CONTRAST:  9mL GADAVIST GADOBUTROL 1 MMOL/ML IV SOLN

[Series 3: DWI · axial · 6.0mm · 1.49mm/px · z∈[-184,+96]mm · 2 of 80 slices shown (1 of 2)]
[im 1/80]
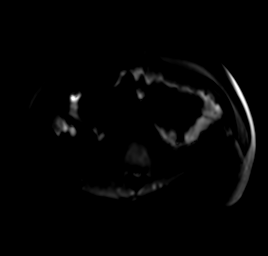
[im 80/80]
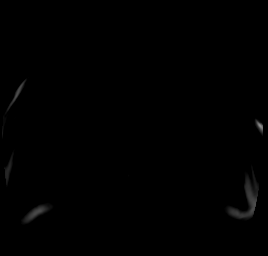

[Series 4: DWI · axial · 6.0mm · 1.49mm/px · 1 of 40 slices shown (2 of 2)]
[im 1/40]
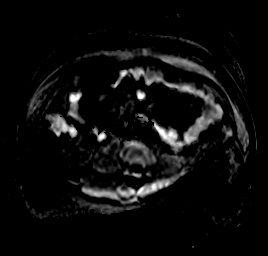

[Series 5: T2 fat-sat · axial · 6.0mm · 1.25mm/px · 1 of 41 slices shown]
[im 1/41]
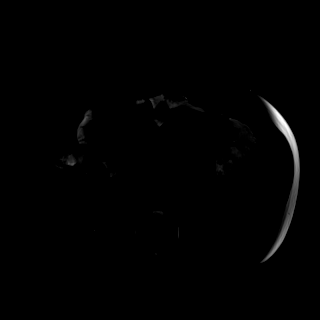

[Series 8: cor_3d_spc_trig · coronal · 1.0mm · 0.49mm/px · 3 of 80 slices shown]
[im 1/80]
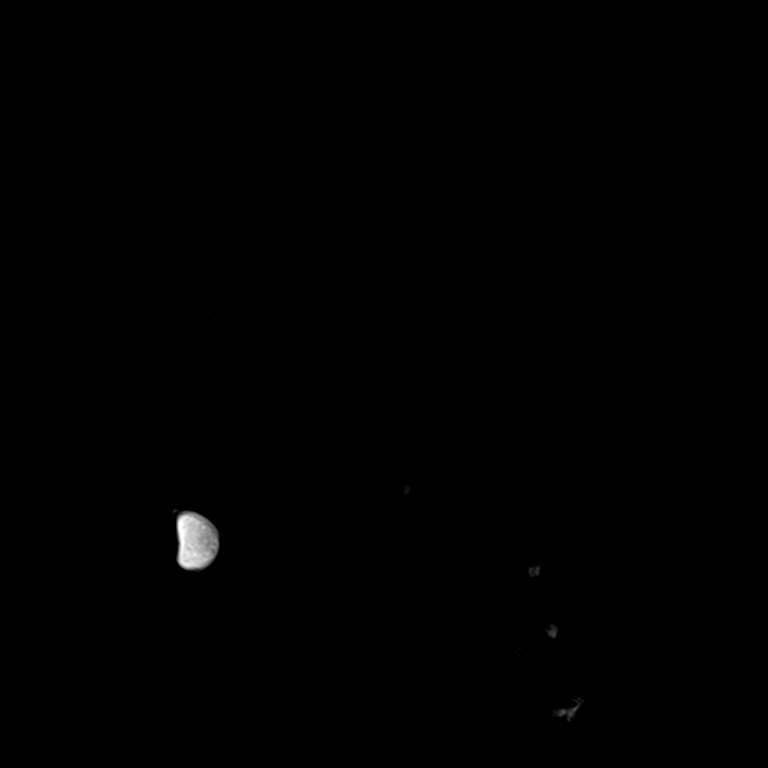
[im 40/80]
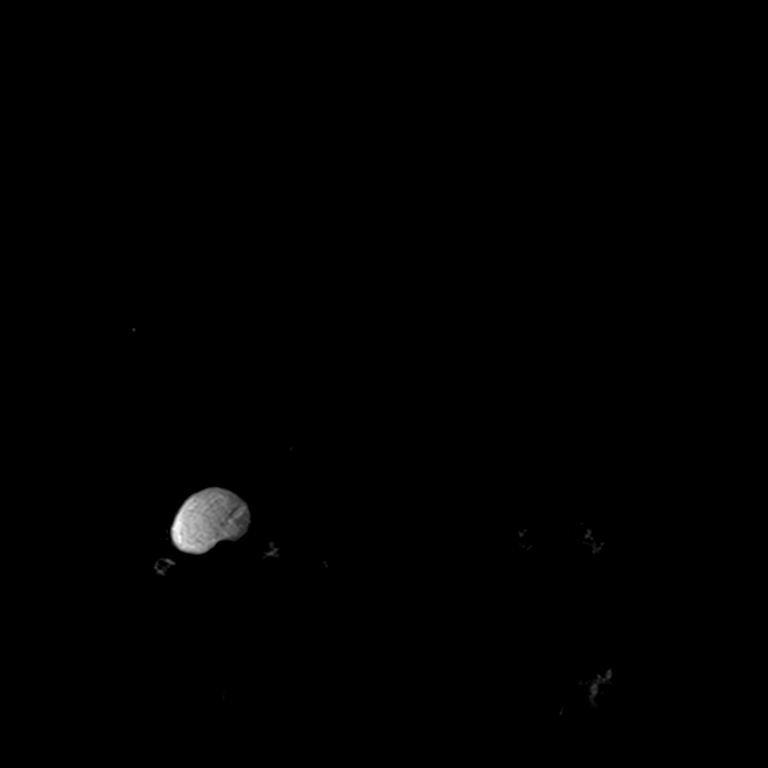
[im 80/80]
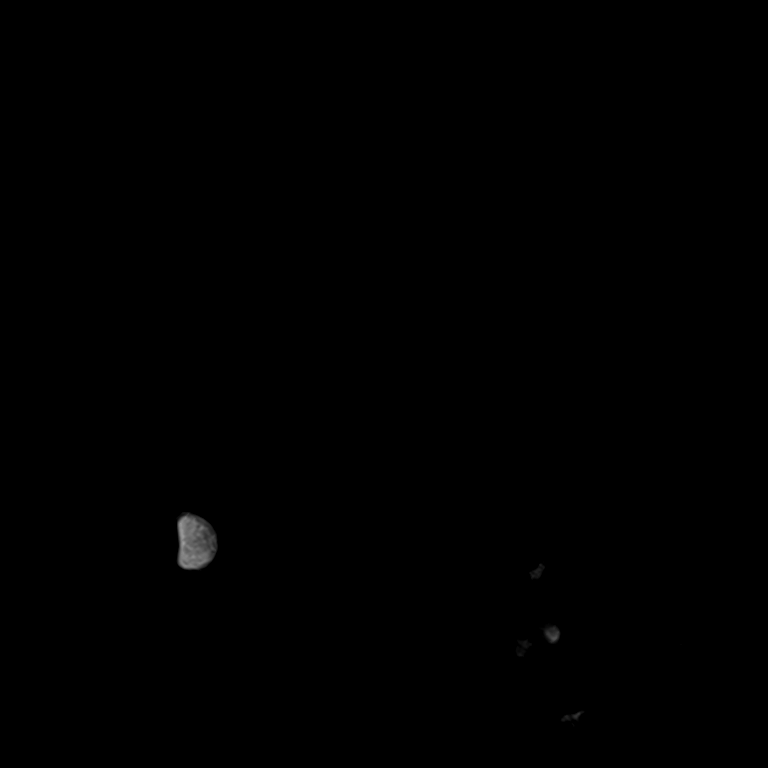

[Series 12: T2 · coronal · 6.0mm · 1.48mm/px · 1 of 36 slices shown (1 of 2)]
[im 1/36]
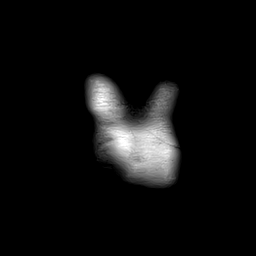

[Series 13: T1 · axial · 3.0mm · 1.25mm/px · z∈[-177,+84]mm · 3 of 88 slices shown (1 of 2)]
[im 1/88]
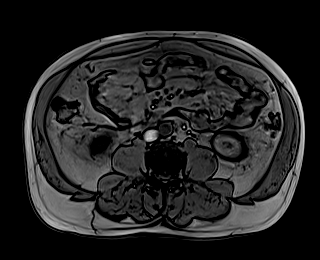
[im 44/88]
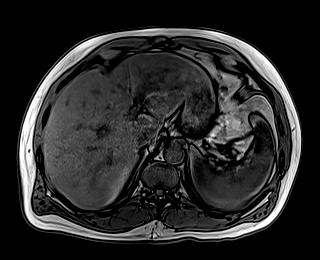
[im 88/88]
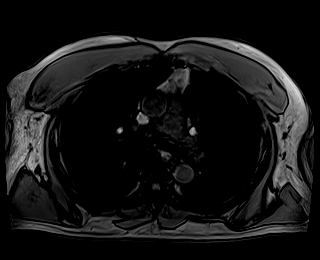

[Series 14: T1 · axial · 3.0mm · 1.25mm/px · z∈[-177,+84]mm · 3 of 88 slices shown (2 of 2)]
[im 1/88]
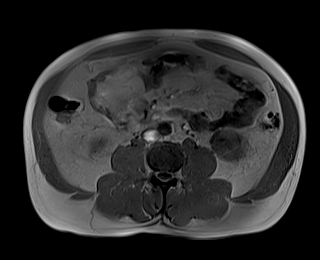
[im 44/88]
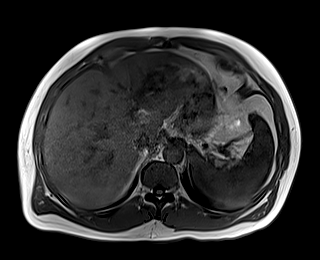
[im 88/88]
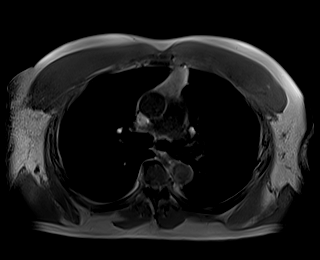

[Series 15: cor obl thk · sagittal · 50.0mm · 0.78mm/px · 1 of 9 slices shown]
[im 1/9]
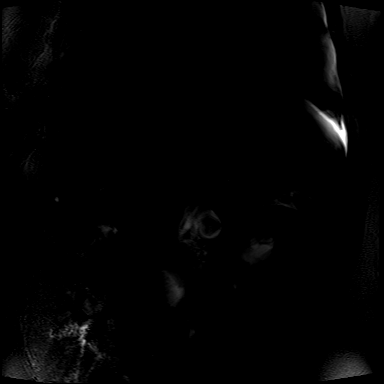

[Series 17: T2 · axial · 6.0mm · 1.56mm/px · 1 of 37 slices shown (2 of 2)]
[im 1/37]
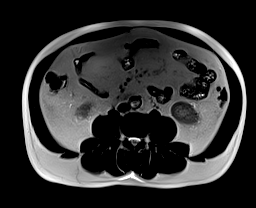

[Series 19: T1 dynamic · axial · 3.0mm · 1.25mm/px · z∈[-177,+84]mm · 3 of 88 slices shown (1 of 6)]
[im 1/88]
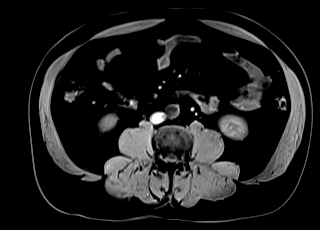
[im 44/88]
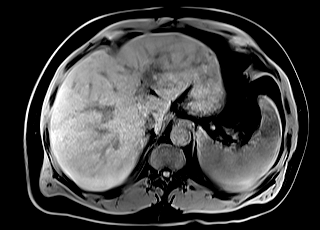
[im 88/88]
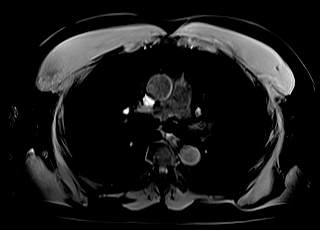

[Series 23: T1 dynamic · axial · 3.0mm · 1.25mm/px · z∈[-177,+84]mm · 3 of 88 slices shown (2 of 6)]
[im 1/88]
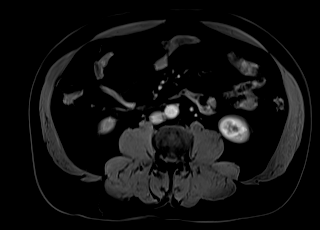
[im 44/88]
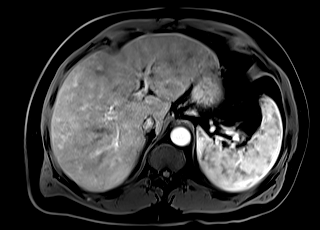
[im 88/88]
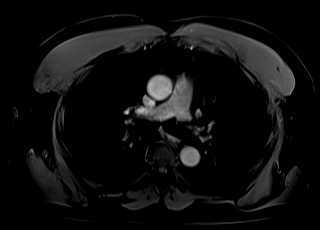

[Series 24: T1 dynamic · axial · 3.0mm · 1.25mm/px · z∈[-177,+84]mm · 3 of 88 slices shown (3 of 6)]
[im 1/88]
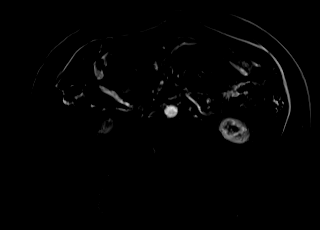
[im 44/88]
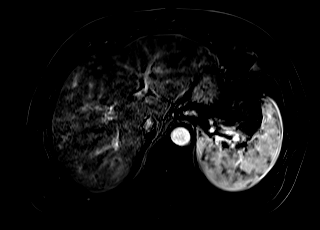
[im 88/88]
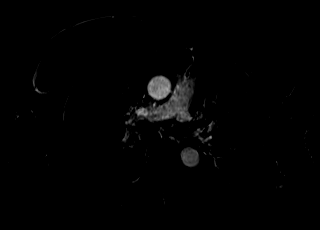

[Series 27: T1 dynamic · axial · 3.0mm · 1.25mm/px · z∈[-177,+84]mm · 3 of 88 slices shown (4 of 6)]
[im 1/88]
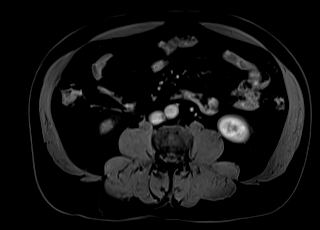
[im 44/88]
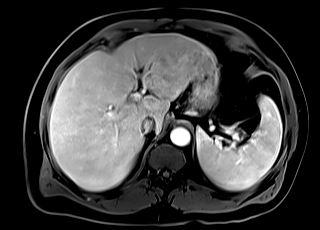
[im 88/88]
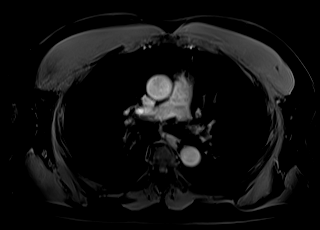

[Series 28: T1 dynamic · axial · 3.0mm · 1.25mm/px · z∈[-177,+84]mm · 3 of 88 slices shown (5 of 6)]
[im 1/88]
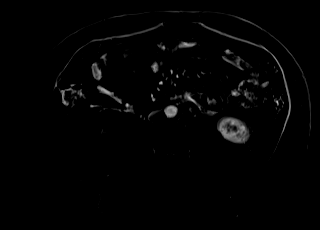
[im 44/88]
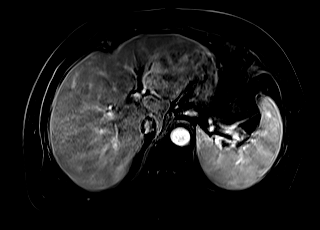
[im 88/88]
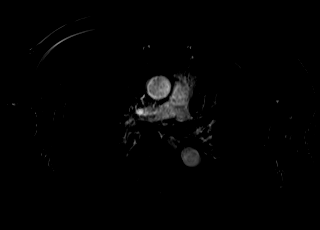

[Series 31: T1 dynamic · axial · 3.0mm · 1.25mm/px · z∈[-177,-48]mm · 2 of 88 slices shown (6 of 6)]
[im 1/88]
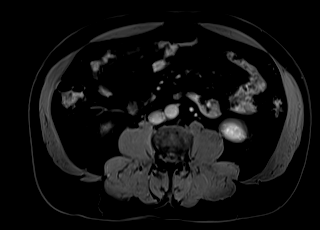
[im 44/88]
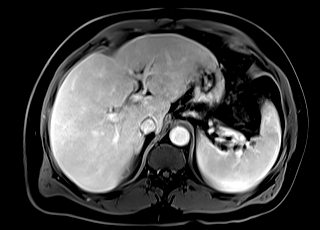

[33 of 48 positions shown; findings below may reference images not displayed]

FINDINGS: Lower chest: Heterogeneous signal in the lung bases, favor
atelectasis.

Hepatobiliary: No hepatic steatosis. Periportal edema. Scattered
tiny hepatic cysts. T2 hyperintense 18 mm segment II hepatic lesion
on image [DATE] which demonstrates nodular arterial enhancement with
filling in of the lesion with contrast on delayed postcontrast
imaging similar in intensity to background aorta, consistent with a
benign hepatic hemangioma.

Two large choledocholithiasis in a nondilated gallbladder without
gallbladder wall thickening or pericholecystic fluid. No biliary
ductal dilation and no choledocholithiasis.

Pancreas: Intrinsic T1 signal of the pancreatic parenchyma is within
normal limits. There is homogeneous enhancement of the pancreas
postcontrast administration. No pancreatic ductal dilation. No
cystic or solid hyperenhancing pancreatic lesions identified.

Spleen:  Within normal limits in size and appearance.

Adrenals/Urinary Tract: Bilateral adrenal glands appear normal. No
hydronephrosis. No solid enhancing renal mass.

Stomach/Bowel: Moderate-sized hiatal hernia. No evidence of acute
bowel inflammation or pathologic dilation of loops of small or large
bowel in the abdomen. Colonic diverticulosis without findings of
acute diverticulitis.

Vascular/Lymphatic: No abdominal aortic aneurysm. The portal,
splenic and superior mesenteric veins are patent. No pathologically
enlarged abdominal lymph nodes.

Other:  No abdominal ascites.

Musculoskeletal: No suspicious bone lesions identified.
IMPRESSION: 1. Two large choledocholithiasis, without findings of acute
cholecystitis, biliary ductal dilation or choledocholithiasis.
2. Periportal edema, nonspecific but in correlation with findings
recent HIDA scan most likely reflect hepatitis.
3. Moderate-sized hiatal hernia.
4. Heterogeneous signal in the lung bases, favor atelectasis.

ADDENDUM:
Dictation error occurred in the findings and impression section of
the report incorrectly states that there are 2 large
choledocholithiasis the report should state:

There are 2 large CHOLELITHIASIS without findings of acute
cholecystitis, biliary ductal dilation or choledocholithiasis.

*** End of Addendum ***
FINDINGS: Lower chest: Heterogeneous signal in the lung bases, favor
atelectasis.

Hepatobiliary: No hepatic steatosis. Periportal edema. Scattered
tiny hepatic cysts. T2 hyperintense 18 mm segment II hepatic lesion
on image [DATE] which demonstrates nodular arterial enhancement with
filling in of the lesion with contrast on delayed postcontrast
imaging similar in intensity to background aorta, consistent with a
benign hepatic hemangioma.

Two large choledocholithiasis in a nondilated gallbladder without
gallbladder wall thickening or pericholecystic fluid. No biliary
ductal dilation and no choledocholithiasis.

Pancreas: Intrinsic T1 signal of the pancreatic parenchyma is within
normal limits. There is homogeneous enhancement of the pancreas
postcontrast administration. No pancreatic ductal dilation. No
cystic or solid hyperenhancing pancreatic lesions identified.

Spleen:  Within normal limits in size and appearance.

Adrenals/Urinary Tract: Bilateral adrenal glands appear normal. No
hydronephrosis. No solid enhancing renal mass.

Stomach/Bowel: Moderate-sized hiatal hernia. No evidence of acute
bowel inflammation or pathologic dilation of loops of small or large
bowel in the abdomen. Colonic diverticulosis without findings of
acute diverticulitis.

Vascular/Lymphatic: No abdominal aortic aneurysm. The portal,
splenic and superior mesenteric veins are patent. No pathologically
enlarged abdominal lymph nodes.

Other:  No abdominal ascites.

Musculoskeletal: No suspicious bone lesions identified.
IMPRESSION: 1. Two large choledocholithiasis, without findings of acute
cholecystitis, biliary ductal dilation or choledocholithiasis.
2. Periportal edema, nonspecific but in correlation with findings
recent HIDA scan most likely reflect hepatitis.
3. Moderate-sized hiatal hernia.
4. Heterogeneous signal in the lung bases, favor atelectasis.

## 2021-03-29 IMAGING — MR MR 3D RECON AT SCANNER
15 of 21 series · 33 of 48 positions shown · IV contrast (gadavist)
Comparison: Ultrasound [DATE], CT [DATE], and
nuclear medicine HIDA scan [DATE]
COMPARISON: Ultrasound [DATE], CT [DATE], and
nuclear medicine HIDA scan [DATE]

Addendum:
CLINICAL DATA: Cholelithiasis. Right upper quadrant abdominal pain.

EXAM:
MRI ABDOMEN WITHOUT AND WITH CONTRAST (INCLUDING MRCP)
TECHNIQUE: Multiplanar multisequence MR imaging of the abdomen was performed
both before and after the administration of intravenous contrast.
Heavily T2-weighted images of the biliary and pancreatic ducts were
obtained, and three-dimensional MRCP images were rendered by post
processing.
CONTRAST:  9mL GADAVIST GADOBUTROL 1 MMOL/ML IV SOLN

[Series 3: DWI · axial · 6.0mm · 1.49mm/px · z∈[-184,+96]mm · 2 of 80 slices shown (1 of 2)]
[im 1/80]
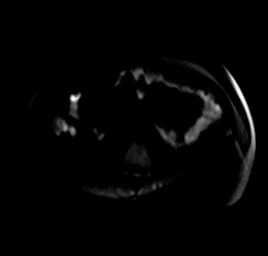
[im 80/80]
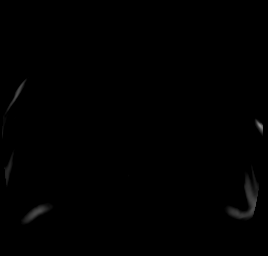

[Series 4: DWI · axial · 6.0mm · 1.49mm/px · 1 of 40 slices shown (2 of 2)]
[im 1/40]
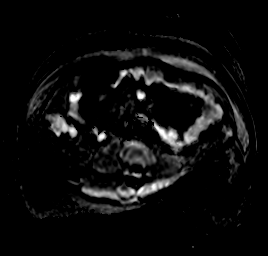

[Series 5: T2 fat-sat · axial · 6.0mm · 1.25mm/px · 1 of 41 slices shown]
[im 1/41]
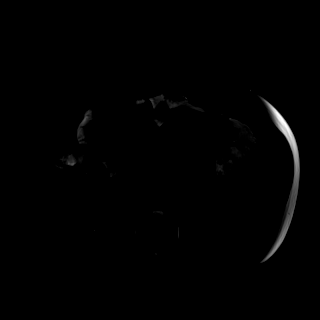

[Series 8: cor_3d_spc_trig · coronal · 1.0mm · 0.49mm/px · 3 of 80 slices shown]
[im 1/80]
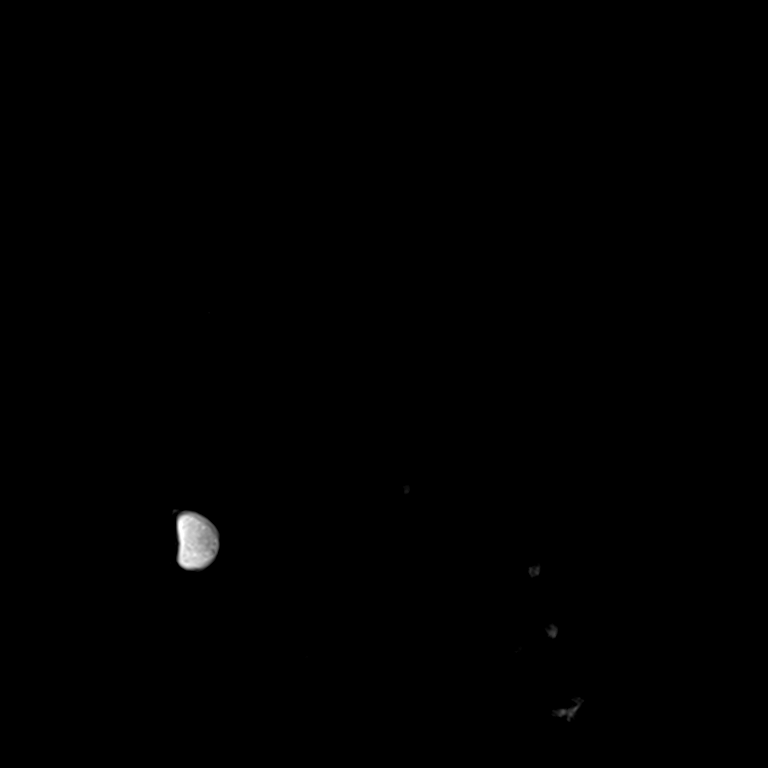
[im 40/80]
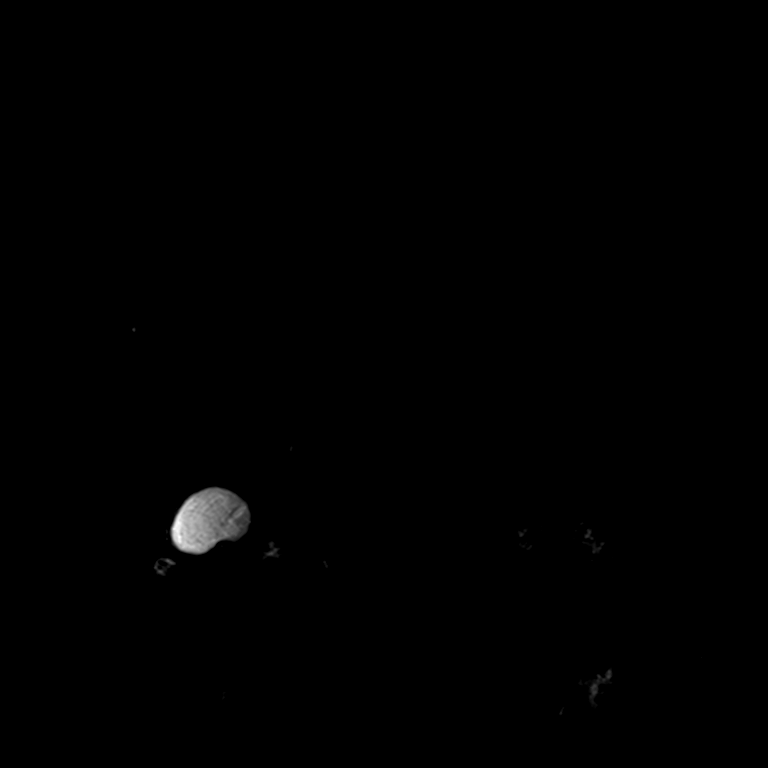
[im 80/80]
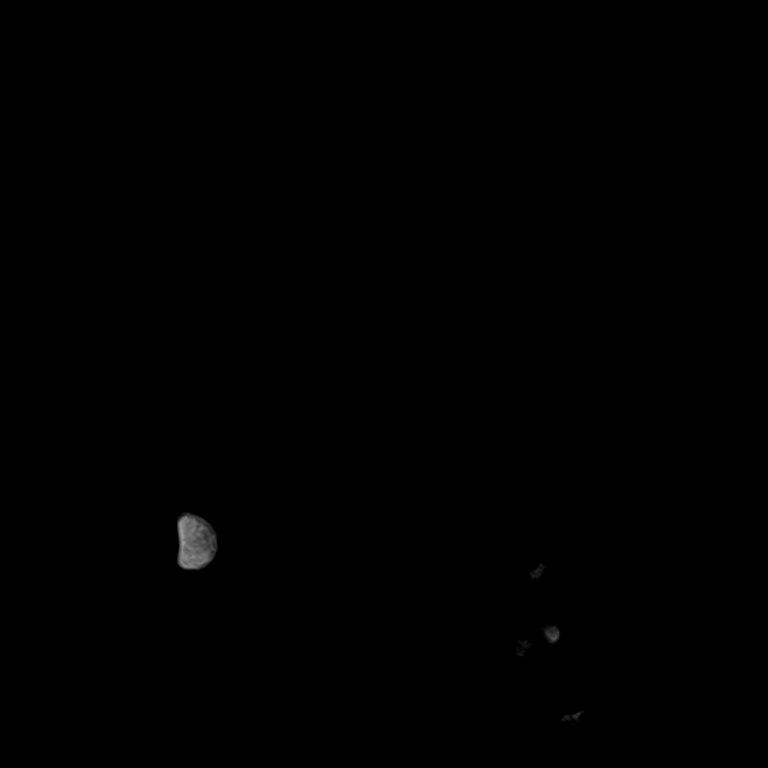

[Series 12: T2 · coronal · 6.0mm · 1.48mm/px · 1 of 36 slices shown (1 of 2)]
[im 1/36]
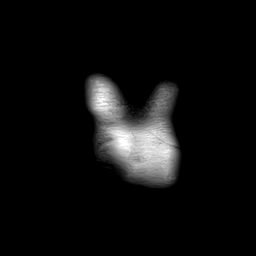

[Series 13: T1 · axial · 3.0mm · 1.25mm/px · z∈[-177,+84]mm · 3 of 88 slices shown (1 of 2)]
[im 1/88]
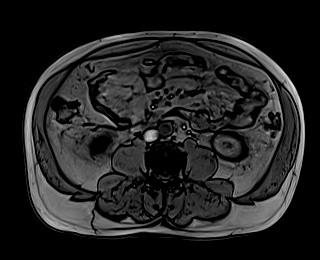
[im 44/88]
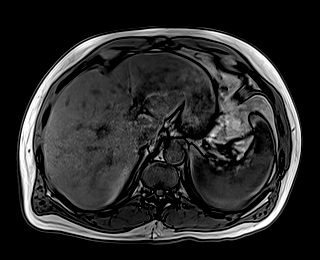
[im 88/88]
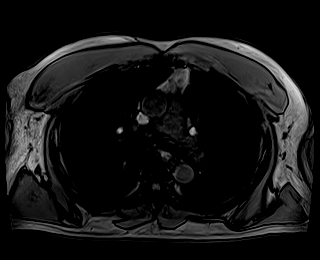

[Series 14: T1 · axial · 3.0mm · 1.25mm/px · z∈[-177,+84]mm · 3 of 88 slices shown (2 of 2)]
[im 1/88]
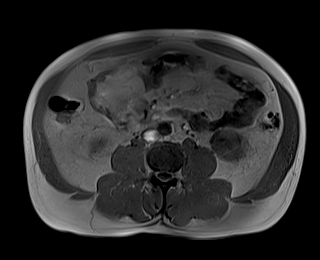
[im 44/88]
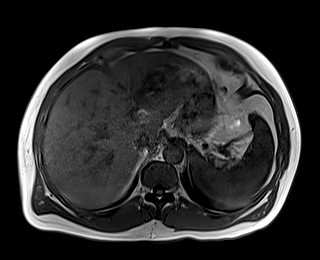
[im 88/88]
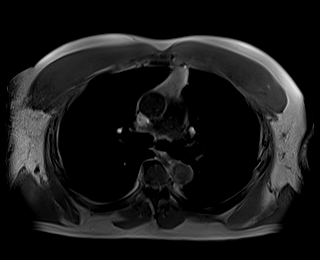

[Series 15: cor obl thk · sagittal · 50.0mm · 0.78mm/px · 1 of 9 slices shown]
[im 1/9]
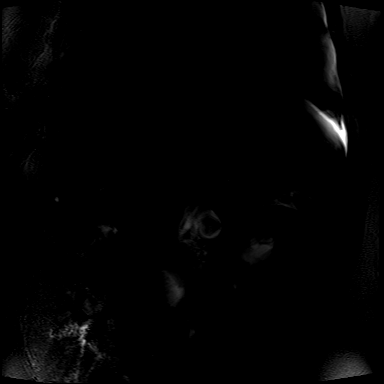

[Series 17: T2 · axial · 6.0mm · 1.56mm/px · 1 of 37 slices shown (2 of 2)]
[im 1/37]
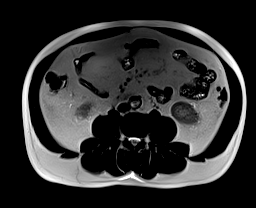

[Series 19: T1 dynamic · axial · 3.0mm · 1.25mm/px · z∈[-177,+84]mm · 3 of 88 slices shown (1 of 6)]
[im 1/88]
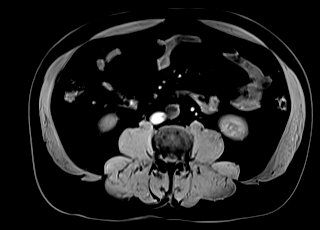
[im 44/88]
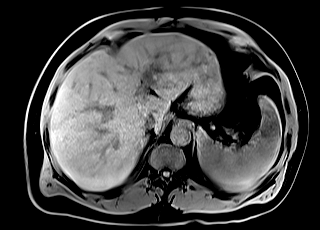
[im 88/88]
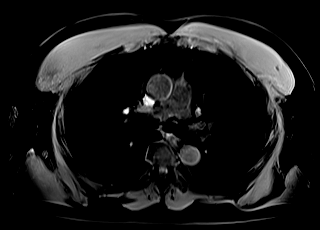

[Series 23: T1 dynamic · axial · 3.0mm · 1.25mm/px · z∈[-177,+84]mm · 3 of 88 slices shown (2 of 6)]
[im 1/88]
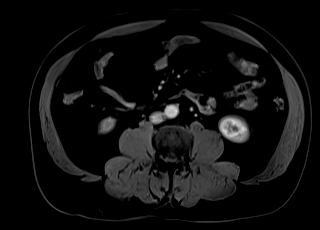
[im 44/88]
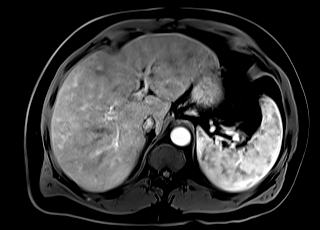
[im 88/88]
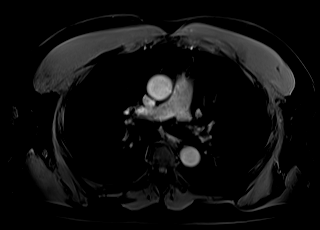

[Series 24: T1 dynamic · axial · 3.0mm · 1.25mm/px · z∈[-177,+84]mm · 3 of 88 slices shown (3 of 6)]
[im 1/88]
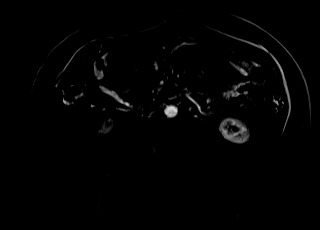
[im 44/88]
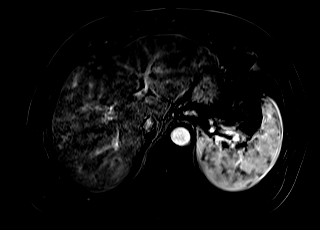
[im 88/88]
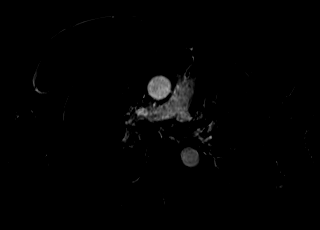

[Series 27: T1 dynamic · axial · 3.0mm · 1.25mm/px · z∈[-177,+84]mm · 3 of 88 slices shown (4 of 6)]
[im 1/88]
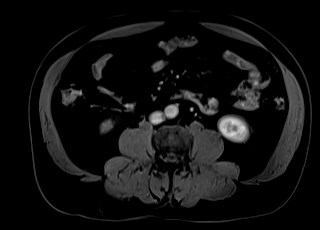
[im 44/88]
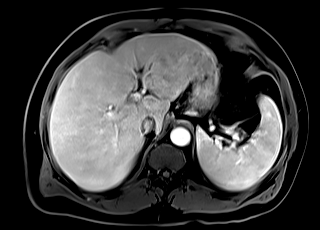
[im 88/88]
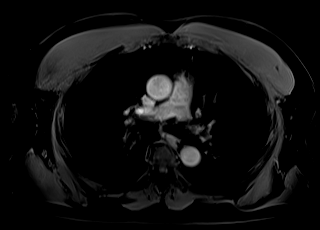

[Series 28: T1 dynamic · axial · 3.0mm · 1.25mm/px · z∈[-177,+84]mm · 3 of 88 slices shown (5 of 6)]
[im 1/88]
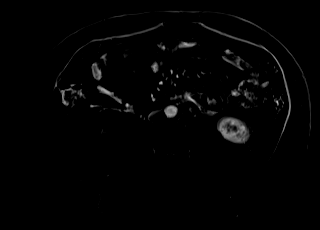
[im 44/88]
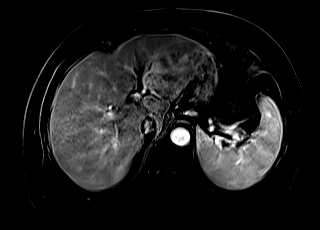
[im 88/88]
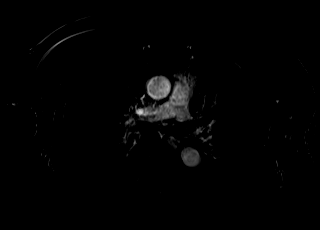

[Series 31: T1 dynamic · axial · 3.0mm · 1.25mm/px · z∈[-177,-48]mm · 2 of 88 slices shown (6 of 6)]
[im 1/88]
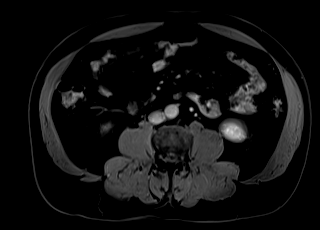
[im 44/88]
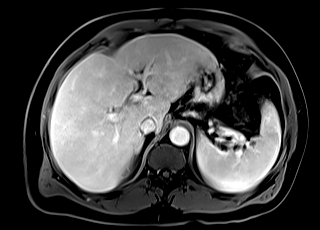

[33 of 48 positions shown; findings below may reference images not displayed]

FINDINGS: Lower chest: Heterogeneous signal in the lung bases, favor
atelectasis.

Hepatobiliary: No hepatic steatosis. Periportal edema. Scattered
tiny hepatic cysts. T2 hyperintense 18 mm segment II hepatic lesion
on image [DATE] which demonstrates nodular arterial enhancement with
filling in of the lesion with contrast on delayed postcontrast
imaging similar in intensity to background aorta, consistent with a
benign hepatic hemangioma.

Two large choledocholithiasis in a nondilated gallbladder without
gallbladder wall thickening or pericholecystic fluid. No biliary
ductal dilation and no choledocholithiasis.

Pancreas: Intrinsic T1 signal of the pancreatic parenchyma is within
normal limits. There is homogeneous enhancement of the pancreas
postcontrast administration. No pancreatic ductal dilation. No
cystic or solid hyperenhancing pancreatic lesions identified.

Spleen:  Within normal limits in size and appearance.

Adrenals/Urinary Tract: Bilateral adrenal glands appear normal. No
hydronephrosis. No solid enhancing renal mass.

Stomach/Bowel: Moderate-sized hiatal hernia. No evidence of acute
bowel inflammation or pathologic dilation of loops of small or large
bowel in the abdomen. Colonic diverticulosis without findings of
acute diverticulitis.

Vascular/Lymphatic: No abdominal aortic aneurysm. The portal,
splenic and superior mesenteric veins are patent. No pathologically
enlarged abdominal lymph nodes.

Other:  No abdominal ascites.

Musculoskeletal: No suspicious bone lesions identified.
IMPRESSION: 1. Two large choledocholithiasis, without findings of acute
cholecystitis, biliary ductal dilation or choledocholithiasis.
2. Periportal edema, nonspecific but in correlation with findings
recent HIDA scan most likely reflect hepatitis.
3. Moderate-sized hiatal hernia.
4. Heterogeneous signal in the lung bases, favor atelectasis.

ADDENDUM:
Dictation error occurred in the findings and impression section of
the report incorrectly states that there are 2 large
choledocholithiasis the report should state:

There are 2 large CHOLELITHIASIS without findings of acute
cholecystitis, biliary ductal dilation or choledocholithiasis.

*** End of Addendum ***
FINDINGS: Lower chest: Heterogeneous signal in the lung bases, favor
atelectasis.

Hepatobiliary: No hepatic steatosis. Periportal edema. Scattered
tiny hepatic cysts. T2 hyperintense 18 mm segment II hepatic lesion
on image [DATE] which demonstrates nodular arterial enhancement with
filling in of the lesion with contrast on delayed postcontrast
imaging similar in intensity to background aorta, consistent with a
benign hepatic hemangioma.

Two large choledocholithiasis in a nondilated gallbladder without
gallbladder wall thickening or pericholecystic fluid. No biliary
ductal dilation and no choledocholithiasis.

Pancreas: Intrinsic T1 signal of the pancreatic parenchyma is within
normal limits. There is homogeneous enhancement of the pancreas
postcontrast administration. No pancreatic ductal dilation. No
cystic or solid hyperenhancing pancreatic lesions identified.

Spleen:  Within normal limits in size and appearance.

Adrenals/Urinary Tract: Bilateral adrenal glands appear normal. No
hydronephrosis. No solid enhancing renal mass.

Stomach/Bowel: Moderate-sized hiatal hernia. No evidence of acute
bowel inflammation or pathologic dilation of loops of small or large
bowel in the abdomen. Colonic diverticulosis without findings of
acute diverticulitis.

Vascular/Lymphatic: No abdominal aortic aneurysm. The portal,
splenic and superior mesenteric veins are patent. No pathologically
enlarged abdominal lymph nodes.

Other:  No abdominal ascites.

Musculoskeletal: No suspicious bone lesions identified.
IMPRESSION: 1. Two large choledocholithiasis, without findings of acute
cholecystitis, biliary ductal dilation or choledocholithiasis.
2. Periportal edema, nonspecific but in correlation with findings
recent HIDA scan most likely reflect hepatitis.
3. Moderate-sized hiatal hernia.
4. Heterogeneous signal in the lung bases, favor atelectasis.

## 2021-03-29 MED ORDER — LIP MEDEX EX OINT
TOPICAL_OINTMENT | CUTANEOUS | Status: DC | PRN
  Filled 2021-03-29: qty 7

## 2021-03-29 MED ORDER — DEXTROSE-NACL 5-0.45 % IV SOLN: INTRAVENOUS | Status: AC

## 2021-03-29 MED ORDER — GADOBUTROL 1 MMOL/ML IV SOLN
9.0000 mL | Freq: Once | INTRAVENOUS | Status: AC | PRN
  Administered 2021-03-29: 9 mL via INTRAVENOUS

## 2021-03-29 MED ORDER — SODIUM CHLORIDE 0.9 % IV SOLN
2.0000 g | INTRAVENOUS | Status: DC
  Administered 2021-03-29: 2 g via INTRAVENOUS
  Filled 2021-03-29 (×2): qty 20

## 2021-03-29 MED ORDER — LACTATED RINGERS IV SOLN: INTRAVENOUS | Status: DC

## 2021-03-29 NOTE — TOC Progression Note (Signed)
Transition of Care [facility]) - Progression Note    Patient Details  Name: Juan Snow MRN: [id number] Date of Birth: October 19, 1955  Transition of Care [facility]) CM/SW Contact  Purcell Mouton, RN Phone Number: 03/29/2021, 2:47 PM  Clinical Narrative:    There are no need at present time.   Expected Discharge Plan: Home/Self Care Barriers to Discharge: No Barriers Identified  Expected Discharge Plan and Services Expected Discharge Plan: Home/Self Care       Living arrangements for the past 2 months: Single Family Home                                       Social Determinants of Health (SDOH) Interventions    Readmission Risk Interventions No flowsheet data found.

## 2021-03-29 NOTE — Progress Notes (Signed)
Patient was seen by Dr. Collene Mares this evening. Per Dr. Collene Mares RN to place clear diet for patient after MRCP is completed. GI plan of care pending MRCP results. Patient and family aware and part of the conversation with MD. All questions answered. Will endorse to oncoming shift.

## 2021-03-29 NOTE — Progress Notes (Signed)
[facility] PROGRESS NOTE    Progress Note  Juan Snow  EZM:[id number] DOB: May 06, 1955 DOA: 03/27/2021 PCP: Vernie Shanks, MD     Brief Narrative:   Juan Snow is an 66 y.o. male past medical history significant for hyperlipidemia and GERD comes in with abdominal pain fever and chills admitted for sepsis started empirically on antibiotics   Significant studies: Abdominal ultrasound showed probable stone in the neck of the gallbladder  Antibiotics: None  Microbiology data: Blood culture:  Procedures: None  Assessment/Plan:   Severe sepsis (HCC)/E. coli bacteremia: T-max of 100.4, blood cultures BC ID grew E. coli. Discontinue Zosyn start IV Rocephin. HIDA scan showed poor excretion from liver suggestive of hepatic dysfunction. GI was consulted recommended MRCP which is pending. She surgery's assistance.  AKI (acute kidney injury) ([place]) With a baseline creatinine of 1, this morning is 1.5 we will continue fluid resuscitation. Recheck a basic metabolic panel tomorrow morning.  Hypokalemia/hypomagnesemia Both have improved.  Continue to monitor intermittently.  Prolonged QT interval Likely due to electrolyte imbalance replete.  Gall bladder stones LFTs elevated including bilirubin alkaline phosphatase within normal. Surgery recommended MRCP.  Aspiration pneumonia ([place]) Unlikely, he denies any cough or shortness of breath, chest x-ray showed scarring.   DVT prophylaxis: lovenox Family Communication:wife Status is: Observation  The patient will require care spanning > 2 midnights and should be moved to inpatient because: Severe sepsis of unclear source    Code Status:     Code Status Orders  (From admission, onward)           Start     Ordered   03/27/21 2129  Full code  Continuous        03/27/21 2128           Code Status History     Date Active Date Inactive Code Status Order ID Comments User Context   06/25/2017 1649  06/26/2017 1820 Full Code [id number]  Gaynelle Arabian, MD Inpatient         IV Access:   Peripheral IV   Procedures and diagnostic studies:   NM Hepatobiliary Liver Func  Result Date: 03/28/2021 CLINICAL DATA:  Elevated bilirubin. EXAM: NUCLEAR MEDICINE HEPATOBILIARY IMAGING TECHNIQUE: Sequential images of the abdomen were obtained out to 60 minutes following intravenous administration of radiopharmaceutical. RADIOPHARMACEUTICALS:  7.6 mCi Tc-40m  Choletec IV COMPARISON:  Ultrasound 03/27/2021 FINDINGS: Uniform uptake of radiotracer with liver. Delayed clearance of radiotracer from the blood pool consistent with elevated bilirubin. There is minimal excretion of radiotracer from the liver. Faint counts are noted in the common bile duct and proximal small at 60 minutes IV morphine was administered to augment filling of the gallbladder. Gallbladder does not fill after morphine administration; however, there is little radiotracer activity excreted into the bowel. The majority of radiotracer remains liver after 90 minutes of imaging. IMPRESSION: 1. Poor excretion of radiotracer from the liver suggest hepatic dysfunction such as hepatitis. 2. Counts are evident, albeit faintly, within the common bile duct and bowel consistent with patent common bile duct. 3. Gallbladder fails to fill after morphine augmentation; however, the sensitivity exam is lessened due to the poor of excretion radiotracer from the liver Electronically Signed   By: Suzy Bouchard M.D.   On: 03/28/2021 13:07   DG Chest Port 1 View  Result Date: 03/27/2021 CLINICAL DATA:  Epigastric pain, vomiting EXAM: PORTABLE CHEST 1 VIEW COMPARISON:  None. FINDINGS: Cardiac size is within normal limits. There are no signs of pulmonary  edema. There are linear densities in the right lower lung fields. There is no pleural effusion or pneumothorax. IMPRESSION: Linear densities in right lower lung fields suggest scarring or subsegmental  atelectasis/pneumonia. Electronically Signed   By: Elmer Picker M.D.   On: 03/27/2021 17:43   US Abdomen Limited RUQ (LIVER/GB)  Result Date: 03/27/2021 CLINICAL DATA:  Abdominal pain EXAM: ULTRASOUND ABDOMEN LIMITED RIGHT UPPER QUADRANT COMPARISON:  None. FINDINGS: Gallbladder: There is 2 cm echogenic focus with acoustic shadowing in the neck of the gallbladder. Sludge is seen in the lumen of the gallbladder. Technologist did not observe any tenderness over the gallbladder. There is no fluid around the gallbladder. Common bile duct: Diameter: 2.3 mm Liver: No focal lesion identified. Within normal limits in parenchymal echogenicity. Portal vein is patent on color Doppler imaging with normal direction of blood flow towards the liver. Other: None. IMPRESSION: There is 2 cm hyperechoic focus with acoustic shadowing in the neck of the gallbladder suggesting gallbladder stone. Sludge is seen in the lumen of the gallbladder. There are no sonographic signs of acute cholecystitis. There is no dilation of bile ducts. Electronically Signed   By: Elmer Picker M.D.   On: 03/27/2021 18:05     Medical Consultants:   None.   Subjective:    Juan Snow relates his nausea has resolved abdominal pain improved he has an appetite.  Objective:    Vitals:   03/28/21 1735 03/28/21 2300 03/29/21 0400 03/29/21 0600  BP: 125/79 110/73  112/71  Pulse: 77 64  60  Resp: 20 20  20   Temp: (!) 100.4 F (38 C) 99.3 F (37.4 C) 100.1 F (37.8 C) 99 F (37.2 C)  TempSrc: Oral Oral Oral Oral  SpO2: 93%   96%  Weight:      Height:       SpO2: 96 %   Intake/Output Summary (Last 24 hours) at 03/29/2021 1225 Last data filed at 03/29/2021 9476 Gross per 24 hour  Intake 746.88 ml  Output 1550 ml  Net -803.12 ml    Filed Weights   03/27/21 2039 03/27/21 2057  Weight: 85.3 kg 85.3 kg    Exam: General exam: In no acute distress. Respiratory system: Good air movement and clear to  auscultation. Cardiovascular system: S1 & S2 heard, RRR. No JVD. Gastrointestinal system: Abdomen is nondistended, soft and nontender.  Extremities: No pedal edema. Skin: No rashes, lesions or ulcers Psychiatry: Judgement and insight appear normal. Mood & affect appropriate.   Data Reviewed:    Labs: Basic Metabolic Panel: Recent Labs  Lab 03/27/21 1658 03/27/21 2311 03/28/21 0405 03/28/21 1243 03/29/21 0358  NA 133*  --  141 137 139  K 3.9  --  3.0* 4.0 4.0  CL 101  --  118* 107 108  CO2 20*  --  18* 22 24  GLUCOSE 155*  --  87 117* 107*  BUN 20  --  15 20 22   CREATININE 1.40*  --  1.03 1.41* 1.52*  CALCIUM 9.2  --  5.8* 8.5* 8.6*  MG 1.7  --  1.4*  --  2.3  PHOS  --  4.2 2.7  --   --     GFR Estimated Creatinine Clearance: 52.2 mL/min (A) (by C-G formula based on SCr of 1.52 mg/dL (H)). Liver Function Tests: Recent Labs  Lab 03/27/21 1658 03/27/21 2311 03/28/21 0405 03/28/21 1243 03/29/21 0358  AST 39  --  24 35 27  ALT 77*  --  45*  67* 56*  ALKPHOS 103  --  58 91 92  BILITOT 4.6* 5.4* 3.9* 5.1* 2.7*  PROT 7.1  --  3.9* 6.0* 6.0*  ALBUMIN 4.0  --  2.0* 3.2* 3.2*    Recent Labs  Lab 03/27/21 1658  LIPASE 24    No results for input(s): AMMONIA in the last 168 hours. Coagulation profile No results for input(s): INR, PROTIME in the last 168 hours. COVID-19 Labs  Recent Labs    03/27/21 2311  DDIMER 1.05*     Lab Results  Component Value Date   SARSCOV2NAA NEGATIVE 03/27/2021    CBC: Recent Labs  Lab 03/27/21 1658 03/28/21 0405 03/28/21 1350 03/29/21 0358  WBC 13.9* 10.1 7.8 5.5  NEUTROABS 13.4* 9.1*  --   --   HGB 17.2* 14.8 14.4 14.4  HCT 49.8 44.2 43.6 42.9  MCV 90.1 92.3 93.8 91.5  PLT 117* 108* 112* 134*    Cardiac Enzymes: Recent Labs  Lab 03/27/21 2311  CKTOTAL 93    BNP (last 3 results) No results for input(s): PROBNP in the last 8760 hours. CBG: No results for input(s): GLUCAP in the last 168  hours. D-Dimer: Recent Labs    03/27/21 2311  DDIMER 1.05*    Hgb A1c: No results for input(s): HGBA1C in the last 72 hours. Lipid Profile: No results for input(s): CHOL, HDL, LDLCALC, TRIG, CHOLHDL, LDLDIRECT in the last 72 hours. Thyroid function studies: Recent Labs    03/27/21 2311  TSH 0.785    Anemia work up: No results for input(s): VITAMINB12, FOLATE, FERRITIN, TIBC, IRON, RETICCTPCT in the last 72 hours. Sepsis Labs: Recent Labs  Lab 03/27/21 1658 03/27/21 2027 03/27/21 2311 03/28/21 0015 03/28/21 0405 03/28/21 1350 03/29/21 0358  PROCALCITON  --   --  34.45  --   --   --   --   WBC 13.9*  --   --   --  10.1 7.8 5.5  LATICACIDVEN 2.2* 1.6 1.6 1.5  --   --   --     Microbiology Recent Results (from the past 240 hour(s))  Culture, blood (routine x 2)     Status: Abnormal (Preliminary result)   Collection Time: 03/27/21  4:56 PM   Specimen: Right Antecubital; Blood  Result Value Ref Range Status   Specimen Description   Final    RIGHT ANTECUBITAL Performed at [facility], [place] [address] [place], [place] [address]    Special Requests   Final    BOTTLES DRAWN AEROBIC AND ANAEROBIC Blood Culture adequate volume Performed at [place] [address] [place], [place] [address]    Culture  Setup Time   Final    GRAM NEGATIVE RODS IN BOTH AEROBIC AND ANAEROBIC BOTTLES Organism ID to follow CRITICAL RESULT CALLED TO, READ BACK BY AND VERIFIED WITH: Sanda Klein, AT 3295 03/28/21 D. VANHOOK    Culture (A)  Final    ESCHERICHIA COLI SUSCEPTIBILITIES TO FOLLOW Performed at [facility], [place] [address] [place], [place] [address]    Report Status PENDING  Incomplete  Blood Culture ID Panel (Reflexed)     Status: Abnormal   Collection Time: 03/27/21  4:56 PM  Result Value Ref Range Status   Enterococcus faecalis NOT DETECTED NOT DETECTED Final   Enterococcus Faecium NOT DETECTED NOT DETECTED Final    Listeria monocytogenes NOT DETECTED NOT DETECTED Final   Staphylococcus species NOT DETECTED NOT DETECTED Final   Staphylococcus aureus (BCID)  NOT DETECTED NOT DETECTED Final   Staphylococcus epidermidis NOT DETECTED NOT DETECTED Final   Staphylococcus lugdunensis NOT DETECTED NOT DETECTED Final   Streptococcus species NOT DETECTED NOT DETECTED Final   Streptococcus agalactiae NOT DETECTED NOT DETECTED Final   Streptococcus pneumoniae NOT DETECTED NOT DETECTED Final   Streptococcus pyogenes NOT DETECTED NOT DETECTED Final   A.calcoaceticus-baumannii NOT DETECTED NOT DETECTED Final   Bacteroides fragilis NOT DETECTED NOT DETECTED Final   Enterobacterales DETECTED (A) NOT DETECTED Final    Comment: Enterobacterales represent a large order of gram negative bacteria, not a single organism. CRITICAL RESULT CALLED TO, READ BACK BY AND VERIFIED WITH: Sanda Klein, AT [phone] 03/28/21 D. VANHOOK    Enterobacter cloacae complex NOT DETECTED NOT DETECTED Final   Escherichia coli DETECTED (A) NOT DETECTED Final    Comment: CRITICAL RESULT CALLED TO, READ BACK BY AND VERIFIED WITH: Sanda Klein, AT [phone] 03/28/21 D. VANHOOK    Klebsiella aerogenes NOT DETECTED NOT DETECTED Final   Klebsiella oxytoca NOT DETECTED NOT DETECTED Final   Klebsiella pneumoniae NOT DETECTED NOT DETECTED Final   Proteus species NOT DETECTED NOT DETECTED Final   Salmonella species NOT DETECTED NOT DETECTED Final   Serratia marcescens NOT DETECTED NOT DETECTED Final   Haemophilus influenzae NOT DETECTED NOT DETECTED Final   Neisseria meningitidis NOT DETECTED NOT DETECTED Final   Pseudomonas aeruginosa NOT DETECTED NOT DETECTED Final   Stenotrophomonas maltophilia NOT DETECTED NOT DETECTED Final   Candida albicans NOT DETECTED NOT DETECTED Final   Candida auris NOT DETECTED NOT DETECTED Final   Candida glabrata NOT DETECTED NOT DETECTED Final   Candida krusei NOT DETECTED NOT DETECTED Final   Candida parapsilosis NOT  DETECTED NOT DETECTED Final   Candida tropicalis NOT DETECTED NOT DETECTED Final   Cryptococcus neoformans/gattii NOT DETECTED NOT DETECTED Final   CTX-M ESBL NOT DETECTED NOT DETECTED Final   Carbapenem resistance IMP NOT DETECTED NOT DETECTED Final   Carbapenem resistance KPC NOT DETECTED NOT DETECTED Final   Carbapenem resistance NDM NOT DETECTED NOT DETECTED Final   Carbapenem resist OXA 48 LIKE NOT DETECTED NOT DETECTED Final   Carbapenem resistance VIM NOT DETECTED NOT DETECTED Final    Comment: Performed at [facility], [address] [place], [place] [address]  Culture, blood (routine x 2)     Status: Abnormal (Preliminary result)   Collection Time: 03/27/21  5:15 PM   Specimen: BLOOD  Result Value Ref Range Status   Specimen Description   Final    BLOOD LEFT ANTECUBITAL Performed at [facility] [address] [place], [place] [address]    Special Requests   Final    BOTTLES DRAWN AEROBIC AND ANAEROBIC Blood Culture adequate volume Performed at [place] [address] [place], [place] [address]    Culture  Setup Time   Final    GRAM NEGATIVE RODS IN BOTH AEROBIC AND ANAEROBIC BOTTLES CRITICAL VALUE NOTED.  VALUE IS CONSISTENT WITH PREVIOUSLY REPORTED AND CALLED VALUE. Performed at [facility], [place] [address], [place], [place] [address]    Culture ESCHERICHIA COLI (A)  Final   Report Status PENDING  Incomplete  Resp Panel by RT-PCR (Flu A&B, Covid) Nasopharyngeal Swab     Status: None   Collection Time: 03/27/21  5:58 PM   Specimen: Nasopharyngeal Swab; Nasopharyngeal(NP) swabs in vial transport medium  Result Value Ref Range Status   SARS Coronavirus 2 by RT PCR NEGATIVE NEGATIVE  Final    Comment: (NOTE) SARS-CoV-2 target nucleic acids are NOT DETECTED.  The SARS-CoV-2 RNA is generally detectable in upper respiratory specimens during the acute phase of infection. The lowest concentration of SARS-CoV-2 viral  copies this assay can detect is 138 copies/mL. A negative result does not preclude SARS-Cov-2 infection and should not be used as the sole basis for treatment or other patient management decisions. A negative result may occur with  improper specimen collection/handling, submission of specimen other than nasopharyngeal swab, presence of viral mutation(s) within the areas targeted by this assay, and inadequate number of viral copies(<138 copies/mL). A negative result must be combined with clinical observations, patient history, and epidemiological information. The expected result is Negative.  Fact Sheet for Patients:  EntrepreneurPulse.com.au  Fact Sheet for Healthcare Providers:  IncredibleEmployment.be  This test is no t yet approved or cleared by the [place] FDA and  has been authorized for detection and/or diagnosis of SARS-CoV-2 by FDA under an Emergency Use Authorization (EUA). This EUA will remain  in effect (meaning this test can be used) for the duration of the COVID-19 declaration under Section 564(b)(1) of the Act, 21 U.S.C.section 360bbb-3(b)(1), unless the authorization is terminated  or revoked sooner.       Influenza A by PCR NEGATIVE NEGATIVE Final   Influenza B by PCR NEGATIVE NEGATIVE Final    Comment: (NOTE) The Xpert Xpress SARS-CoV-2/FLU/RSV plus assay is intended as an aid in the diagnosis of influenza from Nasopharyngeal swab specimens and should not be used as a sole basis for treatment. Nasal washings and aspirates are unacceptable for Xpert Xpress SARS-CoV-2/FLU/RSV testing.  Fact Sheet for Patients: EntrepreneurPulse.com.au  Fact Sheet for Healthcare Providers: IncredibleEmployment.be  This test is not yet approved or cleared by the [place] FDA and has been authorized for detection and/or diagnosis of SARS-CoV-2 by FDA under an Emergency Use Authorization (EUA). This  EUA will remain in effect (meaning this test can be used) for the duration of the COVID-19 declaration under Section 564(b)(1) of the Act, 21 U.S.C. section 360bbb-3(b)(1), unless the authorization is terminated or revoked.  Performed at [facility], [place] [address] [place], [place] [address]      Medications:    atorvastatin  10 mg Oral Daily    morphine injection  3 mg Intravenous Once   pantoprazole (PROTONIX) IV  40 mg Intravenous Q12H   Continuous Infusions:  cefTRIAXone (ROCEPHIN)  IV 2 g (03/29/21 1124)   lactated ringers 75 mL/hr at 03/29/21 1103      LOS: 1 day   [name]  [facility]  03/29/2021, 12:25 PM       ``

## 2021-03-29 NOTE — Progress Notes (Addendum)
Subjective: Since I last evaluated the patient there has not been much change in his overall condition.  He denies having any abdominal pain.  He has been waiting for his MRCP all day.  It is scheduled at 7 PM tonight.  He has had a BM today.  There is no blood or mucus in the stool.  Objective: Vital signs in last 24 hours: Temp:  [99 F (37.2 C)-100.4 F (38 C)] 99 F (37.2 C) (01/26 0600) Pulse Rate:  [60-77] 60 (01/26 0600) Resp:  [18-27] 20 (01/26 0600) BP: (110-126)/(71-79) 112/71 (01/26 0600) SpO2:  [93 %-96 %] 96 % (01/26 0600) Last BM Date: 03/26/21  Intake/Output from previous day: 01/25 0701 - 01/26 0700 In: 1847.5 [I.V.:1647; IV Piggyback:200.5] Out: 1550 [Urine:1550] Intake/Output this shift: No intake/output data recorded.  General appearance: alert, cooperative, appears stated age, and no distress Resp: clear to auscultation bilaterally Cardio: regular rate and rhythm, S1, S2 normal, no murmur, click, rub or gallop GI: soft, non-tender; bowel sounds normal; no masses,  no organomegaly  Lab Results: Recent Labs    03/28/21 0405 03/28/21 1350 03/29/21 0358  WBC 10.1 7.8 5.5  HGB 14.8 14.4 14.4  HCT 44.2 43.6 42.9  PLT 108* 112* 134*   BMET Recent Labs    03/28/21 0405 03/28/21 1243 03/29/21 0358  NA 141 137 139  K 3.0* 4.0 4.0  CL 118* 107 108  CO2 18* 22 24  GLUCOSE 87 117* 107*  BUN 15 20 22   CREATININE 1.03 1.41* 1.52*  CALCIUM 5.8* 8.5* 8.6*   LFT Recent Labs    03/27/21 2311 03/28/21 0405 03/29/21 0358  PROT  --    < > 6.0*  ALBUMIN  --    < > 3.2*  AST  --    < > 27  ALT  --    < > 56*  ALKPHOS  --    < > 92  BILITOT 5.4*   < > 2.7*  BILIDIR 3.1*  --   --   IBILI 2.3*  --   --    < > = values in this interval not displayed.   Studies/Results: NM Hepatobiliary Liver Func  Result Date: 03/28/2021 CLINICAL DATA:  Elevated bilirubin. EXAM: NUCLEAR MEDICINE HEPATOBILIARY IMAGING TECHNIQUE: Sequential images of the abdomen were  obtained out to 60 minutes following intravenous administration of radiopharmaceutical. RADIOPHARMACEUTICALS:  7.6 mCi Tc-18m  Choletec IV COMPARISON:  Ultrasound 03/27/2021 FINDINGS: Uniform uptake of radiotracer with liver. Delayed clearance of radiotracer from the blood pool consistent with elevated bilirubin. There is minimal excretion of radiotracer from the liver. Faint counts are noted in the common bile duct and proximal small at 60 minutes IV morphine was administered to augment filling of the gallbladder. Gallbladder does not fill after morphine administration; however, there is little radiotracer activity excreted into the bowel. The majority of radiotracer remains liver after 90 minutes of imaging. IMPRESSION: 1. Poor excretion of radiotracer from the liver suggest hepatic dysfunction such as hepatitis. 2. Counts are evident, albeit faintly, within the common bile duct and bowel consistent with patent common bile duct. 3. Gallbladder fails to fill after morphine augmentation; however, the sensitivity exam is lessened due to the poor of excretion radiotracer from the liver Electronically Signed   By: Suzy Bouchard M.D.   On: 03/28/2021 13:07   DG Chest Port 1 View  Result Date: 03/27/2021 CLINICAL DATA:  Epigastric pain, vomiting EXAM: PORTABLE CHEST 1 VIEW COMPARISON:  None. FINDINGS: Cardiac size  is within normal limits. There are no signs of pulmonary edema. There are linear densities in the right lower lung fields. There is no pleural effusion or pneumothorax. IMPRESSION: Linear densities in right lower lung fields suggest scarring or subsegmental atelectasis/pneumonia. Electronically Signed   By: Elmer Picker M.D.   On: 03/27/2021 17:43   US Abdomen Limited RUQ (LIVER/GB)  Result Date: 03/27/2021 CLINICAL DATA:  Abdominal pain EXAM: ULTRASOUND ABDOMEN LIMITED RIGHT UPPER QUADRANT COMPARISON:  None. FINDINGS: Gallbladder: There is 2 cm echogenic focus with acoustic shadowing in the  neck of the gallbladder. Sludge is seen in the lumen of the gallbladder. Technologist did not observe any tenderness over the gallbladder. There is no fluid around the gallbladder. Common bile duct: Diameter: 2.3 mm Liver: No focal lesion identified. Within normal limits in parenchymal echogenicity. Portal vein is patent on color Doppler imaging with normal direction of blood flow towards the liver. Other: None. IMPRESSION: There is 2 cm hyperechoic focus with acoustic shadowing in the neck of the gallbladder suggesting gallbladder stone. Sludge is seen in the lumen of the gallbladder. There are no sonographic signs of acute cholecystitis. There is no dilation of bile ducts. Electronically Signed   By: Elmer Picker M.D.   On: 03/27/2021 18:05    Medications: I have reviewed the patient's current medications. Prior to Admission:  Medications Prior to Admission  Medication Sig Dispense Refill Last Dose   acetaminophen (TYLENOL) 500 MG tablet Take 500 mg by mouth every 6 (six) hours as needed (for pain).   03/25/2021 at pm   atorvastatin (LIPITOR) 10 MG tablet Take 1 tablet (10 mg total) by mouth daily. 90 tablet 3 03/25/2021   Cholecalciferol (VITAMIN D3) 50 MCG (2000 UT) TABS Take 8,000 Units by mouth daily.   03/24/2021   Coenzyme Q10 (CO Q-10 PO) Take 1 capsule by mouth daily.   03/24/2021   esomeprazole (NEXIUM) 20 MG capsule Take 20 mg by mouth daily before breakfast.   Past Week   ibuprofen (ADVIL) 200 MG tablet Take 600 mg by mouth every 6 (six) hours as needed (for pain).   03/25/2021 at pm   Menaquinone-7 (VITAMIN K2 PO) Take 1 tablet by mouth daily.   03/24/2021   vitamin C (ASCORBIC ACID) 500 MG tablet Take 500-1,000 mg by mouth daily.   Past Week   atorvastatin (LIPITOR) 10 MG tablet TAKE 1 TABLET BY MOUTH ONCE A DAY (Patient not taking: Reported on 03/27/2021) 90 tablet 1 Not Taking at am   atorvastatin (LIPITOR) 10 MG tablet Take 1 tablet by mouth once a day (Patient not taking: Reported  on 03/27/2021) 90 tablet 0 Not Taking   atorvastatin (LIPITOR) 10 MG tablet Take 1 tablet (10 mg total) by mouth daily. (Patient not taking: Reported on 03/27/2021) 90 tablet 0 Not Taking   HYDROcodone-acetaminophen (NORCO) 7.5-325 MG tablet Take 1 tablet by mouth every 4 (four) hours as needed for severe pain (pain score 7-10). (Patient not taking: Reported on 03/27/2021) 40 tablet 0 Not Taking   methocarbamol (ROBAXIN) 500 MG tablet Take 1 tablet (500 mg total) by mouth every 6 (six) hours as needed for muscle spasms. (Patient not taking: Reported on 03/27/2021) 40 tablet 0 Not Taking   rivaroxaban (XARELTO) 10 MG TABS tablet Take 1 tablet (10 mg total) by mouth daily with breakfast. (Patient not taking: Reported on 03/27/2021) 20 tablet 0 Not Taking   traMADol (ULTRAM) 50 MG tablet Take 1-2 tablets (50-100 mg total) by mouth every 6 (  six) hours as needed for moderate pain. (Patient not taking: Reported on 03/27/2021) 40 tablet 0 Not Taking   Scheduled:  atorvastatin  10 mg Oral Daily    morphine injection  3 mg Intravenous Once   pantoprazole (PROTONIX) IV  40 mg Intravenous Q12H   Continuous:  cefTRIAXone (ROCEPHIN)  IV 2 g (03/29/21 1124)   dextrose 5 % and 0.45% NaCl 75 mL/hr at 03/29/21 1549   lactated ringers 75 mL/hr at 03/29/21 1103    Assessment/Plan: ) Severe sepsis with E. coli on Zosyn; patient will need to have his colonoscopy as soon as possible once his acute symptoms resolve.  2) Epigastric pain with nausea and vomiting on admission but symptom free today TB 2.7; ALT 56, AST 27 and alkaline phosphatase of 92; cholelithiasis with a 2 cm gallstone in the neck of the gallbladder and no evidence of CBD obstruction noted on HIDA scan. MRCP planned tonight. An EGD will be planned as well  3) Acute kidney injury has improved after resuscitation with fluids.  Creatinine has improved from 1.4-1.03. 4) Hypokalemia/hypomagnesemia-resolved. 5) Prolonged QT interval probably secondary to  electrolyte disturbances 6) Hyperlipidemia on Atorvastatin at home. 7) Pandiverticulosis. Mandie Crabbe  LOS: 1 day   [name] 03/29/2021, 12:52 PM

## 2021-03-29 NOTE — Progress Notes (Addendum)
Patient ID: Juan Snow, male   DOB: 05-Sep-1955, 66 y.o.   MRN: [id number] [facility] Surgery Progress Note     Subjective: CC-  Feels well this morning. States that he may have a mild dull ache in the epigastric region, but no longer having abdominal pain. Denies n/v. WBC normalized, TMAX 100.4 last night. LFTs trending down.  Objective: Vital signs in last 24 hours: Temp:  [99 F (37.2 C)-100.4 F (38 C)] 99 F (37.2 C) (01/26 0600) Pulse Rate:  [60-77] 60 (01/26 0600) Resp:  [18-27] 20 (01/26 0600) BP: (110-126)/(65-79) 112/71 (01/26 0600) SpO2:  [93 %-96 %] 96 % (01/26 0600)    Intake/Output from previous day: 01/25 0701 - 01/26 0700 In: 1847.5 [I.V.:1647; IV Piggyback:200.5] Out: 1550 [Urine:1550] Intake/Output this shift: No intake/output data recorded.  PE: Abd: soft, ND, NT  Lab Results:  Recent Labs    03/28/21 1350 03/29/21 0358  WBC 7.8 5.5  HGB 14.4 14.4  HCT 43.6 42.9  PLT 112* 134*   BMET Recent Labs    03/28/21 1243 03/29/21 0358  NA 137 139  K 4.0 4.0  CL 107 108  CO2 22 24  GLUCOSE 117* 107*  BUN 20 22  CREATININE 1.41* 1.52*  CALCIUM 8.5* 8.6*   PT/INR No results for input(s): LABPROT, INR in the last 72 hours. CMP     Component Value Date/Time   NA 139 03/29/2021 0358   K 4.0 03/29/2021 0358   CL 108 03/29/2021 0358   CO2 24 03/29/2021 0358   GLUCOSE 107 (H) 03/29/2021 0358   BUN 22 03/29/2021 0358   CREATININE 1.52 (H) 03/29/2021 0358   CALCIUM 8.6 (L) 03/29/2021 0358   PROT 6.0 (L) 03/29/2021 0358   ALBUMIN 3.2 (L) 03/29/2021 0358   AST 27 03/29/2021 0358   ALT 56 (H) 03/29/2021 0358   ALKPHOS 92 03/29/2021 0358   BILITOT 2.7 (H) 03/29/2021 0358   GFRNONAA 51 (L) 03/29/2021 0358   GFRAA >60 06/26/2017 0529   Lipase     Component Value Date/Time   LIPASE 24 03/27/2021 1658       Studies/Results: NM Hepatobiliary Liver Func  Result Date: 03/28/2021 CLINICAL DATA:  Elevated bilirubin. EXAM: NUCLEAR  MEDICINE HEPATOBILIARY IMAGING TECHNIQUE: Sequential images of the abdomen were obtained out to 60 minutes following intravenous administration of radiopharmaceutical. RADIOPHARMACEUTICALS:  7.6 mCi Tc-4m  Choletec IV COMPARISON:  Ultrasound 03/27/2021 FINDINGS: Uniform uptake of radiotracer with liver. Delayed clearance of radiotracer from the blood pool consistent with elevated bilirubin. There is minimal excretion of radiotracer from the liver. Faint counts are noted in the common bile duct and proximal small at 60 minutes IV morphine was administered to augment filling of the gallbladder. Gallbladder does not fill after morphine administration; however, there is little radiotracer activity excreted into the bowel. The majority of radiotracer remains liver after 90 minutes of imaging. IMPRESSION: 1. Poor excretion of radiotracer from the liver suggest hepatic dysfunction such as hepatitis. 2. Counts are evident, albeit faintly, within the common bile duct and bowel consistent with patent common bile duct. 3. Gallbladder fails to fill after morphine augmentation; however, the sensitivity exam is lessened due to the poor of excretion radiotracer from the liver Electronically Signed   By: Suzy Bouchard M.D.   On: 03/28/2021 13:07   DG Chest Port 1 View  Result Date: 03/27/2021 CLINICAL DATA:  Epigastric pain, vomiting EXAM: PORTABLE CHEST 1 VIEW COMPARISON:  None. FINDINGS: Cardiac size is within normal limits.  There are no signs of pulmonary edema. There are linear densities in the right lower lung fields. There is no pleural effusion or pneumothorax. IMPRESSION: Linear densities in right lower lung fields suggest scarring or subsegmental atelectasis/pneumonia. Electronically Signed   By: Elmer Picker M.D.   On: 03/27/2021 17:43   US Abdomen Limited RUQ (LIVER/GB)  Result Date: 03/27/2021 CLINICAL DATA:  Abdominal pain EXAM: ULTRASOUND ABDOMEN LIMITED RIGHT UPPER QUADRANT COMPARISON:  None.  FINDINGS: Gallbladder: There is 2 cm echogenic focus with acoustic shadowing in the neck of the gallbladder. Sludge is seen in the lumen of the gallbladder. Technologist did not observe any tenderness over the gallbladder. There is no fluid around the gallbladder. Common bile duct: Diameter: 2.3 mm Liver: No focal lesion identified. Within normal limits in parenchymal echogenicity. Portal vein is patent on color Doppler imaging with normal direction of blood flow towards the liver. Other: None. IMPRESSION: There is 2 cm hyperechoic focus with acoustic shadowing in the neck of the gallbladder suggesting gallbladder stone. Sludge is seen in the lumen of the gallbladder. There are no sonographic signs of acute cholecystitis. There is no dilation of bile ducts. Electronically Signed   By: Elmer Picker M.D.   On: 03/27/2021 18:05    Anti-infectives: Anti-infectives (From admission, onward)    Start     Dose/Rate Route Frequency Ordered Stop   03/29/21 0200  piperacillin-tazobactam (ZOSYN) IVPB 3.375 g        3.375 g 12.5 mL/hr over 240 Minutes Intravenous Every 8 hours 03/28/21 1944     03/28/21 0200  piperacillin-tazobactam (ZOSYN) IVPB 3.375 g  Status:  Discontinued        3.375 g 12.5 mL/hr over 240 Minutes Intravenous Every 8 hours 03/27/21 2050 03/28/21 1944   03/27/21 1815  piperacillin-tazobactam (ZOSYN) IVPB 3.375 g        3.375 g 100 mL/hr over 30 Minutes Intravenous  Once 03/27/21 1806 03/27/21 1843        Assessment/Plan Epigastric abdominal pain Gallstones Hyperbilirubinemia  Bacteremia - E coli and Enterobacterales  - HIDA with evidence of hepatic dysfunction (?hepatitis pattern) but no obstruction - LFTs are trending down today and pain has improved - GI following and recommending MRCP - I have ordered this and it is pending. Hepatitis panel also pending    ID - zosyn 1/24>> VTE - SCDs, ok for chemical dvt ppx from ccs perspective  FEN - IVF, NPO Foley - none    HLD GERD AKI Thrombocytopenia  Straightforward Medical Decision Making   LOS: 1 day    Wellington Hampshire, [facility] Surgery 03/29/2021, 10:31 AM Please see Amion for pager number during day hours 7:00am-4:30pm

## 2021-03-29 NOTE — Progress Notes (Signed)
RN called MRI find out when pt could come down for the scan but tech informed RN there were ten stat orders from ED and will not be able to get to pt. Per RT pt's scan will be done in the am. NP on-call notified and per NP, pt should remain npo per GI order. Pt and spouse at bedside notified and updated. Will continue to closely monitor pt. Delia Heady RN

## 2021-03-30 ENCOUNTER — Inpatient Hospital Stay (HOSPITAL_COMMUNITY): Payer: 59 | Admitting: Anesthesiology

## 2021-03-30 ENCOUNTER — Encounter (HOSPITAL_COMMUNITY)
Admission: EM | Disposition: A | Discharge status: Home / Self Care | Payer: Self-pay | Source: Home / Self Care | Attending: Internal Medicine

## 2021-03-30 ENCOUNTER — Inpatient Hospital Stay (HOSPITAL_COMMUNITY): Payer: 59

## 2021-03-30 ENCOUNTER — Encounter (HOSPITAL_COMMUNITY): Payer: Self-pay | Admitting: Internal Medicine

## 2021-03-30 ENCOUNTER — Other Ambulatory Visit: Payer: Self-pay

## 2021-03-30 HISTORY — PX: CHOLECYSTECTOMY: SHX55

## 2021-03-30 LAB — CBC
HCT: 41.3 % (ref 39.0–52.0)
Hemoglobin: 14.1 g/dL (ref 13.0–17.0)
MCH: 31.3 pg (ref 26.0–34.0)
MCHC: 34.1 g/dL (ref 30.0–36.0)
MCV: 91.6 fL (ref 80.0–100.0)
Platelets: 146 10*3/uL — ABNORMAL LOW (ref 150–400)
RBC: 4.51 MIL/uL (ref 4.22–5.81)
RDW: 13.3 % (ref 11.5–15.5)
WBC: 5.6 10*3/uL (ref 4.0–10.5)
nRBC: 0 % (ref 0.0–0.2)

## 2021-03-30 LAB — CULTURE, BLOOD (ROUTINE X 2)
Special Requests: ADEQUATE
Special Requests: ADEQUATE

## 2021-03-30 LAB — COMPREHENSIVE METABOLIC PANEL
ALT: 38 U/L (ref 0–44)
AST: 14 U/L — ABNORMAL LOW (ref 15–41)
Albumin: 2.9 g/dL — ABNORMAL LOW (ref 3.5–5.0)
Alkaline Phosphatase: 79 U/L (ref 38–126)
Anion gap: 8 (ref 5–15)
BUN: 21 mg/dL (ref 8–23)
CO2: 23 mmol/L (ref 22–32)
Calcium: 8.6 mg/dL — ABNORMAL LOW (ref 8.9–10.3)
Chloride: 108 mmol/L (ref 98–111)
Creatinine, Ser: <0.3 mg/dL — ABNORMAL LOW (ref 0.61–1.24)
Glucose, Bld: 109 mg/dL — ABNORMAL HIGH (ref 70–99)
Potassium: 3.7 mmol/L (ref 3.5–5.1)
Sodium: 139 mmol/L (ref 135–145)
Total Bilirubin: 1.6 mg/dL — ABNORMAL HIGH (ref 0.3–1.2)
Total Protein: 5.9 g/dL — ABNORMAL LOW (ref 6.5–8.1)

## 2021-03-30 SURGERY — LAPAROSCOPIC CHOLECYSTECTOMY WITH INTRAOPERATIVE CHOLANGIOGRAM
Anesthesia: General | Site: Abdomen

## 2021-03-30 MED ORDER — SODIUM CHLORIDE 0.9 % IV SOLN
INTRAVENOUS | Status: AC
  Filled 2021-03-30: qty 20

## 2021-03-30 MED ORDER — FENTANYL CITRATE (PF) 250 MCG/5ML IJ SOLN
INTRAMUSCULAR | Status: DC | PRN
  Administered 2021-03-30: 100 ug via INTRAVENOUS
  Administered 2021-03-30 (×3): 50 ug via INTRAVENOUS

## 2021-03-30 MED ORDER — LIDOCAINE 2% (20 MG/ML) 5 ML SYRINGE
INTRAMUSCULAR | Status: DC | PRN
  Administered 2021-03-30: 100 mg via INTRAVENOUS

## 2021-03-30 MED ORDER — DEXAMETHASONE SODIUM PHOSPHATE 10 MG/ML IJ SOLN
INTRAMUSCULAR | Status: AC
  Filled 2021-03-30: qty 1

## 2021-03-30 MED ORDER — ROCURONIUM BROMIDE 10 MG/ML (PF) SYRINGE
PREFILLED_SYRINGE | INTRAVENOUS | Status: DC | PRN
  Administered 2021-03-30: 70 mg via INTRAVENOUS

## 2021-03-30 MED ORDER — 0.9 % SODIUM CHLORIDE (POUR BTL) OPTIME
TOPICAL | Status: DC | PRN
  Administered 2021-03-30: 1000 mL

## 2021-03-30 MED ORDER — HYDROMORPHONE HCL 1 MG/ML IJ SOLN
INTRAMUSCULAR | Status: AC
  Filled 2021-03-30: qty 2

## 2021-03-30 MED ORDER — ONDANSETRON HCL 4 MG/2ML IJ SOLN: 4.0000 mg | Freq: Once | INTRAMUSCULAR | Status: DC | PRN

## 2021-03-30 MED ORDER — DEXTROSE 5 % IV SOLN
INTRAVENOUS | Status: DC | PRN
  Administered 2021-03-30: 2 g via INTRAVENOUS

## 2021-03-30 MED ORDER — BUPIVACAINE-EPINEPHRINE 0.5% -1:200000 IJ SOLN
INTRAMUSCULAR | Status: DC | PRN
  Administered 2021-03-30: 30 mL

## 2021-03-30 MED ORDER — ONDANSETRON HCL 4 MG/2ML IJ SOLN
INTRAMUSCULAR | Status: AC
  Filled 2021-03-30: qty 2

## 2021-03-30 MED ORDER — SODIUM CHLORIDE 0.9 % IV SOLN
1.5000 g | Freq: Four times a day (QID) | INTRAVENOUS | Status: DC
  Administered 2021-03-30 – 2021-03-31 (×3): 1.5 g via INTRAVENOUS
  Filled 2021-03-30: qty 1.5
  Filled 2021-03-30: qty 4
  Filled 2021-03-30: qty 1.5
  Filled 2021-03-30 (×2): qty 4
  Filled 2021-03-30: qty 1.5
  Filled 2021-03-30: qty 4

## 2021-03-30 MED ORDER — METHOCARBAMOL 500 MG PO TABS: 500.0000 mg | ORAL_TABLET | Freq: Four times a day (QID) | ORAL | Status: DC | PRN

## 2021-03-30 MED ORDER — PROPOFOL 10 MG/ML IV BOLUS
INTRAVENOUS | Status: DC | PRN
  Administered 2021-03-30: 150 mg via INTRAVENOUS

## 2021-03-30 MED ORDER — ROCURONIUM BROMIDE 10 MG/ML (PF) SYRINGE
PREFILLED_SYRINGE | INTRAVENOUS | Status: AC
  Filled 2021-03-30: qty 10

## 2021-03-30 MED ORDER — LACTATED RINGERS IR SOLN
Status: DC | PRN
  Administered 2021-03-30: 1000 mL

## 2021-03-30 MED ORDER — HYDROMORPHONE HCL 1 MG/ML IJ SOLN
0.2500 mg | INTRAMUSCULAR | Status: DC | PRN
  Administered 2021-03-30 (×2): 0.5 mg via INTRAVENOUS

## 2021-03-30 MED ORDER — ENOXAPARIN SODIUM 40 MG/0.4ML IJ SOSY
40.0000 mg | PREFILLED_SYRINGE | INTRAMUSCULAR | Status: DC
  Administered 2021-03-31: 40 mg via SUBCUTANEOUS
  Filled 2021-03-30: qty 0.4

## 2021-03-30 MED ORDER — LIDOCAINE HCL (PF) 2 % IJ SOLN
INTRAMUSCULAR | Status: AC
  Filled 2021-03-30: qty 5

## 2021-03-30 MED ORDER — OXYCODONE HCL 5 MG PO TABS
5.0000 mg | ORAL_TABLET | ORAL | Status: DC | PRN
  Administered 2021-03-30: 10 mg via ORAL
  Filled 2021-03-30: qty 2

## 2021-03-30 MED ORDER — IOHEXOL 300 MG/ML  SOLN
INTRAMUSCULAR | Status: DC | PRN
  Administered 2021-03-30: 7 mL

## 2021-03-30 MED ORDER — ENOXAPARIN SODIUM 40 MG/0.4ML IJ SOSY: 40.0000 mg | PREFILLED_SYRINGE | INTRAMUSCULAR | Status: DC

## 2021-03-30 MED ORDER — OXYCODONE HCL 5 MG/5ML PO SOLN: 5.0000 mg | Freq: Once | ORAL | Status: DC | PRN

## 2021-03-30 MED ORDER — MIDAZOLAM HCL 5 MG/5ML IJ SOLN
INTRAMUSCULAR | Status: DC | PRN
  Administered 2021-03-30: 2 mg via INTRAVENOUS

## 2021-03-30 MED ORDER — FENTANYL CITRATE (PF) 250 MCG/5ML IJ SOLN
INTRAMUSCULAR | Status: AC
  Filled 2021-03-30: qty 5

## 2021-03-30 MED ORDER — SUGAMMADEX SODIUM 200 MG/2ML IV SOLN
INTRAVENOUS | Status: DC | PRN
  Administered 2021-03-30: 200 mg via INTRAVENOUS

## 2021-03-30 MED ORDER — MIDAZOLAM HCL 2 MG/2ML IJ SOLN
INTRAMUSCULAR | Status: AC
  Filled 2021-03-30: qty 2

## 2021-03-30 MED ORDER — OXYCODONE HCL 5 MG PO TABS: 5.0000 mg | ORAL_TABLET | Freq: Once | ORAL | Status: DC | PRN

## 2021-03-30 MED ORDER — MORPHINE SULFATE (PF) 2 MG/ML IV SOLN: 2.0000 mg | INTRAVENOUS | Status: DC | PRN

## 2021-03-30 MED ORDER — DEXAMETHASONE SODIUM PHOSPHATE 10 MG/ML IJ SOLN
INTRAMUSCULAR | Status: DC | PRN
  Administered 2021-03-30: 10 mg via INTRAVENOUS

## 2021-03-30 MED ORDER — ONDANSETRON HCL 4 MG/2ML IJ SOLN
INTRAMUSCULAR | Status: DC | PRN
  Administered 2021-03-30: 4 mg via INTRAVENOUS

## 2021-03-30 MED ORDER — KETOROLAC TROMETHAMINE 30 MG/ML IJ SOLN: 30.0000 mg | Freq: Once | INTRAMUSCULAR | Status: DC | PRN

## 2021-03-30 MED ORDER — BUPIVACAINE-EPINEPHRINE 0.25% -1:200000 IJ SOLN
INTRAMUSCULAR | Status: AC
  Filled 2021-03-30: qty 1

## 2021-03-30 SURGICAL SUPPLY — 42 items
ADH SKN CLS APL DERMABOND .7 (GAUZE/BANDAGES/DRESSINGS) ×1
APL PRP STRL LF DISP 70% ISPRP (MISCELLANEOUS) ×2
APPLIER CLIP ROT 10 11.4 M/L (STAPLE) ×2
APR CLP MED LRG 11.4X10 (STAPLE) ×1
BAG COUNTER SPONGE SURGICOUNT (BAG) IMPLANT
BAG SPEC RTRVL LRG 6X4 10 (ENDOMECHANICALS) ×1
BAG SPNG CNTER NS LX DISP (BAG)
CABLE HIGH FREQUENCY MONO STRZ (ELECTRODE) ×3 IMPLANT
CHLORAPREP W/TINT 26 (MISCELLANEOUS) ×6 IMPLANT
CLIP APPLIE ROT 10 11.4 M/L (STAPLE) ×2 IMPLANT
COVER MAYO STAND STRL (DRAPES) ×3 IMPLANT
COVER SURGICAL LIGHT HANDLE (MISCELLANEOUS) ×3 IMPLANT
DECANTER SPIKE VIAL GLASS SM (MISCELLANEOUS) ×3 IMPLANT
DERMABOND ADVANCED (GAUZE/BANDAGES/DRESSINGS) ×1
DERMABOND ADVANCED .7 DNX12 (GAUZE/BANDAGES/DRESSINGS) IMPLANT
DRAPE C-ARM 42X120 X-RAY (DRAPES) ×3 IMPLANT
ELECT REM PT RETURN 15FT ADLT (MISCELLANEOUS) ×3 IMPLANT
GAUZE SPONGE 2X2 8PLY STRL LF (GAUZE/BANDAGES/DRESSINGS) ×2 IMPLANT
GLOVE SURG SYN 7.5  E (GLOVE) ×4
GLOVE SURG SYN 7.5 E (GLOVE) ×2 IMPLANT
GLOVE SURG SYN 7.5 PF PI (GLOVE) ×4 IMPLANT
GOWN STRL REUS W/TWL XL LVL3 (GOWN DISPOSABLE) ×6 IMPLANT
HEMOSTAT SURGICEL 4X8 (HEMOSTASIS) IMPLANT
IRRIG SUCT STRYKERFLOW 2 WTIP (MISCELLANEOUS) ×2
IRRIGATION SUCT STRKRFLW 2 WTP (MISCELLANEOUS) ×2 IMPLANT
KIT BASIN OR (CUSTOM PROCEDURE TRAY) ×3 IMPLANT
KIT TURNOVER KIT A (KITS) IMPLANT
PENCIL SMOKE EVACUATOR (MISCELLANEOUS) IMPLANT
POUCH SPECIMEN RETRIEVAL 10MM (ENDOMECHANICALS) ×3 IMPLANT
SCISSORS LAP 5X35 DISP (ENDOMECHANICALS) ×3 IMPLANT
SET CHOLANGIOGRAPH MIX (MISCELLANEOUS) ×3 IMPLANT
SET TUBE SMOKE EVAC HIGH FLOW (TUBING) IMPLANT
SLEEVE XCEL OPT CAN 5 100 (ENDOMECHANICALS) ×3 IMPLANT
SPONGE GAUZE 2X2 STER 10/PKG (GAUZE/BANDAGES/DRESSINGS) ×1
STRIP CLOSURE SKIN 1/2X4 (GAUZE/BANDAGES/DRESSINGS) IMPLANT
SUT MNCRL AB 4-0 PS2 18 (SUTURE) ×3 IMPLANT
TOWEL OR 17X26 10 PK STRL BLUE (TOWEL DISPOSABLE) ×3 IMPLANT
TOWEL OR NON WOVEN STRL DISP B (DISPOSABLE) ×3 IMPLANT
TRAY LAPAROSCOPIC (CUSTOM PROCEDURE TRAY) ×3 IMPLANT
TROCAR BLADELESS OPT 5 100 (ENDOMECHANICALS) ×3 IMPLANT
TROCAR XCEL BLUNT TIP 100MML (ENDOMECHANICALS) ×3 IMPLANT
TROCAR XCEL NON-BLD 11X100MML (ENDOMECHANICALS) ×3 IMPLANT

## 2021-03-30 NOTE — Op Note (Signed)
Procedure Note  Pre-operative Diagnosis:  cholecystitis, cholelithiasis  Post-operative Diagnosis:  same  Surgeon:  Armandina Gemma, MD  Assistant:  Margie Billet, PA-C   Procedure:  Laparoscopic cholecystectomy with intra-operative cholangiography  Anesthesia:  General  Estimated Blood Loss:  minimal  Drains: none         Specimen: gallbladder to pathology  Indications:  Patient is a 66 yo male admitted with abdominal pain and bacteremia.  USN shows multiple gallstones.  Bilirubin was initially 4.6 but improved.  Patient now comes to surgery for cholecystectomy.  Procedure description: The patient was seen in the pre-op holding area. The risks, benefits, complications, treatment options, and expected outcomes were previously discussed with the patient. The patient agreed with the proposed plan and has signed the informed consent form.  The patient was transported to operating room #4 at the [facility]. The patient was placed in the supine position on the operating room table. Following induction of general anesthesia, the abdomen was prepped and draped in the usual aseptic fashion.  An incision was made in the skin near the umbilicus. The midline fascia was incised and the peritoneal cavity was entered and a Hasson cannula was introduced under direct vision. The cannula was secured with a 0-Vicryl pursestring suture. Pneumoperitoneum was established with carbon dioxide. Additional cannulae were introduced under direct vision along the right costal margin in the midline, mid-clavicular line, and anterior axillary line.   The gallbladder was identified.  It was markedly distended, inflamed, and tense.  It was aspirated with a trocor and the fundus grasped and retracted cephalad. Adhesions were taken down bluntly and the electrocautery was utilized as needed, taking care not to involve any adjacent structures. The infundibulum was grasped and retracted laterally, exposing the  peritoneum overlying the triangle of Calot. The peritoneum was incised and structures exposed with blunt dissection. The cystic duct was clearly identified, bluntly dissected circumferentially, and clipped at the neck of the gallbladder.  An incision was made in the cystic duct and the cholangiogram catheter introduced. The catheter was secured using an ligaclip.  Real-time cholangiography was performed using C-arm fluoroscopy.  There was rapid filling of a normal caliber common bile duct.  There was reflux of contrast into the left and right hepatic ductal systems.  There was free flow distally into the duodenum without filling defect or obstruction.  The catheter was removed from the peritoneal cavity.  The cystic duct was then ligated with ligaclips and divided. The cystic artery was identified, dissected circumferentially, ligated with ligaclips, and divided.  The gallbladder was dissected away from the gallbladder bed using the electrocautery for hemostasis. The gallbladder was completely removed from the liver and placed into an endocatch bag. The gallbladder was removed in the endocatch bag through the umbilical port site and submitted to pathology for review.  The right upper quadrant was irrigated and the gallbladder bed was inspected. Hemostasis was achieved with the electrocautery.  Cannulae were removed under direct vision and good hemostasis was noted. Pneumoperitoneum was released and the majority of the carbon dioxide evacuated. The umbilical wound was irrigated and the fascia was then closed with the pursestring suture.  Local anesthetic was infiltrated at all port sites. Skin incisions were closed with 4-0 Monocril subcuticular sutures and Dermabond was applied.  Instrument, sponge, and needle counts were correct at the conclusion of the case.  The patient was awakened from anesthesia and brought to the recovery room in stable condition.  The patient tolerated the procedure  well.   Armandina Gemma, MD [facility] Office: [phone]

## 2021-03-30 NOTE — Progress Notes (Signed)
Patient ID: Juan Snow, male   DOB: 03-03-1956, 66 y.o.   MRN: [id number] [facility] Surgery Progress Note  Day of Surgery  Subjective: CC-  Feels well this morning. No pain.  Tmax 100.4 overnight.    Objective: Vital signs in last 24 hours: Temp:  [98.7 F (37.1 C)-100.4 F (38 C)] 99.1 F (37.3 C) (01/27 0309) Pulse Rate:  [57-71] 57 (01/27 0309) Resp:  [13-18] 16 (01/27 0309) BP: (116-128)/(76-84) 118/76 (01/27 0309) SpO2:  [96 %-98 %] 96 % (01/27 0309) Last BM Date: 03/29/21  Intake/Output from previous day: 01/26 0701 - 01/27 0700 In: 1925.3 [I.V.:1725.3; IV Piggyback:200] Out: -  Intake/Output this shift: Total I/O In: 527.4 [I.V.:527.4] Out: -   PE: Abd: soft, ND, NT, +BS  Lab Results:  Recent Labs    03/29/21 0358 03/30/21 0420  WBC 5.5 5.6  HGB 14.4 14.1  HCT 42.9 41.3  PLT 134* 146*   BMET Recent Labs    03/29/21 0358 03/30/21 0420  NA 139 139  K 4.0 3.7  CL 108 108  CO2 24 23  GLUCOSE 107* 109*  BUN 22 21  CREATININE 1.52* <0.30*  CALCIUM 8.6* 8.6*   PT/INR No results for input(s): LABPROT, INR in the last 72 hours. CMP     Component Value Date/Time   NA 139 03/30/2021 0420   K 3.7 03/30/2021 0420   CL 108 03/30/2021 0420   CO2 23 03/30/2021 0420   GLUCOSE 109 (H) 03/30/2021 0420   BUN 21 03/30/2021 0420   CREATININE <0.30 (L) 03/30/2021 0420   CALCIUM 8.6 (L) 03/30/2021 0420   PROT 5.9 (L) 03/30/2021 0420   ALBUMIN 2.9 (L) 03/30/2021 0420   AST 14 (L) 03/30/2021 0420   ALT 38 03/30/2021 0420   ALKPHOS 79 03/30/2021 0420   BILITOT 1.6 (H) 03/30/2021 0420   GFRNONAA NOT CALCULATED 03/30/2021 0420   GFRAA >60 06/26/2017 0529   Lipase     Component Value Date/Time   LIPASE 24 03/27/2021 1658       Studies/Results: NM Hepatobiliary Liver Func  Result Date: 03/28/2021 CLINICAL DATA:  Elevated bilirubin. EXAM: NUCLEAR MEDICINE HEPATOBILIARY IMAGING TECHNIQUE: Sequential images of the abdomen were obtained out to  60 minutes following intravenous administration of radiopharmaceutical. RADIOPHARMACEUTICALS:  7.6 mCi Tc-56m  Choletec IV COMPARISON:  Ultrasound 03/27/2021 FINDINGS: Uniform uptake of radiotracer with liver. Delayed clearance of radiotracer from the blood pool consistent with elevated bilirubin. There is minimal excretion of radiotracer from the liver. Faint counts are noted in the common bile duct and proximal small at 60 minutes IV morphine was administered to augment filling of the gallbladder. Gallbladder does not fill after morphine administration; however, there is little radiotracer activity excreted into the bowel. The majority of radiotracer remains liver after 90 minutes of imaging. IMPRESSION: 1. Poor excretion of radiotracer from the liver suggest hepatic dysfunction such as hepatitis. 2. Counts are evident, albeit faintly, within the common bile duct and bowel consistent with patent common bile duct. 3. Gallbladder fails to fill after morphine augmentation; however, the sensitivity exam is lessened due to the poor of excretion radiotracer from the liver Electronically Signed   By: Suzy Bouchard M.D.   On: 03/28/2021 13:07   MR 3D Recon At Scanner  Addendum Date: 03/30/2021   ADDENDUM REPORT: 03/30/2021 09:53 ADDENDUM: Dictation error occurred in the findings and impression section of the report incorrectly states that there are 2 large choledocholithiasis the report should state: There are 2 large  CHOLELITHIASIS without findings of acute cholecystitis, biliary ductal dilation or choledocholithiasis. Electronically Signed   By: Dahlia Bailiff M.D.   On: 03/30/2021 09:53   Result Date: 03/30/2021 CLINICAL DATA:  Cholelithiasis. Right upper quadrant abdominal pain. EXAM: MRI ABDOMEN WITHOUT AND WITH CONTRAST (INCLUDING MRCP) TECHNIQUE: Multiplanar multisequence MR imaging of the abdomen was performed both before and after the administration of intravenous contrast. Heavily T2-weighted images of  the biliary and pancreatic ducts were obtained, and three-dimensional MRCP images were rendered by post processing. CONTRAST:  51mL GADAVIST GADOBUTROL 1 MMOL/ML IV SOLN COMPARISON:  Ultrasound March 27, 2021, CT June 26, 2015, and nuclear medicine HIDA scan March 28, 2021 FINDINGS: Lower chest: Heterogeneous signal in the lung bases, favor atelectasis. Hepatobiliary: No hepatic steatosis. Periportal edema. Scattered tiny hepatic cysts. T2 hyperintense 18 mm segment II hepatic lesion on image 18/5 which demonstrates nodular arterial enhancement with filling in of the lesion with contrast on delayed postcontrast imaging similar in intensity to background aorta, consistent with a benign hepatic hemangioma. Two large choledocholithiasis in a nondilated gallbladder without gallbladder wall thickening or pericholecystic fluid. No biliary ductal dilation and no choledocholithiasis. Pancreas: Intrinsic T1 signal of the pancreatic parenchyma is within normal limits. There is homogeneous enhancement of the pancreas postcontrast administration. No pancreatic ductal dilation. No cystic or solid hyperenhancing pancreatic lesions identified. Spleen:  Within normal limits in size and appearance. Adrenals/Urinary Tract: Bilateral adrenal glands appear normal. No hydronephrosis. No solid enhancing renal mass. Stomach/Bowel: Moderate-sized hiatal hernia. No evidence of acute bowel inflammation or pathologic dilation of loops of small or large bowel in the abdomen. Colonic diverticulosis without findings of acute diverticulitis. Vascular/Lymphatic: No abdominal aortic aneurysm. The portal, splenic and superior mesenteric veins are patent. No pathologically enlarged abdominal lymph nodes. Other:  No abdominal ascites. Musculoskeletal: No suspicious bone lesions identified. IMPRESSION: 1. Two large choledocholithiasis, without findings of acute cholecystitis, biliary ductal dilation or choledocholithiasis. 2. Periportal edema,  nonspecific but in correlation with findings recent HIDA scan most likely reflect hepatitis. 3. Moderate-sized hiatal hernia. 4. Heterogeneous signal in the lung bases, favor atelectasis. Electronically Signed: By: Dahlia Bailiff M.D. On: 03/30/2021 07:53   MR ABDOMEN MRCP W WO CONTAST  Addendum Date: 03/30/2021   ADDENDUM REPORT: 03/30/2021 09:53 ADDENDUM: Dictation error occurred in the findings and impression section of the report incorrectly states that there are 2 large choledocholithiasis the report should state: There are 2 large CHOLELITHIASIS without findings of acute cholecystitis, biliary ductal dilation or choledocholithiasis. Electronically Signed   By: Dahlia Bailiff M.D.   On: 03/30/2021 09:53   Result Date: 03/30/2021 CLINICAL DATA:  Cholelithiasis. Right upper quadrant abdominal pain. EXAM: MRI ABDOMEN WITHOUT AND WITH CONTRAST (INCLUDING MRCP) TECHNIQUE: Multiplanar multisequence MR imaging of the abdomen was performed both before and after the administration of intravenous contrast. Heavily T2-weighted images of the biliary and pancreatic ducts were obtained, and three-dimensional MRCP images were rendered by post processing. CONTRAST:  42mL GADAVIST GADOBUTROL 1 MMOL/ML IV SOLN COMPARISON:  Ultrasound March 27, 2021, CT June 26, 2015, and nuclear medicine HIDA scan March 28, 2021 FINDINGS: Lower chest: Heterogeneous signal in the lung bases, favor atelectasis. Hepatobiliary: No hepatic steatosis. Periportal edema. Scattered tiny hepatic cysts. T2 hyperintense 18 mm segment II hepatic lesion on image 18/5 which demonstrates nodular arterial enhancement with filling in of the lesion with contrast on delayed postcontrast imaging similar in intensity to background aorta, consistent with a benign hepatic hemangioma. Two large choledocholithiasis in a nondilated gallbladder without gallbladder  wall thickening or pericholecystic fluid. No biliary ductal dilation and no choledocholithiasis.  Pancreas: Intrinsic T1 signal of the pancreatic parenchyma is within normal limits. There is homogeneous enhancement of the pancreas postcontrast administration. No pancreatic ductal dilation. No cystic or solid hyperenhancing pancreatic lesions identified. Spleen:  Within normal limits in size and appearance. Adrenals/Urinary Tract: Bilateral adrenal glands appear normal. No hydronephrosis. No solid enhancing renal mass. Stomach/Bowel: Moderate-sized hiatal hernia. No evidence of acute bowel inflammation or pathologic dilation of loops of small or large bowel in the abdomen. Colonic diverticulosis without findings of acute diverticulitis. Vascular/Lymphatic: No abdominal aortic aneurysm. The portal, splenic and superior mesenteric veins are patent. No pathologically enlarged abdominal lymph nodes. Other:  No abdominal ascites. Musculoskeletal: No suspicious bone lesions identified. IMPRESSION: 1. Two large choledocholithiasis, without findings of acute cholecystitis, biliary ductal dilation or choledocholithiasis. 2. Periportal edema, nonspecific but in correlation with findings recent HIDA scan most likely reflect hepatitis. 3. Moderate-sized hiatal hernia. 4. Heterogeneous signal in the lung bases, favor atelectasis. Electronically Signed: By: Dahlia Bailiff M.D. On: 03/30/2021 07:53    Anti-infectives: Anti-infectives (From admission, onward)    Start     Dose/Rate Route Frequency Ordered Stop   03/30/21 1200  ampicillin-sulbactam (UNASYN) 1.5 g in sodium chloride 0.9 % 100 mL IVPB        1.5 g 200 mL/hr over 30 Minutes Intravenous Every 6 hours 03/30/21 1021     03/29/21 1200  cefTRIAXone (ROCEPHIN) 2 g in sodium chloride 0.9 % 100 mL IVPB  Status:  Discontinued        2 g 200 mL/hr over 30 Minutes Intravenous Every 24 hours 03/29/21 1036 03/30/21 1021   03/29/21 0200  piperacillin-tazobactam (ZOSYN) IVPB 3.375 g  Status:  Discontinued        3.375 g 12.5 mL/hr over 240 Minutes Intravenous Every 8  hours 03/28/21 1944 03/29/21 1036   03/28/21 0200  piperacillin-tazobactam (ZOSYN) IVPB 3.375 g  Status:  Discontinued        3.375 g 12.5 mL/hr over 240 Minutes Intravenous Every 8 hours 03/27/21 2050 03/28/21 1944   03/27/21 1815  piperacillin-tazobactam (ZOSYN) IVPB 3.375 g        3.375 g 100 mL/hr over 30 Minutes Intravenous  Once 03/27/21 1806 03/27/21 1843        Assessment/Plan Epigastric abdominal pain Gallstones Hyperbilirubinemia  - downtrending Bacteremia - E coli and Enterobacterales  - HIDA with evidence of hepatic dysfunction (?hepatitis pattern) but no obstruction - LFTs are trending down today and pain has improved - MRCP negative for choledocholithiasis -will plan to proceed with lap chole today with IOC -I have explained the procedure, risks, and aftercare of cholecystectomy.  Risks include but are not limited to bleeding, infection, wound problems, anesthesia, diarrhea, bile leak, injury to common bile duct/liver/intestine.  He seems to understand and agrees to proceed.  I drew a picture for explanation as well    ID - zosyn 1/24>>1/27, Unasyn 1/27 --> VTE - SCDs, Lovenox FEN - IVF, NPO Foley - none   HLD GERD AKI Thrombocytopenia  Moderate Medical Decision Making   LOS: 2 days    Henreitta Cea, [facility] Surgery 03/30/2021, 11:11 AM Please see Amion for pager number during day hours 7:00am-4:30pm

## 2021-03-30 NOTE — H&P (View-Only) (Signed)
Patient ID: Juan Snow, male   DOB: 1956/01/30, 66 y.o.   MRN: [id number] [facility] Progress Note  Day of Surgery  Subjective: CC-  Feels well this morning. No pain.  Tmax 100.4 overnight.    Objective: Vital signs in last 24 hours: Temp:  [98.7 F (37.1 C)-100.4 F (38 C)] 99.1 F (37.3 C) (01/27 0309) Pulse Rate:  [57-71] 57 (01/27 0309) Resp:  [13-18] 16 (01/27 0309) BP: (116-128)/(76-84) 118/76 (01/27 0309) SpO2:  [96 %-98 %] 96 % (01/27 0309) Last BM Date: 03/29/21  Intake/Output from previous day: 01/26 0701 - 01/27 0700 In: 1925.3 [I.V.:1725.3; IV Piggyback:200] Out: -  Intake/Output this shift: Total I/O In: 527.4 [I.V.:527.4] Out: -   PE: Abd: soft, ND, NT, +BS  Lab Results:  Recent Labs    03/29/21 0358 03/30/21 0420  WBC 5.5 5.6  HGB 14.4 14.1  HCT 42.9 41.3  PLT 134* 146*   BMET Recent Labs    03/29/21 0358 03/30/21 0420  NA 139 139  K 4.0 3.7  CL 108 108  CO2 24 23  GLUCOSE 107* 109*  BUN 22 21  CREATININE 1.52* <0.30*  CALCIUM 8.6* 8.6*   PT/INR No results for input(s): LABPROT, INR in the last 72 hours. CMP     Component Value Date/Time   NA 139 03/30/2021 0420   K 3.7 03/30/2021 0420   CL 108 03/30/2021 0420   CO2 23 03/30/2021 0420   GLUCOSE 109 (H) 03/30/2021 0420   BUN 21 03/30/2021 0420   CREATININE <0.30 (L) 03/30/2021 0420   CALCIUM 8.6 (L) 03/30/2021 0420   PROT 5.9 (L) 03/30/2021 0420   ALBUMIN 2.9 (L) 03/30/2021 0420   AST 14 (L) 03/30/2021 0420   ALT 38 03/30/2021 0420   ALKPHOS 79 03/30/2021 0420   BILITOT 1.6 (H) 03/30/2021 0420   GFRNONAA NOT CALCULATED 03/30/2021 0420   GFRAA >60 06/26/2017 0529   Lipase     Component Value Date/Time   LIPASE 24 03/27/2021 1658       Studies/Results: NM Hepatobiliary Liver Func  Result Date: 03/28/2021 CLINICAL DATA:  Elevated bilirubin. EXAM: NUCLEAR MEDICINE HEPATOBILIARY IMAGING TECHNIQUE: Sequential images of the abdomen were obtained out to  60 minutes following intravenous administration of radiopharmaceutical. RADIOPHARMACEUTICALS:  7.6 mCi Tc-38m  Choletec IV COMPARISON:  Ultrasound 03/27/2021 FINDINGS: Uniform uptake of radiotracer with liver. Delayed clearance of radiotracer from the blood pool consistent with elevated bilirubin. There is minimal excretion of radiotracer from the liver. Faint counts are noted in the common bile duct and proximal small at 60 minutes IV morphine was administered to augment filling of the gallbladder. Gallbladder does not fill after morphine administration; however, there is little radiotracer activity excreted into the bowel. The majority of radiotracer remains liver after 90 minutes of imaging. IMPRESSION: 1. Poor excretion of radiotracer from the liver suggest hepatic dysfunction such as hepatitis. 2. Counts are evident, albeit faintly, within the common bile duct and bowel consistent with patent common bile duct. 3. Gallbladder fails to fill after morphine augmentation; however, the sensitivity exam is lessened due to the poor of excretion radiotracer from the liver Electronically Signed   By: Suzy Bouchard M.D.   On: 03/28/2021 13:07   MR 3D Recon At Scanner  Addendum Date: 03/30/2021   ADDENDUM REPORT: 03/30/2021 09:53 ADDENDUM: Dictation error occurred in the findings and impression section of the report incorrectly states that there are 2 large choledocholithiasis the report should state: There are 2 large  CHOLELITHIASIS without findings of acute cholecystitis, biliary ductal dilation or choledocholithiasis. Electronically Signed   By: Dahlia Bailiff M.D.   On: 03/30/2021 09:53   Result Date: 03/30/2021 CLINICAL DATA:  Cholelithiasis. Right upper quadrant abdominal pain. EXAM: MRI ABDOMEN WITHOUT AND WITH CONTRAST (INCLUDING MRCP) TECHNIQUE: Multiplanar multisequence MR imaging of the abdomen was performed both before and after the administration of intravenous contrast. Heavily T2-weighted images of  the biliary and pancreatic ducts were obtained, and three-dimensional MRCP images were rendered by post processing. CONTRAST:  5mL GADAVIST GADOBUTROL 1 MMOL/ML IV SOLN COMPARISON:  Ultrasound March 27, 2021, CT June 26, 2015, and nuclear medicine HIDA scan March 28, 2021 FINDINGS: Lower chest: Heterogeneous signal in the lung bases, favor atelectasis. Hepatobiliary: No hepatic steatosis. Periportal edema. Scattered tiny hepatic cysts. T2 hyperintense 18 mm segment II hepatic lesion on image 18/5 which demonstrates nodular arterial enhancement with filling in of the lesion with contrast on delayed postcontrast imaging similar in intensity to background aorta, consistent with a benign hepatic hemangioma. Two large choledocholithiasis in a nondilated gallbladder without gallbladder wall thickening or pericholecystic fluid. No biliary ductal dilation and no choledocholithiasis. Pancreas: Intrinsic T1 signal of the pancreatic parenchyma is within normal limits. There is homogeneous enhancement of the pancreas postcontrast administration. No pancreatic ductal dilation. No cystic or solid hyperenhancing pancreatic lesions identified. Spleen:  Within normal limits in size and appearance. Adrenals/Urinary Tract: Bilateral adrenal glands appear normal. No hydronephrosis. No solid enhancing renal mass. Stomach/Bowel: Moderate-sized hiatal hernia. No evidence of acute bowel inflammation or pathologic dilation of loops of small or large bowel in the abdomen. Colonic diverticulosis without findings of acute diverticulitis. Vascular/Lymphatic: No abdominal aortic aneurysm. The portal, splenic and superior mesenteric veins are patent. No pathologically enlarged abdominal lymph nodes. Other:  No abdominal ascites. Musculoskeletal: No suspicious bone lesions identified. IMPRESSION: 1. Two large choledocholithiasis, without findings of acute cholecystitis, biliary ductal dilation or choledocholithiasis. 2. Periportal edema,  nonspecific but in correlation with findings recent HIDA scan most likely reflect hepatitis. 3. Moderate-sized hiatal hernia. 4. Heterogeneous signal in the lung bases, favor atelectasis. Electronically Signed: By: Dahlia Bailiff M.D. On: 03/30/2021 07:53   MR ABDOMEN MRCP W WO CONTAST  Addendum Date: 03/30/2021   ADDENDUM REPORT: 03/30/2021 09:53 ADDENDUM: Dictation error occurred in the findings and impression section of the report incorrectly states that there are 2 large choledocholithiasis the report should state: There are 2 large CHOLELITHIASIS without findings of acute cholecystitis, biliary ductal dilation or choledocholithiasis. Electronically Signed   By: Dahlia Bailiff M.D.   On: 03/30/2021 09:53   Result Date: 03/30/2021 CLINICAL DATA:  Cholelithiasis. Right upper quadrant abdominal pain. EXAM: MRI ABDOMEN WITHOUT AND WITH CONTRAST (INCLUDING MRCP) TECHNIQUE: Multiplanar multisequence MR imaging of the abdomen was performed both before and after the administration of intravenous contrast. Heavily T2-weighted images of the biliary and pancreatic ducts were obtained, and three-dimensional MRCP images were rendered by post processing. CONTRAST:  28mL GADAVIST GADOBUTROL 1 MMOL/ML IV SOLN COMPARISON:  Ultrasound March 27, 2021, CT June 26, 2015, and nuclear medicine HIDA scan March 28, 2021 FINDINGS: Lower chest: Heterogeneous signal in the lung bases, favor atelectasis. Hepatobiliary: No hepatic steatosis. Periportal edema. Scattered tiny hepatic cysts. T2 hyperintense 18 mm segment II hepatic lesion on image 18/5 which demonstrates nodular arterial enhancement with filling in of the lesion with contrast on delayed postcontrast imaging similar in intensity to background aorta, consistent with a benign hepatic hemangioma. Two large choledocholithiasis in a nondilated gallbladder without gallbladder  wall thickening or pericholecystic fluid. No biliary ductal dilation and no choledocholithiasis.  Pancreas: Intrinsic T1 signal of the pancreatic parenchyma is within normal limits. There is homogeneous enhancement of the pancreas postcontrast administration. No pancreatic ductal dilation. No cystic or solid hyperenhancing pancreatic lesions identified. Spleen:  Within normal limits in size and appearance. Adrenals/Urinary Tract: Bilateral adrenal glands appear normal. No hydronephrosis. No solid enhancing renal mass. Stomach/Bowel: Moderate-sized hiatal hernia. No evidence of acute bowel inflammation or pathologic dilation of loops of small or large bowel in the abdomen. Colonic diverticulosis without findings of acute diverticulitis. Vascular/Lymphatic: No abdominal aortic aneurysm. The portal, splenic and superior mesenteric veins are patent. No pathologically enlarged abdominal lymph nodes. Other:  No abdominal ascites. Musculoskeletal: No suspicious bone lesions identified. IMPRESSION: 1. Two large choledocholithiasis, without findings of acute cholecystitis, biliary ductal dilation or choledocholithiasis. 2. Periportal edema, nonspecific but in correlation with findings recent HIDA scan most likely reflect hepatitis. 3. Moderate-sized hiatal hernia. 4. Heterogeneous signal in the lung bases, favor atelectasis. Electronically Signed: By: Dahlia Bailiff M.D. On: 03/30/2021 07:53    Anti-infectives: Anti-infectives (From admission, onward)    Start     Dose/Rate Route Frequency Ordered Stop   03/30/21 1200  ampicillin-sulbactam (UNASYN) 1.5 g in sodium chloride 0.9 % 100 mL IVPB        1.5 g 200 mL/hr over 30 Minutes Intravenous Every 6 hours 03/30/21 1021     03/29/21 1200  cefTRIAXone (ROCEPHIN) 2 g in sodium chloride 0.9 % 100 mL IVPB  Status:  Discontinued        2 g 200 mL/hr over 30 Minutes Intravenous Every 24 hours 03/29/21 1036 03/30/21 1021   03/29/21 0200  piperacillin-tazobactam (ZOSYN) IVPB 3.375 g  Status:  Discontinued        3.375 g 12.5 mL/hr over 240 Minutes Intravenous Every 8  hours 03/28/21 1944 03/29/21 1036   03/28/21 0200  piperacillin-tazobactam (ZOSYN) IVPB 3.375 g  Status:  Discontinued        3.375 g 12.5 mL/hr over 240 Minutes Intravenous Every 8 hours 03/27/21 2050 03/28/21 1944   03/27/21 1815  piperacillin-tazobactam (ZOSYN) IVPB 3.375 g        3.375 g 100 mL/hr over 30 Minutes Intravenous  Once 03/27/21 1806 03/27/21 1843        Assessment/Plan Epigastric abdominal pain Gallstones Hyperbilirubinemia  - downtrending Bacteremia - E coli and Enterobacterales  - HIDA with evidence of hepatic dysfunction (?hepatitis pattern) but no obstruction - LFTs are trending down today and pain has improved - MRCP negative for choledocholithiasis -will plan to proceed with lap chole today with IOC -I have explained the procedure, risks, and aftercare of cholecystectomy.  Risks include but are not limited to bleeding, infection, wound problems, anesthesia, diarrhea, bile leak, injury to common bile duct/liver/intestine.  He seems to understand and agrees to proceed.  I drew a picture for explanation as well    ID - zosyn 1/24>>1/27, Unasyn 1/27 --> VTE - SCDs, Lovenox FEN - IVF, NPO Foley - none   HLD GERD AKI Thrombocytopenia  Moderate Medical Decision Making   LOS: 2 days    Henreitta Cea, [facility] 03/30/2021, 11:11 AM Please see Amion for pager number during day hours 7:00am-4:30pm

## 2021-03-30 NOTE — Discharge Instructions (Signed)
[facility]  Please arrive at least 30 min before your appointment to complete your check in paperwork.  If you are unable to arrive 30 min prior to your appointment time we may have to cancel or reschedule you. LAPAROSCOPIC SURGERY: POST OP INSTRUCTIONS Always review your discharge instruction sheet given to you by the facility where your surgery was performed. IF YOU HAVE DISABILITY OR FAMILY LEAVE FORMS, YOU MUST BRING THEM TO THE OFFICE FOR PROCESSING.   DO NOT GIVE THEM TO YOUR DOCTOR.  PAIN CONTROL  First take acetaminophen (Tylenol) AND/or ibuprofen (Advil) to control your pain after surgery.  Follow directions on package.  Taking acetaminophen (Tylenol) and/or ibuprofen (Advil) regularly after surgery will help to control your pain and lower the amount of prescription pain medication you may need.  You should not take more than 4,000 mg (4 grams) of acetaminophen (Tylenol) in 24 hours.  You should not take ibuprofen (Advil), aleve, motrin, naprosyn or other NSAIDS if you have a history of stomach ulcers or chronic kidney disease.  A prescription for pain medication may be given to you upon discharge.  Take your pain medication as prescribed, if you still have uncontrolled pain after taking acetaminophen (Tylenol) or ibuprofen (Advil). Use ice packs to help control pain. If you need a refill on your pain medication, please contact your pharmacy.  They will contact our office to request authorization. Prescriptions will not be filled after 5pm or on week-ends.  HOME MEDICATIONS Take your usually prescribed medications unless otherwise directed.  DIET You should follow a light diet the first few days after arrival home.  Be sure to include lots of fluids daily. Avoid fatty, fried foods.   CONSTIPATION It is common to experience some constipation after surgery and if you are taking pain medication.  Increasing fluid intake and taking a stool softener (such as Colace)  will usually help or prevent this problem from occurring.  A mild laxative (Milk of Magnesia or Miralax) should be taken according to package instructions if there are no bowel movements after 48 hours.  WOUND/INCISION CARE Most patients will experience some swelling and bruising in the area of the incisions.  Ice packs will help.  Swelling and bruising can take several days to resolve.  Unless discharge instructions indicate otherwise, follow guidelines below  STERI-STRIPS - you may remove your outer bandages 48 hours after surgery, and you may shower at that time.  You have steri-strips (small skin tapes) in place directly over the incision.  These strips should be left on the skin for 7-10 days.   DERMABOND/SKIN GLUE - you may shower in 24 hours.  The glue will flake off over the next 2-3 weeks. Any sutures or staples will be removed at the office during your follow-up visit.  ACTIVITIES You may resume regular (light) daily activities beginning the next day--such as daily self-care, walking, climbing stairs--gradually increasing activities as tolerated.  You may have sexual intercourse when it is comfortable.  Refrain from any heavy lifting or straining until approved by your doctor. You may drive when you are no longer taking prescription pain medication, you can comfortably wear a seatbelt, and you can safely maneuver your car and apply brakes.  FOLLOW-UP You should see your doctor in the office for a follow-up appointment approximately 2-3 weeks after your surgery.  You should have been given your post-op/follow-up appointment when your surgery was scheduled.  If you did not receive a post-op/follow-up appointment, make sure   that you call for this appointment within a day or two after you arrive home to insure a convenient appointment time.  OTHER INSTRUCTIONS  WHEN TO CALL YOUR DOCTOR: Fever over 101.0 Inability to urinate Continued bleeding from incision. Increased pain, redness, or  drainage from the incision. Increasing abdominal pain  The clinic staff is available to answer your questions during regular business hours.  Please don't hesitate to call and ask to speak to one of the nurses for clinical concerns.  If you have a medical emergency, go to the nearest emergency room or call 911.  A surgeon from [facility] is always on call at the hospital. [address], [address], [place], [place]  [address] ? [address] [address], [place],    [address] [phone] ? [phone] ? FAX [phone]   

## 2021-03-30 NOTE — Anesthesia Procedure Notes (Signed)
Procedure Name: Intubation Date/Time: 03/30/2021 12:52 PM Performed by: Talbot Grumbling, CRNA Pre-anesthesia Checklist: Patient identified, Emergency Drugs available, Suction available and Patient being monitored Patient Re-evaluated:Patient Re-evaluated prior to induction Oxygen Delivery Method: Circle system utilized Preoxygenation: Pre-oxygenation with 100% oxygen Induction Type: IV induction Ventilation: Mask ventilation without difficulty Laryngoscope Size: Mac and 3 Grade View: Grade II Tube type: Oral Tube size: 7.5 mm Number of attempts: 1 Airway Equipment and Method: Stylet Placement Confirmation: ETT inserted through vocal cords under direct vision, positive ETCO2 and breath sounds checked- equal and bilateral Secured at: 22 cm Tube secured with: Tape Dental Injury: Teeth and Oropharynx as per pre-operative assessment

## 2021-03-30 NOTE — Progress Notes (Signed)
Pt back to room from MRI. Delia Heady RN

## 2021-03-30 NOTE — Progress Notes (Signed)
Pt off unit to MRI via wheelchair. Pt's spouse remains at bedside. Delia Heady RN

## 2021-03-30 NOTE — Progress Notes (Signed)
[facility] PROGRESS NOTE    Progress Note  Juan Snow  VEH:[id number] DOB: 05-17-1955 DOA: 03/27/2021 PCP: Vernie Shanks, MD     Brief Narrative:   Juan Snow is an 66 y.o. male past medical history significant for hyperlipidemia and GERD comes in with abdominal pain fever and chills admitted for sepsis started empirically on antibiotics   Significant studies: Abdominal ultrasound showed probable stone in the neck of the gallbladder  Antibiotics: None  Microbiology data: Blood culture:  Procedures: None  Assessment/Plan:   Severe sepsis (HCC)/E. coli bacteremia and probable cholangitis: T-max of 100.4, blood cultures BC ID grew E. coli. Discontinue Zosyn start IV Rocephin. MRCP showed 2 large choledocholithiasis without acute cholecystitis periportal edema. Will notify GI that he possibly needs an ERCP. Surgery on board patient will be scheduled for lap chole post ERCP.  AKI (acute kidney injury) ([place]) With a baseline creatinine of 1, this morning is 1.5 . Resolved with fluid resuscitation.  Hypokalemia/hypomagnesemia Both have improved.  Continue to monitor intermittently.  Prolonged QT interval Likely due to electrolyte imbalance replete.  Gall bladder stones LFTs elevated including bilirubin alkaline phosphatase within normal. Surgery recommended lap [place].  Aspiration pneumonia ([place]) Unlikely, he denies any cough or shortness of breath, chest x-ray showed scarring.   DVT prophylaxis: lovenox Family Communication:wife Status is: Observation  The patient will require care spanning > 2 midnights and should be moved to inpatient because: Severe sepsis of unclear source    Code Status:     Code Status Orders  (From admission, onward)           Start     Ordered   03/27/21 2129  Full code  Continuous        03/27/21 2128           Code Status History     Date Active Date Inactive Code Status Order ID Comments User  Context   06/25/2017 1649 06/26/2017 1820 Full Code [id number]  Gaynelle Arabian, MD Inpatient         IV Access:   Peripheral IV   Procedures and diagnostic studies:   NM Hepatobiliary Liver Func  Result Date: 03/28/2021 CLINICAL DATA:  Elevated bilirubin. EXAM: NUCLEAR MEDICINE HEPATOBILIARY IMAGING TECHNIQUE: Sequential images of the abdomen were obtained out to 60 minutes following intravenous administration of radiopharmaceutical. RADIOPHARMACEUTICALS:  7.6 mCi Tc-52m  Choletec IV COMPARISON:  Ultrasound 03/27/2021 FINDINGS: Uniform uptake of radiotracer with liver. Delayed clearance of radiotracer from the blood pool consistent with elevated bilirubin. There is minimal excretion of radiotracer from the liver. Faint counts are noted in the common bile duct and proximal small at 60 minutes IV morphine was administered to augment filling of the gallbladder. Gallbladder does not fill after morphine administration; however, there is little radiotracer activity excreted into the bowel. The majority of radiotracer remains liver after 90 minutes of imaging. IMPRESSION: 1. Poor excretion of radiotracer from the liver suggest hepatic dysfunction such as hepatitis. 2. Counts are evident, albeit faintly, within the common bile duct and bowel consistent with patent common bile duct. 3. Gallbladder fails to fill after morphine augmentation; however, the sensitivity exam is lessened due to the poor of excretion radiotracer from the liver Electronically Signed   By: Suzy Bouchard M.D.   On: 03/28/2021 13:07   MR 3D Recon At Scanner  Result Date: 03/30/2021 CLINICAL DATA:  Cholelithiasis. Right upper quadrant abdominal pain. EXAM: MRI ABDOMEN WITHOUT AND WITH CONTRAST (INCLUDING MRCP) TECHNIQUE: Multiplanar multisequence  MR imaging of the abdomen was performed both before and after the administration of intravenous contrast. Heavily T2-weighted images of the biliary and pancreatic ducts were obtained, and  three-dimensional MRCP images were rendered by post processing. CONTRAST:  75mL GADAVIST GADOBUTROL 1 MMOL/ML IV SOLN COMPARISON:  Ultrasound March 27, 2021, CT June 26, 2015, and nuclear medicine HIDA scan March 28, 2021 FINDINGS: Lower chest: Heterogeneous signal in the lung bases, favor atelectasis. Hepatobiliary: No hepatic steatosis. Periportal edema. Scattered tiny hepatic cysts. T2 hyperintense 18 mm segment II hepatic lesion on image 18/5 which demonstrates nodular arterial enhancement with filling in of the lesion with contrast on delayed postcontrast imaging similar in intensity to background aorta, consistent with a benign hepatic hemangioma. Two large choledocholithiasis in a nondilated gallbladder without gallbladder wall thickening or pericholecystic fluid. No biliary ductal dilation and no choledocholithiasis. Pancreas: Intrinsic T1 signal of the pancreatic parenchyma is within normal limits. There is homogeneous enhancement of the pancreas postcontrast administration. No pancreatic ductal dilation. No cystic or solid hyperenhancing pancreatic lesions identified. Spleen:  Within normal limits in size and appearance. Adrenals/Urinary Tract: Bilateral adrenal glands appear normal. No hydronephrosis. No solid enhancing renal mass. Stomach/Bowel: Moderate-sized hiatal hernia. No evidence of acute bowel inflammation or pathologic dilation of loops of small or large bowel in the abdomen. Colonic diverticulosis without findings of acute diverticulitis. Vascular/Lymphatic: No abdominal aortic aneurysm. The portal, splenic and superior mesenteric veins are patent. No pathologically enlarged abdominal lymph nodes. Other:  No abdominal ascites. Musculoskeletal: No suspicious bone lesions identified. IMPRESSION: 1. Two large choledocholithiasis, without findings of acute cholecystitis, biliary ductal dilation or choledocholithiasis. 2. Periportal edema, nonspecific but in correlation with findings recent HIDA  scan most likely reflect hepatitis. 3. Moderate-sized hiatal hernia. 4. Heterogeneous signal in the lung bases, favor atelectasis. Electronically Signed   By: Dahlia Bailiff M.D.   On: 03/30/2021 07:53   MR ABDOMEN MRCP W WO CONTAST  Result Date: 03/30/2021 CLINICAL DATA:  Cholelithiasis. Right upper quadrant abdominal pain. EXAM: MRI ABDOMEN WITHOUT AND WITH CONTRAST (INCLUDING MRCP) TECHNIQUE: Multiplanar multisequence MR imaging of the abdomen was performed both before and after the administration of intravenous contrast. Heavily T2-weighted images of the biliary and pancreatic ducts were obtained, and three-dimensional MRCP images were rendered by post processing. CONTRAST:  61mL GADAVIST GADOBUTROL 1 MMOL/ML IV SOLN COMPARISON:  Ultrasound March 27, 2021, CT June 26, 2015, and nuclear medicine HIDA scan March 28, 2021 FINDINGS: Lower chest: Heterogeneous signal in the lung bases, favor atelectasis. Hepatobiliary: No hepatic steatosis. Periportal edema. Scattered tiny hepatic cysts. T2 hyperintense 18 mm segment II hepatic lesion on image 18/5 which demonstrates nodular arterial enhancement with filling in of the lesion with contrast on delayed postcontrast imaging similar in intensity to background aorta, consistent with a benign hepatic hemangioma. Two large choledocholithiasis in a nondilated gallbladder without gallbladder wall thickening or pericholecystic fluid. No biliary ductal dilation and no choledocholithiasis. Pancreas: Intrinsic T1 signal of the pancreatic parenchyma is within normal limits. There is homogeneous enhancement of the pancreas postcontrast administration. No pancreatic ductal dilation. No cystic or solid hyperenhancing pancreatic lesions identified. Spleen:  Within normal limits in size and appearance. Adrenals/Urinary Tract: Bilateral adrenal glands appear normal. No hydronephrosis. No solid enhancing renal mass. Stomach/Bowel: Moderate-sized hiatal hernia. No evidence of acute  bowel inflammation or pathologic dilation of loops of small or large bowel in the abdomen. Colonic diverticulosis without findings of acute diverticulitis. Vascular/Lymphatic: No abdominal aortic aneurysm. The portal, splenic and superior mesenteric veins are  patent. No pathologically enlarged abdominal lymph nodes. Other:  No abdominal ascites. Musculoskeletal: No suspicious bone lesions identified. IMPRESSION: 1. Two large choledocholithiasis, without findings of acute cholecystitis, biliary ductal dilation or choledocholithiasis. 2. Periportal edema, nonspecific but in correlation with findings recent HIDA scan most likely reflect hepatitis. 3. Moderate-sized hiatal hernia. 4. Heterogeneous signal in the lung bases, favor atelectasis. Electronically Signed   By: Dahlia Bailiff M.D.   On: 03/30/2021 07:53     Medical Consultants:   None.   Subjective:    Worthy Keeler feels great hungry  Objective:    Vitals:   03/29/21 1302 03/29/21 2042 03/29/21 2300 03/30/21 0309  BP: 128/83 116/84  118/76  Pulse: (!) 59 71  (!) 57  Resp: 18 13  16   Temp: 98.9 F (37.2 C) (!) 100.4 F (38 C) 98.7 F (37.1 C) 99.1 F (37.3 C)  TempSrc: Oral Oral Oral Oral  SpO2: 98% 97%  96%  Weight:      Height:       SpO2: 96 %   Intake/Output Summary (Last 24 hours) at 03/30/2021 0920 Last data filed at 03/30/2021 0734 Gross per 24 hour  Intake 2452.74 ml  Output --  Net 2452.74 ml    Filed Weights   03/27/21 2039 03/27/21 2057  Weight: 85.3 kg 85.3 kg    Exam: General exam: In no acute distress. Respiratory system: Good air movement and clear to auscultation. Cardiovascular system: S1 & S2 heard, RRR. No JVD. Gastrointestinal system: Abdomen is nondistended, soft and nontender.  Extremities: No pedal edema. Skin: No rashes, lesions or ulcers Psychiatry: Judgement and insight appear normal. Mood & affect appropriate.  Data Reviewed:    Labs: Basic Metabolic Panel: Recent Labs  Lab  03/27/21 1658 03/27/21 2311 03/28/21 0405 03/28/21 1243 03/29/21 0358 03/30/21 0420  NA 133*  --  141 137 139 139  K 3.9  --  3.0* 4.0 4.0 3.7  CL 101  --  118* 107 108 108  CO2 20*  --  18* 22 24 23   GLUCOSE 155*  --  87 117* 107* 109*  BUN 20  --  15 20 22 21   CREATININE 1.40*  --  1.03 1.41* 1.52* <0.30*  CALCIUM 9.2  --  5.8* 8.5* 8.6* 8.6*  MG 1.7  --  1.4*  --  2.3  --   PHOS  --  4.2 2.7  --   --   --     GFR CrCl cannot be calculated (This lab value cannot be used to calculate CrCl because it is not a number: <0.30). Liver Function Tests: Recent Labs  Lab 03/27/21 1658 03/27/21 2311 03/28/21 0405 03/28/21 1243 03/29/21 0358 03/30/21 0420  AST 39  --  24 35 27 14*  ALT 77*  --  45* 67* 56* 38  ALKPHOS 103  --  58 91 92 79  BILITOT 4.6* 5.4* 3.9* 5.1* 2.7* 1.6*  PROT 7.1  --  3.9* 6.0* 6.0* 5.9*  ALBUMIN 4.0  --  2.0* 3.2* 3.2* 2.9*    Recent Labs  Lab 03/27/21 1658  LIPASE 24    No results for input(s): AMMONIA in the last 168 hours. Coagulation profile No results for input(s): INR, PROTIME in the last 168 hours. COVID-19 Labs  Recent Labs    03/27/21 2311  DDIMER 1.05*     Lab Results  Component Value Date   [place] NEGATIVE 03/27/2021    CBC: Recent Labs  Lab 03/27/21 1658  03/28/21 0405 03/28/21 1350 03/29/21 0358 03/30/21 0420  WBC 13.9* 10.1 7.8 5.5 5.6  NEUTROABS 13.4* 9.1*  --   --   --   HGB 17.2* 14.8 14.4 14.4 14.1  HCT 49.8 44.2 43.6 42.9 41.3  MCV 90.1 92.3 93.8 91.5 91.6  PLT 117* 108* 112* 134* 146*    Cardiac Enzymes: Recent Labs  Lab 03/27/21 2311  CKTOTAL 93    BNP (last 3 results) No results for input(s): PROBNP in the last 8760 hours. CBG: No results for input(s): GLUCAP in the last 168 hours. D-Dimer: Recent Labs    03/27/21 2311  DDIMER 1.05*    Hgb A1c: No results for input(s): HGBA1C in the last 72 hours. Lipid Profile: No results for input(s): CHOL, HDL, LDLCALC, TRIG, CHOLHDL,  LDLDIRECT in the last 72 hours. Thyroid function studies: Recent Labs    03/27/21 2311  TSH 0.785    Anemia work up: No results for input(s): VITAMINB12, FOLATE, FERRITIN, TIBC, IRON, RETICCTPCT in the last 72 hours. Sepsis Labs: Recent Labs  Lab 03/27/21 1658 03/27/21 2027 03/27/21 2311 03/28/21 0015 03/28/21 0405 03/28/21 1350 03/29/21 0358 03/30/21 0420  PROCALCITON  --   --  34.45  --   --   --   --   --   WBC 13.9*  --   --   --  10.1 7.8 5.5 5.6  LATICACIDVEN 2.2* 1.6 1.6 1.5  --   --   --   --     Microbiology Recent Results (from the past 240 hour(s))  Culture, blood (routine x 2)     Status: Abnormal   Collection Time: 03/27/21  4:56 PM   Specimen: Right Antecubital; Blood  Result Value Ref Range Status   Specimen Description   Final    RIGHT ANTECUBITAL Performed at [facility] [place] [address] [place], [place] [address]    Special Requests   Final    BOTTLES DRAWN AEROBIC AND ANAEROBIC Blood Culture adequate volume Performed at [facility] [address], [place],  [address]    Culture  Setup Time   Final    GRAM NEGATIVE RODS IN BOTH AEROBIC AND ANAEROBIC BOTTLES Organism ID to follow CRITICAL RESULT CALLED TO, READ BACK BY AND VERIFIED WITH: Sanda Klein, AT [id number] 03/28/21 D. VANHOOK Performed at [facility], [place] [address] [place], [place] [address]    Culture ESCHERICHIA COLI (A)  Final   Report Status 03/30/2021 FINAL  Final   Organism ID, Bacteria ESCHERICHIA COLI  Final      Susceptibility   Escherichia coli - MIC*    AMPICILLIN 8 SENSITIVE Sensitive     CEFAZOLIN <=4 SENSITIVE Sensitive     CEFEPIME <=0.12 SENSITIVE Sensitive     CEFTAZIDIME <=1 SENSITIVE Sensitive     CEFTRIAXONE <=0.25 SENSITIVE Sensitive     CIPROFLOXACIN <=0.25 SENSITIVE Sensitive     GENTAMICIN <=1 SENSITIVE Sensitive     IMIPENEM <=0.25 SENSITIVE Sensitive     TRIMETH/SULFA <=20 SENSITIVE Sensitive      AMPICILLIN/SULBACTAM 4 SENSITIVE Sensitive     PIP/TAZO <=4 SENSITIVE Sensitive     * ESCHERICHIA COLI  Blood Culture ID Panel (Reflexed)     Status: Abnormal   Collection Time: 03/27/21  4:56 PM  Result Value Ref Range Status   Enterococcus faecalis NOT DETECTED NOT DETECTED Final   Enterococcus Faecium NOT DETECTED NOT DETECTED Final   Listeria monocytogenes NOT DETECTED NOT DETECTED Final  Staphylococcus species NOT DETECTED NOT DETECTED Final   Staphylococcus aureus (BCID) NOT DETECTED NOT DETECTED Final   Staphylococcus epidermidis NOT DETECTED NOT DETECTED Final   Staphylococcus lugdunensis NOT DETECTED NOT DETECTED Final   Streptococcus species NOT DETECTED NOT DETECTED Final   Streptococcus agalactiae NOT DETECTED NOT DETECTED Final   Streptococcus pneumoniae NOT DETECTED NOT DETECTED Final   Streptococcus pyogenes NOT DETECTED NOT DETECTED Final   A.calcoaceticus-baumannii NOT DETECTED NOT DETECTED Final   Bacteroides fragilis NOT DETECTED NOT DETECTED Final   Enterobacterales DETECTED (A) NOT DETECTED Final    Comment: Enterobacterales represent a large order of gram negative bacteria, not a single organism. CRITICAL RESULT CALLED TO, READ BACK BY AND VERIFIED WITH: Sanda Klein, AT [phone] 03/28/21 D. VANHOOK    Enterobacter cloacae complex NOT DETECTED NOT DETECTED Final   Escherichia coli DETECTED (A) NOT DETECTED Final    Comment: CRITICAL RESULT CALLED TO, READ BACK BY AND VERIFIED WITH: Sanda Klein, AT [id number] 03/28/21 D. VANHOOK    Klebsiella aerogenes NOT DETECTED NOT DETECTED Final   Klebsiella oxytoca NOT DETECTED NOT DETECTED Final   Klebsiella pneumoniae NOT DETECTED NOT DETECTED Final   Proteus species NOT DETECTED NOT DETECTED Final   Salmonella species NOT DETECTED NOT DETECTED Final   Serratia marcescens NOT DETECTED NOT DETECTED Final   Haemophilus influenzae NOT DETECTED NOT DETECTED Final   Neisseria meningitidis NOT DETECTED NOT DETECTED Final    Pseudomonas aeruginosa NOT DETECTED NOT DETECTED Final   Stenotrophomonas maltophilia NOT DETECTED NOT DETECTED Final   Candida albicans NOT DETECTED NOT DETECTED Final   Candida auris NOT DETECTED NOT DETECTED Final   Candida glabrata NOT DETECTED NOT DETECTED Final   Candida krusei NOT DETECTED NOT DETECTED Final   Candida parapsilosis NOT DETECTED NOT DETECTED Final   Candida tropicalis NOT DETECTED NOT DETECTED Final   Cryptococcus neoformans/gattii NOT DETECTED NOT DETECTED Final   CTX-M ESBL NOT DETECTED NOT DETECTED Final   Carbapenem resistance IMP NOT DETECTED NOT DETECTED Final   Carbapenem resistance KPC NOT DETECTED NOT DETECTED Final   Carbapenem resistance NDM NOT DETECTED NOT DETECTED Final   Carbapenem resist OXA 48 LIKE NOT DETECTED NOT DETECTED Final   Carbapenem resistance VIM NOT DETECTED NOT DETECTED Final    Comment: Performed at [facility], [address], [place], [place] [address]  Culture, blood (routine x 2)     Status: Abnormal   Collection Time: 03/27/21  5:15 PM   Specimen: BLOOD  Result Value Ref Range Status   Specimen Description   Final    BLOOD LEFT ANTECUBITAL Performed at [facility] [address] [place], [place] [address]    Special Requests   Final    BOTTLES DRAWN AEROBIC AND ANAEROBIC Blood Culture adequate volume Performed at [facility] [address] [place], [place] [address]    Culture  Setup Time   Final    GRAM NEGATIVE RODS IN BOTH AEROBIC AND ANAEROBIC BOTTLES CRITICAL VALUE NOTED.  VALUE IS CONSISTENT WITH PREVIOUSLY REPORTED AND CALLED VALUE.    Culture (A)  Final    ESCHERICHIA COLI SUSCEPTIBILITIES PERFORMED ON PREVIOUS CULTURE WITHIN THE LAST 5 DAYS. Performed at [facility], [place] [address], [place], [place] [address]    Report Status 03/30/2021 FINAL  Final  Resp Panel by RT-PCR (Flu A&B, Covid) Nasopharyngeal Swab     Status: None   Collection Time: 03/27/21   5:58 PM  Specimen: Nasopharyngeal Swab; Nasopharyngeal(NP) swabs in vial transport medium  Result Value Ref Range Status   SARS Coronavirus 2 by RT PCR NEGATIVE NEGATIVE Final    Comment: (NOTE) SARS-CoV-2 target nucleic acids are NOT DETECTED.  The SARS-CoV-2 RNA is generally detectable in upper respiratory specimens during the acute phase of infection. The lowest concentration of SARS-CoV-2 viral copies this assay can detect is 138 copies/mL. A negative result does not preclude SARS-Cov-2 infection and should not be used as the sole basis for treatment or other patient management decisions. A negative result may occur with  improper specimen collection/handling, submission of specimen other than nasopharyngeal swab, presence of viral mutation(s) within the areas targeted by this assay, and inadequate number of viral copies(<138 copies/mL). A negative result must be combined with clinical observations, patient history, and epidemiological information. The expected result is Negative.  Fact Sheet for Patients:  EntrepreneurPulse.com.au  Fact Sheet for Healthcare Providers:  IncredibleEmployment.be  This test is no t yet approved or cleared by the [place] FDA and  has been authorized for detection and/or diagnosis of SARS-CoV-2 by FDA under an Emergency Use Authorization (EUA). This EUA will remain  in effect (meaning this test can be used) for the duration of the COVID-19 declaration under Section 564(b)(1) of the Act, 21 U.S.C.section 360bbb-3(b)(1), unless the authorization is terminated  or revoked sooner.       Influenza A by PCR NEGATIVE NEGATIVE Final   Influenza B by PCR NEGATIVE NEGATIVE Final    Comment: (NOTE) The Xpert Xpress SARS-CoV-2/FLU/RSV plus assay is intended as an aid in the diagnosis of influenza from Nasopharyngeal swab specimens and should not be used as a sole basis for treatment. Nasal washings and aspirates  are unacceptable for Xpert Xpress SARS-CoV-2/FLU/RSV testing.  Fact Sheet for Patients: EntrepreneurPulse.com.au  Fact Sheet for Healthcare Providers: IncredibleEmployment.be  This test is not yet approved or cleared by the [place] FDA and has been authorized for detection and/or diagnosis of SARS-CoV-2 by FDA under an Emergency Use Authorization (EUA). This EUA will remain in effect (meaning this test can be used) for the duration of the COVID-19 declaration under Section 564(b)(1) of the Act, 21 U.S.C. section 360bbb-3(b)(1), unless the authorization is terminated or revoked.  Performed at [facility], [place] [address] [place], [place] [address]      Medications:    atorvastatin  10 mg Oral Daily    morphine injection  3 mg Intravenous Once   pantoprazole (PROTONIX) IV  40 mg Intravenous Q12H   Continuous Infusions:  cefTRIAXone (ROCEPHIN)  IV Stopped (03/29/21 1900)   dextrose 5 % and 0.45% NaCl 75 mL/hr at 03/30/21 0734   lactated ringers Stopped (03/30/21 0700)      LOS: 2 days   [name]  [facility]  03/30/2021, 9:20 AM       ``

## 2021-03-30 NOTE — Anesthesia Preprocedure Evaluation (Signed)
Anesthesia Evaluation  Patient identified by MRN, date of birth, ID band Patient awake    Reviewed: Allergy & Precautions, NPO status , Patient's Chart, lab work & pertinent test results  Airway Mallampati: II  TM Distance: <3 FB Neck ROM: Full    Dental no notable dental hx.    Pulmonary neg pulmonary ROS,    Pulmonary exam normal breath sounds clear to auscultation       Cardiovascular negative cardio ROS Normal cardiovascular exam Rhythm:Regular Rate:Normal     Neuro/Psych negative neurological ROS  negative psych ROS   GI/Hepatic Neg liver ROS, GERD  ,  Endo/Other  negative endocrine ROS  Renal/GU negative Renal ROS  negative genitourinary   Musculoskeletal negative musculoskeletal ROS (+)   Abdominal   Peds negative pediatric ROS (+)  Hematology negative hematology ROS (+)   Anesthesia Other Findings   Reproductive/Obstetrics negative OB ROS                             Anesthesia Physical Anesthesia Plan  ASA: 2  Anesthesia Plan: General   Post-op Pain Management:    Induction: Intravenous  PONV Risk Score and Plan: 2 and Ondansetron, Dexamethasone and Treatment may vary due to age or medical condition  Airway Management Planned: Oral ETT  Additional Equipment:   Intra-op Plan:   Post-operative Plan: Extubation in OR  Informed Consent: I have reviewed the patients History and Physical, chart, labs and discussed the procedure including the risks, benefits and alternatives for the proposed anesthesia with the patient or authorized representative who has indicated his/her understanding and acceptance.     Dental advisory given  Plan Discussed with: CRNA and Surgeon  Anesthesia Plan Comments:         Anesthesia Quick Evaluation

## 2021-03-30 NOTE — Anesthesia Postprocedure Evaluation (Signed)
Anesthesia Post Note  Patient: Juan Snow FORGET  Procedure(s) Performed: LAPAROSCOPIC CHOLECYSTECTOMY WITH INTRAOPERATIVE CHOLANGIOGRAM (Abdomen)     Patient location during evaluation: PACU Anesthesia Type: General Level of consciousness: awake and alert, oriented and patient cooperative Pain management: pain level controlled Vital Signs Assessment: post-procedure vital signs reviewed and stable Respiratory status: spontaneous breathing, nonlabored ventilation and respiratory function stable Cardiovascular status: blood pressure returned to baseline and stable Postop Assessment: no apparent nausea or vomiting Anesthetic complications: no   No notable events documented.  Last Vitals:  Vitals:   03/30/21 1445 03/30/21 1500  BP: (!) 139/93 (!) 146/69  Pulse: (!) 57 (!) 56  Resp: 17 16  Temp:    SpO2: 94% 94%    Last Pain:  Vitals:   03/30/21 1445  TempSrc:   PainSc: [place]

## 2021-03-30 NOTE — Transfer of Care (Signed)
Immediate Anesthesia Transfer of Care Note  Patient: Juan Snow  Procedure(s) Performed: LAPAROSCOPIC CHOLECYSTECTOMY WITH INTRAOPERATIVE CHOLANGIOGRAM (Abdomen)  Patient Location: PACU  Anesthesia Type:General  Level of Consciousness: awake, alert  and oriented  Airway & Oxygen Therapy: Patient Spontanous Breathing and Patient connected to face mask oxygen  Post-op Assessment: Report given to RN and Post -op Vital signs reviewed and stable  Post vital signs: Reviewed and stable  Last Vitals:  Vitals Value Taken Time  BP 107/57 03/30/21 1412  Temp    Pulse 52 03/30/21 1411  Resp    SpO2 80 % 03/30/21 1411  Vitals shown include unvalidated device data.  Last Pain:  Vitals:   03/30/21 1132  TempSrc:   PainSc: 0-No pain         Complications: No notable events documented.

## 2021-03-30 NOTE — Progress Notes (Signed)
Subjective: No acute events.  Objective: Vital signs in last 24 hours: Temp:  [98.7 F (37.1 C)-100.4 F (38 C)] 99.1 F (37.3 C) (01/27 0309) Pulse Rate:  [56-71] 56 (01/27 1131) Resp:  [13-17] 17 (01/27 1131) BP: (116-147)/(76-120) 147/120 (01/27 1131) SpO2:  [96 %-97 %] 97 % (01/27 1131) Weight:  [85.3 kg] 85.3 kg (01/27 1132) Last BM Date: 03/29/21  Intake/Output from previous day: 01/26 0701 - 01/27 0700 In: 1925.3 [I.V.:1725.3; IV Piggyback:200] Out: -  Intake/Output this shift: Total I/O In: 527.4 [I.V.:527.4] Out: -   General appearance: alert and no distress GI: epigastric tenderness  Lab Results: Recent Labs    03/28/21 1350 03/29/21 0358 03/30/21 0420  WBC 7.8 5.5 5.6  HGB 14.4 14.4 14.1  HCT 43.6 42.9 41.3  PLT 112* 134* 146*   BMET Recent Labs    03/28/21 1243 03/29/21 0358 03/30/21 0420  NA 137 139 139  K 4.0 4.0 3.7  CL 107 108 108  CO2 22 24 23   GLUCOSE 117* 107* 109*  BUN 20 22 21   CREATININE 1.41* 1.52* <0.30*  CALCIUM 8.5* 8.6* 8.6*   LFT Recent Labs    03/27/21 2311 03/28/21 0405 03/30/21 0420  PROT  --    < > 5.9*  ALBUMIN  --    < > 2.9*  AST  --    < > 14*  ALT  --    < > 38  ALKPHOS  --    < > 79  BILITOT 5.4*   < > 1.6*  BILIDIR 3.1*  --   --   IBILI 2.3*  --   --    < > = values in this interval not displayed.   PT/INR No results for input(s): LABPROT, INR in the last 72 hours. Hepatitis Panel Recent Labs    03/29/21 0358  HEPBSAG NON REACTIVE  HCVAB NON REACTIVE  HEPAIGM NON REACTIVE  HEPBIGM NON REACTIVE   C-Diff No results for input(s): CDIFFTOX in the last 72 hours. Fecal Lactopherrin No results for input(s): FECLLACTOFRN in the last 72 hours.  Studies/Results: MR 3D Recon At Scanner  Addendum Date: 03/30/2021   ADDENDUM REPORT: 03/30/2021 09:53 ADDENDUM: Dictation error occurred in the findings and impression section of the report incorrectly states that there are 2 large choledocholithiasis the  report should state: There are 2 large CHOLELITHIASIS without findings of acute cholecystitis, biliary ductal dilation or choledocholithiasis. Electronically Signed   By: Dahlia Bailiff M.D.   On: 03/30/2021 09:53   Result Date: 03/30/2021 CLINICAL DATA:  Cholelithiasis. Right upper quadrant abdominal pain. EXAM: MRI ABDOMEN WITHOUT AND WITH CONTRAST (INCLUDING MRCP) TECHNIQUE: Multiplanar multisequence MR imaging of the abdomen was performed both before and after the administration of intravenous contrast. Heavily T2-weighted images of the biliary and pancreatic ducts were obtained, and three-dimensional MRCP images were rendered by post processing. CONTRAST:  37mL GADAVIST GADOBUTROL 1 MMOL/ML IV SOLN COMPARISON:  Ultrasound March 27, 2021, CT June 26, 2015, and nuclear medicine HIDA scan March 28, 2021 FINDINGS: Lower chest: Heterogeneous signal in the lung bases, favor atelectasis. Hepatobiliary: No hepatic steatosis. Periportal edema. Scattered tiny hepatic cysts. T2 hyperintense 18 mm segment II hepatic lesion on image 18/5 which demonstrates nodular arterial enhancement with filling in of the lesion with contrast on delayed postcontrast imaging similar in intensity to background aorta, consistent with a benign hepatic hemangioma. Two large choledocholithiasis in a nondilated gallbladder without gallbladder wall thickening or pericholecystic fluid. No biliary ductal dilation and no  choledocholithiasis. Pancreas: Intrinsic T1 signal of the pancreatic parenchyma is within normal limits. There is homogeneous enhancement of the pancreas postcontrast administration. No pancreatic ductal dilation. No cystic or solid hyperenhancing pancreatic lesions identified. Spleen:  Within normal limits in size and appearance. Adrenals/Urinary Tract: Bilateral adrenal glands appear normal. No hydronephrosis. No solid enhancing renal mass. Stomach/Bowel: Moderate-sized hiatal hernia. No evidence of acute bowel inflammation  or pathologic dilation of loops of small or large bowel in the abdomen. Colonic diverticulosis without findings of acute diverticulitis. Vascular/Lymphatic: No abdominal aortic aneurysm. The portal, splenic and superior mesenteric veins are patent. No pathologically enlarged abdominal lymph nodes. Other:  No abdominal ascites. Musculoskeletal: No suspicious bone lesions identified. IMPRESSION: 1. Two large choledocholithiasis, without findings of acute cholecystitis, biliary ductal dilation or choledocholithiasis. 2. Periportal edema, nonspecific but in correlation with findings recent HIDA scan most likely reflect hepatitis. 3. Moderate-sized hiatal hernia. 4. Heterogeneous signal in the lung bases, favor atelectasis. Electronically Signed: By: Dahlia Bailiff M.D. On: 03/30/2021 07:53   MR ABDOMEN MRCP W WO CONTAST  Addendum Date: 03/30/2021   ADDENDUM REPORT: 03/30/2021 09:53 ADDENDUM: Dictation error occurred in the findings and impression section of the report incorrectly states that there are 2 large choledocholithiasis the report should state: There are 2 large CHOLELITHIASIS without findings of acute cholecystitis, biliary ductal dilation or choledocholithiasis. Electronically Signed   By: Dahlia Bailiff M.D.   On: 03/30/2021 09:53   Result Date: 03/30/2021 CLINICAL DATA:  Cholelithiasis. Right upper quadrant abdominal pain. EXAM: MRI ABDOMEN WITHOUT AND WITH CONTRAST (INCLUDING MRCP) TECHNIQUE: Multiplanar multisequence MR imaging of the abdomen was performed both before and after the administration of intravenous contrast. Heavily T2-weighted images of the biliary and pancreatic ducts were obtained, and three-dimensional MRCP images were rendered by post processing. CONTRAST:  16mL GADAVIST GADOBUTROL 1 MMOL/ML IV SOLN COMPARISON:  Ultrasound March 27, 2021, CT June 26, 2015, and nuclear medicine HIDA scan March 28, 2021 FINDINGS: Lower chest: Heterogeneous signal in the lung bases, favor  atelectasis. Hepatobiliary: No hepatic steatosis. Periportal edema. Scattered tiny hepatic cysts. T2 hyperintense 18 mm segment II hepatic lesion on image 18/5 which demonstrates nodular arterial enhancement with filling in of the lesion with contrast on delayed postcontrast imaging similar in intensity to background aorta, consistent with a benign hepatic hemangioma. Two large choledocholithiasis in a nondilated gallbladder without gallbladder wall thickening or pericholecystic fluid. No biliary ductal dilation and no choledocholithiasis. Pancreas: Intrinsic T1 signal of the pancreatic parenchyma is within normal limits. There is homogeneous enhancement of the pancreas postcontrast administration. No pancreatic ductal dilation. No cystic or solid hyperenhancing pancreatic lesions identified. Spleen:  Within normal limits in size and appearance. Adrenals/Urinary Tract: Bilateral adrenal glands appear normal. No hydronephrosis. No solid enhancing renal mass. Stomach/Bowel: Moderate-sized hiatal hernia. No evidence of acute bowel inflammation or pathologic dilation of loops of small or large bowel in the abdomen. Colonic diverticulosis without findings of acute diverticulitis. Vascular/Lymphatic: No abdominal aortic aneurysm. The portal, splenic and superior mesenteric veins are patent. No pathologically enlarged abdominal lymph nodes. Other:  No abdominal ascites. Musculoskeletal: No suspicious bone lesions identified. IMPRESSION: 1. Two large choledocholithiasis, without findings of acute cholecystitis, biliary ductal dilation or choledocholithiasis. 2. Periportal edema, nonspecific but in correlation with findings recent HIDA scan most likely reflect hepatitis. 3. Moderate-sized hiatal hernia. 4. Heterogeneous signal in the lung bases, favor atelectasis. Electronically Signed: By: Dahlia Bailiff M.D. On: 03/30/2021 07:53    Medications: Scheduled:  [MAR Hold] atorvastatin  10 mg  Oral Daily   [MAR Hold]  enoxaparin (LOVENOX) injection  40 mg Subcutaneous Q24H   [MAR Hold]  morphine injection  3 mg Intravenous Once   [MAR Hold] pantoprazole (PROTONIX) IV  40 mg Intravenous Q12H   Continuous:  [MAR Hold] ampicillin-sulbactam (UNASYN) IV     dextrose 5 % and 0.45% NaCl 10 mL/hr at 03/30/21 0942   lactated ringers Stopped (03/30/21 0700)   cefTRIAXone (ROCEPHIN) IVPB 2 gram/100 mL NS (Mini-Bag Plus)      Assessment/Plan: 1) Cholelithiasis. 2) Sepsis.   The MRCP was negative for choledocholithiasis.  This was confirmed with Dr. Nance Pew, Radiology.  There was transcriptional error.    Plan: 1) Lap chole per Surgery. 2) Colonoscopy can be performed on an outpatient basis.  LOS: 2 days   Juan Snow D 03/30/2021, 1:10 PM

## 2021-03-30 NOTE — Interval H&P Note (Signed)
History and Physical Interval Note:  03/30/2021 12:07 PM  Juan Snow  has presented today for surgery, with the diagnosis of GALLSTONES.  The various methods of treatment have been discussed with the patient and family. After consideration of risks, benefits and other options for treatment, the patient has consented to    Procedure(s): LAPAROSCOPIC CHOLECYSTECTOMY WITH INTRAOPERATIVE CHOLANGIOGRAM (N/A) as a surgical intervention.    The patient's history has been reviewed, patient examined, no change in status, stable for surgery.  I have reviewed the patient's chart and labs.  Questions were answered to the patient's satisfaction.    Armandina Gemma, [facility] [place] practice Office: [place]

## 2021-03-31 ENCOUNTER — Other Ambulatory Visit (HOSPITAL_COMMUNITY): Payer: Self-pay

## 2021-03-31 ENCOUNTER — Encounter (HOSPITAL_COMMUNITY): Payer: Self-pay | Admitting: Surgery

## 2021-03-31 LAB — CBC
HCT: 42.1 % (ref 39.0–52.0)
Hemoglobin: 14.1 g/dL (ref 13.0–17.0)
MCH: 30.5 pg (ref 26.0–34.0)
MCHC: 33.5 g/dL (ref 30.0–36.0)
MCV: 90.9 fL (ref 80.0–100.0)
Platelets: 168 10*3/uL (ref 150–400)
RBC: 4.63 MIL/uL (ref 4.22–5.81)
RDW: 13.2 % (ref 11.5–15.5)
WBC: 7 10*3/uL (ref 4.0–10.5)
nRBC: 0 % (ref 0.0–0.2)

## 2021-03-31 LAB — COMPREHENSIVE METABOLIC PANEL
ALT: 38 U/L (ref 0–44)
AST: 19 U/L (ref 15–41)
Albumin: 2.8 g/dL — ABNORMAL LOW (ref 3.5–5.0)
Alkaline Phosphatase: 74 U/L (ref 38–126)
Anion gap: 6 (ref 5–15)
BUN: 17 mg/dL (ref 8–23)
CO2: 25 mmol/L (ref 22–32)
Calcium: 8.6 mg/dL — ABNORMAL LOW (ref 8.9–10.3)
Chloride: 107 mmol/L (ref 98–111)
Creatinine, Ser: 1.27 mg/dL — ABNORMAL HIGH (ref 0.61–1.24)
GFR, Estimated: >60 mL/min (ref >60)
Glucose, Bld: 123 mg/dL — ABNORMAL HIGH (ref 70–99)
Potassium: 4.4 mmol/L (ref 3.5–5.1)
Sodium: 138 mmol/L (ref 135–145)
Total Bilirubin: 0.8 mg/dL (ref 0.3–1.2)
Total Protein: 5.9 g/dL — ABNORMAL LOW (ref 6.5–8.1)

## 2021-03-31 MED ORDER — PANTOPRAZOLE SODIUM 40 MG PO TBEC: 40.0000 mg | DELAYED_RELEASE_TABLET | Freq: Every day | ORAL | 1 refills | Status: DC

## 2021-03-31 MED ORDER — PANTOPRAZOLE SODIUM 40 MG PO TBEC
40.0000 mg | DELAYED_RELEASE_TABLET | Freq: Every day | ORAL | 1 refills | Status: DC
  Filled 2021-03-31: qty 30, 30d supply, fill #0

## 2021-03-31 MED ORDER — AMOXICILLIN-POT CLAVULANATE 875-125 MG PO TABS: 1.0000 | ORAL_TABLET | Freq: Two times a day (BID) | ORAL | 0 refills | Status: DC

## 2021-03-31 MED ORDER — AMOXICILLIN-POT CLAVULANATE 875-125 MG PO TABS
ORAL_TABLET | ORAL | 0 refills | Status: DC
  Filled 2021-03-31: qty 12, 6d supply, fill #0

## 2021-03-31 MED ORDER — AMOXICILLIN-POT CLAVULANATE 875-125 MG PO TABS: 1.0000 | ORAL_TABLET | Freq: Two times a day (BID) | ORAL | Status: DC

## 2021-03-31 MED ORDER — AMOXICILLIN-POT CLAVULANATE 875-125 MG PO TABS: 1.0000 | ORAL_TABLET | Freq: Two times a day (BID) | ORAL | 0 refills | Status: AC

## 2021-03-31 NOTE — Progress Notes (Signed)
Discharge instructions given. Patient verbalized understanding and all questions were answered.  ?

## 2021-03-31 NOTE — Discharge Summary (Signed)
Physician Discharge Summary  Juan Snow ZHG:[id number] DOB: 1955/06/14 DOA: 03/27/2021  PCP: Vernie Shanks, MD  Admit date: 03/27/2021 Discharge date: 03/31/2021  Admitted From: Home Disposition:  Hom  Recommendations for Outpatient Follow-up:  Follow up with Surgery in 1-2 weeks Please obtain BMP/CBC in one week   Home Health:No Equipment/Devices:none  Discharge Condition:Stable CODE STATUS:Full Diet recommendation: Heart Healthy   Brief/Interim Summary: 66 y.o. male past medical history significant for hyperlipidemia and GERD comes in with abdominal pain fever and chills admitted for sepsis started empirically on antibiotics    Discharge Diagnoses:  Principal Problem:   Sepsis ([place]) Active Problems:   AKI (acute kidney injury) ([place])   Hypomagnesemia   Chest pain   Prolonged QT interval   Gall bladder stones   Aspiration pneumonia ([place])   Severe sepsis ([place])  Severe sepsis/E. coli bacteremia: Blood cultures were done that showed E. coli on admission he was started on IV Rocephin which was transitioned to oral Augmentin as an outpatient. MRCP showed 2 large stones in the gallbladder surgery was consulted perform a lap chole on 03/30/2021. His diet was advanced which he tolerated he will continue Augmentin for 6 days as an outpatient.  Acute kidney injury: With a baseline creatinine 1.1 likely prerenal azotemia resolved with fluid resuscitation.  Hypokalemia/hypomagnesemia: Both repleted orally now resolved.  Prolonged QTC: Likely due to electrolyte imbalance now resolved.   Discharge Instructions  Discharge Instructions     Diet - low sodium heart healthy   Complete by: As directed    Increase activity slowly   Complete by: As directed    No wound care   Complete by: As directed       Allergies as of 03/31/2021   No Known Allergies      Medication List     TAKE these medications    acetaminophen 500 MG tablet Commonly known as:  TYLENOL Take 500 mg by mouth every 6 (six) hours as needed (for pain).   amoxicillin-clavulanate 875-125 MG tablet Commonly known as: AUGMENTIN Take 1 tablet by mouth every 12 (twelve) hours for 6 days.   atorvastatin 10 MG tablet Commonly known as: LIPITOR TAKE 1 TABLET BY MOUTH ONCE A DAY   atorvastatin 10 MG tablet Commonly known as: LIPITOR Take 1 tablet by mouth once a day   atorvastatin 10 MG tablet Commonly known as: LIPITOR Take 1 tablet (10 mg total) by mouth daily.   atorvastatin 10 MG tablet Commonly known as: LIPITOR Take 1 tablet (10 mg total) by mouth daily.   CO Q-10 PO Take 1 capsule by mouth daily.   esomeprazole 20 MG capsule Commonly known as: NEXIUM Take 20 mg by mouth daily before breakfast.   HYDROcodone-acetaminophen 7.5-325 MG tablet Commonly known as: NORCO Take 1 tablet by mouth every 4 (four) hours as needed for severe pain (pain score 7-10).   ibuprofen 200 MG tablet Commonly known as: ADVIL Take 600 mg by mouth every 6 (six) hours as needed (for pain).   methocarbamol 500 MG tablet Commonly known as: ROBAXIN Take 1 tablet (500 mg total) by mouth every 6 (six) hours as needed for muscle spasms.   rivaroxaban 10 MG Tabs tablet Commonly known as: XARELTO Take 1 tablet (10 mg total) by mouth daily with breakfast.   traMADol 50 MG tablet Commonly known as: ULTRAM Take 1-2 tablets (50-100 mg total) by mouth every 6 (six) hours as needed for moderate pain.   vitamin C 500 MG tablet Commonly  known as: ASCORBIC ACID Take 500-1,000 mg by mouth daily.   Vitamin D3 50 MCG (2000 UT) Tabs Take 8,000 Units by mouth daily.   VITAMIN K2 PO Take 1 tablet by mouth daily.        Follow-up [facility], [place]. Go on 04/19/2021.   Specialty: General Surgery Why: Your appointment is 04/19/21 at 2:45 pm Please arrive 30 minutes prior to your appointment to check in and fill out paperwork. Bring photo ID and insurance  information. Contact information: [address] [place] [phone]               No Known Allergies  Consultations: Urology General surgery   Procedures/Studies: DG Cholangiogram Operative  Result Date: 03/30/2021 CLINICAL DATA:  Intraoperative cholangiogram EXAM: INTRAOPERATIVE CHOLANGIOGRAM TECHNIQUE: Cholangiographic images from the C-arm fluoroscopic device were submitted for interpretation post-operatively. Please see the procedural report for the amount of contrast and the fluoroscopy time utilized. COMPARISON:  None. FINDINGS: A single fluoroscopic cine loop and spot images were submitted for review taken during intraoperative cholangiogram. Images demonstrate surgical instrumentation and clips overlying the gallbladder fossa. Contrast injection through the cystic duct opacifies both the intrahepatic and extrahepatic biliary tree. The ducts are normal in caliber without obvious filling defect. There is prompt passage of contrast material through the ampulla into the small bowel. IMPRESSION: Intraoperative cholangiogram as described. Refer to operative report for full details. Electronically Signed   By: Albin Felling M.D.   On: 03/30/2021 13:52   NM Hepatobiliary Liver Func  Result Date: 03/28/2021 CLINICAL DATA:  Elevated bilirubin. EXAM: NUCLEAR MEDICINE HEPATOBILIARY IMAGING TECHNIQUE: Sequential images of the abdomen were obtained out to 60 minutes following intravenous administration of radiopharmaceutical. RADIOPHARMACEUTICALS:  7.6 mCi Tc-45m  Choletec IV COMPARISON:  Ultrasound 03/27/2021 FINDINGS: Uniform uptake of radiotracer with liver. Delayed clearance of radiotracer from the blood pool consistent with elevated bilirubin. There is minimal excretion of radiotracer from the liver. Faint counts are noted in the common bile duct and proximal small at 60 minutes IV morphine was administered to augment filling of the  gallbladder. Gallbladder does not fill after morphine administration; however, there is little radiotracer activity excreted into the bowel. The majority of radiotracer remains liver after 90 minutes of imaging. IMPRESSION: 1. Poor excretion of radiotracer from the liver suggest hepatic dysfunction such as hepatitis. 2. Counts are evident, albeit faintly, within the common bile duct and bowel consistent with patent common bile duct. 3. Gallbladder fails to fill after morphine augmentation; however, the sensitivity exam is lessened due to the poor of excretion radiotracer from the liver Electronically Signed   By: Suzy Bouchard M.D.   On: 03/28/2021 13:07   MR 3D Recon At Scanner  Addendum Date: 03/30/2021   ADDENDUM REPORT: 03/30/2021 09:53 ADDENDUM: Dictation error occurred in the findings and impression section of the report incorrectly states that there are 2 large choledocholithiasis the report should state: There are 2 large CHOLELITHIASIS without findings of acute cholecystitis, biliary ductal dilation or choledocholithiasis. Electronically Signed   By: Dahlia Bailiff M.D.   On: 03/30/2021 09:53   Result Date: 03/30/2021 CLINICAL DATA:  Cholelithiasis. Right upper quadrant abdominal pain. EXAM: MRI ABDOMEN WITHOUT AND WITH CONTRAST (INCLUDING MRCP) TECHNIQUE: Multiplanar multisequence MR imaging of the abdomen was performed both before and after the administration of intravenous contrast. Heavily T2-weighted images of the biliary and pancreatic ducts were obtained, and three-dimensional MRCP images were rendered by  post processing. CONTRAST:  49mL GADAVIST GADOBUTROL 1 MMOL/ML IV SOLN COMPARISON:  Ultrasound March 27, 2021, CT June 26, 2015, and nuclear medicine HIDA scan March 28, 2021 FINDINGS: Lower chest: Heterogeneous signal in the lung bases, favor atelectasis. Hepatobiliary: No hepatic steatosis. Periportal edema. Scattered tiny hepatic cysts. T2 hyperintense 18 mm segment II hepatic lesion  on image 18/5 which demonstrates nodular arterial enhancement with filling in of the lesion with contrast on delayed postcontrast imaging similar in intensity to background aorta, consistent with a benign hepatic hemangioma. Two large choledocholithiasis in a nondilated gallbladder without gallbladder wall thickening or pericholecystic fluid. No biliary ductal dilation and no choledocholithiasis. Pancreas: Intrinsic T1 signal of the pancreatic parenchyma is within normal limits. There is homogeneous enhancement of the pancreas postcontrast administration. No pancreatic ductal dilation. No cystic or solid hyperenhancing pancreatic lesions identified. Spleen:  Within normal limits in size and appearance. Adrenals/Urinary Tract: Bilateral adrenal glands appear normal. No hydronephrosis. No solid enhancing renal mass. Stomach/Bowel: Moderate-sized hiatal hernia. No evidence of acute bowel inflammation or pathologic dilation of loops of small or large bowel in the abdomen. Colonic diverticulosis without findings of acute diverticulitis. Vascular/Lymphatic: No abdominal aortic aneurysm. The portal, splenic and superior mesenteric veins are patent. No pathologically enlarged abdominal lymph nodes. Other:  No abdominal ascites. Musculoskeletal: No suspicious bone lesions identified. IMPRESSION: 1. Two large choledocholithiasis, without findings of acute cholecystitis, biliary ductal dilation or choledocholithiasis. 2. Periportal edema, nonspecific but in correlation with findings recent HIDA scan most likely reflect hepatitis. 3. Moderate-sized hiatal hernia. 4. Heterogeneous signal in the lung bases, favor atelectasis. Electronically Signed: By: Dahlia Bailiff M.D. On: 03/30/2021 07:53   DG Chest Port 1 View  Result Date: 03/27/2021 CLINICAL DATA:  Epigastric pain, vomiting EXAM: PORTABLE CHEST 1 VIEW COMPARISON:  None. FINDINGS: Cardiac size is within normal limits. There are no signs of pulmonary edema. There are  linear densities in the right lower lung fields. There is no pleural effusion or pneumothorax. IMPRESSION: Linear densities in right lower lung fields suggest scarring or subsegmental atelectasis/pneumonia. Electronically Signed   By: Elmer Picker M.D.   On: 03/27/2021 17:43   MR ABDOMEN MRCP W WO CONTAST  Addendum Date: 03/30/2021   ADDENDUM REPORT: 03/30/2021 09:53 ADDENDUM: Dictation error occurred in the findings and impression section of the report incorrectly states that there are 2 large choledocholithiasis the report should state: There are 2 large CHOLELITHIASIS without findings of acute cholecystitis, biliary ductal dilation or choledocholithiasis. Electronically Signed   By: Dahlia Bailiff M.D.   On: 03/30/2021 09:53   Result Date: 03/30/2021 CLINICAL DATA:  Cholelithiasis. Right upper quadrant abdominal pain. EXAM: MRI ABDOMEN WITHOUT AND WITH CONTRAST (INCLUDING MRCP) TECHNIQUE: Multiplanar multisequence MR imaging of the abdomen was performed both before and after the administration of intravenous contrast. Heavily T2-weighted images of the biliary and pancreatic ducts were obtained, and three-dimensional MRCP images were rendered by post processing. CONTRAST:  82mL GADAVIST GADOBUTROL 1 MMOL/ML IV SOLN COMPARISON:  Ultrasound March 27, 2021, CT June 26, 2015, and nuclear medicine HIDA scan March 28, 2021 FINDINGS: Lower chest: Heterogeneous signal in the lung bases, favor atelectasis. Hepatobiliary: No hepatic steatosis. Periportal edema. Scattered tiny hepatic cysts. T2 hyperintense 18 mm segment II hepatic lesion on image 18/5 which demonstrates nodular arterial enhancement with filling in of the lesion with contrast on delayed postcontrast imaging similar in intensity to background aorta, consistent with a benign hepatic hemangioma. Two large choledocholithiasis in a nondilated gallbladder without gallbladder wall thickening  or pericholecystic fluid. No biliary ductal dilation and  no choledocholithiasis. Pancreas: Intrinsic T1 signal of the pancreatic parenchyma is within normal limits. There is homogeneous enhancement of the pancreas postcontrast administration. No pancreatic ductal dilation. No cystic or solid hyperenhancing pancreatic lesions identified. Spleen:  Within normal limits in size and appearance. Adrenals/Urinary Tract: Bilateral adrenal glands appear normal. No hydronephrosis. No solid enhancing renal mass. Stomach/Bowel: Moderate-sized hiatal hernia. No evidence of acute bowel inflammation or pathologic dilation of loops of small or large bowel in the abdomen. Colonic diverticulosis without findings of acute diverticulitis. Vascular/Lymphatic: No abdominal aortic aneurysm. The portal, splenic and superior mesenteric veins are patent. No pathologically enlarged abdominal lymph nodes. Other:  No abdominal ascites. Musculoskeletal: No suspicious bone lesions identified. IMPRESSION: 1. Two large choledocholithiasis, without findings of acute cholecystitis, biliary ductal dilation or choledocholithiasis. 2. Periportal edema, nonspecific but in correlation with findings recent HIDA scan most likely reflect hepatitis. 3. Moderate-sized hiatal hernia. 4. Heterogeneous signal in the lung bases, favor atelectasis. Electronically Signed: By: Dahlia Bailiff M.D. On: 03/30/2021 07:53   US Abdomen Limited RUQ (LIVER/GB)  Result Date: 03/27/2021 CLINICAL DATA:  Abdominal pain EXAM: ULTRASOUND ABDOMEN LIMITED RIGHT UPPER QUADRANT COMPARISON:  None. FINDINGS: Gallbladder: There is 2 cm echogenic focus with acoustic shadowing in the neck of the gallbladder. Sludge is seen in the lumen of the gallbladder. Technologist did not observe any tenderness over the gallbladder. There is no fluid around the gallbladder. Common bile duct: Diameter: 2.3 mm Liver: No focal lesion identified. Within normal limits in parenchymal echogenicity. Portal vein is patent on color Doppler imaging with normal  direction of blood flow towards the liver. Other: None. IMPRESSION: There is 2 cm hyperechoic focus with acoustic shadowing in the neck of the gallbladder suggesting gallbladder stone. Sludge is seen in the lumen of the gallbladder. There are no sonographic signs of acute cholecystitis. There is no dilation of bile ducts. Electronically Signed   By: Elmer Picker M.D.   On: 03/27/2021 18:05   (Echo, Carotid, EGD, Colonoscopy, ERCP)    Subjective: No complaints  Discharge Exam: Vitals:   03/30/21 2044 03/31/21 0526  BP: 126/84 123/83  Pulse: (!) 56 (!) 51  Resp: 20 18  Temp: 97.9 F (36.6 C) 98 F (36.7 C)  SpO2: 98% 97%   Vitals:   03/30/21 1515 03/30/21 1536 03/30/21 2044 03/31/21 0526  BP: 124/70 (!) 146/94 126/84 123/83  Pulse: (!) 57 (!) 59 (!) 56 (!) 51  Resp: 15 17 20 18   Temp: 98 F (36.7 C) 98.6 F (37 C) 97.9 F (36.6 C) 98 F (36.7 C)  TempSrc:  Oral  Oral  SpO2: 94% 90% 98% 97%  Weight:      Height:        General: Pt is alert, awake, not in acute distress Cardiovascular: RRR, S1/S2 +, no rubs, no gallops Respiratory: CTA bilaterally, no wheezing, no rhonchi Abdominal: Soft, NT, ND, bowel sounds + Extremities: no edema, no cyanosis    The results of significant diagnostics from this hospitalization (including imaging, microbiology, ancillary and laboratory) are listed below for reference.     Microbiology: Recent Results (from the past 240 hour(s))  Culture, blood (routine x 2)     Status: Abnormal   Collection Time: 03/27/21  4:56 PM   Specimen: Right Antecubital; Blood  Result Value Ref Range Status   Specimen Description   Final    RIGHT ANTECUBITAL Performed at [facility]  [place], [place] [address]    Special Requests   Final    BOTTLES DRAWN AEROBIC AND ANAEROBIC Blood Culture adequate volume Performed at [facility] [address], [place], [place] [address]    Culture  Setup  Time   Final    GRAM NEGATIVE RODS IN BOTH AEROBIC AND ANAEROBIC BOTTLES Organism ID to follow CRITICAL RESULT CALLED TO, READ BACK BY AND VERIFIED WITH: Sanda Klein, AT [id number] 03/28/21 D. VANHOOK Performed at [facility], [place] [address] [place], [place] [address]    Culture ESCHERICHIA COLI (A)  Final   Report Status 03/30/2021 FINAL  Final   Organism ID, Bacteria ESCHERICHIA COLI  Final      Susceptibility   Escherichia coli - MIC*    AMPICILLIN 8 SENSITIVE Sensitive     CEFAZOLIN <=4 SENSITIVE Sensitive     CEFEPIME <=0.12 SENSITIVE Sensitive     CEFTAZIDIME <=1 SENSITIVE Sensitive     CEFTRIAXONE <=0.25 SENSITIVE Sensitive     CIPROFLOXACIN <=0.25 SENSITIVE Sensitive     GENTAMICIN <=1 SENSITIVE Sensitive     IMIPENEM <=0.25 SENSITIVE Sensitive     TRIMETH/SULFA <=20 SENSITIVE Sensitive     AMPICILLIN/SULBACTAM 4 SENSITIVE Sensitive     PIP/TAZO <=4 SENSITIVE Sensitive     * ESCHERICHIA COLI  Blood Culture ID Panel (Reflexed)     Status: Abnormal   Collection Time: 03/27/21  4:56 PM  Result Value Ref Range Status   Enterococcus faecalis NOT DETECTED NOT DETECTED Final   Enterococcus Faecium NOT DETECTED NOT DETECTED Final   Listeria monocytogenes NOT DETECTED NOT DETECTED Final   Staphylococcus species NOT DETECTED NOT DETECTED Final   Staphylococcus aureus (BCID) NOT DETECTED NOT DETECTED Final   Staphylococcus epidermidis NOT DETECTED NOT DETECTED Final   Staphylococcus lugdunensis NOT DETECTED NOT DETECTED Final   Streptococcus species NOT DETECTED NOT DETECTED Final   Streptococcus agalactiae NOT DETECTED NOT DETECTED Final   Streptococcus pneumoniae NOT DETECTED NOT DETECTED Final   Streptococcus pyogenes NOT DETECTED NOT DETECTED Final   A.calcoaceticus-baumannii NOT DETECTED NOT DETECTED Final   Bacteroides fragilis NOT DETECTED NOT DETECTED Final   Enterobacterales DETECTED (A) NOT DETECTED Final    Comment: Enterobacterales represent a large order of gram  negative bacteria, not a single organism. CRITICAL RESULT CALLED TO, READ BACK BY AND VERIFIED WITH: Sanda Klein, AT [id number] 03/28/21 D. VANHOOK    Enterobacter cloacae complex NOT DETECTED NOT DETECTED Final   Escherichia coli DETECTED (A) NOT DETECTED Final    Comment: CRITICAL RESULT CALLED TO, READ BACK BY AND VERIFIED WITH: Sanda Klein, AT [id number] 03/28/21 D. VANHOOK    Klebsiella aerogenes NOT DETECTED NOT DETECTED Final   Klebsiella oxytoca NOT DETECTED NOT DETECTED Final   Klebsiella pneumoniae NOT DETECTED NOT DETECTED Final   Proteus species NOT DETECTED NOT DETECTED Final   Salmonella species NOT DETECTED NOT DETECTED Final   Serratia marcescens NOT DETECTED NOT DETECTED Final   Haemophilus influenzae NOT DETECTED NOT DETECTED Final   Neisseria meningitidis NOT DETECTED NOT DETECTED Final   Pseudomonas aeruginosa NOT DETECTED NOT DETECTED Final   Stenotrophomonas maltophilia NOT DETECTED NOT DETECTED Final   Candida albicans NOT DETECTED NOT DETECTED Final   Candida auris NOT DETECTED NOT DETECTED Final   Candida glabrata NOT DETECTED NOT DETECTED Final   Candida krusei NOT DETECTED NOT DETECTED Final   Candida parapsilosis NOT DETECTED NOT DETECTED Final   Candida tropicalis NOT DETECTED NOT DETECTED Final  Cryptococcus neoformans/gattii NOT DETECTED NOT DETECTED Final   CTX-M ESBL NOT DETECTED NOT DETECTED Final   Carbapenem resistance IMP NOT DETECTED NOT DETECTED Final   Carbapenem resistance KPC NOT DETECTED NOT DETECTED Final   Carbapenem resistance NDM NOT DETECTED NOT DETECTED Final   Carbapenem resist OXA 48 LIKE NOT DETECTED NOT DETECTED Final   Carbapenem resistance VIM NOT DETECTED NOT DETECTED Final    Comment: Performed at [facility], [place] [address], [place], [place] [address]  Culture, blood (routine x 2)     Status: Abnormal   Collection Time: 03/27/21  5:15 PM   Specimen: BLOOD  Result Value Ref Range Status   Specimen Description   Final     BLOOD LEFT ANTECUBITAL Performed at [facility] [address], [place], [place] [address]    Special Requests   Final    BOTTLES DRAWN AEROBIC AND ANAEROBIC Blood Culture adequate volume Performed at [facility] [address], [place], [place] [address]    Culture  Setup Time   Final    GRAM NEGATIVE RODS IN BOTH AEROBIC AND ANAEROBIC BOTTLES CRITICAL VALUE NOTED.  VALUE IS CONSISTENT WITH PREVIOUSLY REPORTED AND CALLED VALUE.    Culture (A)  Final    ESCHERICHIA COLI SUSCEPTIBILITIES PERFORMED ON PREVIOUS CULTURE WITHIN THE LAST 5 DAYS. Performed at [facility], [place] [address] [place], [place] [address]    Report Status 03/30/2021 FINAL  Final  Resp Panel by RT-PCR (Flu A&B, Covid) Nasopharyngeal Swab     Status: None   Collection Time: 03/27/21  5:58 PM   Specimen: Nasopharyngeal Swab; Nasopharyngeal(NP) swabs in vial transport medium  Result Value Ref Range Status   SARS Coronavirus 2 by RT PCR NEGATIVE NEGATIVE Final    Comment: (NOTE) SARS-CoV-2 target nucleic acids are NOT DETECTED.  The SARS-CoV-2 RNA is generally detectable in upper respiratory specimens during the acute phase of infection. The lowest concentration of SARS-CoV-2 viral copies this assay can detect is 138 copies/mL. A negative result does not preclude SARS-Cov-2 infection and should not be used as the sole basis for treatment or other patient management decisions. A negative result may occur with  improper specimen collection/handling, submission of specimen other than nasopharyngeal swab, presence of viral mutation(s) within the areas targeted by this assay, and inadequate number of viral copies(<138 copies/mL). A negative result must be combined with clinical observations, patient history, and epidemiological information. The expected result is Negative.  Fact Sheet for Patients:  EntrepreneurPulse.com.au  Fact Sheet for Healthcare  Providers:  IncredibleEmployment.be  This test is no t yet approved or cleared by the [place] FDA and  has been authorized for detection and/or diagnosis of SARS-CoV-2 by FDA under an Emergency Use Authorization (EUA). This EUA will remain  in effect (meaning this test can be used) for the duration of the COVID-19 declaration under Section 564(b)(1) of the Act, 21 U.S.C.section 360bbb-3(b)(1), unless the authorization is terminated  or revoked sooner.       Influenza A by PCR NEGATIVE NEGATIVE Final   Influenza B by PCR NEGATIVE NEGATIVE Final    Comment: (NOTE) The Xpert Xpress SARS-CoV-2/FLU/RSV plus assay is intended as an aid in the diagnosis of influenza from Nasopharyngeal swab specimens and should not be used as a sole basis for treatment. Nasal washings and aspirates are unacceptable for Xpert Xpress SARS-CoV-2/FLU/RSV testing.  Fact Sheet for Patients: EntrepreneurPulse.com.au  Fact Sheet for Healthcare Providers: IncredibleEmployment.be  This test  is not yet approved or cleared by the [place] and has been authorized for detection and/or diagnosis of SARS-CoV-2 by FDA under an Emergency Use Authorization (EUA). This EUA will remain in effect (meaning this test can be used) for the duration of the COVID-19 declaration under Section 564(b)(1) of the Act, 21 U.S.C. section 360bbb-3(b)(1), unless the authorization is terminated or revoked.  Performed at [facility], [place] [address], [place], [place] [address]      Labs: BNP (last 3 results) No results for input(s): BNP in the last 8760 hours. Basic Metabolic Panel: Recent Labs  Lab 03/27/21 1658 03/27/21 2311 03/28/21 0405 03/28/21 1243 03/29/21 0358 03/30/21 0420 03/31/21 0430  NA 133*  --  141 137 139 139 138  K 3.9  --  3.0* 4.0 4.0 3.7 4.4  CL 101  --  118* 107 108 108 107  CO2 20*  --  18* 22 24 23 25   GLUCOSE  155*  --  87 117* 107* 109* 123*  BUN 20  --  15 20 22 21 17   CREATININE 1.40*  --  1.03 1.41* 1.52* <0.30* 1.27*  CALCIUM 9.2  --  5.8* 8.5* 8.6* 8.6* 8.6*  MG 1.7  --  1.4*  --  2.3  --   --   PHOS  --  4.2 2.7  --   --   --   --    Liver Function Tests: Recent Labs  Lab 03/28/21 0405 03/28/21 1243 03/29/21 0358 03/30/21 0420 03/31/21 0430  AST 24 35 27 14* 19  ALT 45* 67* 56* 38 38  ALKPHOS 58 91 92 79 74  BILITOT 3.9* 5.1* 2.7* 1.6* 0.8  PROT 3.9* 6.0* 6.0* 5.9* 5.9*  ALBUMIN 2.0* 3.2* 3.2* 2.9* 2.8*   Recent Labs  Lab 03/27/21 1658  LIPASE 24   No results for input(s): AMMONIA in the last 168 hours. CBC: Recent Labs  Lab 03/27/21 1658 03/28/21 0405 03/28/21 1350 03/29/21 0358 03/30/21 0420 03/31/21 0430  WBC 13.9* 10.1 7.8 5.5 5.6 7.0  NEUTROABS 13.4* 9.1*  --   --   --   --   HGB 17.2* 14.8 14.4 14.4 14.1 14.1  HCT 49.8 44.2 43.6 42.9 41.3 42.1  MCV 90.1 92.3 93.8 91.5 91.6 90.9  PLT 117* 108* 112* 134* 146* 168   Cardiac Enzymes: Recent Labs  Lab 03/27/21 2311  CKTOTAL 93   BNP: Invalid input(s): POCBNP CBG: No results for input(s): GLUCAP in the last 168 hours. D-Dimer No results for input(s): DDIMER in the last 72 hours. Hgb A1c No results for input(s): HGBA1C in the last 72 hours. Lipid Profile No results for input(s): CHOL, HDL, LDLCALC, TRIG, CHOLHDL, LDLDIRECT in the last 72 hours. Thyroid function studies No results for input(s): TSH, T4TOTAL, T3FREE, THYROIDAB in the last 72 hours.  Invalid input(s): FREET3 Anemia work up No results for input(s): VITAMINB12, FOLATE, FERRITIN, TIBC, IRON, RETICCTPCT in the last 72 hours. Urinalysis    Component Value Date/Time   COLORURINE AMBER (A) 03/27/2021 2037   APPEARANCEUR HAZY (A) 03/27/2021 2037   LABSPEC 1.029 03/27/2021 2037   PHURINE 5.0 03/27/2021 2037   GLUCOSEU NEGATIVE 03/27/2021 2037   HGBUR NEGATIVE 03/27/2021 2037   BILIRUBINUR SMALL (A) 03/27/2021 2037   KETONESUR 20 (A)  03/27/2021 2037   PROTEINUR 100 (A) 03/27/2021 2037   NITRITE NEGATIVE 03/27/2021 2037   LEUKOCYTESUR NEGATIVE 03/27/2021 2037   Sepsis Labs Invalid input(s): PROCALCITONIN,  WBC,  LACTICIDVEN  Microbiology Recent Results (from the past 240 hour(s))  Culture, blood (routine x 2)     Status: Abnormal   Collection Time: 03/27/21  4:56 PM   Specimen: Right Antecubital; Blood  Result Value Ref Range Status   Specimen Description   Final    RIGHT ANTECUBITAL Performed at [facility], [place] [address], [place], [place] [address]    Special Requests   Final    BOTTLES DRAWN AEROBIC AND ANAEROBIC Blood Culture adequate volume Performed at [place] [address], [place], [place] [address]    Culture  Setup Time   Final    GRAM NEGATIVE RODS IN BOTH AEROBIC AND ANAEROBIC BOTTLES Organism ID to follow CRITICAL RESULT CALLED TO, READ BACK BY AND VERIFIED WITH: Sanda Klein, AT [id number] 03/28/21 D. VANHOOK Performed at [facility], [place] [address], [place], [place] [address]    Culture ESCHERICHIA COLI (A)  Final   Report Status 03/30/2021 FINAL  Final   Organism ID, Bacteria ESCHERICHIA COLI  Final      Susceptibility   Escherichia coli - MIC*    AMPICILLIN 8 SENSITIVE Sensitive     CEFAZOLIN <=4 SENSITIVE Sensitive     CEFEPIME <=0.12 SENSITIVE Sensitive     CEFTAZIDIME <=1 SENSITIVE Sensitive     CEFTRIAXONE <=0.25 SENSITIVE Sensitive     CIPROFLOXACIN <=0.25 SENSITIVE Sensitive     GENTAMICIN <=1 SENSITIVE Sensitive     IMIPENEM <=0.25 SENSITIVE Sensitive     TRIMETH/SULFA <=20 SENSITIVE Sensitive     AMPICILLIN/SULBACTAM 4 SENSITIVE Sensitive     PIP/TAZO <=4 SENSITIVE Sensitive     * ESCHERICHIA COLI  Blood Culture ID Panel (Reflexed)     Status: Abnormal   Collection Time: 03/27/21  4:56 PM  Result Value Ref Range Status   Enterococcus faecalis NOT DETECTED NOT DETECTED Final   Enterococcus Faecium NOT DETECTED NOT DETECTED  Final   Listeria monocytogenes NOT DETECTED NOT DETECTED Final   Staphylococcus species NOT DETECTED NOT DETECTED Final   Staphylococcus aureus (BCID) NOT DETECTED NOT DETECTED Final   Staphylococcus epidermidis NOT DETECTED NOT DETECTED Final   Staphylococcus lugdunensis NOT DETECTED NOT DETECTED Final   Streptococcus species NOT DETECTED NOT DETECTED Final   Streptococcus agalactiae NOT DETECTED NOT DETECTED Final   Streptococcus pneumoniae NOT DETECTED NOT DETECTED Final   Streptococcus pyogenes NOT DETECTED NOT DETECTED Final   A.calcoaceticus-baumannii NOT DETECTED NOT DETECTED Final   Bacteroides fragilis NOT DETECTED NOT DETECTED Final   Enterobacterales DETECTED (A) NOT DETECTED Final    Comment: Enterobacterales represent a large order of gram negative bacteria, not a single organism. CRITICAL RESULT CALLED TO, READ BACK BY AND VERIFIED WITH: Sanda Klein, AT [phone] 03/28/21 D. VANHOOK    Enterobacter cloacae complex NOT DETECTED NOT DETECTED Final   Escherichia coli DETECTED (A) NOT DETECTED Final    Comment: CRITICAL RESULT CALLED TO, READ BACK BY AND VERIFIED WITH: Sanda Klein, AT [phone] 03/28/21 D. VANHOOK    Klebsiella aerogenes NOT DETECTED NOT DETECTED Final   Klebsiella oxytoca NOT DETECTED NOT DETECTED Final   Klebsiella pneumoniae NOT DETECTED NOT DETECTED Final   Proteus species NOT DETECTED NOT DETECTED Final   Salmonella species NOT DETECTED NOT DETECTED Final   Serratia marcescens NOT DETECTED NOT DETECTED Final   Haemophilus influenzae NOT DETECTED NOT DETECTED Final   Neisseria meningitidis NOT DETECTED NOT DETECTED Final   Pseudomonas aeruginosa NOT DETECTED NOT DETECTED Final  Stenotrophomonas maltophilia NOT DETECTED NOT DETECTED Final   Candida albicans NOT DETECTED NOT DETECTED Final   Candida auris NOT DETECTED NOT DETECTED Final   Candida glabrata NOT DETECTED NOT DETECTED Final   Candida krusei NOT DETECTED NOT DETECTED Final   Candida parapsilosis  NOT DETECTED NOT DETECTED Final   Candida tropicalis NOT DETECTED NOT DETECTED Final   Cryptococcus neoformans/gattii NOT DETECTED NOT DETECTED Final   CTX-M ESBL NOT DETECTED NOT DETECTED Final   Carbapenem resistance IMP NOT DETECTED NOT DETECTED Final   Carbapenem resistance KPC NOT DETECTED NOT DETECTED Final   Carbapenem resistance NDM NOT DETECTED NOT DETECTED Final   Carbapenem resist OXA 48 LIKE NOT DETECTED NOT DETECTED Final   Carbapenem resistance VIM NOT DETECTED NOT DETECTED Final    Comment: Performed at [facility], [address] [place], [place] [address]  Culture, blood (routine x 2)     Status: Abnormal   Collection Time: 03/27/21  5:15 PM   Specimen: BLOOD  Result Value Ref Range Status   Specimen Description   Final    BLOOD LEFT ANTECUBITAL Performed at [place] [address] [place], [place] [address]    Special Requests   Final    BOTTLES DRAWN AEROBIC AND ANAEROBIC Blood Culture adequate volume Performed at [place] [address] [place], [place] [address]    Culture  Setup Time   Final    GRAM NEGATIVE RODS IN BOTH AEROBIC AND ANAEROBIC BOTTLES CRITICAL VALUE NOTED.  VALUE IS CONSISTENT WITH PREVIOUSLY REPORTED AND CALLED VALUE.    Culture (A)  Final    ESCHERICHIA COLI SUSCEPTIBILITIES PERFORMED ON PREVIOUS CULTURE WITHIN THE LAST 5 DAYS. Performed at [facility], [place] [address], [place], [place] [address]    Report Status 03/30/2021 FINAL  Final  Resp Panel by RT-PCR (Flu A&B, Covid) Nasopharyngeal Swab     Status: None   Collection Time: 03/27/21  5:58 PM   Specimen: Nasopharyngeal Swab; Nasopharyngeal(NP) swabs in vial transport medium  Result Value Ref Range Status   SARS Coronavirus 2 by RT PCR NEGATIVE NEGATIVE Final    Comment: (NOTE) SARS-CoV-2 target nucleic acids are NOT DETECTED.  The SARS-CoV-2 RNA is generally detectable in upper respiratory specimens during the acute  phase of infection. The lowest concentration of SARS-CoV-2 viral copies this assay can detect is 138 copies/mL. A negative result does not preclude SARS-Cov-2 infection and should not be used as the sole basis for treatment or other patient management decisions. A negative result may occur with  improper specimen collection/handling, submission of specimen other than nasopharyngeal swab, presence of viral mutation(s) within the areas targeted by this assay, and inadequate number of viral copies(<138 copies/mL). A negative result must be combined with clinical observations, patient history, and epidemiological information. The expected result is Negative.  Fact Sheet for Patients:  EntrepreneurPulse.com.au  Fact Sheet for Healthcare Providers:  IncredibleEmployment.be  This test is no t yet approved or cleared by the [place] FDA and  has been authorized for detection and/or diagnosis of SARS-CoV-2 by FDA under an Emergency Use Authorization (EUA). This EUA will remain  in effect (meaning this test can be used) for the duration of the COVID-19 declaration under Section 564(b)(1) of the Act, 21 U.S.C.section 360bbb-3(b)(1), unless the authorization is terminated  or revoked sooner.       Influenza A by PCR NEGATIVE NEGATIVE Final   Influenza B by PCR NEGATIVE NEGATIVE Final  Comment: (NOTE) The Xpert Xpress SARS-CoV-2/FLU/RSV plus assay is intended as an aid in the diagnosis of influenza from Nasopharyngeal swab specimens and should not be used as a sole basis for treatment. Nasal washings and aspirates are unacceptable for Xpert Xpress SARS-CoV-2/FLU/RSV testing.  Fact Sheet for Patients: EntrepreneurPulse.com.au  Fact Sheet for Healthcare Providers: IncredibleEmployment.be  This test is not yet approved or cleared by the [place] FDA and has been authorized for detection and/or diagnosis of  SARS-CoV-2 by FDA under an Emergency Use Authorization (EUA). This EUA will remain in effect (meaning this test can be used) for the duration of the COVID-19 declaration under Section 564(b)(1) of the Act, 21 U.S.C. section 360bbb-3(b)(1), unless the authorization is terminated or revoked.  Performed at [facility], [place] [address] [place], [place] [address]      SIGNED:   [name], MD  [facility] 03/31/2021, 9:42 AM Pager   If 7PM-7AM, please contact night-coverage [email or web address] Password [email or web address]

## 2021-03-31 NOTE — Progress Notes (Signed)
1 Day Post-Op   Subjective/Chief Complaint: Doing well post op Minimal pain Tolerating po   Objective: Vital signs in last 24 hours: Temp:  [97.9 F (36.6 C)-98.6 F (37 C)] 98 F (36.7 C) (01/28 0526) Pulse Rate:  [51-70] 51 (01/28 0526) Resp:  [14-20] 18 (01/28 0526) BP: (104-147)/(57-120) 123/83 (01/28 0526) SpO2:  [90 %-100 %] 97 % (01/28 0526) Weight:  [85.3 kg] 85.3 kg (01/27 1132) Last BM Date: 03/30/21  Intake/Output from previous day: 01/27 0701 - 01/28 0700 In: 2374.8 [P.O.:120; I.V.:1954.8; IV Piggyback:300] Out: 310 [Urine:300; Blood:10] Intake/Output this shift: No intake/output data recorded.  Exam: Awake and alert Abdomen soft, minimally tender  Lab Results:  Recent Labs    03/30/21 0420 03/31/21 0430  WBC 5.6 7.0  HGB 14.1 14.1  HCT 41.3 42.1  PLT 146* 168   BMET Recent Labs    03/30/21 0420 03/31/21 0430  NA 139 138  K 3.7 4.4  CL 108 107  CO2 23 25  GLUCOSE 109* 123*  BUN 21 17  CREATININE <0.30* 1.27*  CALCIUM 8.6* 8.6*   PT/INR No results for input(s): LABPROT, INR in the last 72 hours. ABG No results for input(s): PHART, HCO3 in the last 72 hours.  Invalid input(s): PCO2, PO2  Studies/Results: DG Cholangiogram Operative  Result Date: 03/30/2021 CLINICAL DATA:  Intraoperative cholangiogram EXAM: INTRAOPERATIVE CHOLANGIOGRAM TECHNIQUE: Cholangiographic images from the C-arm fluoroscopic device were submitted for interpretation post-operatively. Please see the procedural report for the amount of contrast and the fluoroscopy time utilized. COMPARISON:  None. FINDINGS: A single fluoroscopic cine loop and spot images were submitted for review taken during intraoperative cholangiogram. Images demonstrate surgical instrumentation and clips overlying the gallbladder fossa. Contrast injection through the cystic duct opacifies both the intrahepatic and extrahepatic biliary tree. The ducts are normal in caliber without obvious filling defect.  There is prompt passage of contrast material through the ampulla into the small bowel. IMPRESSION: Intraoperative cholangiogram as described. Refer to operative report for full details. Electronically Signed   By: Albin Felling M.D.   On: 03/30/2021 13:52   MR 3D Recon At Scanner  Addendum Date: 03/30/2021   ADDENDUM REPORT: 03/30/2021 09:53 ADDENDUM: Dictation error occurred in the findings and impression section of the report incorrectly states that there are 2 large choledocholithiasis the report should state: There are 2 large CHOLELITHIASIS without findings of acute cholecystitis, biliary ductal dilation or choledocholithiasis. Electronically Signed   By: Dahlia Bailiff M.D.   On: 03/30/2021 09:53   Result Date: 03/30/2021 CLINICAL DATA:  Cholelithiasis. Right upper quadrant abdominal pain. EXAM: MRI ABDOMEN WITHOUT AND WITH CONTRAST (INCLUDING MRCP) TECHNIQUE: Multiplanar multisequence MR imaging of the abdomen was performed both before and after the administration of intravenous contrast. Heavily T2-weighted images of the biliary and pancreatic ducts were obtained, and three-dimensional MRCP images were rendered by post processing. CONTRAST:  32mL GADAVIST GADOBUTROL 1 MMOL/ML IV SOLN COMPARISON:  Ultrasound March 27, 2021, CT June 26, 2015, and nuclear medicine HIDA scan March 28, 2021 FINDINGS: Lower chest: Heterogeneous signal in the lung bases, favor atelectasis. Hepatobiliary: No hepatic steatosis. Periportal edema. Scattered tiny hepatic cysts. T2 hyperintense 18 mm segment II hepatic lesion on image 18/5 which demonstrates nodular arterial enhancement with filling in of the lesion with contrast on delayed postcontrast imaging similar in intensity to background aorta, consistent with a benign hepatic hemangioma. Two large choledocholithiasis in a nondilated gallbladder without gallbladder wall thickening or pericholecystic fluid. No biliary ductal dilation and no choledocholithiasis.  Pancreas: Intrinsic T1 signal of the pancreatic parenchyma is within normal limits. There is homogeneous enhancement of the pancreas postcontrast administration. No pancreatic ductal dilation. No cystic or solid hyperenhancing pancreatic lesions identified. Spleen:  Within normal limits in size and appearance. Adrenals/Urinary Tract: Bilateral adrenal glands appear normal. No hydronephrosis. No solid enhancing renal mass. Stomach/Bowel: Moderate-sized hiatal hernia. No evidence of acute bowel inflammation or pathologic dilation of loops of small or large bowel in the abdomen. Colonic diverticulosis without findings of acute diverticulitis. Vascular/Lymphatic: No abdominal aortic aneurysm. The portal, splenic and superior mesenteric veins are patent. No pathologically enlarged abdominal lymph nodes. Other:  No abdominal ascites. Musculoskeletal: No suspicious bone lesions identified. IMPRESSION: 1. Two large choledocholithiasis, without findings of acute cholecystitis, biliary ductal dilation or choledocholithiasis. 2. Periportal edema, nonspecific but in correlation with findings recent HIDA scan most likely reflect hepatitis. 3. Moderate-sized hiatal hernia. 4. Heterogeneous signal in the lung bases, favor atelectasis. Electronically Signed: By: Dahlia Bailiff M.D. On: 03/30/2021 07:53   MR ABDOMEN MRCP W WO CONTAST  Addendum Date: 03/30/2021   ADDENDUM REPORT: 03/30/2021 09:53 ADDENDUM: Dictation error occurred in the findings and impression section of the report incorrectly states that there are 2 large choledocholithiasis the report should state: There are 2 large CHOLELITHIASIS without findings of acute cholecystitis, biliary ductal dilation or choledocholithiasis. Electronically Signed   By: Dahlia Bailiff M.D.   On: 03/30/2021 09:53   Result Date: 03/30/2021 CLINICAL DATA:  Cholelithiasis. Right upper quadrant abdominal pain. EXAM: MRI ABDOMEN WITHOUT AND WITH CONTRAST (INCLUDING MRCP) TECHNIQUE:  Multiplanar multisequence MR imaging of the abdomen was performed both before and after the administration of intravenous contrast. Heavily T2-weighted images of the biliary and pancreatic ducts were obtained, and three-dimensional MRCP images were rendered by post processing. CONTRAST:  74mL GADAVIST GADOBUTROL 1 MMOL/ML IV SOLN COMPARISON:  Ultrasound March 27, 2021, CT June 26, 2015, and nuclear medicine HIDA scan March 28, 2021 FINDINGS: Lower chest: Heterogeneous signal in the lung bases, favor atelectasis. Hepatobiliary: No hepatic steatosis. Periportal edema. Scattered tiny hepatic cysts. T2 hyperintense 18 mm segment II hepatic lesion on image 18/5 which demonstrates nodular arterial enhancement with filling in of the lesion with contrast on delayed postcontrast imaging similar in intensity to background aorta, consistent with a benign hepatic hemangioma. Two large choledocholithiasis in a nondilated gallbladder without gallbladder wall thickening or pericholecystic fluid. No biliary ductal dilation and no choledocholithiasis. Pancreas: Intrinsic T1 signal of the pancreatic parenchyma is within normal limits. There is homogeneous enhancement of the pancreas postcontrast administration. No pancreatic ductal dilation. No cystic or solid hyperenhancing pancreatic lesions identified. Spleen:  Within normal limits in size and appearance. Adrenals/Urinary Tract: Bilateral adrenal glands appear normal. No hydronephrosis. No solid enhancing renal mass. Stomach/Bowel: Moderate-sized hiatal hernia. No evidence of acute bowel inflammation or pathologic dilation of loops of small or large bowel in the abdomen. Colonic diverticulosis without findings of acute diverticulitis. Vascular/Lymphatic: No abdominal aortic aneurysm. The portal, splenic and superior mesenteric veins are patent. No pathologically enlarged abdominal lymph nodes. Other:  No abdominal ascites. Musculoskeletal: No suspicious bone lesions  identified. IMPRESSION: 1. Two large choledocholithiasis, without findings of acute cholecystitis, biliary ductal dilation or choledocholithiasis. 2. Periportal edema, nonspecific but in correlation with findings recent HIDA scan most likely reflect hepatitis. 3. Moderate-sized hiatal hernia. 4. Heterogeneous signal in the lung bases, favor atelectasis. Electronically Signed: By: Dahlia Bailiff M.D. On: 03/30/2021 07:53    Anti-infectives: Anti-infectives (From admission, onward)    Start  Dose/Rate Route Frequency Ordered Stop   03/30/21 1212  sodium chloride 0.9 % with cefTRIAXone (ROCEPHIN) ADS Med       Note to Pharmacy: Marla Roe M: cabinet override      03/30/21 1212 03/31/21 0029   03/30/21 1200  ampicillin-sulbactam (UNASYN) 1.5 g in sodium chloride 0.9 % 100 mL IVPB        1.5 g 200 mL/hr over 30 Minutes Intravenous Every 6 hours 03/30/21 1021     03/29/21 1200  cefTRIAXone (ROCEPHIN) 2 g in sodium chloride 0.9 % 100 mL IVPB  Status:  Discontinued        2 g 200 mL/hr over 30 Minutes Intravenous Every 24 hours 03/29/21 1036 03/30/21 1021   03/29/21 0200  piperacillin-tazobactam (ZOSYN) IVPB 3.375 g  Status:  Discontinued        3.375 g 12.5 mL/hr over 240 Minutes Intravenous Every 8 hours 03/28/21 1944 03/29/21 1036   03/28/21 0200  piperacillin-tazobactam (ZOSYN) IVPB 3.375 g  Status:  Discontinued        3.375 g 12.5 mL/hr over 240 Minutes Intravenous Every 8 hours 03/27/21 2050 03/28/21 1944   03/27/21 1815  piperacillin-tazobactam (ZOSYN) IVPB 3.375 g        3.375 g 100 mL/hr over 30 Minutes Intravenous  Once 03/27/21 1806 03/27/21 1843       Assessment/Plan: s/p Procedure(s): LAPAROSCOPIC CHOLECYSTECTOMY WITH INTRAOPERATIVE CHOLANGIOGRAM (N/A)  Doing well post op Given bacteremia, would probably give antibiotics for 5 more days (Augmentin) F/u CCS at outpt  LOS: 3 days    Juan Snow 03/31/2021

## 2021-04-02 LAB — LEGIONELLA PNEUMOPHILA SEROGP 1 UR AG: L. pneumophila Serogp 1 Ur Ag: NEGATIVE

## 2021-04-02 LAB — SURGICAL PATHOLOGY

## 2021-05-11 ENCOUNTER — Emergency Department (HOSPITAL_COMMUNITY): Payer: Medicare Other

## 2021-05-11 ENCOUNTER — Encounter (HOSPITAL_COMMUNITY): Payer: Self-pay

## 2021-05-11 ENCOUNTER — Other Ambulatory Visit: Payer: Self-pay

## 2021-05-11 ENCOUNTER — Inpatient Hospital Stay (HOSPITAL_COMMUNITY)
Admission: EM | Admit: 2021-05-11 | Discharge: 2021-05-13 | DRG: 164 | Disposition: A | Discharge status: Home / Self Care | Payer: Medicare Other | Attending: Pulmonary Disease | Admitting: Pulmonary Disease

## 2021-05-11 ENCOUNTER — Inpatient Hospital Stay (HOSPITAL_COMMUNITY): Payer: Medicare Other

## 2021-05-11 DIAGNOSIS — I82412 Acute embolism and thrombosis of left femoral vein: Secondary | ICD-10-CM | POA: Diagnosis present

## 2021-05-11 DIAGNOSIS — M7989 Other specified soft tissue disorders: Secondary | ICD-10-CM

## 2021-05-11 DIAGNOSIS — R0602 Shortness of breath: Secondary | ICD-10-CM

## 2021-05-11 DIAGNOSIS — Z20822 Contact with and (suspected) exposure to covid-19: Secondary | ICD-10-CM | POA: Diagnosis present

## 2021-05-11 DIAGNOSIS — I2602 Saddle embolus of pulmonary artery with acute cor pulmonale: Secondary | ICD-10-CM | POA: Diagnosis not present

## 2021-05-11 DIAGNOSIS — I82422 Acute embolism and thrombosis of left iliac vein: Secondary | ICD-10-CM | POA: Diagnosis present

## 2021-05-11 DIAGNOSIS — R143 Flatulence: Secondary | ICD-10-CM

## 2021-05-11 DIAGNOSIS — R0902 Hypoxemia: Secondary | ICD-10-CM | POA: Diagnosis present

## 2021-05-11 DIAGNOSIS — I2609 Other pulmonary embolism with acute cor pulmonale: Secondary | ICD-10-CM | POA: Diagnosis present

## 2021-05-11 DIAGNOSIS — Z79899 Other long term (current) drug therapy: Secondary | ICD-10-CM

## 2021-05-11 DIAGNOSIS — K219 Gastro-esophageal reflux disease without esophagitis: Secondary | ICD-10-CM | POA: Diagnosis present

## 2021-05-11 DIAGNOSIS — I2699 Other pulmonary embolism without acute cor pulmonale: Principal | ICD-10-CM | POA: Diagnosis present

## 2021-05-11 DIAGNOSIS — Z96642 Presence of left artificial hip joint: Secondary | ICD-10-CM | POA: Diagnosis present

## 2021-05-11 DIAGNOSIS — Z8249 Family history of ischemic heart disease and other diseases of the circulatory system: Secondary | ICD-10-CM

## 2021-05-11 DIAGNOSIS — R079 Chest pain, unspecified: Secondary | ICD-10-CM

## 2021-05-11 DIAGNOSIS — E6609 Other obesity due to excess calories: Secondary | ICD-10-CM | POA: Diagnosis not present

## 2021-05-11 HISTORY — PX: IR ANGIOGRAM SELECTIVE EACH ADDITIONAL VESSEL: IMG667

## 2021-05-11 HISTORY — PX: IR US GUIDE VASC ACCESS RIGHT: IMG2390

## 2021-05-11 HISTORY — PX: IR ANGIOGRAM PULMONARY BILATERAL SELECTIVE: IMG664

## 2021-05-11 HISTORY — PX: IR THROMBECT VENO MECH MOD SED: IMG2300

## 2021-05-11 LAB — CBC WITH DIFFERENTIAL/PLATELET
Abs Immature Granulocytes: 0.66 10*3/uL — ABNORMAL HIGH (ref 0.00–0.07)
Basophils Absolute: 0.1 10*3/uL (ref 0.0–0.1)
Basophils Relative: 0 %
Eosinophils Absolute: 0.1 10*3/uL (ref 0.0–0.5)
Eosinophils Relative: 1 %
HCT: 44.4 % (ref 39.0–52.0)
Hemoglobin: 14.2 g/dL (ref 13.0–17.0)
Immature Granulocytes: 4 %
Lymphocytes Relative: 5 %
Lymphs Abs: 0.9 10*3/uL (ref 0.7–4.0)
MCH: 28.8 pg (ref 26.0–34.0)
MCHC: 32 g/dL (ref 30.0–36.0)
MCV: 90.1 fL (ref 80.0–100.0)
Monocytes Absolute: 0.9 10*3/uL (ref 0.1–1.0)
Monocytes Relative: 5 %
Neutro Abs: 14.9 10*3/uL — ABNORMAL HIGH (ref 1.7–7.7)
Neutrophils Relative %: 85 %
Platelets: 157 10*3/uL (ref 150–400)
RBC: 4.93 MIL/uL (ref 4.22–5.81)
RDW: 13.6 % (ref 11.5–15.5)
WBC: 17.4 10*3/uL — ABNORMAL HIGH (ref 4.0–10.5)
nRBC: 0 % (ref 0.0–0.2)

## 2021-05-11 LAB — POCT ACTIVATED CLOTTING TIME
Activated Clotting Time: 197 seconds
Activated Clotting Time: 263 seconds

## 2021-05-11 LAB — COMPREHENSIVE METABOLIC PANEL
ALT: 74 U/L — ABNORMAL HIGH (ref 0–44)
AST: 34 U/L (ref 15–41)
Albumin: 3.5 g/dL (ref 3.5–5.0)
Alkaline Phosphatase: 88 U/L (ref 38–126)
Anion gap: 11 (ref 5–15)
BUN: 20 mg/dL (ref 8–23)
CO2: 24 mmol/L (ref 22–32)
Calcium: 9.3 mg/dL (ref 8.9–10.3)
Chloride: 101 mmol/L (ref 98–111)
Creatinine, Ser: 1.14 mg/dL (ref 0.61–1.24)
GFR, Estimated: >60 mL/min (ref >60)
Glucose, Bld: 148 mg/dL — ABNORMAL HIGH (ref 70–99)
Potassium: 3.8 mmol/L (ref 3.5–5.1)
Sodium: 136 mmol/L (ref 135–145)
Total Bilirubin: 0.4 mg/dL (ref 0.3–1.2)
Total Protein: 7.4 g/dL (ref 6.5–8.1)

## 2021-05-11 LAB — BRAIN NATRIURETIC PEPTIDE: B Natriuretic Peptide: 23.1 pg/mL (ref 0.0–100.0)

## 2021-05-11 LAB — I-STAT CHEM 8, ED
BUN: 22 mg/dL (ref 8–23)
Calcium, Ion: 1.18 mmol/L (ref 1.15–1.40)
Chloride: 101 mmol/L (ref 98–111)
Creatinine, Ser: 1.2 mg/dL (ref 0.61–1.24)
Glucose, Bld: 149 mg/dL — ABNORMAL HIGH (ref 70–99)
HCT: 45 % (ref 39.0–52.0)
Hemoglobin: 15.3 g/dL (ref 13.0–17.0)
Potassium: 4.2 mmol/L (ref 3.5–5.1)
Sodium: 138 mmol/L (ref 135–145)
TCO2: 27 mmol/L (ref 22–32)

## 2021-05-11 LAB — ECHOCARDIOGRAM COMPLETE
AV Mean grad: 2 mmHg
AV Peak grad: 3.9 mmHg
Ao pk vel: 0.99 m/s
Calc EF: 51.7 %
Height: 67.5 in
S' Lateral: 2.9 cm
Single Plane A2C EF: 56.6 %
Single Plane A4C EF: 41 %
Weight: 2880 oz

## 2021-05-11 LAB — TROPONIN I (HIGH SENSITIVITY)
Troponin I (High Sensitivity): 562 ng/L (ref <18)
Troponin I (High Sensitivity): 838 ng/L (ref <18)

## 2021-05-11 LAB — RESP PANEL BY RT-PCR (FLU A&B, COVID) ARPGX2
Influenza A by PCR: NEGATIVE
Influenza B by PCR: NEGATIVE
SARS Coronavirus 2 by RT PCR: NEGATIVE

## 2021-05-11 LAB — GLUCOSE, CAPILLARY: Glucose-Capillary: 99 mg/dL (ref 70–99)

## 2021-05-11 IMAGING — CT CT ANGIO CHEST
2 of 6 series · 18 of 36 positions shown · IV contrast (agent unspecified)
Comparison: None.

CLINICAL DATA: Shortness of breath and left leg swelling.
Gallbladder surgery 5 weeks ago.

EXAM:
CT ANGIOGRAPHY CHEST WITH CONTRAST
TECHNIQUE: Multidetector CT imaging of the chest was performed using the
standard protocol during bolus administration of intravenous
contrast. Multiplanar CT image reconstructions and MIPs were
obtained to evaluate the vascular anatomy.

[Series 5: thins · axial · 0.62mm/px · z∈[-244,+36]mm · 17 of 316 slices shown]
[im 18/316  lung]
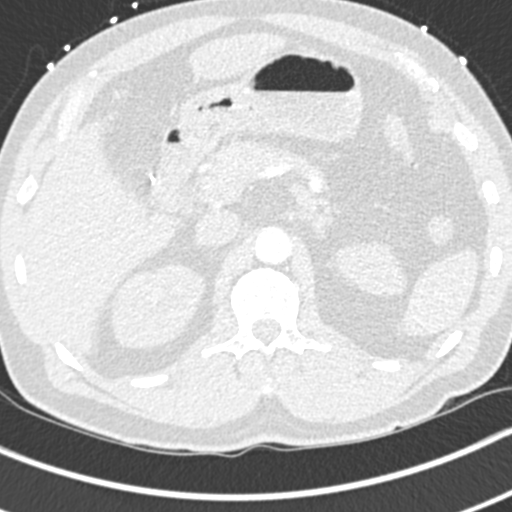
[im 36/316  mediastinal]
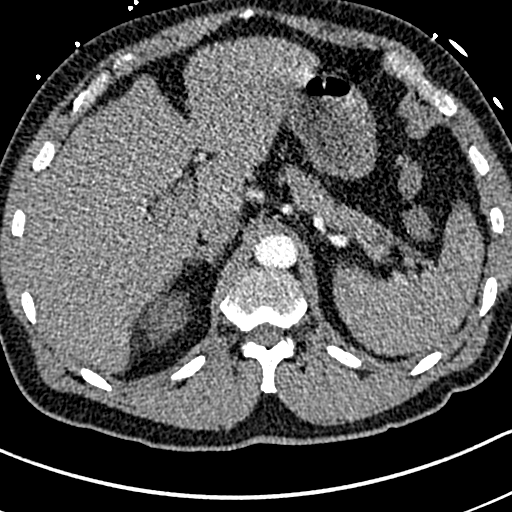
[im 53/316  lung]
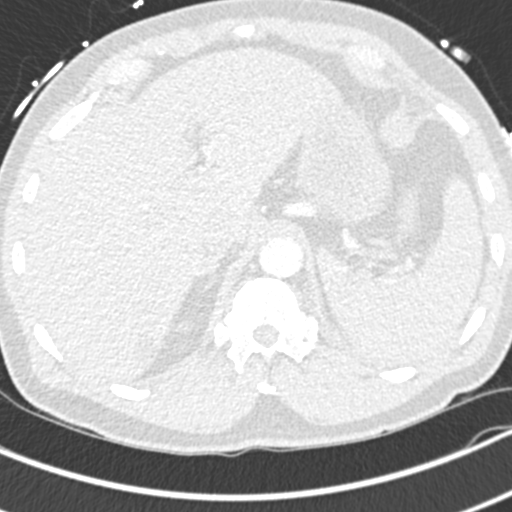
[im 71/316  mediastinal]
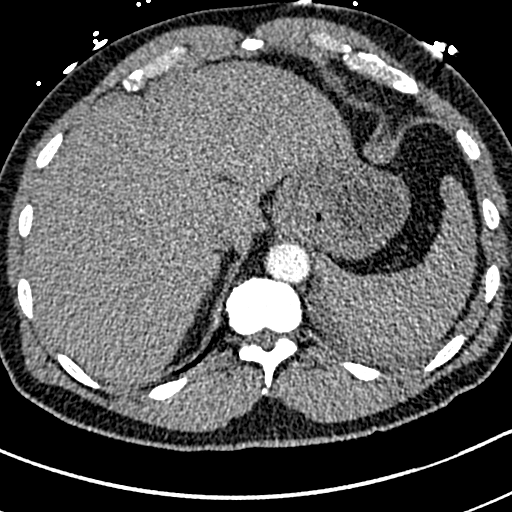
[im 88/316  lung]
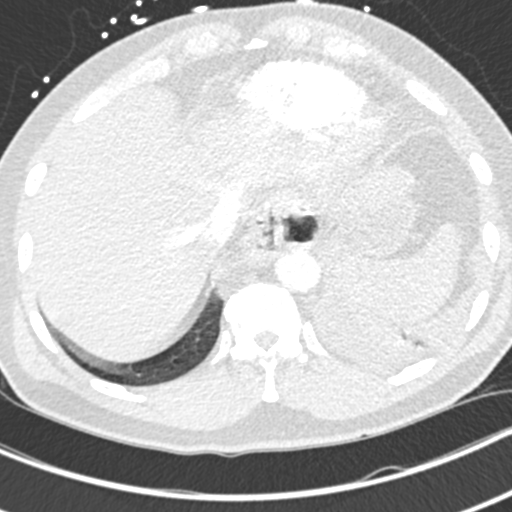
[im 106/316  mediastinal]
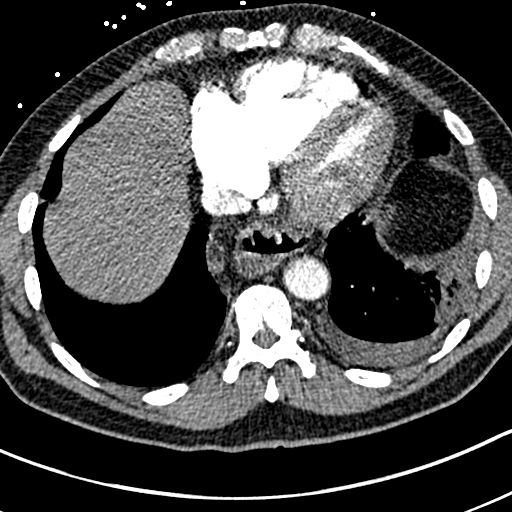
[im 123/316  lung]
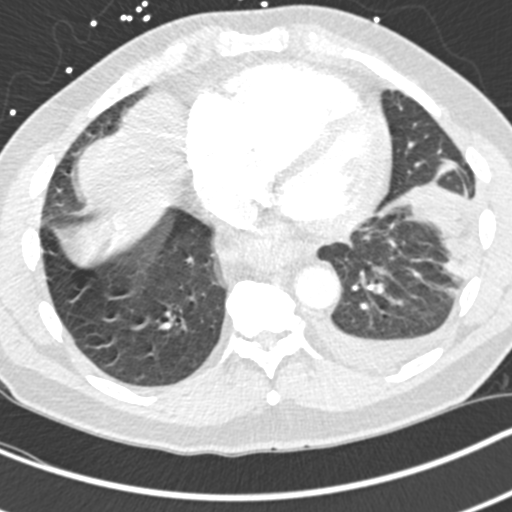
[im 141/316  mediastinal]
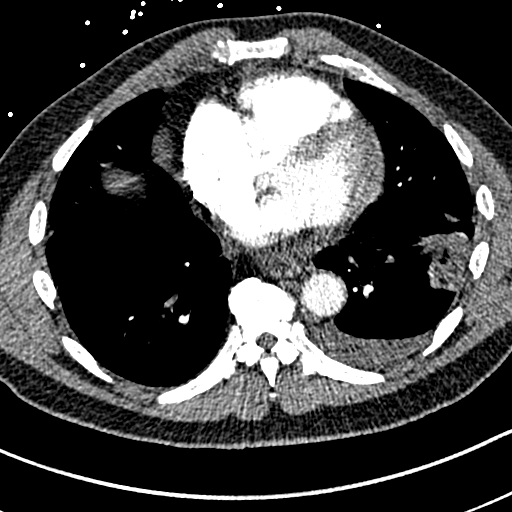
[im 158/316  lung]
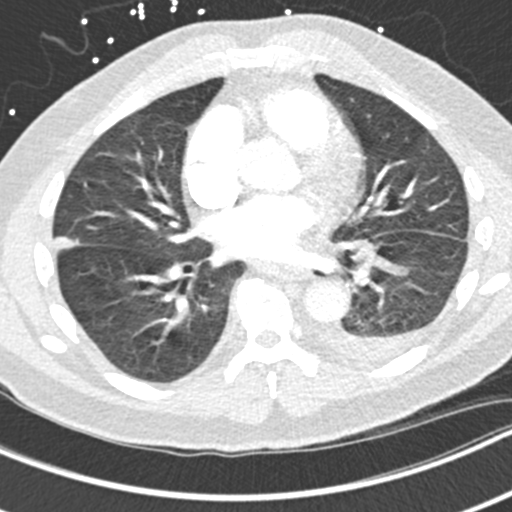
[im 176/316  mediastinal]
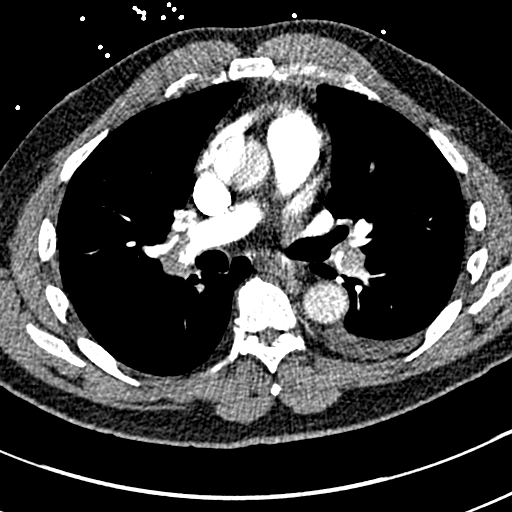
[im 193/316  lung]
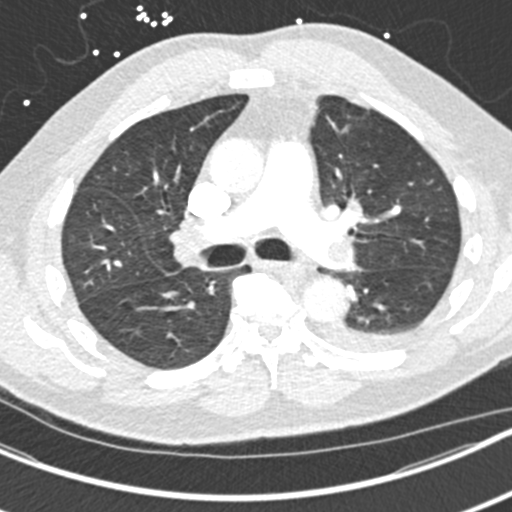
[im 211/316  mediastinal]
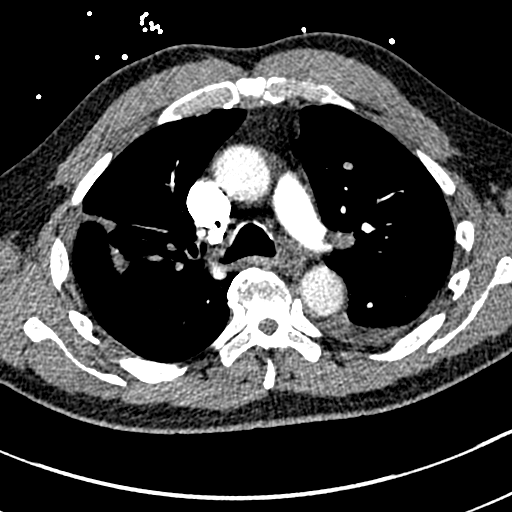
[im 228/316  lung]
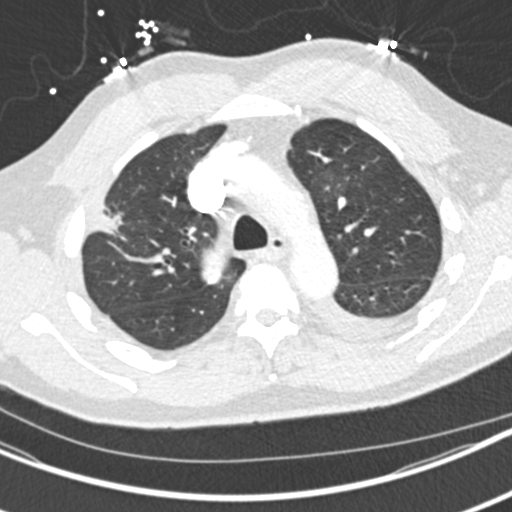
[im 246/316  mediastinal]
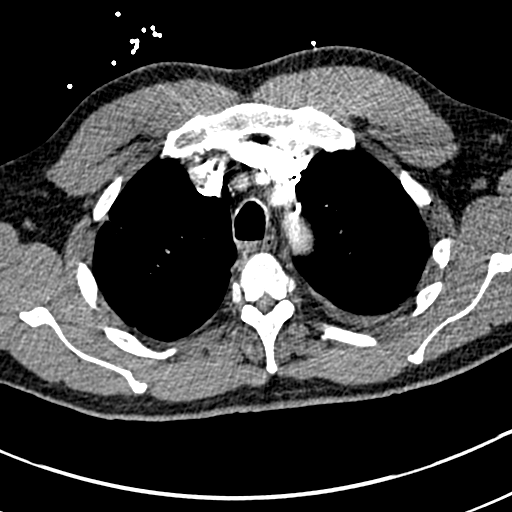
[im 263/316  lung]
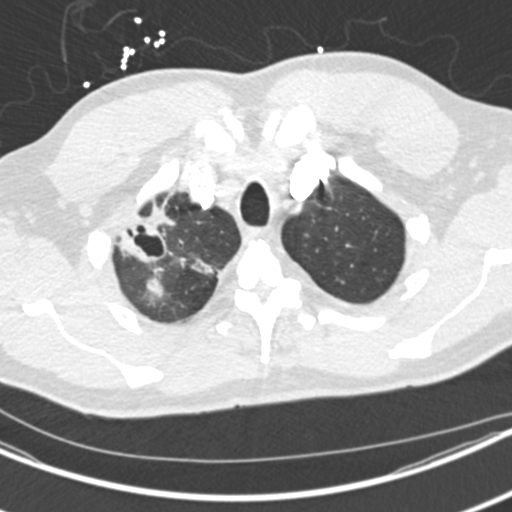
[im 281/316  mediastinal]
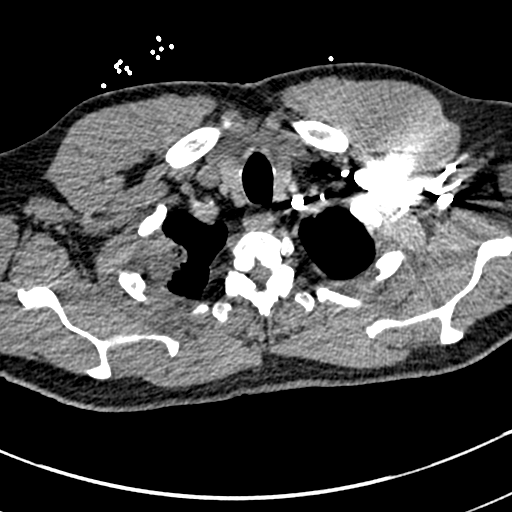
[im 298/316  lung]
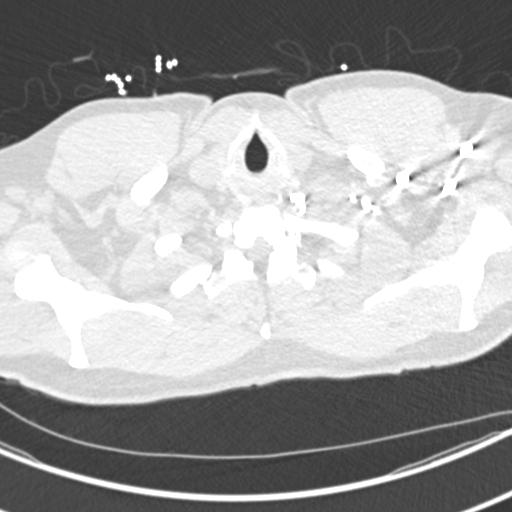

[Series 7: coronal mpr · coronal · 0.66mm/px · 1 of 143 slices shown]
[im 72/143  mediastinal]
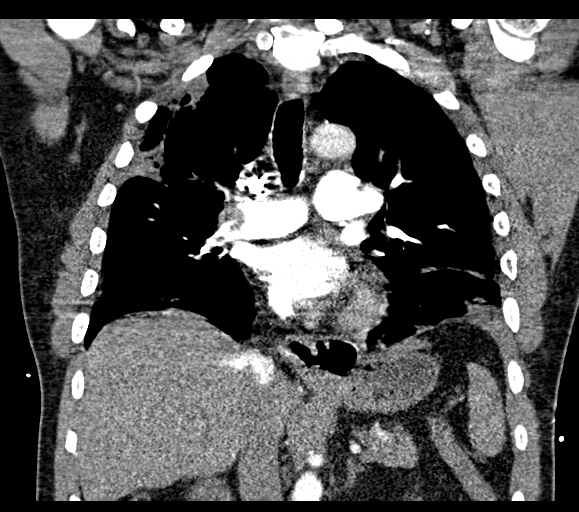

[18 of 36 positions shown; findings below may reference images not displayed]

RADIATION DOSE REDUCTION: This exam was performed according to the
departmental dose-optimization program which includes automated
exposure control, adjustment of the mA and/or kV according to
patient size and/or use of iterative reconstruction technique.

CONTRAST:  80mL OMNIPAQUE IOHEXOL 350 MG/ML SOLN
FINDINGS: Cardiovascular: The heart is normal in size. No pericardial
effusion. The aorta is normal in caliber. No dissection. Minimal
scattered atherosclerotic calcifications.

The pulmonary arterial tree is fairly well opacified. There is
submassive bilateral pulmonary emboli. Associated significant right
heart strain with RV LV ratio of much greater than 1. The left
ventricle is flattened and there is some contrast coursing down the
IVC and into the hepatic veins.

Mediastinum/Nodes: No mediastinal or hilar mass or adenopathy. The
esophagus is grossly normal. There is a moderate to large hiatal
hernia noted.

Lungs/Pleura: Small left pleural effusion and overlying atelectasis.
Peripheral rounded density in the left lower lobe could be
atelectasis or pulmonary infarct.

Similar findings the peripheral aspect of the right upper lobe.

Right apical cavitary process measuring a maximum of 5 cm. This
could represent chronic post pneumonic scarring changes but could
not exclude neoplasm. Recommend pulmonary consultation and possible
PET scan.

Upper Abdomen: No significant upper abdominal findings. The
gallbladder is surgically absent.

Musculoskeletal: No chest wall mass, supraclavicular axillary
adenopathy. The bony thorax is intact.

Review of the MIP images confirms the above findings.
IMPRESSION: 1. Positive for acute PE with CT evidence of right heart strain
(RV/LV Ratio = 1.75) consistent with at least submassive
(intermediate risk) PE. The presence of right heart strain has been
associated with an increased risk of morbidity and mortality. Please
refer to the "PE Focused" order set in [REDACTED].
2. Small left pleural effusion and overlying atelectasis.
3. Peripheral rounded density in the left lower lobe and right upper
lobe could be atelectasis or pulmonary infarct.
4. 5 cm cavitary process in the right lung apex. This could
represent chronic post pneumonic scarring changes but could not
exclude neoplasm. Recommend pulmonary consultation and possible PET
scan.
5. Moderate to large hiatal hernia.
6. Aortic atherosclerosis.

These results were called by telephone at the time of interpretation
on [DATE] at [DATE] to provider RAND , who verbally
acknowledged these results.

Aortic Atherosclerosis ([63]-[63]).

## 2021-05-11 IMAGING — XA IR THROMBOECTOMY MECHANICAL VENOUS
9 of 11 series · 13 of 24 positions shown · IV contrast (IODINE)
Comparison: CTA PE, CTV AP and chest XR, earlier same day.

INDICATION: Briefly, 66-year-old male with recent cholecystectomy in [DATE]
and airline travel a week ago, presenting with large LEFT lower
extremity DVT and large burden PE (RV/LV ratio 1.8).
TECHNIQUE: Informed written consent was obtained from the the patient and/or
patient's representative after a discussion of the risks, benefits
and alternatives to treatment. Questions regarding the procedure
were encouraged and answered. A timeout was performed prior to the
initiation of the procedure.

[Series 1: ir thromboectomy mechanical venous · 1 of 1 slices shown]
[im 1/1]
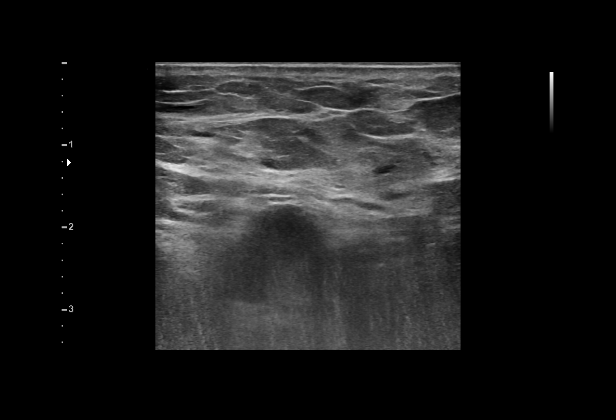

[Series 2: body 4 care · 5 acquisitions, 1 frame shown (1 of 8)]
[im 1/5]
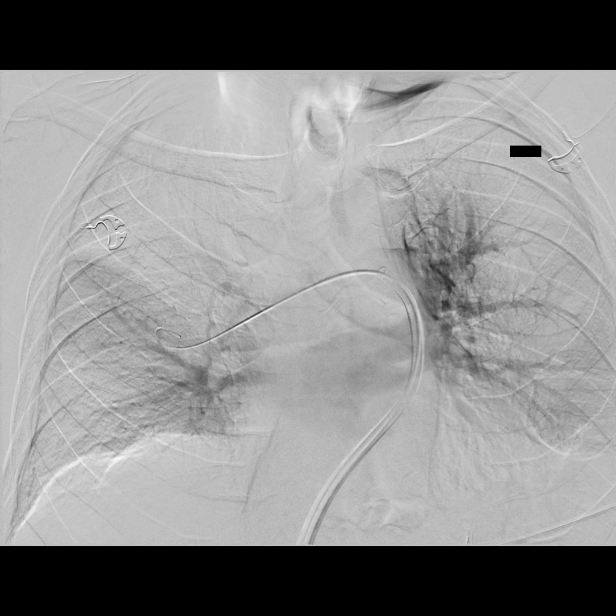

[Series 3: body 4 care · 6 acquisitions, 1 frame shown (2 of 8)]
[im 1/6]
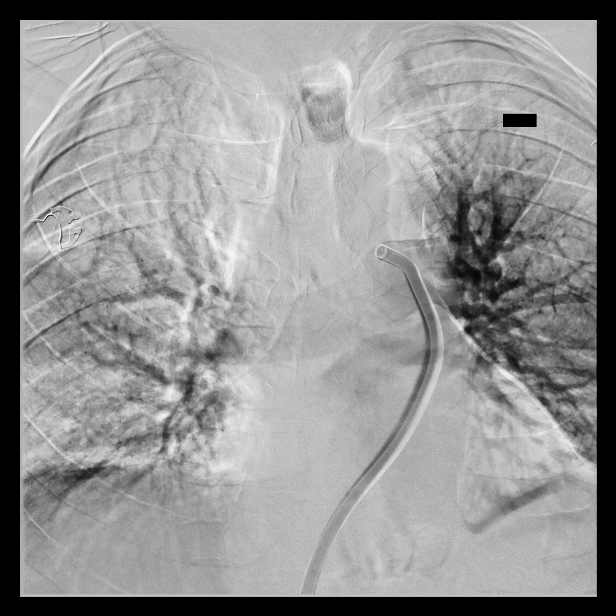

[Series 5: body 4 care · 10 acquisitions, 2 frames shown (3 of 8)]
[im 1/10]
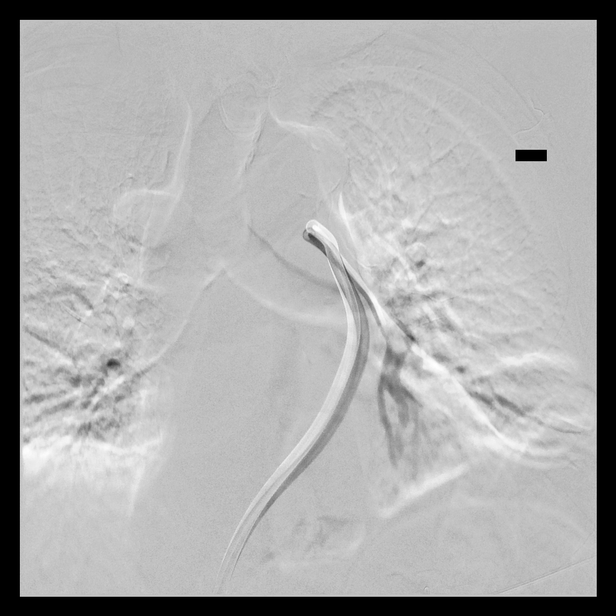
[im 10/10]
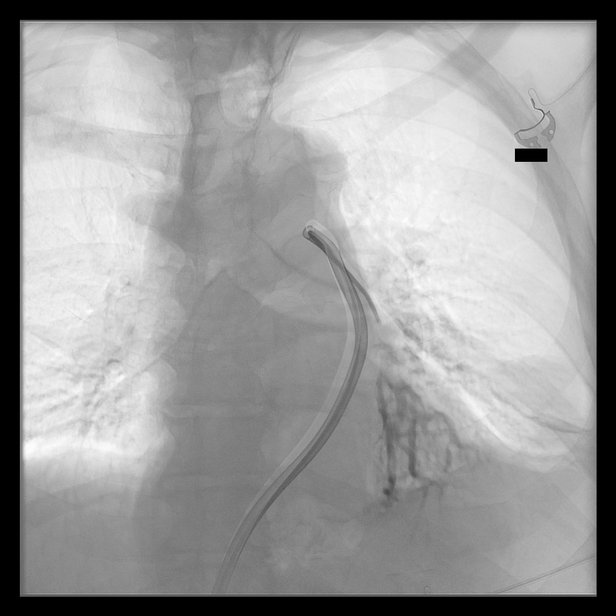

[Series 6: body 4 care · 6 acquisitions, 1 frame shown (4 of 8)]
[im 1/6]
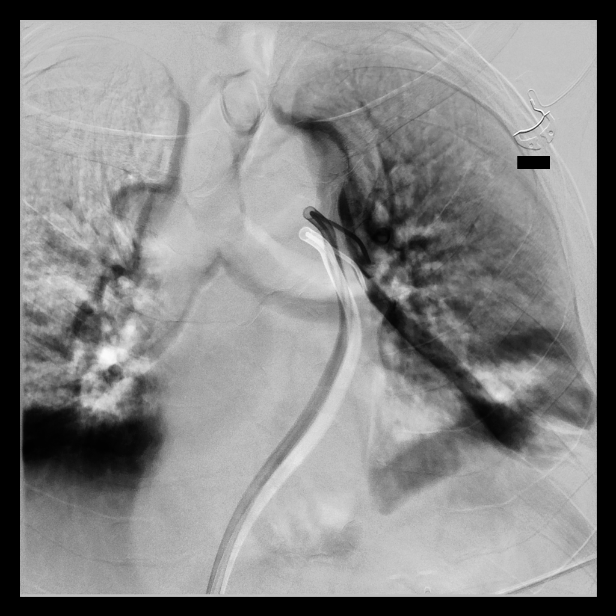

[Series 9: body 4 care · 6 acquisitions, 2 frames shown (5 of 8)]
[im 1/6  full-range]
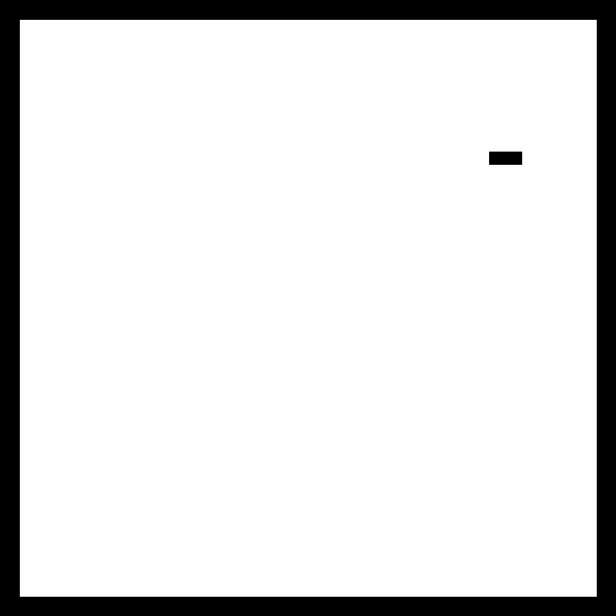
[im 1/6]
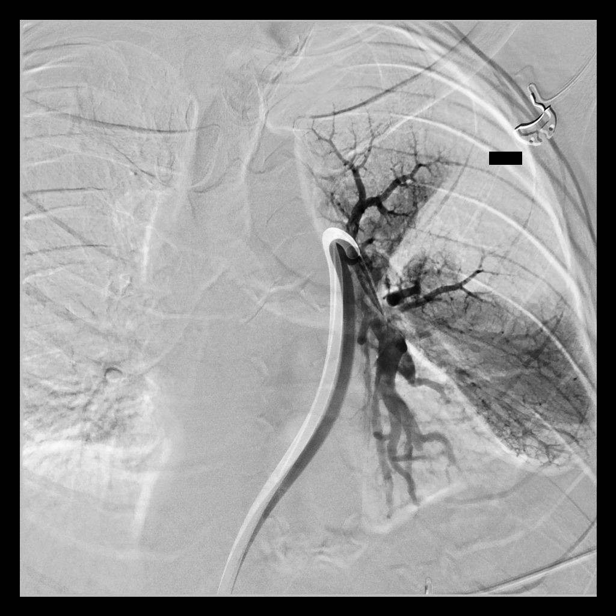

[Series 10: body 4 care · 5 acquisitions, 2 frames shown (6 of 8)]
[im 1/5]
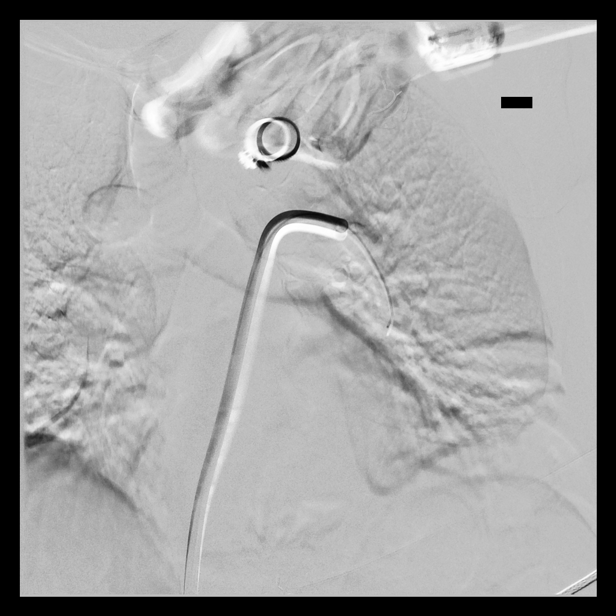
[im 5/5]
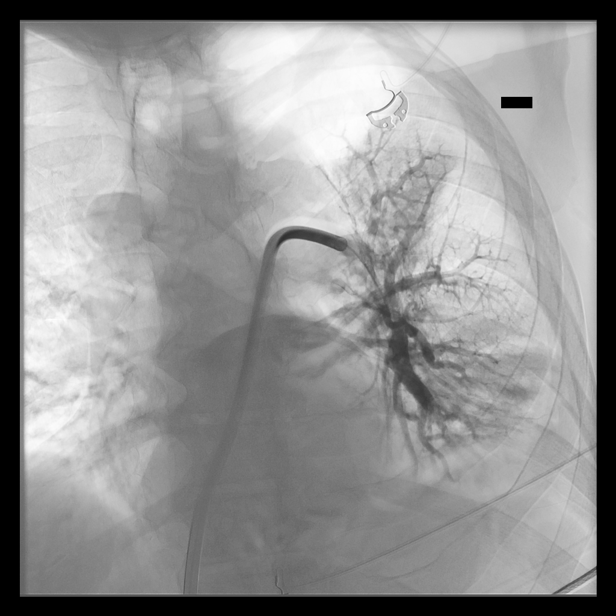

[Series 11: body 4 care · 6 acquisitions, 1 frame shown (7 of 8)]
[im 1/6]
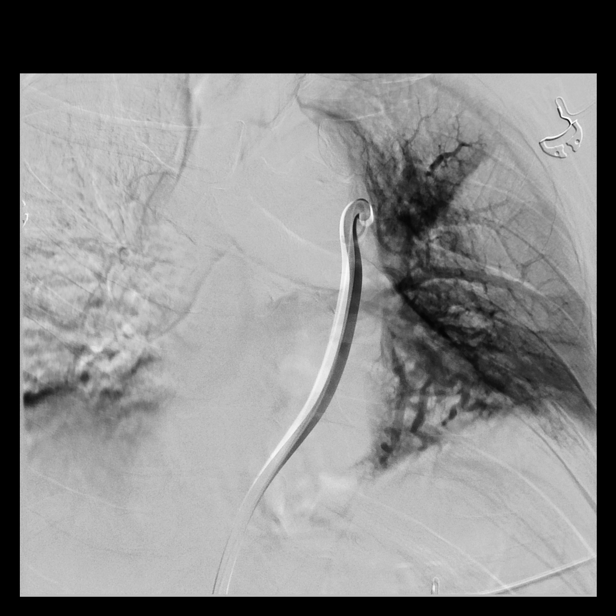

[Series 12: body 4 care · 8 acquisitions, 2 frames shown (8 of 8)]
[im 1/8]
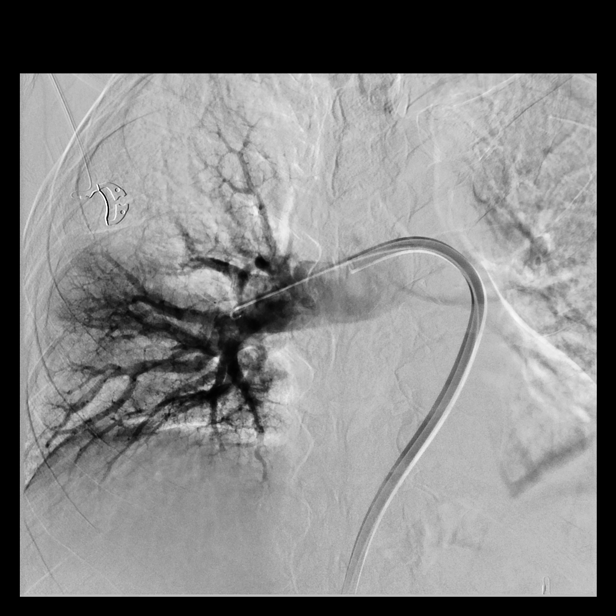
[im 8/8]
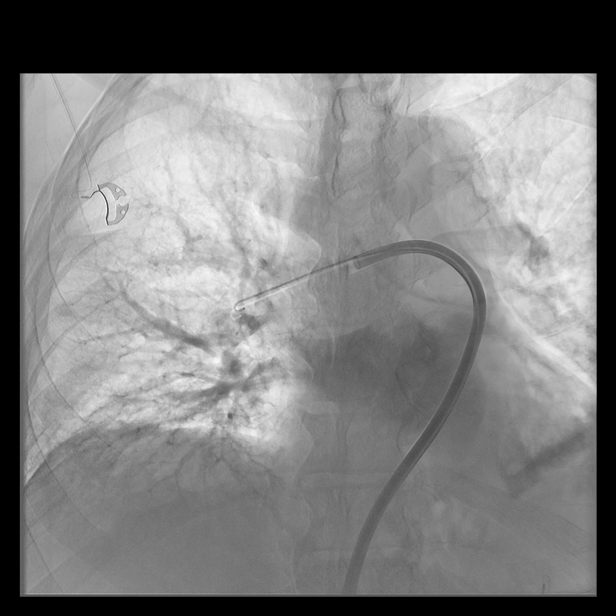

[13 of 24 positions shown; findings below may reference images not displayed]

EXAM:
Procedures:

1. ULTRASOUND GUIDANCE FOR VENOUS [D7]. PULMONARY [D7].
FLUOROSCOPIC GUIDED CATHETER DIRECTED BILATERAL PULMONARY
THROMBECTOMY

Performing Physician: JUMPER

JUMPER qualified trainee/resident or advanced practice provider (APP) was
not immediately available to assist with this case.
MEDICATIONS:
[D7] JUMPER heparin IV

ANESTHESIA/SEDATION:
Moderate (conscious) sedation was employed during this procedure. A
total of Versed 3.5 mg and Fentanyl 150 mcg was administered
intravenously.

Moderate Sedation Time: 108 minutes. The patient's level of
consciousness and vital signs were monitored continuously by
radiology nursing throughout the procedure under my direct
supervision.

CONTRAST:  100 mL Omnipaque 300

FLUOROSCOPY TIME:  28 minutes 36 seconds (274 mGy)

COMPLICATIONS:
None immediate.
Ultrasound scanning was performed of the right groin and
demonstrated wide patency of the right common femoral vein. The

right groin was prepped and draped in the usual sterile fashion, and
a sterile drape was applied covering the operative field. Maximum
barrier sterile technique with sterile gowns and gloves were used
for the procedure. A timeout was performed prior to the initiation
of the procedure. Local anesthesia was provided with 1% lidocaine.

Under direct ultrasound guidance, the RIGHT common femoral vein was
accessed with a micro puncture kit ultimately allowing placement of
a 7 Fr vascular sheath. Ultrasound and fluoroscopic spot images were
saved for procedural documentation purposes.

With the use of a 0.035 inch Bentson wire and an angled pigtail
pulmonary catheter, access past the RIGHT heart and into the main
pulmonary artery was obtained. Pre intervention central pulmonary
arteriogram was performed. The Bentson wire was exchanged for an
Amplatz wire then a 16 Fr, 33 cm JUMPER dry seal sheath then a 16 Fr
Penumbra Lightning Flash aspiration catheter was advanced into the
RIGHT then LEFT pulmonary arteries and catheter directed
thrombectomy was performed.

Intermittent and postprocedural pulmonary arteriograms were
performed confirming thrombus removal. Following adequate
angiographic result and the catheter and sheath were then removed,
and hemostasis was obtained by manual pressure. The patient
tolerated the procedure well without immediate postprocedural
complication.
FINDINGS: 1. Central pulmonary arteriogram demonstrates large burden bilateral
pulmonary emboli, partially-obstructing the pulmonary arterial
bifurcations.
2. Post intervention pulmonary arteriograms demonstrating adequate
debulking thrombectomy from the pulmonary arterial bifurcations.
3. Residual adherent thrombus was present at the pulmonary arterial
bifurcations.
4. On-table clinical improvement of the patient, including HR, SaO2
and O2 requirement metrics.
IMPRESSION: 1. Pulmonary arteriogram revealing large burden bilateral pulmonary
emboli, partially obstructing the pulmonary arterial bifurcations.
2. RIGHT heart dilation and dilated main PA compatible with
pulmonary arterial hypertension.
3. Successful catheter-directed bilateral pulmonary arterial
mechanical thrombectomy, with adequate debulking and minimal
residual adherent thrombus.

PLAN:
1. Continue systemic anticoagulation with heparin gtt and transition
to DOAC when clinically feasible.
2. Asymptomatic LEFT lower extremity DVT. No JUMPER intervention
recommended at this time.

## 2021-05-11 IMAGING — CT CT VENOGRAM ABD-PELV
2 of 9 series · 11 of 46 positions shown, 17 images · IV contrast (agent unspecified)
Comparison: CTA PE and chest XR, earlier same day. CT AP,
[DATE].

CLINICAL DATA: Deep vein thrombosis with PE

EXAM:
CT VENOGRAM ABDOMEN AND PELVIS WITH CONTRAST
TECHNIQUE: Multidetector CT imaging of the abdomen and pelvis was performed
using the standard protocol following bolus administration of
intravenous contrast.

[Series 2: axial portal venous · axial · portal-venous · 0.77mm/px · z∈[+1073,+1593]mm · 9 of 128 slices shown, 15 images]
[im 12/128  soft-tissue]
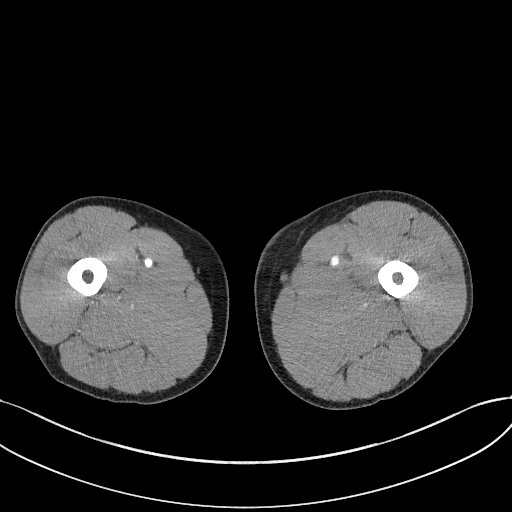
[im 12/128  bone]
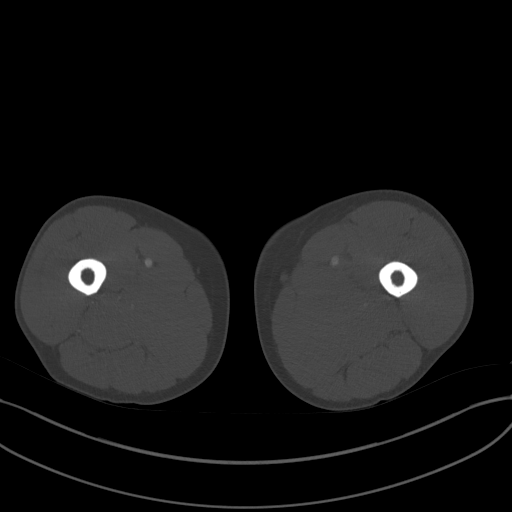
[im 24/128  soft-tissue]
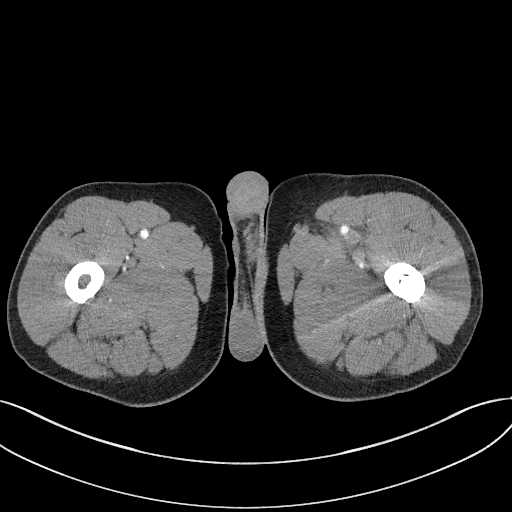
[im 35/128  soft-tissue]
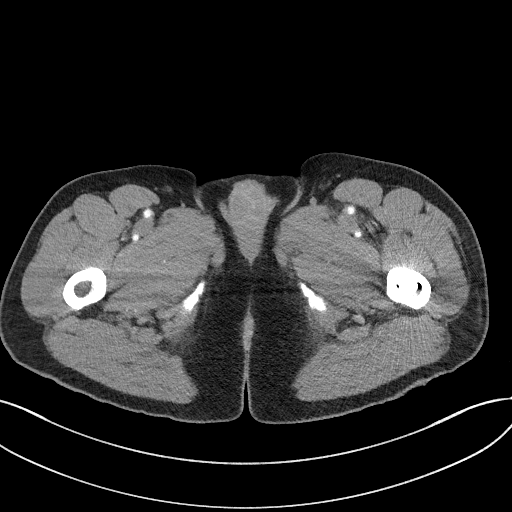
[im 47/128  soft-tissue]
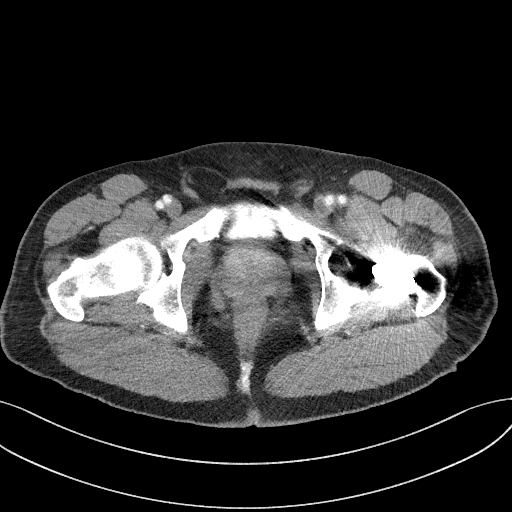
[im 70/128  soft-tissue]
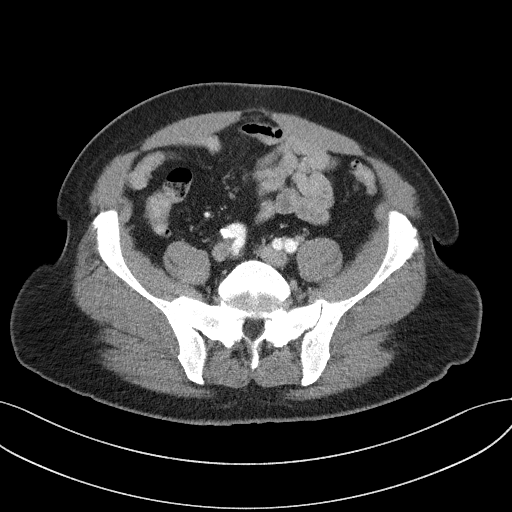
[im 81/128  soft-tissue]
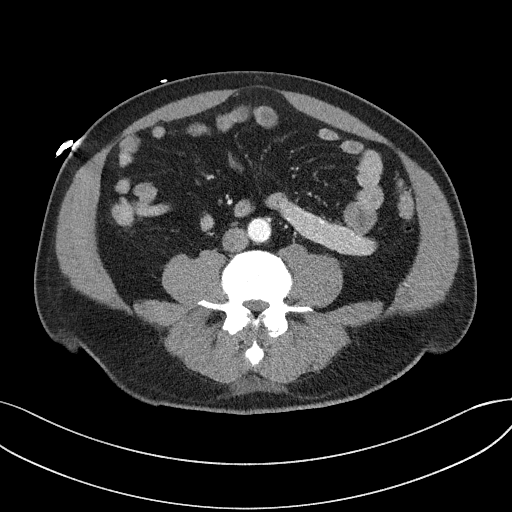
[im 81/128  lung]
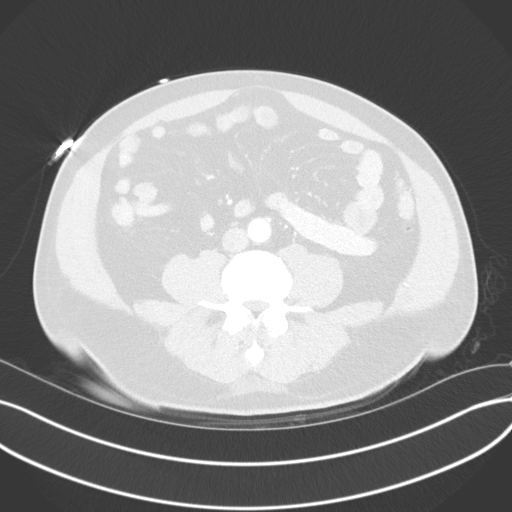
[im 93/128  soft-tissue]
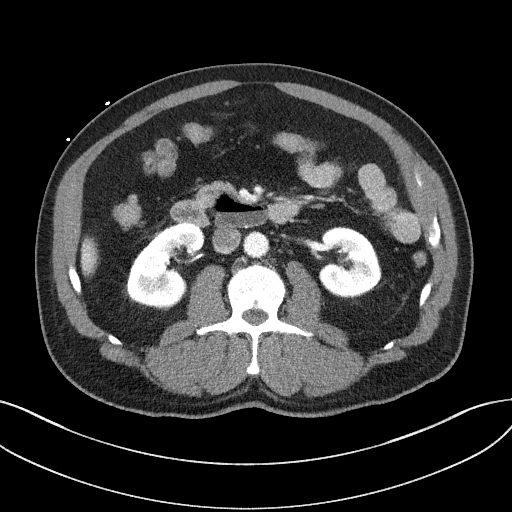
[im 93/128  lung]
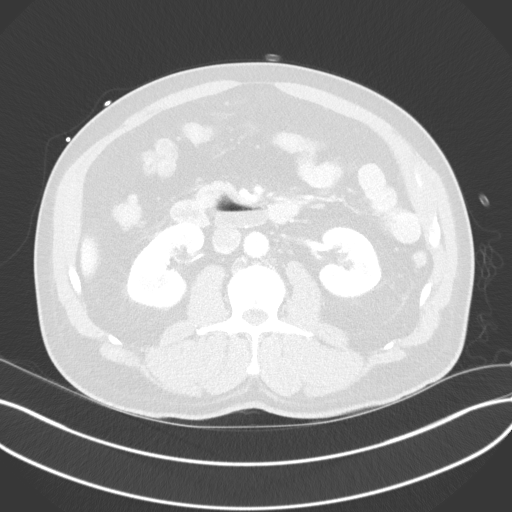
[im 104/128  soft-tissue]
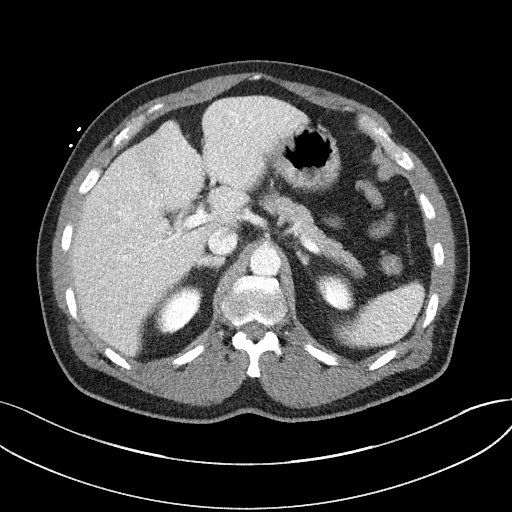
[im 104/128  lung]
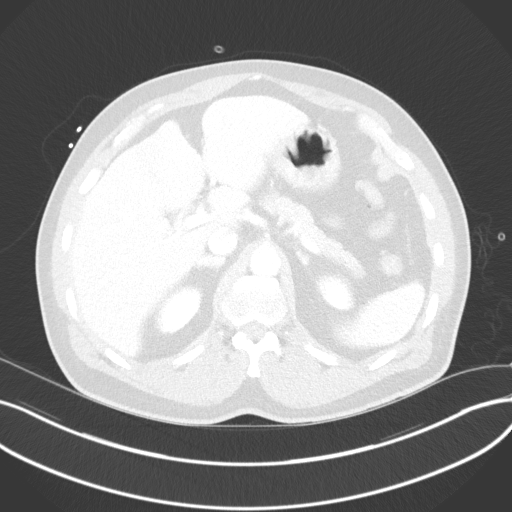
[im 116/128  soft-tissue]
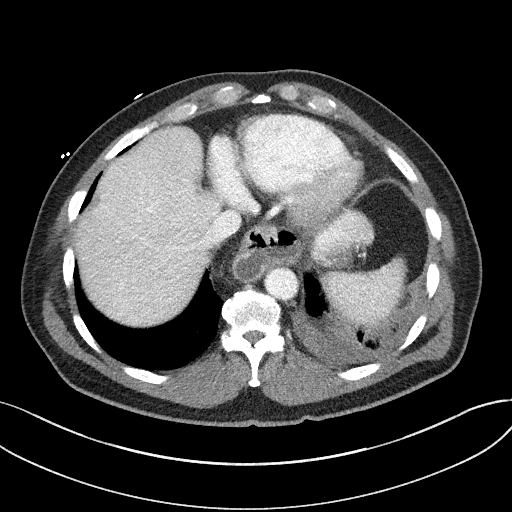
[im 116/128  lung]
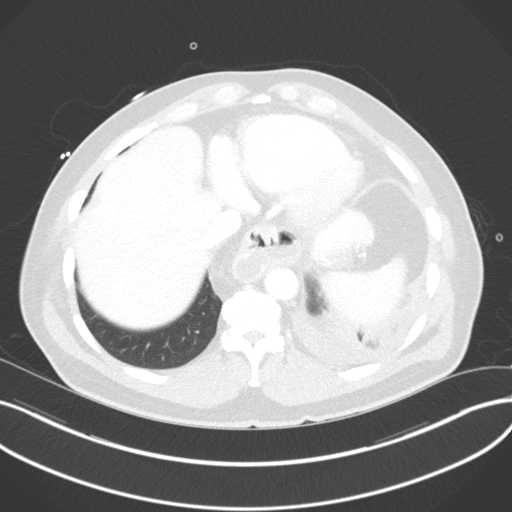
[im 116/128  bone]
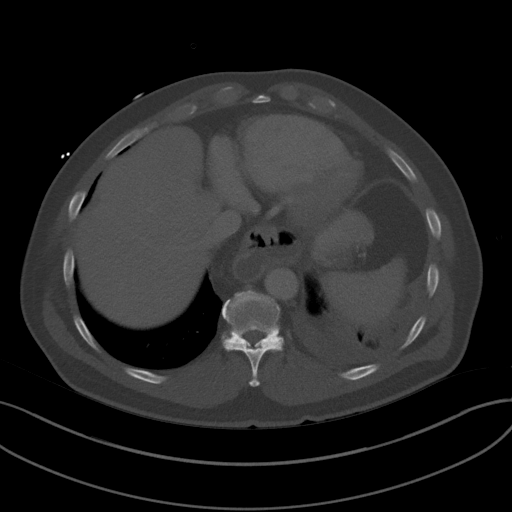

[Series 3: coronals · coronal · 0.75mm/px · 2 of 142 slices shown]
[im 48/142  soft-tissue]
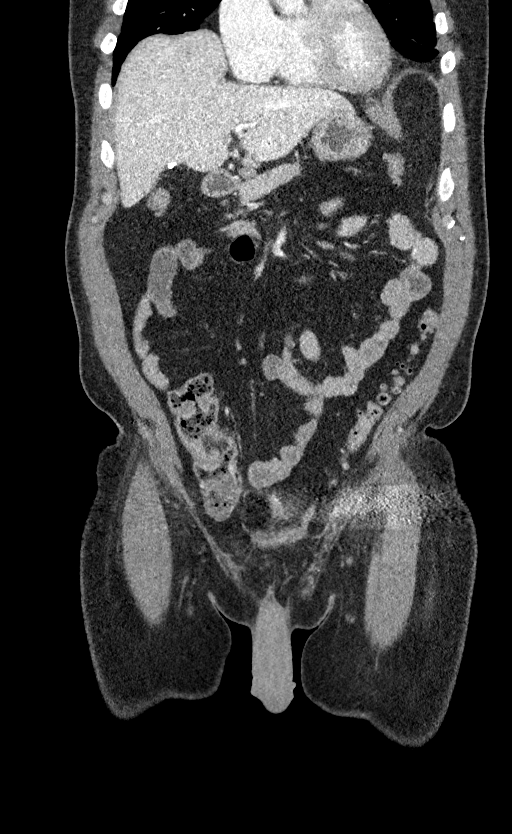
[im 95/142  soft-tissue]
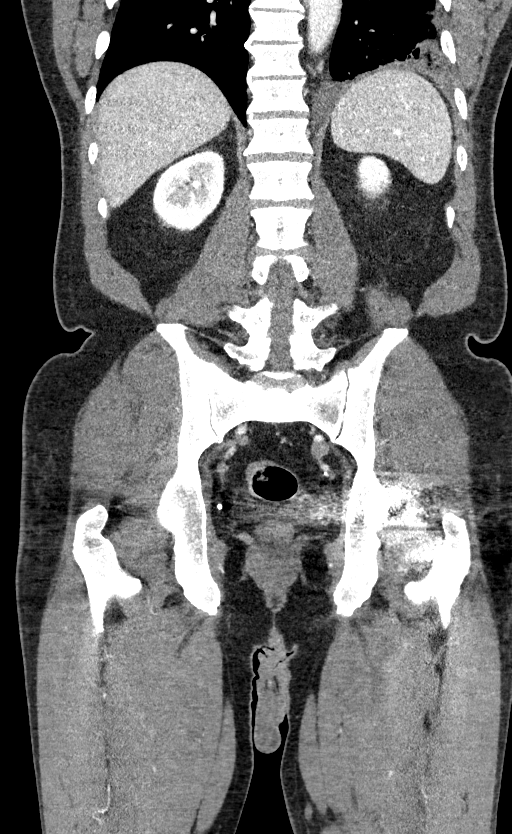

[11 of 46 positions shown; findings below may reference images not displayed]

RADIATION DOSE REDUCTION: This exam was performed according to the
departmental dose-optimization program which includes automated
exposure control, adjustment of the mA and/or kV according to
patient size and/or use of iterative reconstruction technique.

CONTRAST:  100mL OMNIPAQUE IOHEXOL 350 MG/ML SOLN
FINDINGS: Lower chest: Ground-glass LEFT lower lobe pulmonary consolidation,
better appreciated and described on recent dedicated CTA chest.

Hepatobiliary: [21] cm hypodensities within the LEFT hepatic lobe,
similar in appearance [DATE] comparison, and too small to
adequately characterize though likely small cysts versus
hemangiomas. 1.8 cm area of hyperattenuation within the LEFT hepatic
lobe without discrete mass lesion, likely TEJE CYPRUS. Gallbladder is
surgically absent. No biliary dilatation.

Pancreas: No pancreatic ductal dilatation or surrounding
inflammatory changes.

Spleen: Normal in size without focal abnormality.

Adrenals/Urinary Tract: Adrenal glands are unremarkable. Kidneys are
normal, without renal calculi, focal lesion, or hydronephrosis.
Bladder is unremarkable.

Stomach/Bowel: Moderate hiatus hernia, similar appearance to [21]
comparison. 4.1 cm duodenal diverticulum. Appendix appears normal.
Moderate burden of descending colonic and proximal sigmoid
diverticulosis. No evidence of bowel wall thickening, distention, or
inflammatory changes.

Vascular/Lymphatic: No enlarged abdominal or pelvic lymph nodes.

*Occlusive proximal LEFT lower extremity DVT, as seen within the
LEFT CFV, SFV and imaged portions of the popliteal vein.
*Limited visualization of the LEFT external iliac vein secondary to
streak artifact from LEFT hip arthroplasty, however suspected
involvement of the LEFT EIV just central to the inguinal ligament.
See key images.

Reproductive: Prostate measures at the upper limit of normal in
size, at 5.0 cm.

Other: Small fat-filled RIGHT inguinal hernia versus cord lipoma.
Small, broad-based fat-containing umbilical hernia. No
abdominopelvic ascites.

Musculoskeletal: LEFT hip arthroplasty.  No acute osseous findings.
IMPRESSION: Suboptimal evaluation, secondary to streak artifact from LEFT hip
arthroplasty within these constraints;

1. Occlusive, proximal LEFT lower extremity DVT involving the
peripheral LEFT EIV, CFV, SFV and imaged portions of the popliteal
vein.
2. Moderate hiatus hernia, mild prostatomegaly and moderate sigmoid
diverticulosis.
3. 1.8 cm area of hyperattenuation within LEFT hepatic lobe, without
discrete mass lesion, favor TEJE CYPRUS.
Additional incidental, chronic and senescent findings as above.

## 2021-05-11 MED ORDER — POLYETHYLENE GLYCOL 3350 17 G PO PACK: 17.0000 g | PACK | Freq: Every day | ORAL | Status: DC | PRN

## 2021-05-11 MED ORDER — FENTANYL CITRATE (PF) 100 MCG/2ML IJ SOLN
INTRAMUSCULAR | Status: AC | PRN
Start: 2021-05-11 — End: 2021-05-11
  Administered 2021-05-11: 25 ug via INTRAVENOUS
  Administered 2021-05-11: 50 ug via INTRAVENOUS
  Administered 2021-05-11: 25 ug via INTRAVENOUS
  Administered 2021-05-11: 50 ug via INTRAVENOUS

## 2021-05-11 MED ORDER — IOHEXOL 350 MG/ML SOLN
80.0000 mL | Freq: Once | INTRAVENOUS | Status: AC | PRN
  Administered 2021-05-11: 80 mL via INTRAVENOUS

## 2021-05-11 MED ORDER — FENTANYL CITRATE (PF) 100 MCG/2ML IJ SOLN
INTRAMUSCULAR | Status: AC
  Filled 2021-05-11: qty 4

## 2021-05-11 MED ORDER — HEPARIN SODIUM (PORCINE) 1000 UNIT/ML IJ SOLN
INTRAMUSCULAR | Status: AC
  Filled 2021-05-11: qty 10

## 2021-05-11 MED ORDER — MIDAZOLAM HCL 2 MG/2ML IJ SOLN
INTRAMUSCULAR | Status: AC
  Filled 2021-05-11: qty 4

## 2021-05-11 MED ORDER — IOHEXOL 350 MG/ML SOLN
100.0000 mL | Freq: Once | INTRAVENOUS | Status: AC | PRN
  Administered 2021-05-11: 100 mL via INTRAVENOUS

## 2021-05-11 MED ORDER — SODIUM CHLORIDE (PF) 0.9 % IJ SOLN
INTRAMUSCULAR | Status: AC
Start: 2021-05-11 — End: 2021-05-12
  Filled 2021-05-11: qty 50

## 2021-05-11 MED ORDER — DEXTROSE-NACL 5-0.45 % IV SOLN: INTRAVENOUS | Status: DC

## 2021-05-11 MED ORDER — HEPARIN SODIUM (PORCINE) 1000 UNIT/ML IJ SOLN
INTRAMUSCULAR | Status: AC | PRN
  Administered 2021-05-11: 3000 [IU] via INTRAVENOUS

## 2021-05-11 MED ORDER — CHLORHEXIDINE GLUCONATE CLOTH 2 % EX PADS
6.0000 | MEDICATED_PAD | Freq: Every day | CUTANEOUS | Status: DC
  Administered 2021-05-11 – 2021-05-13 (×3): 6 via TOPICAL

## 2021-05-11 MED ORDER — HEPARIN BOLUS VIA INFUSION
2500.0000 [IU] | Freq: Once | INTRAVENOUS | Status: AC
  Administered 2021-05-11: 2500 [IU] via INTRAVENOUS
  Filled 2021-05-11: qty 2500

## 2021-05-11 MED ORDER — DOCUSATE SODIUM 100 MG PO CAPS: 100.0000 mg | ORAL_CAPSULE | Freq: Two times a day (BID) | ORAL | Status: DC | PRN

## 2021-05-11 MED ORDER — HEPARIN (PORCINE) 25000 UT/250ML-% IV SOLN
1250.0000 [IU]/h | INTRAVENOUS | Status: AC
  Administered 2021-05-11: 13:00:00 1350 [IU]/h via INTRAVENOUS
  Administered 2021-05-12: 1250 [IU]/h via INTRAVENOUS
  Filled 2021-05-11 (×2): qty 250

## 2021-05-11 MED ORDER — IOHEXOL 300 MG/ML  SOLN: 100.0000 mL | Freq: Once | INTRAMUSCULAR | Status: DC | PRN

## 2021-05-11 MED ORDER — PANTOPRAZOLE SODIUM 40 MG IV SOLR
40.0000 mg | Freq: Every day | INTRAVENOUS | Status: DC
  Administered 2021-05-11: 40 mg via INTRAVENOUS

## 2021-05-11 MED ORDER — LIDOCAINE HCL 1 % IJ SOLN
INTRAMUSCULAR | Status: AC
  Filled 2021-05-11: qty 20

## 2021-05-11 MED ORDER — MIDAZOLAM HCL 2 MG/2ML IJ SOLN
INTRAMUSCULAR | Status: AC | PRN
  Administered 2021-05-11: .5 mg via INTRAVENOUS
  Administered 2021-05-11 (×3): 1 mg via INTRAVENOUS

## 2021-05-11 NOTE — Procedures (Signed)
[facility] Procedure Note ? ?Patient: Juan Snow ?DOB: 1955/03/23 ?Medical Record Number: [id number] ?Note Date/Time: 05/11/21 8:34 PM  ? ?Performing Physician: [name], MD ?Assistant(s): Markus Daft, MD ? ?Diagnosis: bilateral PE. R heart strain.  ? ?Procedure:  ?PULMONARY ARTERIOGRAPHY ?MECHANICAL THROMBECTOMY, Catheter-directed ? ?Anesthesia: Conscious Sedation ?Complications: None ?Estimated Blood Loss:  450 mL ?Specimens: None ? ?Findings:  ?- access via the Right femoral vein. ?- PE burden at pulmonary arterial bifurcations ?- Successful mechanical thrombectomy with Penumbra Lightning 16 Flash ?- Good angiographic result and improved Pt parameters (HR, O2 requirement and SaO2 at the end of the case ? ?Final report to follow once all images are reviewed and compared with previous studies. ? ?See detailed dictation with images in PACS. ?The patient tolerated the procedure well without incident or complication and was returned to ICU in stable condition.  ? ? ?[name], MD ?[facility] ?[facility] ? ? ?Pager. [phone] ?Clinic. [phone]  ?

## 2021-05-11 NOTE — ED Notes (Signed)
Patient transported to CT 

## 2021-05-11 NOTE — Sedation Documentation (Signed)
Vital signs stable. 

## 2021-05-11 NOTE — Consult Note (Signed)
Chief Complaint: Pulmonary embolism  Referring Physician(s): Ramaswamy  Supervising Physician: [name]  Patient Status: [facility] - In-pt  History of Present Illness: Juan Snow is a 66 y.o. male who is s/p laparoscopic cholecystectomy on 03/30/2021.  He then traveled to [place] and back about a week ago (3 hour flight).  He noticed his left leg was swelling and he became progressively more short of breath.  He was brought to the ED by his wife.  In the ED he was hypoxemic and required 2-3 L of O2 via [place].  CTA showed = Positive for acute PE with CT evidence of right heart strain (RV/LV Ratio = 1.75) consistent with at least submassive (intermediate risk) PE.   Troponin 562  Venous doppler showed left Iliac vein DVT as well.  We are asked to evaluate him for thrombectomy.  Past Medical History:  Diagnosis Date   Arthritis    OA   Cough    WHITE SPUTUM OCC, NO FEVER COLD SYMPTOMS MAINLY   GERD (gastroesophageal reflux disease)     Past Surgical History:  Procedure Laterality Date   CHOLECYSTECTOMY N/A 03/30/2021   Procedure: LAPAROSCOPIC CHOLECYSTECTOMY WITH INTRAOPERATIVE CHOLANGIOGRAM;  Surgeon: Armandina Gemma, MD;  Location: [facility] ORS;  Service: General;  Laterality: N/A;   COLONOSCOPY  06/17/2011   Procedure: COLONOSCOPY;  Surgeon: [name], MD;  Location: [facility];  Service: Endoscopy;  Laterality: N/A;   KNEE SURGERY Right YRS AGO   MENISCUS   THUMB ARTHROSCOPY Right YRS AGO   TOTAL HIP ARTHROPLASTY Left 06/25/2017   Procedure: LEFT TOTAL HIP ARTHROPLASTY ANTERIOR APPROACH;  Surgeon: Gaynelle Arabian, MD;  Location: [facility];  Service: Orthopedics;  Laterality: Left;    Allergies: Patient has no known allergies.  Medications: Prior to Admission medications   Medication Sig Start Date End Date Taking? Authorizing Provider  acetaminophen (TYLENOL) 500 MG tablet Take 500 mg by mouth every 6 (six) hours as needed (for pain).   Yes [provider]  aspirin EC 81 MG tablet Take 162 mg by mouth daily as needed.   Yes [provider]  atorvastatin (LIPITOR) 10 MG tablet Take 1 tablet (10 mg total) by mouth daily. 01/11/21  Yes   Cholecalciferol (VITAMIN D3) 50 MCG (2000 UT) TABS Take 6,000 Units by mouth daily.   Yes [provider]  Coenzyme Q10 (CO Q-10 PO) Take 1 capsule by mouth daily.   Yes [provider]  ibuprofen (ADVIL) 200 MG tablet Take 600 mg by mouth every 6 (six) hours as needed (for pain).   Yes [provider]  Menaquinone-7 (VITAMIN K2 PO) Take 1 tablet by mouth daily.   Yes [provider]  Omega-3 Fatty Acids (FISH OIL) 1000 MG CAPS Take 1,000 capsules by mouth daily.   Yes [provider]  pantoprazole (PROTONIX) 40 MG tablet Take 1 tablet (40 mg total) by mouth daily. 03/31/21  Yes [name], MD  rivaroxaban (XARELTO) 10 MG TABS tablet Take 1 tablet (10 mg total) by mouth daily with breakfast. Patient not taking: Reported on 03/27/2021 06/26/17   [place], [employer], PA-C  vitamin C (ASCORBIC ACID) 500 MG tablet Take 500-1,000 mg by mouth daily. Patient not taking: Reported on 05/11/2021    [provider]     Family History  Problem Relation Age of Onset   CAD Brother    Hypertension Other    Diabetes Neg Hx     Social History   Socioeconomic  History   Marital status: Married    Spouse name: Not on file   Number of children: Not on file   Years of education: Not on file   Highest education level: Not on file  Occupational History   Not on file  Tobacco Use   Smoking status: Never   Smokeless tobacco: Never  Vaping Use   Vaping Use: Never used  Substance and Sexual Activity   Alcohol use: No   Drug use: No   Sexual activity: Not on file  Other Topics Concern   Not on file  Social History Narrative   Not on file   Social Determinants of Health   Financial Resource Strain: Not on file  Food Insecurity: Not on  file  Transportation Needs: Not on file  Physical Activity: Not on file  Stress: Not on file  Social Connections: Not on file     Review of Systems: A 12 point ROS discussed and pertinent positives are indicated in the HPI above.  All other systems are negative.  Review of Systems  Vital Signs: BP (!) 118/93    Pulse (!) 106    Temp 97.9 F (36.6 C) (Oral)    Resp 19    Ht 5' 7.5" (1.715 m)    Wt 180 lb (81.6 kg)    SpO2 97%    BMI 27.78 kg/m   Physical Exam Constitutional:      Appearance: Normal appearance.  HENT:     Head: Normocephalic and atraumatic.  Cardiovascular:     Rate and Rhythm: Regular rhythm. Tachycardia present.  Pulmonary:     Comments: Tachypneic  Musculoskeletal:        General: Swelling present.     Comments: 2+ Left lower extremity edema  Skin:    General: Skin is warm and dry.  Neurological:     General: No focal deficit present.     Mental Status: He is alert and oriented to person, place, and time.  Psychiatric:        Mood and Affect: Mood normal.        Behavior: Behavior normal.        Thought Content: Thought content normal.        Judgment: Judgment normal.    Imaging: CT Angio Chest PE W and/or Wo Contrast  Result Date: 05/11/2021 CLINICAL DATA:  Shortness of breath and left leg swelling. Gallbladder surgery 5 weeks ago. EXAM: CT ANGIOGRAPHY CHEST WITH CONTRAST TECHNIQUE: Multidetector CT imaging of the chest was performed using the standard protocol during bolus administration of intravenous contrast. Multiplanar CT image reconstructions and MIPs were obtained to evaluate the vascular anatomy. RADIATION DOSE REDUCTION: This exam was performed according to the departmental dose-optimization program which includes automated exposure control, adjustment of the mA and/or kV according to patient size and/or use of iterative reconstruction technique. CONTRAST:  66m OMNIPAQUE IOHEXOL 350 MG/ML SOLN COMPARISON:  None. FINDINGS: Cardiovascular: The  heart is normal in size. No pericardial effusion. The aorta is normal in caliber. No dissection. Minimal scattered atherosclerotic calcifications. The pulmonary arterial tree is fairly well opacified. There is submassive bilateral pulmonary emboli. Associated significant right heart strain with RV LV ratio of much greater than 1. The left ventricle is flattened and there is some contrast coursing down the IVC and into the hepatic veins. Mediastinum/Nodes: No mediastinal or hilar mass or adenopathy. The esophagus is grossly normal. There is a moderate to large hiatal hernia noted. Lungs/Pleura: Small left pleural effusion and overlying  atelectasis. Peripheral rounded density in the left lower lobe could be atelectasis or pulmonary infarct. Similar findings the peripheral aspect of the right upper lobe. Right apical cavitary process measuring a maximum of 5 cm. This could represent chronic post pneumonic scarring changes but could not exclude neoplasm. Recommend pulmonary consultation and possible PET scan. Upper Abdomen: No significant upper abdominal findings. The gallbladder is surgically absent. Musculoskeletal: No chest wall mass, supraclavicular axillary adenopathy. The bony thorax is intact. Review of the MIP images confirms the above findings. IMPRESSION: 1. Positive for acute PE with CT evidence of right heart strain (RV/LV Ratio = 1.75) consistent with at least submassive (intermediate risk) PE. The presence of right heart strain has been associated with an increased risk of morbidity and mortality. Please refer to the "PE Focused" order set in EPIC. 2. Small left pleural effusion and overlying atelectasis. 3. Peripheral rounded density in the left lower lobe and right upper lobe could be atelectasis or pulmonary infarct. 4. 5 cm cavitary process in the right lung apex. This could represent chronic post pneumonic scarring changes but could not exclude neoplasm. Recommend pulmonary consultation and possible  PET scan. 5. Moderate to large hiatal hernia. 6. Aortic atherosclerosis. These results were called by telephone at the time of interpretation on 05/11/2021 at 12:40 pm to provider DAN FLOYD , who verbally acknowledged these results. Aortic Atherosclerosis (ICD10-I70.0). Electronically Signed   By: Marijo Sanes M.D.   On: 05/11/2021 12:40   VAS [place] IVC/ILIAC (VENOUS ONLY)  Result Date: 05/11/2021 IVC/ILIAC STUDY Patient Name:  Juan Snow  Date of Exam:   05/11/2021 Medical Rec #: [id number]        Accession #:    [id number] Date of Birth: 01-May-1955         Patient Gender: M Patient Age:   21 years Exam Location:  [facility] Procedure:      VAS [place] IVC/ILIAC (VENOUS ONLY) Referring Phys: --------------------------------------------------------------------------------  Limitations: Air/bowel gas and obesity.  Comparison Study: No previous exam Performing Technologist: Jody Hill RVT, RDMS  Examination Guidelines: A complete evaluation includes B-mode imaging, spectral Doppler, color Doppler, and power Doppler as needed of all accessible portions of each vessel. Bilateral testing is considered an integral part of a complete examination. Limited examinations for reoccurring indications may be performed as noted.  IVC/Iliac Findings: +----------+------+--------+--------------+     IVC     Patent Thrombus    Comments     +----------+------+--------+--------------+  IVC Prox                   not visualized  +----------+------+--------+--------------+  IVC Mid                    not visualized  +----------+------+--------+--------------+  IVC Distal patent                          +----------+------+--------+--------------+  +----------------+---------+-----------+---------+-----------+----------------+        CIV        RT-Patent RT-Thrombus LT-Patent LT-Thrombus     Comments      +----------------+---------+-----------+---------+-----------+----------------+  Common Iliac      patent                             acute                       Prox                                                                           +----------------+---------+-----------+---------+-----------+----------------+  Common Iliac Mid  patent                                     not visualized L  +----------------+---------+-----------+---------+-----------+----------------+  Common Iliac                                                 not visualized R   Distal                                                             & L         +----------------+---------+-----------+---------+-----------+----------------+  +-------------------------+---------+-----------+---------+-----------+--------+             EIV            RT-Patent RT-Thrombus LT-Patent LT-Thrombus Comments  +-------------------------+---------+-----------+---------+-----------+--------+  External Iliac Vein Prox                                     acute              +-------------------------+---------+-----------+---------+-----------+--------+  External Iliac Vein Mid                                      acute              +-------------------------+---------+-----------+---------+-----------+--------+  External Iliac Vein                                          acute               Distal                                                                          +-------------------------+---------+-----------+---------+-----------+--------+   Summary: IVC/Iliac: There is no evidence of thrombus involving the distal portion of the IVC. There is no evidence of thrombus involving the right common iliac vein. There is evidence of acute thrombus involving the left common iliac vein. There is no evidence of  thrombus involving the right external iliac vein. There is evidence of acute thrombus involving the left external iliac vein. Visualization of proximal Inferior Vena Cava, mid inferior vena cava, distal common Iliacs and left mid common Iliac was limited.  *See table(s) above for  measurements and observations.   Preliminary    DG Chest Port 1 View  Result Date: 05/11/2021 CLINICAL DATA:  cough, hypoxia EXAM: PORTABLE CHEST 1 VIEW COMPARISON:  03/27/2021. FINDINGS: New left lateral basilar opacities, suspicious for small pleural effusion with overlying consolidation. Right lung is clear. No visible pneumothorax. IMPRESSION:  New left lateral basilar opacities, suspicious for small pleural effusion with overlying pneumonia. Followup PA and lateral chest X-ray is recommended in 3-4 weeks following trial of antibiotic therapy to ensure resolution and exclude underlying malignancy. Electronically Signed   By: Margaretha Sheffield M.D.   On: 05/11/2021 11:13   VAS [place] LOWER EXTREMITY VENOUS (DVT)  Result Date: 05/11/2021 IVC/ILIAC STUDY Patient Name:  Juan Snow  Date of Exam:   05/11/2021 Medical Rec #: [id number]        Accession #:    [id number] Date of Birth: 1955-12-04         Patient Gender: M Patient Age:   34 years Exam Location:  [facility] Procedure:      VAS US AORTA/IVC/ILIACS Referring Phys: --------------------------------------------------------------------------------  Limitations: Air/bowel gas and obesity.  Comparison Study: No previous exam Performing Technologist: Jody Hill RVT, RDMS  Examination Guidelines: A complete evaluation includes B-mode imaging, spectral Doppler, color Doppler, and power Doppler as needed of all accessible portions of each vessel. Bilateral testing is considered an integral part of a complete examination. Limited examinations for reoccurring indications may be performed as noted.  IVC/Iliac Findings: +----------+------+--------+--------------+     IVC     Patent Thrombus    Comments     +----------+------+--------+--------------+  IVC Prox                   not visualized  +----------+------+--------+--------------+  IVC Mid                    not visualized  +----------+------+--------+--------------+  IVC Distal patent                           +----------+------+--------+--------------+  +----------------+---------+-----------+---------+-----------+----------------+        CIV        RT-Patent RT-Thrombus LT-Patent LT-Thrombus     Comments      +----------------+---------+-----------+---------+-----------+----------------+  Common Iliac      patent                            acute                       Prox                                                                           +----------------+---------+-----------+---------+-----------+----------------+  Common Iliac Mid  patent                                     not visualized L  +----------------+---------+-----------+---------+-----------+----------------+  Common Iliac                                                 not visualized R   Distal                                                             &  L         +----------------+---------+-----------+---------+-----------+----------------+  +-------------------------+---------+-----------+---------+-----------+--------+             EIV            RT-Patent RT-Thrombus LT-Patent LT-Thrombus Comments  +-------------------------+---------+-----------+---------+-----------+--------+  External Iliac Vein Prox                                     acute              +-------------------------+---------+-----------+---------+-----------+--------+  External Iliac Vein Mid                                      acute              +-------------------------+---------+-----------+---------+-----------+--------+  External Iliac Vein                                          acute               Distal                                                                          +-------------------------+---------+-----------+---------+-----------+--------+   Summary: IVC/Iliac: There is no evidence of thrombus involving the distal portion of the IVC. There is no evidence of thrombus involving the right common iliac vein. There is evidence of acute thrombus  involving the left common iliac vein. There is no evidence of  thrombus involving the right external iliac vein. There is evidence of acute thrombus involving the left external iliac vein. Visualization of proximal Inferior Vena Cava, mid inferior vena cava, distal common Iliacs and left mid common Iliac was limited.  *See table(s) above for measurements and observations.   Preliminary     Labs:  CBC: Recent Labs    03/29/21 0358 03/30/21 0420 03/31/21 0430 05/11/21 1052 05/11/21 1102  WBC 5.5 5.6 7.0 17.4*  --   HGB 14.4 14.1 14.1 14.2 15.3  HCT 42.9 41.3 42.1 44.4 45.0  PLT 134* 146* 168 157  --     COAGS: No results for input(s): INR, APTT in the last 8760 hours.  BMP: Recent Labs    03/29/21 0358 03/30/21 0420 03/31/21 0430 05/11/21 1052 05/11/21 1102  NA 139 139 138 136 138  K 4.0 3.7 4.4 3.8 4.2  CL 108 108 107 101 101  CO2 '24 23 25 24  '$ --   GLUCOSE 107* 109* 123* 148* 149*  BUN '22 21 17 20 22  '$ CALCIUM 8.6* 8.6* 8.6* 9.3  --   CREATININE 1.52* <0.30* 1.27* 1.14 1.20  GFRNONAA 51* NOT CALCULATED >60 >60  --     LIVER FUNCTION TESTS: Recent Labs    03/29/21 0358 03/30/21 0420 03/31/21 0430 05/11/21 1052  BILITOT 2.7* 1.6* 0.8 0.4  AST 27 14* 19 34  ALT 56* 38 38 74*  ALKPHOS 92 79 74 88  PROT 6.0* 5.9* 5.9* 7.4  ALBUMIN 3.2* 2.9* 2.8* 3.5  TUMOR MARKERS: No results for input(s): AFPTM, CEA, CA199, CHROMGRNA in the last 8760 hours.  Assessment and Plan:  Acute PE in the setting of cholecystectomy follow by air travel.  Images reviewed by Dr. Maryelizabeth Kaufmann and Dr. Anselm Pancoast.  Will proceed with PE thrombectomy today.  Risks and benefits of PE thrombectomy discussed with the patient including, but not limited to bleeding, pulmonary hemorrhage, perforation or dissection of cardiovascular structures, vascular injury, stroke, contrast induced renal failure, and infection.  All of the patient's questions were answered, patient is agreeable to proceed. Consent  signed and in chart.  Thank you for allowing our service to participate in Juan Snow 's care.  Electronically Signed: Murrell Redden, PA-C   05/11/2021, 3:04 PM      I spent a total of 40 Minutes in face to face in clinical consultation, greater than 50% of which was counseling/coordinating care for PE thrombectomy.

## 2021-05-11 NOTE — H&P (Signed)
NAME:  Juan Snow, MRN:  [id number], DOB:  Feb 06, 1956, LOS: 0 ADMISSION DATE:  05/11/2021 CONSULTATION DATE:  05/11/2021 REFERRING MD:  Tyrone Nine - EDP CHIEF COMPLAINT:  Acute pulmonary embolus   History of Present Illness:  66 year old man who presented to [facility] ED 3/10 for worsening shortness of breath.  PMHx significant for osteoarthritis and GERD.  Recently underwent laparoscopic cholecystectomy with Dr. Harlow Asa 03/30/2021.  Per patient/wife at bedside, patient had reported intermittent SOB since surgery 1/27 with intermittent swelling of his left lower extremity.  They recently took a trip to [place] to celebrate patient's retirement and returned 3/5.  Since then, he has had increasing SOB prompting him to present to the ED today.  Endorses mild CP/SOB, dyspnea, swelling of left lower extremity.  Denies fever/chills, N/V, abdominal pain, dizziness, headache.  Endorses recent surgery and recent travel as above.  Denies tobacco use or exogenous hormone use such as testosterone.  No history of cancer. Denies known family history or personal history of VTE.  On ED arrival, patient was tachycardic to 110s, tachypneic to 20s with O2 sat 97% and normotensive.  CXR demonstrated new left lateral basilar opacities. CTA Chest was positive for acute PE with evidence of R heart strain (RV/LV ratio 1.75).  Heparin gtt was initiated. Interventional radiology was consulted.  PCCM consulted for admission with plan for transfer to [facility] for intervention.  Pertinent Medical History:   Past Medical History:  Diagnosis Date   Arthritis    OA   Cough    WHITE SPUTUM OCC, NO FEVER COLD SYMPTOMS MAINLY   GERD (gastroesophageal reflux disease)    Significant Hospital Events: Including procedures, antibiotic start and stop dates in addition to other pertinent events   3/10 - Presented to [facility] ED with persistent/worsening SOB.  Found to have acute saddle PE with evidence of right heart strain.  IR consulted.  PCCM  consulted for admission. Echo obtained, heparin gtt started.  Interim History / Subjective:  PCCM consulted for admission, given high risk submassive PE  Objective:  Blood pressure (!) 138/117, pulse (!) 113, temperature 97.9 F (36.6 C), temperature source Oral, resp. rate (!) 27, height 5' 7.5" (1.715 m), weight 81.6 kg, SpO2 100 %.       No intake or output data in the 24 hours ending 05/11/21 1336 Filed Weights   05/11/21 1031  Weight: 81.6 kg   Physical Examination: General: Acutely ill-appearing middle-aged man in NAD.  Appears uncomfortable. HEENT: [place]/AT, anicteric sclera, PERRL, moist mucous membranes. Neuro: Awake, oriented x 4. Responds to verbal stimuli. Following commands consistently. Moves all 4 extremities spontaneously. CV: Tachycardic, normal sinus rhythm, no m/g/r. PULM: Breathing even and mildly labored on 3L [place]. Lung fields CTAB. GI: Soft, nontender, nondistended. Normoactive bowel sounds. Extremities: Unilateral 2+ LLE edema noted. Trace edema of RLE.  No erythema. Skin: Warm/dry, no rashes.  Resolved Hospital Problem List:    Assessment & Plan:   Acute submassive pulmonary embolus with high risk features Admitted with SOB. CTA demonstrating acute submassive) PE with evidence of R heart strain (RV/LV Ratio 1.75). Management options including conservative management, lytic administration and thrombectomy discussed. Given high risk features of PE, decision for transfer to [facility] for IR thrombectomy. PESI: 115, Class IV, High Risk (4-11.4% 30-day mortality) Bova Score: 5, Stage III, High Risk (42% risk of PE-related complications at 30 days, 10% risk of PE-related mortality)  - Transfer to [facility] for IR intervention - Plan for IR thrombectomy and CT Abd/Pelvis venogram -  Therapeutic anticoagulation with heparin gtt, appreciate pharmacy assistance - F/u Echo - F/u LE Dopplers - Eventual transition to PO anticoagulation once nearing discharge  Best Practice: (right  click and "Reselect all SmartList Selections" daily)   Diet/type: NPO DVT prophylaxis: systemic heparin GI prophylaxis: PPI Lines: N/A Foley:  N/A Code Status:  full code Last date of multidisciplinary goals of care discussion [Pending]  Labs:  CBC: Recent Labs  Lab 05/11/21 1052 05/11/21 1102  WBC 17.4*  --   NEUTROABS 14.9*  --   HGB 14.2 15.3  HCT 44.4 45.0  MCV 90.1  --   PLT 157  --    Basic Metabolic Panel: Recent Labs  Lab 05/11/21 1052 05/11/21 1102  NA 136 138  K 3.8 4.2  CL 101 101  CO2 24  --   GLUCOSE 148* 149*  BUN 20 22  CREATININE 1.14 1.20  CALCIUM 9.3  --    GFR: Estimated Creatinine Clearance: 63.4 mL/min (by C-G formula based on SCr of 1.2 mg/dL). Recent Labs  Lab 05/11/21 1052  WBC 17.4*   Liver Function Tests: Recent Labs  Lab 05/11/21 1052  AST 34  ALT 74*  ALKPHOS 88  BILITOT 0.4  PROT 7.4  ALBUMIN 3.5   No results for input(s): LIPASE, AMYLASE in the last 168 hours. No results for input(s): AMMONIA in the last 168 hours.  ABG:    Component Value Date/Time   HCO3 25.1 03/27/2021 2311   TCO2 27 05/11/2021 1102   O2SAT 92.7 03/27/2021 2311    Coagulation Profile: No results for input(s): INR, PROTIME in the last 168 hours.  Cardiac Enzymes: No results for input(s): CKTOTAL, CKMB, CKMBINDEX, TROPONINI in the last 168 hours.  HbA1C: No results found for: HGBA1C  CBG: No results for input(s): GLUCAP in the last 168 hours.  Review of Systems:   Review of systems completed with pertinent positives/negatives outlined in above HPI.  Past Medical History:  He,  has a past medical history of Arthritis, Cough, and GERD (gastroesophageal reflux disease).   Surgical History:   Past Surgical History:  Procedure Laterality Date   CHOLECYSTECTOMY N/A 03/30/2021   Procedure: LAPAROSCOPIC CHOLECYSTECTOMY WITH INTRAOPERATIVE CHOLANGIOGRAM;  Surgeon: Armandina Gemma, MD;  Location: [facility] ORS;  Service: General;  Laterality: N/A;    COLONOSCOPY  06/17/2011   Procedure: COLONOSCOPY;  Surgeon: [name], MD;  Location: [facility] ENDOSCOPY;  Service: Endoscopy;  Laterality: N/A;   KNEE SURGERY Right YRS AGO   MENISCUS   THUMB ARTHROSCOPY Right YRS AGO   TOTAL HIP ARTHROPLASTY Left 06/25/2017   Procedure: LEFT TOTAL HIP ARTHROPLASTY ANTERIOR APPROACH;  Surgeon: Gaynelle Arabian, MD;  Location: [facility] ORS;  Service: Orthopedics;  Laterality: Left;    Social History:   reports that he has never smoked. He has never used smokeless tobacco. He reports that he does not drink alcohol and does not use drugs.   Family History:  His family history includes CAD in his brother; Hypertension in an other family member. There is no history of Diabetes.   Allergies: No Known Allergies   Home Medications: Prior to Admission medications   Medication Sig Start Date End Date Taking? Authorizing Provider  acetaminophen (TYLENOL) 500 MG tablet Take 500 mg by mouth every 6 (six) hours as needed (for pain).    [provider]  amoxicillin-clavulanate (AUGMENTIN) 875-125 MG tablet Take 1 tablet by mouth every 12 hours 03/31/21   [name], MD  atorvastatin (LIPITOR) 10 MG tablet Take  1 tablet (10 mg total) by mouth daily. 01/11/21     Cholecalciferol (VITAMIN D3) 50 MCG (2000 UT) TABS Take 8,000 Units by mouth daily.    [provider]  Coenzyme Q10 (CO Q-10 PO) Take 1 capsule by mouth daily.    [provider]  ibuprofen (ADVIL) 200 MG tablet Take 600 mg by mouth every 6 (six) hours as needed (for pain).    [provider]  Menaquinone-7 (VITAMIN K2 PO) Take 1 tablet by mouth daily.    [provider]  pantoprazole (PROTONIX) 40 MG tablet Take 1 tablet (40 mg total) by mouth daily. 03/31/21   [name], MD  rivaroxaban (XARELTO) 10 MG TABS tablet Take 1 tablet (10 mg total) by mouth daily with breakfast. Patient not taking: Reported on 03/27/2021 06/26/17   [place], [employer], PA-C  vitamin C  (ASCORBIC ACID) 500 MG tablet Take 500-1,000 mg by mouth daily.    [provider]    Critical care time: 71 minutes   Lestine Mount, PA-C [facility] 05/11/21 1:36 PM  Please see [email or web address] for pager details.  From 7A-7P if no response, please call [phone] After hours, please call ELink [phone]

## 2021-05-11 NOTE — Sedation Documentation (Signed)
Patient is resting comfortably. 

## 2021-05-11 NOTE — Sedation Documentation (Signed)
Vital signs stable. Procedure started 

## 2021-05-11 NOTE — Progress Notes (Signed)
D/w Care loink =they have truick available for IR and d/w Dr Jacky Kindle of CCM at [place] patient wil remain at [facility] post procedure for safety observation and continued Rx ? ? ? ? ?SIGNATURE  ? ? ?Dr. Brand Males, M.D., F.C.C.P,  ?Pulmonary and Critical Care Medicine ?Staff Physician, [place] ?Center Director - Interstitial Lung Disease  Program  ?[facility] [place] [facility] ?[place], [place], [address] ? ?NPI Number:  NPI #[id number] ?DEA Number: [id number] ? ?Pager: [phone], If no answer  -> Check AMION or Try [phone] ?Telephone (clinical office): [phone] ?Telephone (research): [phone] ? ?3:39 PM ?05/11/2021 ? ?

## 2021-05-11 NOTE — H&P (Addendum)
ATTESTATION & SIGNATURE   STAFF NOTE: I, Dr Ann Lions have personally reviewed patient's available data, including medical history, events of note, physical examination and test results as part of my evaluation. I have discussed with resident/NP and other care providers such as pharmacist, RN and RRT.  In addition,  I personally evaluated patient and elicited key findings of   S: Date of admit 05/11/2021 with LOS 0 for today 05/11/2021 : Juan Snow is  -new consult from the emergency department Dr. [name].  His spouse is Juan Snow who works at [employer] and said the [place].  He was originally admitted 03/27/2021 - 03/31/2021 for 4 days with abdominal pain fever and chills sepsis requiring antibiotic therapy.  He did have some mild acute kidney injury with this admission.  He did grow E. coli bacteremia.  Diagnosis was cholecystitis from gallstones which was confirmed on MRCP.  He was status postcholecystectomy via laparoscopy on 03/30/2021.  Course was also complicated by prolonged QTc on the EKG that improved.  His discharge medication did include rivaroxaban 10 mg/day [I did not elicit this history from the wife of the patient but subsequently found out on chart review]  According to him and his wife at the time of discharge from the hospital he was having some shortness of breath.  This is progressively gotten worsen.  He did go to [place] for 5 days for his retirement party with his wife.  He flew via [place] for 3 hours.  He took a road trip from [place].  During the time he was in [place] he was quite active.  He returned approximately 05/06/2301/1623.  During this time his left lower extremity calf continued to swell more.  Shortness of breath is also progressively getting worse.  Then on 05/11/2021 date of admission he became acutely short of breath.  Brought into the ER.  In the ER he was hypoxemic requiring 2-3 L of nasal cannula.  He was tachycardic.  Is  also mildly tachypneic with shallow rapid respiration.  But able to talk full sentences no encephalopathy.  Blood pressure intact.  No temperature no confusion.  His troponin was elevated at 562.  Duplex lower extremity shows extensive DVT but there is no compartment syndrome in the lower leg.  His CT angiogram shows RV LV strain of greater than 1.7.  Saddle PE present.  Personally visualized the CT scan.  His pulmonary embolism severity index is 4 out of 5 And his Bova score -is class III highest risk which is 42% risk of decompensation  Both score suggest a 5-10% risk of 30-day mortality    Latest Reference Range & Units 03/27/21 23:11  D-Dimer, Quant 0.00 - 0.50 ug/mL-FEU 1.05 (H)  (H): Data is abnormally high  Latest Reference Range & Units 03/27/21 23:11 03/28/21 00:15 05/11/21 10:52  Troponin I (High lead which is 1-1/2 hours.  He returned approximately on 05/06/2021 at 05/07/2021.  During the time sensitivity) <18 ng/L 16 18 (H) 562 (HH)  (HH): Data is critically high (H): Data is abnormally high O:  Blood pressure (!) 138/102, pulse (!) 110, resp. rate (!) 32, height 5' 7.5" (1.715 m), weight 81.6 kg, SpO2 100 %.   Sitting in the bed in the emergency room stretcher room 18 at [place] and oriented x3 clear to auscultation Left lower extremity calf definitely 2 times more swollen than the right extremity.  It is warm but not tender.  There is some  edema Abdomen soft no bleeding Extremities intact Skin intact Joints intact.   LABS    Latest Reference Range & Units 03/27/21 23:11  Procalcitonin ng/mL 34.45     Latest Reference Range & Units 03/27/21 16:58 03/27/21 20:27 03/27/21 23:11 03/28/21 00:15  Lactic Acid, Venous 0.5 - 1.9 mmol/L 2.2 (HH) 1.6 1.6 1.5  (HH): Data is critically high   Latest Reference Range & Units 03/27/21 23:11 03/28/21 00:15 05/11/21 10:52  B Natriuretic Peptide 0.0 - 100.0 pg/mL   23.1  CK Total 49 - 397 U/L 93    Troponin I (High  Sensitivity) <18 ng/L 16 18 (H) 562 (HH)  (HH): Data is critically high (H): Data is abnormally high PULMONARY Recent Labs  Lab 05/11/21 1102  TCO2 27    CBC Recent Labs  Lab 05/11/21 1052 05/11/21 1102  HGB 14.2 15.3  HCT 44.4 45.0  WBC 17.4*  --   PLT 157  --     COAGULATION No results for input(s): INR in the last 168 hours.  CARDIAC  No results for input(s): TROPONINI in the last 168 hours. No results for input(s): PROBNP in the last 168 hours.   CHEMISTRY Recent Labs  Lab 05/11/21 1052 05/11/21 1102  NA 136 138  K 3.8 4.2  CL 101 101  CO2 24  --   GLUCOSE 148* 149*  BUN 20 22  CREATININE 1.14 1.20  CALCIUM 9.3  --    Estimated Creatinine Clearance: 63.4 mL/min (by C-G formula based on SCr of 1.2 mg/dL).   LIVER Recent Labs  Lab 05/11/21 1052  AST 34  ALT 74*  ALKPHOS 88  BILITOT 0.4  PROT 7.4  ALBUMIN 3.5     INFECTIOUS No results for input(s): LATICACIDVEN, PROCALCITON in the last 168 hours.   ENDOCRINE CBG (last 3)  No results for input(s): GLUCAP in the last 72 hours.       IMAGING x24h  - image(s) personally visualized  -   highlighted in bold CT Angio Chest PE W and/or Wo Contrast  Result Date: 05/11/2021 CLINICAL DATA:  Shortness of breath and left leg swelling. Gallbladder surgery 5 weeks ago. EXAM: CT ANGIOGRAPHY CHEST WITH CONTRAST TECHNIQUE: Multidetector CT imaging of the chest was performed using the standard protocol during bolus administration of intravenous contrast. Multiplanar CT image reconstructions and MIPs were obtained to evaluate the vascular anatomy. RADIATION DOSE REDUCTION: This exam was performed according to the departmental dose-optimization program which includes automated exposure control, adjustment of the mA and/or kV according to patient size and/or use of iterative reconstruction technique. CONTRAST:  67m OMNIPAQUE IOHEXOL 350 MG/ML SOLN COMPARISON:  None. FINDINGS: Cardiovascular: The heart is normal  in size. No pericardial effusion. The aorta is normal in caliber. No dissection. Minimal scattered atherosclerotic calcifications. The pulmonary arterial tree is fairly well opacified. There is submassive bilateral pulmonary emboli. Associated significant right heart strain with RV LV ratio of much greater than 1. The left ventricle is flattened and there is some contrast coursing down the IVC and into the hepatic veins. Mediastinum/Nodes: No mediastinal or hilar mass or adenopathy. The esophagus is grossly normal. There is a moderate to large hiatal hernia noted. Lungs/Pleura: Small left pleural effusion and overlying atelectasis. Peripheral rounded density in the left lower lobe could be atelectasis or pulmonary infarct. Similar findings the peripheral aspect of the right upper lobe. Right apical cavitary process measuring a maximum of 5 cm. This could represent chronic post pneumonic scarring changes but could not  exclude neoplasm. Recommend pulmonary consultation and possible PET scan. Upper Abdomen: No significant upper abdominal findings. The gallbladder is surgically absent. Musculoskeletal: No chest wall mass, supraclavicular axillary adenopathy. The bony thorax is intact. Review of the MIP images confirms the above findings. IMPRESSION: 1. Positive for acute PE with CT evidence of right heart strain (RV/LV Ratio = 1.75) consistent with at least submassive (intermediate risk) PE. The presence of right heart strain has been associated with an increased risk of morbidity and mortality. Please refer to the "PE Focused" order set in EPIC. 2. Small left pleural effusion and overlying atelectasis. 3. Peripheral rounded density in the left lower lobe and right upper lobe could be atelectasis or pulmonary infarct. 4. 5 cm cavitary process in the right lung apex. This could represent chronic post pneumonic scarring changes but could not exclude neoplasm. Recommend pulmonary consultation and possible PET scan. 5.  Moderate to large hiatal hernia. 6. Aortic atherosclerosis. These results were called by telephone at the time of interpretation on 05/11/2021 at 12:40 pm to provider DAN FLOYD , who verbally acknowledged these results. Aortic Atherosclerosis (ICD10-I70.0). Electronically Signed   By: Marijo Sanes M.D.   On: 05/11/2021 12:40   VAS [place] IVC/ILIAC (VENOUS ONLY)  Result Date: 05/11/2021 IVC/ILIAC STUDY Patient Name:  Juan Snow  Date of Exam:   05/11/2021 Medical Rec #: [id number]        Accession #:    [id number] Date of Birth: 24-Aug-1955         Patient Gender: M Patient Age:   66 years Exam Location:  [facility] Procedure:      VAS US AORTA/IVC/ILIACS Referring Phys: --------------------------------------------------------------------------------  Limitations: Air/bowel gas and obesity.  Comparison Study: No previous exam Performing Technologist: Jody Hill RVT, RDMS  Examination Guidelines: A complete evaluation includes B-mode imaging, spectral Doppler, color Doppler, and power Doppler as needed of all accessible portions of each vessel. Bilateral testing is considered an integral part of a complete examination. Limited examinations for reoccurring indications may be performed as noted.  IVC/Iliac Findings: +----------+------+--------+--------------+     IVC     Patent Thrombus    Comments     +----------+------+--------+--------------+  IVC Prox                   not visualized  +----------+------+--------+--------------+  IVC Mid                    not visualized  +----------+------+--------+--------------+  IVC Distal patent                          +----------+------+--------+--------------+  +----------------+---------+-----------+---------+-----------+----------------+        CIV        RT-Patent RT-Thrombus LT-Patent LT-Thrombus     Comments      +----------------+---------+-----------+---------+-----------+----------------+  Common Iliac      patent                            acute                        Prox                                                                           +----------------+---------+-----------+---------+-----------+----------------+  Common Iliac Mid  patent                                     not visualized L  +----------------+---------+-----------+---------+-----------+----------------+  Common Iliac                                                 not visualized R   Distal                                                             & L         +----------------+---------+-----------+---------+-----------+----------------+  +-------------------------+---------+-----------+---------+-----------+--------+             EIV            RT-Patent RT-Thrombus LT-Patent LT-Thrombus Comments  +-------------------------+---------+-----------+---------+-----------+--------+  External Iliac Vein Prox                                     acute              +-------------------------+---------+-----------+---------+-----------+--------+  External Iliac Vein Mid                                      acute              +-------------------------+---------+-----------+---------+-----------+--------+  External Iliac Vein                                          acute               Distal                                                                          +-------------------------+---------+-----------+---------+-----------+--------+   Summary: IVC/Iliac: There is no evidence of thrombus involving the distal portion of the IVC. There is no evidence of thrombus involving the right common iliac vein. There is evidence of acute thrombus involving the left common iliac vein. There is no evidence of  thrombus involving the right external iliac vein. There is evidence of acute thrombus involving the left external iliac vein. Visualization of proximal Inferior Vena Cava, mid inferior vena cava, distal common Iliacs and left mid common Iliac was limited.  *See table(s) above for measurements and  observations.   Preliminary    DG Chest Port 1 View  Result Date: 05/11/2021 CLINICAL DATA:  cough, hypoxia EXAM: PORTABLE CHEST 1 VIEW COMPARISON:  03/27/2021. FINDINGS: New left lateral basilar opacities, suspicious for small pleural effusion with overlying consolidation. Right lung is clear. No visible pneumothorax. IMPRESSION:  New left lateral basilar opacities, suspicious for small pleural effusion with overlying pneumonia. Followup PA and lateral chest X-ray is recommended in 3-4 weeks following trial of antibiotic therapy to ensure resolution and exclude underlying malignancy. Electronically Signed   By: Margaretha Sheffield M.D.   On: 05/11/2021 11:13   VAS [place] LOWER EXTREMITY VENOUS (DVT)  Result Date: 05/11/2021 IVC/ILIAC STUDY Patient Name:  Juan Snow  Date of Exam:   05/11/2021 Medical Rec #: [id number]        Accession #:    [id number] Date of Birth: 09/06/1955         Patient Gender: M Patient Age:   22 years Exam Location:  [facility] Procedure:      VAS US AORTA/IVC/ILIACS Referring Phys: --------------------------------------------------------------------------------  Limitations: Air/bowel gas and obesity.  Comparison Study: No previous exam Performing Technologist: Jody Hill RVT, RDMS  Examination Guidelines: A complete evaluation includes B-mode imaging, spectral Doppler, color Doppler, and power Doppler as needed of all accessible portions of each vessel. Bilateral testing is considered an integral part of a complete examination. Limited examinations for reoccurring indications may be performed as noted.  IVC/Iliac Findings: +----------+------+--------+--------------+     IVC     Patent Thrombus    Comments     +----------+------+--------+--------------+  IVC Prox                   not visualized  +----------+------+--------+--------------+  IVC Mid                    not visualized  +----------+------+--------+--------------+  IVC Distal patent                           +----------+------+--------+--------------+  +----------------+---------+-----------+---------+-----------+----------------+        CIV        RT-Patent RT-Thrombus LT-Patent LT-Thrombus     Comments      +----------------+---------+-----------+---------+-----------+----------------+  Common Iliac      patent                            acute                       Prox                                                                           +----------------+---------+-----------+---------+-----------+----------------+  Common Iliac Mid  patent                                     not visualized L  +----------------+---------+-----------+---------+-----------+----------------+  Common Iliac                                                 not visualized R   Distal                                                             &  L         +----------------+---------+-----------+---------+-----------+----------------+  +-------------------------+---------+-----------+---------+-----------+--------+             EIV            RT-Patent RT-Thrombus LT-Patent LT-Thrombus Comments  +-------------------------+---------+-----------+---------+-----------+--------+  External Iliac Vein Prox                                     acute              +-------------------------+---------+-----------+---------+-----------+--------+  External Iliac Vein Mid                                      acute              +-------------------------+---------+-----------+---------+-----------+--------+  External Iliac Vein                                          acute               Distal                                                                          +-------------------------+---------+-----------+---------+-----------+--------+   Summary: IVC/Iliac: There is no evidence of thrombus involving the distal portion of the IVC. There is no evidence of thrombus involving the right common iliac vein. There is evidence of acute thrombus involving  the left common iliac vein. There is no evidence of  thrombus involving the right external iliac vein. There is evidence of acute thrombus involving the left external iliac vein. Visualization of proximal Inferior Vena Cava, mid inferior vena cava, distal common Iliacs and left mid common Iliac was limited.  *See table(s) above for measurements and observations.   Preliminary       A: Acute hypoxemic respiratory failure secondary to submassive pulmonary embolism with acute cor pulmonale and RV strain  -Risk factors include recent surgery, reduced mobility and also recent travel -No smoking or lung disease or heart disease or cancer or testosterone intake is risk factors -No family history of PE  P: IV heparin with rescue therapy of systemic anticoagulation Interventional radiology consulted by emergency department Dr. Tyrone Nine at our request -he spoke to Dr. Kathlene Cote who is going to evaluate  -IR consult is for thrombectomy versus local cath directed thrombolysis - d/w Dr Kathlene Cote    PCCM recommends a favors an interventional radiology approach given his high PEsi score and Bova score -await IR consult  Updat 2:17 PM  =- call from Vici of IR at [facility] -d/w IR at [facility]-> advised thrombectomy at [facility]. Order being changed to admit to [facility] ICU - PA will inform ccm services at [facility] and care link  Anti-infectives (From admission, onward)    None        Rest per NP/medical resident whose note is outlined above and that I agree with  The patient is critically ill with multiple organ systems failure  and requires high complexity decision making for assessment and support, frequent evaluation and titration of therapies, application of advanced monitoring technologies and extensive interpretation of multiple databases.   Critical Care Time devoted to patient care services described in this note is  45  Minutes. This time reflects time of care of this signee Dr  Brand Males. This critical care time does not reflect procedure time, or teaching time or supervisory time of PA/NP/Med student/Med Resident etc but could involve care discussion time     Dr. Brand Males, M.D., [facility].C.P Pulmonary and Critical Care Medicine Staff Physician [facility] Pager: [phone], If no answer or between  15:00h - 7:00h: call [phone]  05/11/2021 1:15 PM

## 2021-05-11 NOTE — Sedation Documentation (Signed)
Report given via telephone and at bedside to [facility], [place]. Groin level 0 at handoff. Pt denies pain, vss. ? ?Totals: ?126mg fentanyl ?3.5 mg versed ?3000 units heparin ?Time : 1h493ms ?

## 2021-05-11 NOTE — ED Notes (Signed)
Pt transported to CT ?

## 2021-05-11 NOTE — Sedation Documentation (Signed)
Pt arrived to [facility] IR via carelink. Received report. Pt vitals stable at this time. Pt reports no pain , only complains of shortness of breath. MD at beside to speak with pt. ?

## 2021-05-11 NOTE — Sedation Documentation (Signed)
Patient is resting

## 2021-05-11 NOTE — Progress Notes (Signed)
LLE venous duplex and bilateral IVC iliac studies have been completed.  Preliminary findings given to Dr. Tyrone Nine. ? ?Results can be found under chart review under CV PROC. ?05/11/2021 12:23 PM ?Damira Kem RVT, RDMS ? ?

## 2021-05-11 NOTE — ED Notes (Signed)
Vascular at bedside

## 2021-05-11 NOTE — ED Notes (Signed)
Dr. Tyrone Nine aware critical trop 562. ?

## 2021-05-11 NOTE — ED Provider Notes (Signed)
[place] DEPT Provider Note   CSN: [id number] Arrival date & time: 05/11/21  1020     History  Chief Complaint  Patient presents with   Shortness of Breath    Juan Snow is a 66 y.o. male.  66 yo M with a chief complaints of shortness of breath and cough.  The patient had a recent cholecystectomy and since then has been doing very well with the exception of some gas now and again but denies abdominal pain denies fever.  Unfortunately he had developed a persistent cough and some shortness of breath.  The cough is actually resolved over the past couple days.  He has noticed that his left leg is swollen but not painful.  Denies any chest pain or pressure.  He recently went to [place] and had no issues there but did state that he took it easy.   Shortness of Breath     Home Medications Prior to Admission medications   Medication Sig Start Date End Date Taking? Authorizing Provider  acetaminophen (TYLENOL) 500 MG tablet Take 500 mg by mouth every 6 (six) hours as needed (for pain).   Yes [provider]  aspirin EC 81 MG tablet Take 162 mg by mouth daily as needed.   Yes [provider]  atorvastatin (LIPITOR) 10 MG tablet Take 1 tablet (10 mg total) by mouth daily. 01/11/21  Yes   Cholecalciferol (VITAMIN D3) 50 MCG (2000 UT) TABS Take 6,000 Units by mouth daily.   Yes [provider]  Coenzyme Q10 (CO Q-10 PO) Take 1 capsule by mouth daily.   Yes [provider]  ibuprofen (ADVIL) 200 MG tablet Take 600 mg by mouth every 6 (six) hours as needed (for pain).   Yes [provider]  Menaquinone-7 (VITAMIN K2 PO) Take 1 tablet by mouth daily.   Yes [provider]  Omega-3 Fatty Acids (FISH OIL) 1000 MG CAPS Take 1,000 capsules by mouth daily.   Yes [provider]  pantoprazole (PROTONIX) 40 MG tablet Take 1 tablet (40 mg total) by mouth daily. 03/31/21  Yes [name], MD   rivaroxaban (XARELTO) 10 MG TABS tablet Take 1 tablet (10 mg total) by mouth daily with breakfast. Patient not taking: Reported on 03/27/2021 06/26/17   [place], [employer], PA-C  vitamin C (ASCORBIC ACID) 500 MG tablet Take 500-1,000 mg by mouth daily. Patient not taking: Reported on 05/11/2021    [provider]      Allergies    Patient has no known allergies.    Review of Systems   Review of Systems  Respiratory:  Positive for shortness of breath.    Physical Exam Updated Vital Signs BP (!) 118/93    Pulse (!) 106    Temp 97.9 F (36.6 C) (Oral)    Resp 19    Ht 5' 7.5" (1.715 m)    Wt 81.6 kg    SpO2 97%    BMI 27.78 kg/m  Physical Exam Vitals and nursing note reviewed.  Constitutional:      Appearance: He is well-developed.  HENT:     Head: Normocephalic and atraumatic.  Eyes:     Pupils: Pupils are equal, round, and reactive to light.  Neck:     Vascular: No JVD.  Cardiovascular:     Rate and Rhythm: Regular rhythm. Tachycardia present.     Heart sounds: No murmur heard.   No friction rub. No gallop.  Pulmonary:  Effort: Tachypnea present. No respiratory distress.     Breath sounds: No wheezing.  Abdominal:     General: There is no distension.     Tenderness: There is no abdominal tenderness. There is no guarding or rebound.  Musculoskeletal:        General: Normal range of motion.     Cervical back: Normal range of motion and neck supple.     Left lower leg: Edema present.     Comments: Edema up to the thigh.  PMS intact distally  Skin:    Coloration: Skin is not pale.     Findings: No rash.  Neurological:     Mental Status: He is alert and oriented to person, place, and time.  Psychiatric:        Behavior: Behavior normal.    ED Results / Procedures / Treatments   Labs (all labs ordered are listed, but only abnormal results are displayed) Labs Reviewed  CBC WITH DIFFERENTIAL/PLATELET - Abnormal; Notable for the following components:       Result Value   WBC 17.4 (*)    Neutro Abs 14.9 (*)    Abs Immature Granulocytes 0.66 (*)    All other components within normal limits  COMPREHENSIVE METABOLIC PANEL - Abnormal; Notable for the following components:   Glucose, Bld 148 (*)    ALT 74 (*)    All other components within normal limits  I-STAT CHEM 8, ED - Abnormal; Notable for the following components:   Glucose, Bld 149 (*)    All other components within normal limits  TROPONIN I (HIGH SENSITIVITY) - Abnormal; Notable for the following components:   Troponin I (High Sensitivity) 562 (*)    All other components within normal limits  TROPONIN I (HIGH SENSITIVITY) - Abnormal; Notable for the following components:   Troponin I (High Sensitivity) 838 (*)    All other components within normal limits  RESP PANEL BY RT-PCR (FLU A&B, COVID) ARPGX2  MRSA NEXT GEN BY PCR, NASAL  BRAIN NATRIURETIC PEPTIDE  HEPARIN LEVEL (UNFRACTIONATED)  I-STAT CHEM 8, ED    EKG None  Radiology CT Angio Chest PE W and/or Wo Contrast  Result Date: 05/11/2021 CLINICAL DATA:  Shortness of breath and left leg swelling. Gallbladder surgery 5 weeks ago. EXAM: CT ANGIOGRAPHY CHEST WITH CONTRAST TECHNIQUE: Multidetector CT imaging of the chest was performed using the standard protocol during bolus administration of intravenous contrast. Multiplanar CT image reconstructions and MIPs were obtained to evaluate the vascular anatomy. RADIATION DOSE REDUCTION: This exam was performed according to the departmental dose-optimization program which includes automated exposure control, adjustment of the mA and/or kV according to patient size and/or use of iterative reconstruction technique. CONTRAST:  35m OMNIPAQUE IOHEXOL 350 MG/ML SOLN COMPARISON:  None. FINDINGS: Cardiovascular: The heart is normal in size. No pericardial effusion. The aorta is normal in caliber. No dissection. Minimal scattered atherosclerotic calcifications. The pulmonary arterial tree is fairly  well opacified. There is submassive bilateral pulmonary emboli. Associated significant right heart strain with RV LV ratio of much greater than 1. The left ventricle is flattened and there is some contrast coursing down the IVC and into the hepatic veins. Mediastinum/Nodes: No mediastinal or hilar mass or adenopathy. The esophagus is grossly normal. There is a moderate to large hiatal hernia noted. Lungs/Pleura: Small left pleural effusion and overlying atelectasis. Peripheral rounded density in the left lower lobe could be atelectasis or pulmonary infarct. Similar findings the peripheral aspect of the right upper lobe. Right  apical cavitary process measuring a maximum of 5 cm. This could represent chronic post pneumonic scarring changes but could not exclude neoplasm. Recommend pulmonary consultation and possible PET scan. Upper Abdomen: No significant upper abdominal findings. The gallbladder is surgically absent. Musculoskeletal: No chest wall mass, supraclavicular axillary adenopathy. The bony thorax is intact. Review of the MIP images confirms the above findings. IMPRESSION: 1. Positive for acute PE with CT evidence of right heart strain (RV/LV Ratio = 1.75) consistent with at least submassive (intermediate risk) PE. The presence of right heart strain has been associated with an increased risk of morbidity and mortality. Please refer to the "PE Focused" order set in EPIC. 2. Small left pleural effusion and overlying atelectasis. 3. Peripheral rounded density in the left lower lobe and right upper lobe could be atelectasis or pulmonary infarct. 4. 5 cm cavitary process in the right lung apex. This could represent chronic post pneumonic scarring changes but could not exclude neoplasm. Recommend pulmonary consultation and possible PET scan. 5. Moderate to large hiatal hernia. 6. Aortic atherosclerosis. These results were called by telephone at the time of interpretation on 05/11/2021 at 12:40 pm to provider Khasir Woodrome , who verbally acknowledged these results. Aortic Atherosclerosis (ICD10-I70.0). Electronically Signed   By: Marijo Sanes M.D.   On: 05/11/2021 12:40   VAS [place] IVC/ILIAC (VENOUS ONLY)  Result Date: 05/11/2021 IVC/ILIAC STUDY Patient Name:  Juan Snow  Date of Exam:   05/11/2021 Medical Rec #: [id number]        Accession #:    [id number] Date of Birth: 1955/08/26         Patient Gender: M Patient Age:   39 years Exam Location:  [facility] Procedure:      VAS [place] IVC/ILIAC (VENOUS ONLY) Referring Phys: --------------------------------------------------------------------------------  Limitations: Air/bowel gas and obesity.  Comparison Study: No previous exam Performing Technologist: Jody Hill RVT, RDMS  Examination Guidelines: A complete evaluation includes B-mode imaging, spectral Doppler, color Doppler, and power Doppler as needed of all accessible portions of each vessel. Bilateral testing is considered an integral part of a complete examination. Limited examinations for reoccurring indications may be performed as noted.  IVC/Iliac Findings: +----------+------+--------+--------------+     IVC     Patent Thrombus    Comments     +----------+------+--------+--------------+  IVC Prox                   not visualized  +----------+------+--------+--------------+  IVC Mid                    not visualized  +----------+------+--------+--------------+  IVC Distal patent                          +----------+------+--------+--------------+  +----------------+---------+-----------+---------+-----------+----------------+        CIV        RT-Patent RT-Thrombus LT-Patent LT-Thrombus     Comments      +----------------+---------+-----------+---------+-----------+----------------+  Common Iliac      patent                            acute                       Prox                                                                            +----------------+---------+-----------+---------+-----------+----------------+  Common Iliac Mid  patent                                     not visualized L  +----------------+---------+-----------+---------+-----------+----------------+  Common Iliac                                                 not visualized R   Distal                                                             & L         +----------------+---------+-----------+---------+-----------+----------------+  +-------------------------+---------+-----------+---------+-----------+--------+             EIV            RT-Patent RT-Thrombus LT-Patent LT-Thrombus Comments  +-------------------------+---------+-----------+---------+-----------+--------+  External Iliac Vein Prox                                     acute              +-------------------------+---------+-----------+---------+-----------+--------+  External Iliac Vein Mid                                      acute              +-------------------------+---------+-----------+---------+-----------+--------+  External Iliac Vein                                          acute               Distal                                                                          +-------------------------+---------+-----------+---------+-----------+--------+   Summary: IVC/Iliac: There is no evidence of thrombus involving the distal portion of the IVC. There is no evidence of thrombus involving the right common iliac vein. There is evidence of acute thrombus involving the left common iliac vein. There is no evidence of  thrombus involving the right external iliac vein. There is evidence of acute thrombus involving the left external iliac vein. Visualization of proximal Inferior Vena Cava, mid inferior vena cava, distal common Iliacs and left mid common Iliac was limited.  *See table(s) above for measurements and observations.   Preliminary    DG Chest Port 1 View  Result Date: 05/11/2021 CLINICAL  DATA:  cough, hypoxia EXAM: PORTABLE CHEST 1 VIEW COMPARISON:  03/27/2021. FINDINGS: New left lateral basilar opacities, suspicious for small pleural effusion with overlying consolidation. Right lung is clear. No visible pneumothorax. IMPRESSION:  New left lateral basilar opacities, suspicious for small pleural effusion with overlying pneumonia. Followup PA and lateral chest X-ray is recommended in 3-4 weeks following trial of antibiotic therapy to ensure resolution and exclude underlying malignancy. Electronically Signed   By: Margaretha Sheffield M.D.   On: 05/11/2021 11:13   VAS [place] LOWER EXTREMITY VENOUS (DVT)  Result Date: 05/11/2021 IVC/ILIAC STUDY Patient Name:  Juan Snow  Date of Exam:   05/11/2021 Medical Rec #: [id number]        Accession #:    [id number] Date of Birth: 10-Mar-1955         Patient Gender: M Patient Age:   70 years Exam Location:  [facility] Procedure:      VAS US AORTA/IVC/ILIACS Referring Phys: --------------------------------------------------------------------------------  Limitations: Air/bowel gas and obesity.  Comparison Study: No previous exam Performing Technologist: Jody Hill RVT, RDMS  Examination Guidelines: A complete evaluation includes B-mode imaging, spectral Doppler, color Doppler, and power Doppler as needed of all accessible portions of each vessel. Bilateral testing is considered an integral part of a complete examination. Limited examinations for reoccurring indications may be performed as noted.  IVC/Iliac Findings: +----------+------+--------+--------------+     IVC     Patent Thrombus    Comments     +----------+------+--------+--------------+  IVC Prox                   not visualized  +----------+------+--------+--------------+  IVC Mid                    not visualized  +----------+------+--------+--------------+  IVC Distal patent                          +----------+------+--------+--------------+   +----------------+---------+-----------+---------+-----------+----------------+        CIV        RT-Patent RT-Thrombus LT-Patent LT-Thrombus     Comments      +----------------+---------+-----------+---------+-----------+----------------+  Common Iliac      patent                            acute                       Prox                                                                           +----------------+---------+-----------+---------+-----------+----------------+  Common Iliac Mid  patent                                     not visualized L  +----------------+---------+-----------+---------+-----------+----------------+  Common Iliac                                                 not visualized R   Distal                                                             &  L         +----------------+---------+-----------+---------+-----------+----------------+  +-------------------------+---------+-----------+---------+-----------+--------+             EIV            RT-Patent RT-Thrombus LT-Patent LT-Thrombus Comments  +-------------------------+---------+-----------+---------+-----------+--------+  External Iliac Vein Prox                                     acute              +-------------------------+---------+-----------+---------+-----------+--------+  External Iliac Vein Mid                                      acute              +-------------------------+---------+-----------+---------+-----------+--------+  External Iliac Vein                                          acute               Distal                                                                          +-------------------------+---------+-----------+---------+-----------+--------+   Summary: IVC/Iliac: There is no evidence of thrombus involving the distal portion of the IVC. There is no evidence of thrombus involving the right common iliac vein. There is evidence of acute thrombus involving the left common iliac vein. There is no  evidence of  thrombus involving the right external iliac vein. There is evidence of acute thrombus involving the left external iliac vein. Visualization of proximal Inferior Vena Cava, mid inferior vena cava, distal common Iliacs and left mid common Iliac was limited.  *See table(s) above for measurements and observations.   Preliminary     Procedures Procedures    Medications Ordered in ED Medications  sodium chloride (PF) 0.9 % injection (  Not Given 05/11/21 1448)  heparin ADULT infusion 100 units/mL (25000 units/285m) (1,350 Units/hr Intravenous New Bag/Given 05/11/21 1237)  docusate sodium (COLACE) capsule 100 mg (has no administration in time range)  polyethylene glycol (MIRALAX / GLYCOLAX) packet 17 g (has no administration in time range)  dextrose 5 %-0.45 % sodium chloride infusion (has no administration in time range)  pantoprazole (PROTONIX) injection 40 mg (has no administration in time range)  Chlorhexidine Gluconate Cloth 2 % PADS 6 each (6 each Topical Given 05/11/21 1430)  iohexol (OMNIPAQUE) 350 MG/ML injection 80 mL (80 mLs Intravenous Contrast Given 05/11/21 1210)  heparin bolus via infusion 2,500 Units (2,500 Units Intravenous Bolus from Bag 05/11/21 1238)    ED Course/ Medical Decision Making/ A&P                           Medical Decision Making Amount and/or Complexity of Data Reviewed Labs: ordered. Radiology: ordered.  Risk Prescription drug management. Decision regarding hospitalization.   66yo M with a chief complaints of shortness of breath since  he had had a cholecystectomy done about 5 weeks ago.  Patient's hypoxic not on oxygen at home oxygen saturation in the mid 80s.  Tachycardic here as well with a swollen left lower extremity recently postop most likely diagnosis of a pulmonary embolism.  We will obtain a laboratory evaluation chest x-ray likely CT angiogram of the chest to evaluate for PE.  DVT study of the left lower extremity.  Reassess.  Prelim  read of the ultrasound of the leg is concerning for a clot from the iliacs all the way down.  Started on heparin.  Mild leukocytosis, troponin greater than 550.  My view of the CT scan of the chest with bilateral PEs.  Awaiting formal read.  BNP normal.  Received a call from the radiologist, concern for massive PE with signs of right heart strain.  Will discuss with critical care.   They were concerned that the patient may need intervention, I discussed the case with interventional radiology who will come and see the patient at bedside.  Will discuss with critical care.  CRITICAL CARE Performed by: Cecilio Asper   Total critical care time: 80 minutes  Critical care time was exclusive of separately billable procedures and treating other patients.  Critical care was necessary to treat or prevent imminent or life-threatening deterioration.  Critical care was time spent personally by me on the following activities: development of treatment plan with patient and/or surrogate as well as nursing, discussions with consultants, evaluation of patient's response to treatment, examination of patient, obtaining history from patient or surrogate, ordering and performing treatments and interventions, ordering and review of laboratory studies, ordering and review of radiographic studies, pulse oximetry and re-evaluation of patient's condition.  The patients results and plan were reviewed and discussed.   Any x-rays performed were independently reviewed by myself.   Differential diagnosis were considered with the presenting HPI.  Medications  sodium chloride (PF) 0.9 % injection (  Not Given 05/11/21 1448)  heparin ADULT infusion 100 units/mL (25000 units/244m) (1,350 Units/hr Intravenous New Bag/Given 05/11/21 1237)  docusate sodium (COLACE) capsule 100 mg (has no administration in time range)  polyethylene glycol (MIRALAX / GLYCOLAX) packet 17 g (has no administration in time range)   dextrose 5 %-0.45 % sodium chloride infusion (has no administration in time range)  pantoprazole (PROTONIX) injection 40 mg (has no administration in time range)  Chlorhexidine Gluconate Cloth 2 % PADS 6 each (6 each Topical Given 05/11/21 1430)  iohexol (OMNIPAQUE) 350 MG/ML injection 80 mL (80 mLs Intravenous Contrast Given 05/11/21 1210)  heparin bolus via infusion 2,500 Units (2,500 Units Intravenous Bolus from Bag 05/11/21 1238)    Vitals:   05/11/21 1245 05/11/21 1300 05/11/21 1315 05/11/21 1330  BP: (!) 128/100 (!) 138/102 (!) 138/117 (!) 118/93  Pulse: (!) 108 (!) 110 (!) 113 (!) 106  Resp: (!) 28 (!) 32 (!) 27 19  Temp:   97.9 F (36.6 C)   TempSrc:   Oral   SpO2: 98% 100% 100% 97%  Weight:      Height:        Final diagnoses:  Acute massive pulmonary embolism (HCC)    Admission/ observation were discussed with the admitting physician, patient and/or family and they are comfortable with the plan.          Final Clinical Impression(s) / ED Diagnoses Final diagnoses:  Acute massive pulmonary embolism ([facility]    Rx / DC Orders ED Discharge Orders     None  [name], DO 05/11/21 1453

## 2021-05-11 NOTE — Progress Notes (Signed)
ANTICOAGULATION CONSULT NOTE - Initial Consult ? ?Pharmacy Consult for IV heparin ?Indication: DVT ? ?No Known Allergies ? ?Patient Measurements: ?Height: 5' 7.5" (171.5 cm) ?Weight: 81.6 kg (180 lb) ?IBW/kg (Calculated) : 67.25 ?Heparin Dosing Weight: 81.6 kg ? ?Vital Signs: ?BP: 115/98 (03/10 1200) ?Pulse Rate: 106 (03/10 1200) ? ?Labs: ?Recent Labs  ?  05/11/21 ?1052 05/11/21 ?1102  ?HGB 14.2 15.3  ?HCT 44.4 45.0  ?PLT 157  --   ?CREATININE  --  1.20  ? ? ?Estimated Creatinine Clearance: 63.4 mL/min (by C-G formula based on SCr of 1.2 mg/dL). ? ? ?Medical History: ?Past Medical History:  ?Diagnosis Date  ? Arthritis   ? OA  ? Cough   ? WHITE SPUTUM OCC, NO FEVER COLD SYMPTOMS MAINLY  ? GERD (gastroesophageal reflux disease)   ? ? ?Medications:  ?Scheduled:  ?Infusions:  ? ?Assessment: ?66 yo male presented to ER with shortness of breath and per notes patient has had a cough and some SOB since have a cholecystectomy 5 weeks ago. Also note that patient's left leg is swollen but not painful. Per dopplers patient with acute DVT to start IV heparin per Md orders. Still ruling out PE - follow up. Baseline CBC and renal function drawn. Patient not on any anticoagulants prior to admission ? ?Goal of Therapy:  ?Heparin level 0.3-0.7 units/ml ?Monitor platelets by anticoagulation protocol: Yes ?  ?Plan:  ?IV heparin bolus of 2500 units then ?IV heparin rate of 1350 units/hr ?Check heparin level 6 hours after start of IV heparin ?Daily CBC and heparin level ? ?Kara Mead ?05/11/2021,12:08 PM ? ? ?

## 2021-05-11 NOTE — ED Triage Notes (Signed)
Patient's wife reports that the patient has had a cough since having a cholecystectomy 5 weeks ago. Today the patient c/o SOB and low sats.  ?Sats in triage 86% on room. Patient placed on O2 2L/min via [place] and sats increased to 90%. O2 increased to 3L/min via [place]. ? ? ?

## 2021-05-12 ENCOUNTER — Other Ambulatory Visit: Payer: Self-pay | Admitting: Physician Assistant

## 2021-05-12 DIAGNOSIS — I2601 Septic pulmonary embolism with acute cor pulmonale: Secondary | ICD-10-CM

## 2021-05-12 LAB — COMPREHENSIVE METABOLIC PANEL
ALT: 56 U/L — ABNORMAL HIGH (ref 0–44)
AST: 27 U/L (ref 15–41)
Albumin: 2.8 g/dL — ABNORMAL LOW (ref 3.5–5.0)
Alkaline Phosphatase: 69 U/L (ref 38–126)
Anion gap: 11 (ref 5–15)
BUN: 16 mg/dL (ref 8–23)
CO2: 24 mmol/L (ref 22–32)
Calcium: 8.9 mg/dL (ref 8.9–10.3)
Chloride: 102 mmol/L (ref 98–111)
Creatinine, Ser: 1.41 mg/dL — ABNORMAL HIGH (ref 0.61–1.24)
GFR, Estimated: 55 mL/min — ABNORMAL LOW (ref >60)
Glucose, Bld: 141 mg/dL — ABNORMAL HIGH (ref 70–99)
Potassium: 4.4 mmol/L (ref 3.5–5.1)
Sodium: 137 mmol/L (ref 135–145)
Total Bilirubin: 0.6 mg/dL (ref 0.3–1.2)
Total Protein: 5.9 g/dL — ABNORMAL LOW (ref 6.5–8.1)

## 2021-05-12 LAB — TROPONIN I (HIGH SENSITIVITY)
Troponin I (High Sensitivity): 582 ng/L (ref <18)
Troponin I (High Sensitivity): 313 ng/L (ref <18)

## 2021-05-12 LAB — MAGNESIUM: Magnesium: 1.9 mg/dL (ref 1.7–2.4)

## 2021-05-12 LAB — HEPARIN LEVEL (UNFRACTIONATED): Heparin Unfractionated: 0.79 IU/mL — ABNORMAL HIGH (ref 0.30–0.70)

## 2021-05-12 MED ORDER — MAGNESIUM SULFATE 2 GM/50ML IV SOLN
2.0000 g | Freq: Once | INTRAVENOUS | Status: AC
  Administered 2021-05-12: 2 g via INTRAVENOUS
  Filled 2021-05-12: qty 50

## 2021-05-12 MED ORDER — APIXABAN 5 MG PO TABS: 5.0000 mg | ORAL_TABLET | Freq: Two times a day (BID) | ORAL | Status: DC

## 2021-05-12 MED ORDER — PANTOPRAZOLE SODIUM 40 MG PO TBEC
40.0000 mg | DELAYED_RELEASE_TABLET | Freq: Every day | ORAL | Status: DC
  Administered 2021-05-12 – 2021-05-13 (×2): 40 mg via ORAL
  Filled 2021-05-12 (×2): qty 1

## 2021-05-12 MED ORDER — APIXABAN 5 MG PO TABS
10.0000 mg | ORAL_TABLET | Freq: Two times a day (BID) | ORAL | Status: DC
  Administered 2021-05-12 – 2021-05-13 (×3): 10 mg via ORAL
  Filled 2021-05-12 (×3): qty 2

## 2021-05-12 MED ORDER — ATORVASTATIN CALCIUM 10 MG PO TABS
10.0000 mg | ORAL_TABLET | Freq: Every day | ORAL | Status: DC
  Administered 2021-05-12 – 2021-05-13 (×2): 10 mg via ORAL
  Filled 2021-05-12 (×2): qty 1

## 2021-05-12 NOTE — Progress Notes (Signed)
Referring Physician(s): [id number]  Supervising Physician: [name]  Patient Status:  [facility] - In-pt  Chief Complaint:  Pulmonary embolism with right heart strain  Brief History:  Juan Snow is a 66 y.o. male who is s/p laparoscopic cholecystectomy on 03/30/2021.   He then traveled to [place] and back about a week ago (3 hour flight).   He noticed his left leg was swelling and he became progressively more short of breath.   He was brought to the ED by his wife.   In the ED he was hypoxemic and required 2-3 L of O2 via [place].   CTA showed = Positive for acute PE with CT evidence of right heart strain (RV/LV Ratio = 1.75) consistent with at least submassive (intermediate risk) PE.    Troponin 562   Venous doppler showed left Iliac vein DVT as well.   He underwent successful mechanical thrombectomy by Dr. Maryelizabeth Kaufmann and Dr. Anselm Pancoast yesterday evening.  Subjective:  Feeling much better. He tells me he did get a little short of breath when he got up to the bathroom.  He also states his feels his left leg is not as swollen.  Allergies: Patient has no known allergies.  Medications: Prior to Admission medications   Medication Sig Start Date End Date Taking? Authorizing Provider  acetaminophen (TYLENOL) 500 MG tablet Take 500 mg by mouth every 6 (six) hours as needed (for pain).   Yes [provider]  aspirin EC 81 MG tablet Take 162 mg by mouth daily as needed.   Yes [provider]  atorvastatin (LIPITOR) 10 MG tablet Take 1 tablet (10 mg total) by mouth daily. 01/11/21  Yes   Cholecalciferol (VITAMIN D3) 50 MCG (2000 UT) TABS Take 6,000 Units by mouth daily.   Yes [provider]  Coenzyme Q10 (CO Q-10 PO) Take 1 capsule by mouth daily.   Yes [provider]  ibuprofen (ADVIL) 200 MG tablet Take 600 mg by mouth every 6 (six) hours as needed (for pain).   Yes [provider]  Menaquinone-7 (VITAMIN K2 PO) Take 1 tablet by mouth  daily.   Yes [provider]  Omega-3 Fatty Acids (FISH OIL) 1000 MG CAPS Take 1,000 capsules by mouth daily.   Yes [provider]  pantoprazole (PROTONIX) 40 MG tablet Take 1 tablet (40 mg total) by mouth daily. 03/31/21  Yes [name], MD  rivaroxaban (XARELTO) 10 MG TABS tablet Take 1 tablet (10 mg total) by mouth daily with breakfast. Patient not taking: Reported on 03/27/2021 06/26/17   [place], [employer], PA-C  vitamin C (ASCORBIC ACID) 500 MG tablet Take 500-1,000 mg by mouth daily. Patient not taking: Reported on 05/11/2021    [provider]     Vital Signs: BP 109/73    Pulse 69    Temp 97.9 F (36.6 C) (Oral)    Resp (!) 30    Ht _0  (1.702 m)    Wt 180 lb 8.9 oz (81.9 kg)    SpO2 96%    BMI 28.28 kg/m   Physical Exam Constitutional:      Appearance: Normal appearance.  HENT:     Head: Normocephalic and atraumatic.  Cardiovascular:     Rate and Rhythm: Normal rate.  Pulmonary:     Effort: Pulmonary effort is normal. No respiratory distress.     Breath sounds: Normal breath sounds.     Comments: Currently on 2L O2 via [place] Musculoskeletal:  Comments: Left leg swelling has improved.  Skin:    General: Skin is warm and dry.  Neurological:     General: No focal deficit present.     Mental Status: He is alert and oriented to person, place, and time.  Psychiatric:        Mood and Affect: Mood normal.        Behavior: Behavior normal.        Thought Content: Thought content normal.        Judgment: Judgment normal.  Right CFV access site looks good. Suture removed without difficulty. No bleeding.  Imaging: CT Angio Chest PE W and/or Wo Contrast  Result Date: 05/11/2021 CLINICAL DATA:  Shortness of breath and left leg swelling. Gallbladder surgery 5 weeks ago. EXAM: CT ANGIOGRAPHY CHEST WITH CONTRAST TECHNIQUE: Multidetector CT imaging of the chest was performed using the standard protocol during bolus administration of intravenous  contrast. Multiplanar CT image reconstructions and MIPs were obtained to evaluate the vascular anatomy. RADIATION DOSE REDUCTION: This exam was performed according to the departmental dose-optimization program which includes automated exposure control, adjustment of the mA and/or kV according to patient size and/or use of iterative reconstruction technique. CONTRAST:  108m OMNIPAQUE IOHEXOL 350 MG/ML SOLN COMPARISON:  None. FINDINGS: Cardiovascular: The heart is normal in size. No pericardial effusion. The aorta is normal in caliber. No dissection. Minimal scattered atherosclerotic calcifications. The pulmonary arterial tree is fairly well opacified. There is submassive bilateral pulmonary emboli. Associated significant right heart strain with RV LV ratio of much greater than 1. The left ventricle is flattened and there is some contrast coursing down the IVC and into the hepatic veins. Mediastinum/Nodes: No mediastinal or hilar mass or adenopathy. The esophagus is grossly normal. There is a moderate to large hiatal hernia noted. Lungs/Pleura: Small left pleural effusion and overlying atelectasis. Peripheral rounded density in the left lower lobe could be atelectasis or pulmonary infarct. Similar findings the peripheral aspect of the right upper lobe. Right apical cavitary process measuring a maximum of 5 cm. This could represent chronic post pneumonic scarring changes but could not exclude neoplasm. Recommend pulmonary consultation and possible PET scan. Upper Abdomen: No significant upper abdominal findings. The gallbladder is surgically absent. Musculoskeletal: No chest wall mass, supraclavicular axillary adenopathy. The bony thorax is intact. Review of the MIP images confirms the above findings. IMPRESSION: 1. Positive for acute PE with CT evidence of right heart strain (RV/LV Ratio = 1.75) consistent with at least submassive (intermediate risk) PE. The presence of right heart strain has been associated with an  increased risk of morbidity and mortality. Please refer to the "PE Focused" order set in EPIC. 2. Small left pleural effusion and overlying atelectasis. 3. Peripheral rounded density in the left lower lobe and right upper lobe could be atelectasis or pulmonary infarct. 4. 5 cm cavitary process in the right lung apex. This could represent chronic post pneumonic scarring changes but could not exclude neoplasm. Recommend pulmonary consultation and possible PET scan. 5. Moderate to large hiatal hernia. 6. Aortic atherosclerosis. These results were called by telephone at the time of interpretation on 05/11/2021 at 12:40 pm to provider DAN FLOYD , who verbally acknowledged these results. Aortic Atherosclerosis (ICD10-I70.0). Electronically Signed   By: PMarijo SanesM.D.   On: 05/11/2021 12:40   IR Angiogram Pulmonary Bilateral Selective  Result Date: 05/12/2021 INDICATION: Briefly, 66year old male with recent cholecystectomy in 03/30/2021 and airline travel a week ago, presenting with large LEFT lower extremity DVT and  large burden PE (RV/LV ratio 1.8). EXAM: Procedures: 1. ULTRASOUND GUIDANCE FOR VENOUS ACCESS2. PULMONARY ARTERIOGRAPHY3. FLUOROSCOPIC GUIDED CATHETER DIRECTED BILATERAL PULMONARY THROMBECTOMY Performing Physician: [name], MD Assistant(s): Markus Daft, MD A qualified trainee/resident or advanced practice provider (APP) was not immediately available to assist with this case. COMPARISON:  CTA PE, CTV AP and chest XR, earlier same day. MEDICATIONS: 3000 IU heparin IV ANESTHESIA/SEDATION: Moderate (conscious) sedation was employed during this procedure. A total of Versed 3.5 mg and Fentanyl 150 mcg was administered intravenously. Moderate Sedation Time: 108 minutes. The patient's level of consciousness and vital signs were monitored continuously by radiology nursing throughout the procedure under my direct supervision. CONTRAST:  100 mL Omnipaque 300 FLUOROSCOPY TIME:  28 minutes 36 seconds (573 mGy)  COMPLICATIONS: None immediate. TECHNIQUE: Informed written consent was obtained from the the patient and/or patient's representative after a discussion of the risks, benefits and alternatives to treatment. Questions regarding the procedure were encouraged and answered. A timeout was performed prior to the initiation of the procedure. Ultrasound scanning was performed of the right groin and demonstrated wide patency of the right common femoral vein. The right groin was prepped and draped in the usual sterile fashion, and a sterile drape was applied covering the operative field. Maximum barrier sterile technique with sterile gowns and gloves were used for the procedure. A timeout was performed prior to the initiation of the procedure. Local anesthesia was provided with 1% lidocaine. Under direct ultrasound guidance, the RIGHT common femoral vein was accessed with a micro puncture kit ultimately allowing placement of a 7 Fr vascular sheath. Ultrasound and fluoroscopic spot images were saved for procedural documentation purposes. With the use of a 0.035 inch Bentson wire and an angled pigtail pulmonary catheter, access past the RIGHT heart and into the main pulmonary artery was obtained. Pre intervention central pulmonary arteriogram was performed. The Bentson wire was exchanged for an Amplatz wire then a 16 Fr, 33 cm GORE dry seal sheath then a 16 Fr Penumbra Lightning Flash aspiration catheter was advanced into the RIGHT then LEFT pulmonary arteries and catheter directed thrombectomy was performed. Intermittent and postprocedural pulmonary arteriograms were performed confirming thrombus removal. Following adequate angiographic result and the catheter and sheath were then removed, and hemostasis was obtained by manual pressure. The patient tolerated the procedure well without immediate postprocedural complication. FINDINGS: 1. Central pulmonary arteriogram demonstrates large burden bilateral pulmonary emboli,  partially-obstructing the pulmonary arterial bifurcations. 2. Post intervention pulmonary arteriograms demonstrating adequate debulking thrombectomy from the pulmonary arterial bifurcations. 3. Residual adherent thrombus was present at the pulmonary arterial bifurcations. 4. On-table clinical improvement of the patient, including HR, SaO2 and O2 requirement metrics. IMPRESSION: 1. Pulmonary arteriogram revealing large burden bilateral pulmonary emboli, partially obstructing the pulmonary arterial bifurcations. 2. RIGHT heart dilation and dilated main PA compatible with pulmonary arterial hypertension. 3. Successful catheter-directed bilateral pulmonary arterial mechanical thrombectomy, with adequate debulking and minimal residual adherent thrombus. PLAN: 1. Continue systemic anticoagulation with heparin gtt and transition to [place] when clinically feasible. 2. Asymptomatic LEFT lower extremity DVT. No VIR intervention recommended at this time. [name], MD [facility] Radiology Electronically Signed   By: [name] M.D.   On: 05/12/2021 11:23   IR Angiogram Selective Each Additional Vessel  Result Date: 05/12/2021 INDICATION: Briefly, 66 year old male with recent cholecystectomy in 03/30/2021 and airline travel a week ago, presenting with large LEFT lower extremity DVT and large burden PE (RV/LV ratio 1.8). EXAM: Procedures: 1. ULTRASOUND GUIDANCE  FOR VENOUS ACCESS2. PULMONARY ARTERIOGRAPHY3. FLUOROSCOPIC GUIDED CATHETER DIRECTED BILATERAL PULMONARY THROMBECTOMY Performing Physician: [name], MD Assistant(s): Markus Daft, MD A qualified trainee/resident or advanced practice provider (APP) was not immediately available to assist with this case. COMPARISON:  CTA PE, CTV AP and chest XR, earlier same day. MEDICATIONS: 3000 IU heparin IV ANESTHESIA/SEDATION: Moderate (conscious) sedation was employed during this procedure. A total of Versed 3.5 mg and Fentanyl  150 mcg was administered intravenously. Moderate Sedation Time: 108 minutes. The patient's level of consciousness and vital signs were monitored continuously by radiology nursing throughout the procedure under my direct supervision. CONTRAST:  100 mL Omnipaque 300 FLUOROSCOPY TIME:  28 minutes 36 seconds (202 mGy) COMPLICATIONS: None immediate. TECHNIQUE: Informed written consent was obtained from the the patient and/or patient's representative after a discussion of the risks, benefits and alternatives to treatment. Questions regarding the procedure were encouraged and answered. A timeout was performed prior to the initiation of the procedure. Ultrasound scanning was performed of the right groin and demonstrated wide patency of the right common femoral vein. The right groin was prepped and draped in the usual sterile fashion, and a sterile drape was applied covering the operative field. Maximum barrier sterile technique with sterile gowns and gloves were used for the procedure. A timeout was performed prior to the initiation of the procedure. Local anesthesia was provided with 1% lidocaine. Under direct ultrasound guidance, the RIGHT common femoral vein was accessed with a micro puncture kit ultimately allowing placement of a 7 Fr vascular sheath. Ultrasound and fluoroscopic spot images were saved for procedural documentation purposes. With the use of a 0.035 inch Bentson wire and an angled pigtail pulmonary catheter, access past the RIGHT heart and into the main pulmonary artery was obtained. Pre intervention central pulmonary arteriogram was performed. The Bentson wire was exchanged for an Amplatz wire then a 16 Fr, 33 cm GORE dry seal sheath then a 16 Fr Penumbra Lightning Flash aspiration catheter was advanced into the RIGHT then LEFT pulmonary arteries and catheter directed thrombectomy was performed. Intermittent and postprocedural pulmonary arteriograms were performed confirming thrombus removal. Following  adequate angiographic result and the catheter and sheath were then removed, and hemostasis was obtained by manual pressure. The patient tolerated the procedure well without immediate postprocedural complication. FINDINGS: 1. Central pulmonary arteriogram demonstrates large burden bilateral pulmonary emboli, partially-obstructing the pulmonary arterial bifurcations. 2. Post intervention pulmonary arteriograms demonstrating adequate debulking thrombectomy from the pulmonary arterial bifurcations. 3. Residual adherent thrombus was present at the pulmonary arterial bifurcations. 4. On-table clinical improvement of the patient, including HR, SaO2 and O2 requirement metrics. IMPRESSION: 1. Pulmonary arteriogram revealing large burden bilateral pulmonary emboli, partially obstructing the pulmonary arterial bifurcations. 2. RIGHT heart dilation and dilated main PA compatible with pulmonary arterial hypertension. 3. Successful catheter-directed bilateral pulmonary arterial mechanical thrombectomy, with adequate debulking and minimal residual adherent thrombus. PLAN: 1. Continue systemic anticoagulation with heparin gtt and transition to [place] when clinically feasible. 2. Asymptomatic LEFT lower extremity DVT. No VIR intervention recommended at this time. [name], MD [facility] Radiology Electronically Signed   By: [name] M.D.   On: 05/12/2021 11:23   IR Angiogram Selective Each Additional Vessel  Result Date: 05/12/2021 INDICATION: Briefly, 66 year old male with recent cholecystectomy in 03/30/2021 and airline travel a week ago, presenting with large LEFT lower extremity DVT and large burden PE (RV/LV ratio 1.8). EXAM: Procedures: 1. ULTRASOUND GUIDANCE FOR VENOUS ACCESS2. PULMONARY ARTERIOGRAPHY3. FLUOROSCOPIC GUIDED CATHETER DIRECTED BILATERAL PULMONARY  THROMBECTOMY Performing Physician: [name], MD Assistant(s): Markus Daft, MD A qualified trainee/resident  or advanced practice provider (APP) was not immediately available to assist with this case. COMPARISON:  CTA PE, CTV AP and chest XR, earlier same day. MEDICATIONS: 3000 IU heparin IV ANESTHESIA/SEDATION: Moderate (conscious) sedation was employed during this procedure. A total of Versed 3.5 mg and Fentanyl 150 mcg was administered intravenously. Moderate Sedation Time: 108 minutes. The patient's level of consciousness and vital signs were monitored continuously by radiology nursing throughout the procedure under my direct supervision. CONTRAST:  100 mL Omnipaque 300 FLUOROSCOPY TIME:  28 minutes 36 seconds (132 mGy) COMPLICATIONS: None immediate. TECHNIQUE: Informed written consent was obtained from the the patient and/or patient's representative after a discussion of the risks, benefits and alternatives to treatment. Questions regarding the procedure were encouraged and answered. A timeout was performed prior to the initiation of the procedure. Ultrasound scanning was performed of the right groin and demonstrated wide patency of the right common femoral vein. The right groin was prepped and draped in the usual sterile fashion, and a sterile drape was applied covering the operative field. Maximum barrier sterile technique with sterile gowns and gloves were used for the procedure. A timeout was performed prior to the initiation of the procedure. Local anesthesia was provided with 1% lidocaine. Under direct ultrasound guidance, the RIGHT common femoral vein was accessed with a micro puncture kit ultimately allowing placement of a 7 Fr vascular sheath. Ultrasound and fluoroscopic spot images were saved for procedural documentation purposes. With the use of a 0.035 inch Bentson wire and an angled pigtail pulmonary catheter, access past the RIGHT heart and into the main pulmonary artery was obtained. Pre intervention central pulmonary arteriogram was performed. The Bentson wire was exchanged for an Amplatz wire then a  16 Fr, 33 cm GORE dry seal sheath then a 16 Fr Penumbra Lightning Flash aspiration catheter was advanced into the RIGHT then LEFT pulmonary arteries and catheter directed thrombectomy was performed. Intermittent and postprocedural pulmonary arteriograms were performed confirming thrombus removal. Following adequate angiographic result and the catheter and sheath were then removed, and hemostasis was obtained by manual pressure. The patient tolerated the procedure well without immediate postprocedural complication. FINDINGS: 1. Central pulmonary arteriogram demonstrates large burden bilateral pulmonary emboli, partially-obstructing the pulmonary arterial bifurcations. 2. Post intervention pulmonary arteriograms demonstrating adequate debulking thrombectomy from the pulmonary arterial bifurcations. 3. Residual adherent thrombus was present at the pulmonary arterial bifurcations. 4. On-table clinical improvement of the patient, including HR, SaO2 and O2 requirement metrics. IMPRESSION: 1. Pulmonary arteriogram revealing large burden bilateral pulmonary emboli, partially obstructing the pulmonary arterial bifurcations. 2. RIGHT heart dilation and dilated main PA compatible with pulmonary arterial hypertension. 3. Successful catheter-directed bilateral pulmonary arterial mechanical thrombectomy, with adequate debulking and minimal residual adherent thrombus. PLAN: 1. Continue systemic anticoagulation with heparin gtt and transition to [place] when clinically feasible. 2. Asymptomatic LEFT lower extremity DVT. No VIR intervention recommended at this time. [name], MD [facility] Radiology Electronically Signed   By: [name] M.D.   On: 05/12/2021 11:23   IR THROMBECT VENO MECH MOD SED  Result Date: 05/12/2021 INDICATION: Briefly, 66 year old male with recent cholecystectomy in 03/30/2021 and airline travel a week ago, presenting with large LEFT lower extremity DVT  and large burden PE (RV/LV ratio 1.8). EXAM: Procedures: 1. ULTRASOUND GUIDANCE FOR VENOUS ACCESS2. PULMONARY ARTERIOGRAPHY3. FLUOROSCOPIC GUIDED CATHETER DIRECTED BILATERAL PULMONARY THROMBECTOMY Performing Physician: [name], MD Assistant(s): Markus Daft, MD  A qualified trainee/resident or advanced practice provider (APP) was not immediately available to assist with this case. COMPARISON:  CTA PE, CTV AP and chest XR, earlier same day. MEDICATIONS: 3000 IU heparin IV ANESTHESIA/SEDATION: Moderate (conscious) sedation was employed during this procedure. A total of Versed 3.5 mg and Fentanyl 150 mcg was administered intravenously. Moderate Sedation Time: 108 minutes. The patient's level of consciousness and vital signs were monitored continuously by radiology nursing throughout the procedure under my direct supervision. CONTRAST:  100 mL Omnipaque 300 FLUOROSCOPY TIME:  28 minutes 36 seconds (944 mGy) COMPLICATIONS: None immediate. TECHNIQUE: Informed written consent was obtained from the the patient and/or patient's representative after a discussion of the risks, benefits and alternatives to treatment. Questions regarding the procedure were encouraged and answered. A timeout was performed prior to the initiation of the procedure. Ultrasound scanning was performed of the right groin and demonstrated wide patency of the right common femoral vein. The right groin was prepped and draped in the usual sterile fashion, and a sterile drape was applied covering the operative field. Maximum barrier sterile technique with sterile gowns and gloves were used for the procedure. A timeout was performed prior to the initiation of the procedure. Local anesthesia was provided with 1% lidocaine. Under direct ultrasound guidance, the RIGHT common femoral vein was accessed with a micro puncture kit ultimately allowing placement of a 7 Fr vascular sheath. Ultrasound and fluoroscopic spot images were saved for procedural documentation  purposes. With the use of a 0.035 inch Bentson wire and an angled pigtail pulmonary catheter, access past the RIGHT heart and into the main pulmonary artery was obtained. Pre intervention central pulmonary arteriogram was performed. The Bentson wire was exchanged for an Amplatz wire then a 16 Fr, 33 cm GORE dry seal sheath then a 16 Fr Penumbra Lightning Flash aspiration catheter was advanced into the RIGHT then LEFT pulmonary arteries and catheter directed thrombectomy was performed. Intermittent and postprocedural pulmonary arteriograms were performed confirming thrombus removal. Following adequate angiographic result and the catheter and sheath were then removed, and hemostasis was obtained by manual pressure. The patient tolerated the procedure well without immediate postprocedural complication. FINDINGS: 1. Central pulmonary arteriogram demonstrates large burden bilateral pulmonary emboli, partially-obstructing the pulmonary arterial bifurcations. 2. Post intervention pulmonary arteriograms demonstrating adequate debulking thrombectomy from the pulmonary arterial bifurcations. 3. Residual adherent thrombus was present at the pulmonary arterial bifurcations. 4. On-table clinical improvement of the patient, including HR, SaO2 and O2 requirement metrics. IMPRESSION: 1. Pulmonary arteriogram revealing large burden bilateral pulmonary emboli, partially obstructing the pulmonary arterial bifurcations. 2. RIGHT heart dilation and dilated main PA compatible with pulmonary arterial hypertension. 3. Successful catheter-directed bilateral pulmonary arterial mechanical thrombectomy, with adequate debulking and minimal residual adherent thrombus. PLAN: 1. Continue systemic anticoagulation with heparin gtt and transition to [place] when clinically feasible. 2. Asymptomatic LEFT lower extremity DVT. No VIR intervention recommended at this time. [name], MD [facility]  Radiology Electronically Signed   By: [name] M.D.   On: 05/12/2021 11:23   IR US Guide Vasc Access Right  Result Date: 05/12/2021 INDICATION: Briefly, 66 year old male with recent cholecystectomy in 03/30/2021 and airline travel a week ago, presenting with large LEFT lower extremity DVT and large burden PE (RV/LV ratio 1.8). EXAM: Procedures: 1. ULTRASOUND GUIDANCE FOR VENOUS ACCESS2. PULMONARY ARTERIOGRAPHY3. FLUOROSCOPIC GUIDED CATHETER DIRECTED BILATERAL PULMONARY THROMBECTOMY Performing Physician: [name], MD Assistant(s): Markus Daft, MD A qualified trainee/resident or advanced practice provider (APP) was not immediately  available to assist with this case. COMPARISON:  CTA PE, CTV AP and chest XR, earlier same day. MEDICATIONS: 3000 IU heparin IV ANESTHESIA/SEDATION: Moderate (conscious) sedation was employed during this procedure. A total of Versed 3.5 mg and Fentanyl 150 mcg was administered intravenously. Moderate Sedation Time: 108 minutes. The patient's level of consciousness and vital signs were monitored continuously by radiology nursing throughout the procedure under my direct supervision. CONTRAST:  100 mL Omnipaque 300 FLUOROSCOPY TIME:  28 minutes 36 seconds (675 mGy) COMPLICATIONS: None immediate. TECHNIQUE: Informed written consent was obtained from the the patient and/or patient's representative after a discussion of the risks, benefits and alternatives to treatment. Questions regarding the procedure were encouraged and answered. A timeout was performed prior to the initiation of the procedure. Ultrasound scanning was performed of the right groin and demonstrated wide patency of the right common femoral vein. The right groin was prepped and draped in the usual sterile fashion, and a sterile drape was applied covering the operative field. Maximum barrier sterile technique with sterile gowns and gloves were used for the procedure. A timeout was performed prior to the initiation of the  procedure. Local anesthesia was provided with 1% lidocaine. Under direct ultrasound guidance, the RIGHT common femoral vein was accessed with a micro puncture kit ultimately allowing placement of a 7 Fr vascular sheath. Ultrasound and fluoroscopic spot images were saved for procedural documentation purposes. With the use of a 0.035 inch Bentson wire and an angled pigtail pulmonary catheter, access past the RIGHT heart and into the main pulmonary artery was obtained. Pre intervention central pulmonary arteriogram was performed. The Bentson wire was exchanged for an Amplatz wire then a 16 Fr, 33 cm GORE dry seal sheath then a 16 Fr Penumbra Lightning Flash aspiration catheter was advanced into the RIGHT then LEFT pulmonary arteries and catheter directed thrombectomy was performed. Intermittent and postprocedural pulmonary arteriograms were performed confirming thrombus removal. Following adequate angiographic result and the catheter and sheath were then removed, and hemostasis was obtained by manual pressure. The patient tolerated the procedure well without immediate postprocedural complication. FINDINGS: 1. Central pulmonary arteriogram demonstrates large burden bilateral pulmonary emboli, partially-obstructing the pulmonary arterial bifurcations. 2. Post intervention pulmonary arteriograms demonstrating adequate debulking thrombectomy from the pulmonary arterial bifurcations. 3. Residual adherent thrombus was present at the pulmonary arterial bifurcations. 4. On-table clinical improvement of the patient, including HR, SaO2 and O2 requirement metrics. IMPRESSION: 1. Pulmonary arteriogram revealing large burden bilateral pulmonary emboli, partially obstructing the pulmonary arterial bifurcations. 2. RIGHT heart dilation and dilated main PA compatible with pulmonary arterial hypertension. 3. Successful catheter-directed bilateral pulmonary arterial mechanical thrombectomy, with adequate debulking and minimal residual  adherent thrombus. PLAN: 1. Continue systemic anticoagulation with heparin gtt and transition to [place] when clinically feasible. 2. Asymptomatic LEFT lower extremity DVT. No VIR intervention recommended at this time. [name], MD [facility] Electronically Signed   By: [name] M.D.   On: 05/12/2021 11:23   VAS [place] IVC/ILIAC (VENOUS ONLY)  Result Date: 05/11/2021 IVC/ILIAC STUDY Patient Name:  HYUN MARSALIS  Date of Exam:   05/11/2021 Medical Rec #: [id number]        Accession #:    [id number] Date of Birth: 10/05/1955         Patient Gender: M Patient Age:   58 years Exam Location:  [facility] Procedure:      VAS [place] IVC/ILIAC (VENOUS ONLY) Referring Phys: DAN FLOYD --------------------------------------------------------------------------------  Limitations: Air/bowel  gas and obesity.  Comparison Study: No previous exam Performing Technologist: Jody Hill RVT, RDMS  Examination Guidelines: A complete evaluation includes B-mode imaging, spectral Doppler, color Doppler, and power Doppler as needed of all accessible portions of each vessel. Bilateral testing is considered an integral part of a complete examination. Limited examinations for reoccurring indications may be performed as noted.  IVC/Iliac Findings: +----------+------+--------+--------------+     IVC     Patent Thrombus    Comments     +----------+------+--------+--------------+  IVC Prox                   not visualized  +----------+------+--------+--------------+  IVC Mid                    not visualized  +----------+------+--------+--------------+  IVC Distal patent                          +----------+------+--------+--------------+  +----------------+---------+-----------+---------+-----------+----------------+        CIV        RT-Patent RT-Thrombus LT-Patent LT-Thrombus     Comments      +----------------+---------+-----------+---------+-----------+----------------+  Common  Iliac      patent                            acute                       Prox                                                                           +----------------+---------+-----------+---------+-----------+----------------+  Common Iliac Mid  patent                                     not visualized L  +----------------+---------+-----------+---------+-----------+----------------+  Common Iliac                                                 not visualized R   Distal                                                             & L         +----------------+---------+-----------+---------+-----------+----------------+  +-------------------------+---------+-----------+---------+-----------+--------+             EIV            RT-Patent RT-Thrombus LT-Patent LT-Thrombus Comments  +-------------------------+---------+-----------+---------+-----------+--------+  External Iliac Vein Prox                                     acute              +-------------------------+---------+-----------+---------+-----------+--------+  External  Iliac Vein Mid                                      acute              +-------------------------+---------+-----------+---------+-----------+--------+  External Iliac Vein                                          acute               Distal                                                                          +-------------------------+---------+-----------+---------+-----------+--------+   Summary: IVC/Iliac: There is no evidence of thrombus involving the distal portion of the IVC. There is no evidence of thrombus involving the right common iliac vein. There is evidence of acute thrombus involving the left common iliac vein. There is no evidence of  thrombus involving the right external iliac vein. There is evidence of acute thrombus involving the left external iliac vein. Visualization of proximal Inferior Vena Cava, mid inferior vena cava, distal common Iliacs and left mid common  Iliac was limited.  *See table(s) above for measurements and observations.  Electronically signed by Monica Martinez MD on 05/11/2021 at 5:14:09 PM.    Final    DG Chest Port 1 View  Result Date: 05/11/2021 CLINICAL DATA:  cough, hypoxia EXAM: PORTABLE CHEST 1 VIEW COMPARISON:  03/27/2021. FINDINGS: New left lateral basilar opacities, suspicious for small pleural effusion with overlying consolidation. Right lung is clear. No visible pneumothorax. IMPRESSION: New left lateral basilar opacities, suspicious for small pleural effusion with overlying pneumonia. Followup PA and lateral chest X-ray is recommended in 3-4 weeks following trial of antibiotic therapy to ensure resolution and exclude underlying malignancy. Electronically Signed   By: Margaretha Sheffield M.D.   On: 05/11/2021 11:13   ECHOCARDIOGRAM COMPLETE  Result Date: 05/11/2021    ECHOCARDIOGRAM REPORT   Patient Name:   Juan Snow Date of Exam: 05/11/2021 Medical Rec #:  [id number]       Height:       67.5 in Accession #:    [id number]      Weight:       180.0 lb Date of Birth:  September 23, 1955        BSA:          1.944 m Patient Age:    81 years        BP:           128/100 mmHg Patient Gender: M               HR:           110 bpm. Exam Location:  Inpatient Procedure: 2D Echo, Cardiac Doppler, Color Doppler and Strain Analysis Indications:    Pulmonary embolus, chest pain  History:        Patient has no prior history of Echocardiogram examinations.  Signs/Symptoms:Chest Pain.  Sonographer:    Luisa Hart RDCS Referring Phys: [id number] DAN FLOYD IMPRESSIONS  1. Left ventricular ejection fraction, by estimation, is 50 to 55%. The left ventricle has low normal function. The left ventricle has no regional wall motion abnormalities. There is mild concentric left ventricular hypertrophy. Left ventricular diastolic parameters are consistent with Grade I diastolic dysfunction (impaired relaxation). There is the interventricular septum is  flattened in diastole ('D' shaped left ventricle), consistent with right ventricular volume overload.  2. Positive McConnell's Sign with RV enlargement and dysfunction, thought basal function is preserved.. Right ventricular systolic function is moderately reduced. The right ventricular size is severely enlarged. There is moderately elevated pulmonary artery systolic pressure. The estimated right ventricular systolic pressure is 70.0 mmHg.  3. Right atrial size was mildly dilated.  4. The mitral valve was not well visualized. No evidence of mitral valve regurgitation. No evidence of mitral stenosis.  5. The aortic valve is tricuspid. Aortic valve regurgitation is not visualized.  6. The inferior vena cava is dilated in size with >50% respiratory variability, suggesting right atrial pressure of 8 mmHg. Conclusion(s)/Recommendation(s): Study is consistent with acue PE and RV dysfunction. FINDINGS  Left Ventricle: Left ventricular ejection fraction, by estimation, is 50 to 55%. The left ventricle has low normal function. The left ventricle has no regional wall motion abnormalities. Global longitudinal strain performed but not reported based on interpreter judgement due to suboptimal tracking. The left ventricular internal cavity size was normal in size. There is mild concentric left ventricular hypertrophy. The interventricular septum is flattened in diastole ('D' shaped left ventricle), consistent with right ventricular volume overload. Left ventricular diastolic parameters are consistent with Grade I diastolic dysfunction (impaired relaxation). Right Ventricle: Positive McConnell's Sign with RV enlargement and dysfunction, thought basal function is preserved. The right ventricular size is severely enlarged. No increase in right ventricular wall thickness. Right ventricular systolic function is moderately reduced. There is moderately elevated pulmonary artery systolic pressure. The tricuspid regurgitant velocity is  3.27 m/s, and with an assumed right atrial pressure of 8 mmHg, the estimated right ventricular systolic pressure is 17.4 mmHg. Left Atrium: Left atrial size was normal in size. Right Atrium: Right atrial size was mildly dilated. Pericardium: There is no evidence of pericardial effusion. Mitral Valve: The mitral valve was not well visualized. No evidence of mitral valve regurgitation. No evidence of mitral valve stenosis. Tricuspid Valve: The tricuspid valve is grossly normal. Tricuspid valve regurgitation is mild . No evidence of tricuspid stenosis. Aortic Valve: The aortic valve is tricuspid. Aortic valve regurgitation is not visualized. Aortic valve mean gradient measures 2.0 mmHg. Aortic valve peak gradient measures 3.9 mmHg. Pulmonic Valve: The pulmonic valve was not well visualized. Pulmonic valve regurgitation is not visualized. Aorta: The aortic root and ascending aorta are structurally normal, with no evidence of dilitation. Venous: The inferior vena cava is dilated in size with greater than 50% respiratory variability, suggesting right atrial pressure of 8 mmHg. IAS/Shunts: No atrial level shunt detected by color flow Doppler.  LEFT VENTRICLE PLAX 2D LVIDd:         4.20 cm     Diastology LVIDs:         2.90 cm     LV e' medial:  3.15 cm/s LV PW:         0.80 cm     LV e' lateral: 5.44 cm/s LV IVS:        1.20 cm  LV Volumes (MOD) LV vol d,  MOD A2C: 34.6 ml LV vol d, MOD A4C: 53.7 ml LV vol s, MOD A2C: 15.0 ml LV vol s, MOD A4C: 31.7 ml LV SV MOD A2C:     19.6 ml LV SV MOD A4C:     53.7 ml LV SV MOD BP:      23.6 ml RIGHT VENTRICLE RV Basal diam:  5.30 cm RV Mid diam:    3.20 cm RV S prime:     7.40 cm/s TAPSE (M-mode): 1.0 cm LEFT ATRIUM             Index        RIGHT ATRIUM           Index LA diam:        2.70 cm 1.39 cm/m   RA Area:     26.90 cm LA Vol (A2C):   9.1 ml  4.66 ml/m   RA Volume:   69.10 ml  35.54 ml/m LA Vol (A4C):   20.6 ml 10.59 ml/m LA Biplane Vol: 14.1 ml 7.25 ml/m  AORTIC VALVE AV  Vmax:           99.17 cm/s AV Vmean:          67.767 cm/s AV VTI:            0.111 m AV Peak Grad:      3.9 mmHg AV Mean Grad:      2.0 mmHg LVOT Vmax:         76.60 cm/s LVOT Vmean:        48.700 cm/s LVOT VTI:          0.095 m LVOT/AV VTI ratio: 0.86  AORTA Ao Asc diam: 3.50 cm TRICUSPID VALVE TR Peak grad:   42.8 mmHg TR Vmax:        327.00 cm/s  SHUNTS Systemic VTI: 0.10 m Rudean Haskell MD Electronically signed by Rudean Haskell MD Signature Date/Time: 05/11/2021/4:04:39 PM    Final    CT VENOGRAM ABD/PEL  Result Date: 05/11/2021 CLINICAL DATA:  Deep vein thrombosis with PE EXAM: CT VENOGRAM ABDOMEN AND PELVIS WITH CONTRAST TECHNIQUE: Multidetector CT imaging of the abdomen and pelvis was performed using the standard protocol following bolus administration of intravenous contrast. RADIATION DOSE REDUCTION: This exam was performed according to the departmental dose-optimization program which includes automated exposure control, adjustment of the mA and/or kV according to patient size and/or use of iterative reconstruction technique. CONTRAST:  117m OMNIPAQUE IOHEXOL 350 MG/ML SOLN COMPARISON:  CTA PE and chest XR, earlier same day. CT AP, 06/26/2015. FINDINGS: Lower chest: Ground-glass LEFT lower lobe pulmonary consolidation, better appreciated and described on recent dedicated CTA chest. Hepatobiliary: Sub-1 cm hypodensities within the LEFT hepatic lobe, similar in appearance 04/24/2015 comparison, and too small to adequately characterize though likely small cysts versus hemangiomas. 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 Gallbladder is surgically absent. No biliary dilatation. Pancreas: No pancreatic ductal dilatation or surrounding inflammatory changes. Spleen: Normal in size without focal abnormality. Adrenals/Urinary Tract: Adrenal glands are unremarkable. Kidneys are normal, without renal calculi, focal lesion, or  hydronephrosis. Bladder is unremarkable. Stomach/Bowel: Moderate hiatus hernia, similar appearance to 2017 comparison. 4.1 cm duodenal diverticulum. Appendix appears normal. Moderate burden of descending colonic and proximal sigmoid diverticulosis. No evidence of bowel wall thickening, distention, or inflammatory changes. Vascular/Lymphatic: No enlarged abdominal or pelvic lymph nodes. *Occlusive proximal LEFT lower extremity DVT, as seen within the LEFT CFV, SFV and imaged  portions of the popliteal vein. *Limited visualization of the LEFT external iliac vein secondary to streak artifact from LEFT hip arthroplasty, however suspected involvement of the LEFT EIV just central to the inguinal ligament. See key images. Reproductive: Prostate measures at the upper limit of normal in size, at 5.0 cm. Other: Small fat-filled RIGHT inguinal hernia versus cord lipoma. Small, broad-based fat-containing umbilical hernia. No abdominopelvic ascites. Musculoskeletal: LEFT hip arthroplasty.  No acute osseous findings. IMPRESSION: Suboptimal evaluation, secondary to streak artifact from LEFT hip arthroplasty within these constraints; 1. Occlusive, proximal LEFT lower extremity DVT involving the peripheral LEFT EIV, CFV, SFV and imaged portions of the popliteal vein. 2. Moderate hiatus hernia, mild prostatomegaly and moderate sigmoid diverticulosis. 3. 1.8 cm area of hyperattenuation within LEFT hepatic lobe, without discrete mass lesion, favor a THAD. Additional incidental, chronic and senescent findings as above. [name], MD [facility] Electronically Signed   By: [name] M.D.   On: 05/11/2021 17:46   VAS [place] LOWER EXTREMITY VENOUS (DVT)  Result Date: 05/11/2021  Lower Venous DVT Study Patient Name:  Juan Snow  Date of Exam:   05/11/2021 Medical Rec #: [id number]        Accession #:    [id number] Date of Birth: [birth year]         Patient Gender: M Patient Age:    63 years Exam Location:  [facility] Procedure:      VAS [place] LOWER EXTREMITY VENOUS (DVT) Referring Phys: DAN FLOYD --------------------------------------------------------------------------------  Indications: Swelling, and SOB.  Risk Factors: Cholecystectomy 03/2021 & recent extended travel. Comparison Study: No previous exams Performing Technologist: Jody Hill RVT, RDMS  Examination Guidelines: A complete evaluation includes B-mode imaging, spectral Doppler, color Doppler, and power Doppler as needed of all accessible portions of each vessel. Bilateral testing is considered an integral part of a complete examination. Limited examinations for reoccurring indications may be performed as noted. The reflux portion of the exam is performed with the patient in reverse Trendelenburg.  +-----+---------------+---------+-----------+----------+--------------+  RIGHT Compressibility Phasicity Spontaneity Properties Thrombus Aging  +-----+---------------+---------+-----------+----------+--------------+  CFV   Full            Yes       Yes                                    +-----+---------------+---------+-----------+----------+--------------+   +---------+---------------+---------+-----------+----------+--------------+  LEFT      Compressibility Phasicity Spontaneity Properties Thrombus Aging  +---------+---------------+---------+-----------+----------+--------------+  CFV       None            No        No                     Acute           +---------+---------------+---------+-----------+----------+--------------+  SFJ       None                                                             +---------+---------------+---------+-----------+----------+--------------+  FV Prox   None            No        No  Acute           +---------+---------------+---------+-----------+----------+--------------+  FV Mid    None            No        No                     Acute            +---------+---------------+---------+-----------+----------+--------------+  FV Distal Partial         No        No                     Acute           +---------+---------------+---------+-----------+----------+--------------+  PFV       None            No        No                     Acute           +---------+---------------+---------+-----------+----------+--------------+  POP       None            No        No                     Acute           +---------+---------------+---------+-----------+----------+--------------+  PTV       None            No        No                     Acute           +---------+---------------+---------+-----------+----------+--------------+  PERO      None            No        No                     Acute           +---------+---------------+---------+-----------+----------+--------------+  Gastroc   Full                                                             +---------+---------------+---------+-----------+----------+--------------+  GSV       Full            Yes       Yes                    @ origin        +---------+---------------+---------+-----------+----------+--------------+    Summary: LEFT: - Findings consistent with acute deep vein thrombosis involving the left common femoral vein, SF junction, left femoral vein, left proximal profunda vein, left popliteal vein, left posterior tibial veins, and left peroneal veins. - There is no evidence of superficial venous thrombosis.  - No cystic structure found in the popliteal fossa.  *See table(s) above for measurements and observations. Electronically signed by Monica Martinez MD on 05/11/2021 at 5:13:29 PM.    Final     Labs:  CBC: Recent Labs    03/29/21 0358 03/30/21 0420 03/31/21 0430 05/11/21 1052 05/11/21 1102  WBC 5.5 5.6 7.0 17.4*  --  HGB 14.4 14.1 14.1 14.2 15.3  HCT 42.9 41.3 42.1 44.4 45.0  PLT 134* 146* 168 157  --     COAGS: No results for input(s): INR, APTT in the last 8760  hours.  BMP: Recent Labs    03/30/21 0420 03/31/21 0430 05/11/21 1052 05/11/21 1102 05/11/21 2332  NA 139 138 136 138 137  K 3.7 4.4 3.8 4.2 4.4  CL 108 107 101 101 102  CO2 _0 --  24  GLUCOSE 109* 123* 148* 149* 141*  BUN _1 CALCIUM 8.6* 8.6* 9.3  --  8.9  CREATININE <0.30* 1.27* 1.14 1.20 1.41*  GFRNONAA NOT CALCULATED >60 >60  --  55*    LIVER FUNCTION TESTS: Recent Labs    03/30/21 0420 03/31/21 0430 05/11/21 1052 05/11/21 2332  BILITOT 1.6* 0.8 0.4 0.6  AST 14* 19 34 27  ALT 38 38 74* 56*  ALKPHOS 79 74 88 69  PROT 5.9* 5.9* 7.4 5.9*  ALBUMIN 2.9* 2.8* 3.5 2.8*    Assessment and Plan:  Pulmonary embolism with right heart start and left lower extremity DVT.  S/P successful mechanical thrombectomy yesterday. Right CFV access ok, suture removed.  Ok to transition to oral anticoagulation.  I will arrange outpatient follow up in the IR clinic with Dr. Maryelizabeth Kaufmann. Patient will be seen in 4 weeks with left lower extremity doppler at that time.  Electronically Signed: Murrell Redden, PA-C 05/12/2021, 11:54 AM    I spent a total of 15 Minutes at the the patient's bedside AND on the patient's hospital floor or unit, greater than 50% of which was counseling/coordinating care for f/u PE thrombectomy.

## 2021-05-12 NOTE — Discharge Summary (Addendum)
Physician Discharge Summary         Patient ID: [place] MRN: [id number] DOB/AGE: October 17, 1955 66 y.o.  Admit date: 05/11/2021 Discharge date: 05/13/2021  Discharge Diagnoses:   Acute submassive pulmonary embolus with high risk features PESI: 115, Class IV, High Risk (4-11.4% 30-day mortality) Bova Score: 5, Stage III, High Risk (42% risk of PE-related complications at 30 days, 10% risk of PE-related mortality)  Discharge summary   66 year old man who presented to [place] [facility] 3/10 for worsening shortness of breath.  PMHx significant for osteoarthritis and GERD.  Recently underwent laparoscopic cholecystectomy with Dr. Harlow [place] 03/30/2021.   Per patient/wife at bedside, patient had reported intermittent SOB since surgery 1/27 with intermittent swelling of his left lower extremity.  They recently took a trip to [place] to celebrate patient's retirement and returned 3/5.  Since then, he has had increasing SOB prompting him to present to the ED today.  Endorses mild CP/SOB, dyspnea, swelling of left lower extremity.  Denies fever/chills, N/V, abdominal pain, dizziness, headache.  Endorses recent surgery and recent travel as above.  Denies tobacco use or exogenous hormone use such as testosterone.  No history of cancer. Denies known family history or personal history of VTE.   On ED arrival, patient was tachycardic to 110s, tachypneic to 20s with O2 sat 97% and normotensive.  CXR demonstrated new left lateral basilar opacities. CTA Chest was positive for acute PE with evidence of R heart strain (RV/LV ratio 1.75).  Heparin gtt was initiated. Interventional radiology was consulted.   Patient was transferred to [facility] for IR evaluation 3/10. He underwent successful mechanical thrombectomy by IR 3/10. He was transitioned from IV heparin to PO Eliquis 3/11 and tolerated it well by 3/12 patient remains stable, ambulating per baseline, and tolerating oral diet. Therefore he will be discharged home.     Discharge Plan by Active Problems   Acute submassive pulmonary embolus with high risk features -Admitted with SOB. CTA demonstrating acute submassive) PE with evidence of R heart strain (RV/LV Ratio 1.75). -He underwent successful mechanical thrombectomy by IR 3/10 PESI: 115, Class IV, High Risk (4-11.4% 30-day mortality) Bova Score: 5, Stage III, High Risk (42% risk of PE-related complications at 30 days, 10% risk of PE-related mortality) P: Will set up follow up with Dr. Silas [place] for PE follow up  Continue PO Eliquis    Patient tolerated interventional radiology mechanical thrombectomy well. He does have clot in his lower extremity on Doppler. Currently stable on heparin infusion. PT OT evaluation. Switch to Eliquis twice daily. Wean from oxygen.  Goal O2 sats above 90%.  Plan for outpatient follow-up and pulmonary clinic.    Significant Hospital tests/ studies  CT angio chest 3/10 .  1 Positive for acute PE with CT evidence of right heart strain (RV/LV Ratio = 1.75) consistent with at least submassive (intermediate risk) PE. The presence of right heart strain has been associated with an increased risk of morbidity and mortality. Please refer to the "PE Focused" order set in EPIC. 2. Small left pleural effusion and overlying atelectasis. 3. Peripheral rounded density in the left lower lobe and right upper lobe could be atelectasis or pulmonary infarct. 4. 5 cm cavitary process in the right lung apex. This could represent chronic post pneumonic scarring changes but could not exclude neoplasm. Recommend pulmonary consultation and possible PET scan. 5. Moderate to large hiatal hernia. 6. Aortic atherosclerosis.   Cardiac ECHO 3/10 EF 50-55% with no regional wll motion abnormality, grade 1  diastolic dysfunction, and positive McMonnells sign with RV enlargement   Lower extremity dopplers Positive acute DVT left  common femoral vein, SF junction, left femoral vein, left  proximal  profunda vein, left popliteal vein, left posterior tibial veins, and left  peroneal veins.   Procedures   3/10 successful mechanical thrombectomy by IR   Culture data/antimicrobials   N/A   Consults  IR    Discharge Exam: BP 116/72    Pulse 80    Temp 98.9 F (37.2 C) (Oral)    Resp (!) 32    Ht $R'5\' 7"'vf$  (1.702 m)    Wt 81.5 kg    SpO2 96%    BMI 28.14 kg/m   General appearance: 66 y.o., male, NAD, conversant  Eyes: anicteric sclerae, moist conjunctivae; no lid-lag; PERRLA, tracking appropriately HENT: NCAT; oropharynx, MMM, no mucosal ulcerations; normal hard and soft palate Neck: Trachea midline; FROM, supple, lymphadenopathy, no JVD Lungs: CTAB, no crackles, no wheeze, with normal respiratory effort and no intercostal retractions CV: RRR, S1, S2, no MRGs  Abdomen: Soft, non-tender; non-distended, BS present  Extremities: No peripheral edema, radial and DP pulses present bilaterally  Skin: Normal temperature, turgor and texture; no rash Psych: Appropriate affect Neuro: Alert and oriented to person and place, no focal deficit    Labs at discharge   Lab Results  Component Value Date   CREATININE 1.36 (H) 05/13/2021   BUN 14 05/13/2021   NA 137 05/13/2021   K 4.0 05/13/2021   CL 102 05/13/2021   CO2 23 05/13/2021   Lab Results  Component Value Date   WBC 12.2 (H) 05/13/2021   HGB 11.1 (L) 05/13/2021   HCT 33.9 (L) 05/13/2021   MCV 88.5 05/13/2021   PLT 134 (L) 05/13/2021   Lab Results  Component Value Date   ALT 56 (H) 05/11/2021   AST 27 05/11/2021   ALKPHOS 69 05/11/2021   BILITOT 0.6 05/11/2021   Lab Results  Component Value Date   INR 0.93 06/18/2017    Current radiological studies    CT Angio Chest PE W and/or Wo Contrast  Result Date: 05/11/2021 CLINICAL DATA:  Shortness of breath and left leg swelling. Gallbladder surgery 5 weeks ago. EXAM: CT ANGIOGRAPHY CHEST WITH CONTRAST TECHNIQUE: Multidetector CT imaging of the chest was performed  using the standard protocol during bolus administration of intravenous contrast. Multiplanar CT image reconstructions and MIPs were obtained to evaluate the vascular anatomy. RADIATION DOSE REDUCTION: This exam was performed according to the departmental dose-optimization program which includes automated exposure control, adjustment of the mA and/or kV according to patient size and/or use of iterative reconstruction technique. CONTRAST:  61mL OMNIPAQUE IOHEXOL 350 MG/ML SOLN COMPARISON:  None. FINDINGS: Cardiovascular: The heart is normal in size. No pericardial effusion. The aorta is normal in caliber. No dissection. Minimal scattered atherosclerotic calcifications. The pulmonary arterial tree is fairly well opacified. There is submassive bilateral pulmonary emboli. Associated significant right heart strain with RV LV ratio of much greater than 1. The left ventricle is flattened and there is some contrast coursing down the IVC and into the hepatic veins. Mediastinum/Nodes: No mediastinal or hilar mass or adenopathy. The esophagus is grossly normal. There is a moderate to large hiatal hernia noted. Lungs/Pleura: Small left pleural effusion and overlying atelectasis. Peripheral rounded density in the left lower lobe could be atelectasis or pulmonary infarct. Similar findings the peripheral aspect of the right upper lobe. Right apical cavitary process measuring a maximum of 5 cm. This  could represent chronic post pneumonic scarring changes but could not exclude neoplasm. Recommend pulmonary consultation and possible PET scan. Upper Abdomen: No significant upper abdominal findings. The gallbladder is surgically absent. Musculoskeletal: No chest wall mass, supraclavicular axillary adenopathy. The bony thorax is intact. Review of the MIP images confirms the above findings. IMPRESSION: 1. Positive for acute PE with CT evidence of right heart strain (RV/LV Ratio = 1.75) consistent with at least submassive (intermediate  risk) PE. The presence of right heart strain has been associated with an increased risk of morbidity and mortality. Please refer to the "PE Focused" order set in EPIC. 2. Small left pleural effusion and overlying atelectasis. 3. Peripheral rounded density in the left lower lobe and right upper lobe could be atelectasis or pulmonary infarct. 4. 5 cm cavitary process in the right lung apex. This could represent chronic post pneumonic scarring changes but could not exclude neoplasm. Recommend pulmonary consultation and possible PET scan. 5. Moderate to large hiatal hernia. 6. Aortic atherosclerosis. These results were called by telephone at the time of interpretation on 05/11/2021 at 12:40 pm to provider DAN FLOYD , who verbally acknowledged these results. Aortic Atherosclerosis (ICD10-I70.0). Electronically Signed   By: Marijo Sanes M.D.   On: 05/11/2021 12:40   IR Angiogram Pulmonary Bilateral Selective  Result Date: 05/12/2021 INDICATION: Briefly, 66 year old male with recent cholecystectomy in 03/30/2021 and airline travel a week ago, presenting with large LEFT lower extremity DVT and large burden PE (RV/LV ratio 1.8). EXAM: Procedures: 1. ULTRASOUND GUIDANCE FOR VENOUS ACCESS2. PULMONARY ARTERIOGRAPHY3. FLUOROSCOPIC GUIDED CATHETER DIRECTED BILATERAL PULMONARY THROMBECTOMY Performing Physician: [name], MD Assistant(s): Markus Daft, MD A qualified trainee/resident or advanced practice provider (APP) was not immediately available to assist with this case. COMPARISON:  CTA PE, CTV AP and chest XR, earlier same day. MEDICATIONS: 3000 IU heparin IV ANESTHESIA/SEDATION: Moderate (conscious) sedation was employed during this procedure. A total of Versed 3.5 mg and Fentanyl 150 mcg was administered intravenously. Moderate Sedation Time: 108 minutes. The patient's level of consciousness and vital signs were monitored continuously by radiology nursing throughout the procedure under my direct supervision. CONTRAST:   100 mL Omnipaque 300 FLUOROSCOPY TIME:  28 minutes 36 seconds (915 mGy) COMPLICATIONS: None immediate. TECHNIQUE: Informed written consent was obtained from the the patient and/or patient's representative after a discussion of the risks, benefits and alternatives to treatment. Questions regarding the procedure were encouraged and answered. A timeout was performed prior to the initiation of the procedure. Ultrasound scanning was performed of the right groin and demonstrated wide patency of the right common femoral vein. The right groin was prepped and draped in the usual sterile fashion, and a sterile drape was applied covering the operative field. Maximum barrier sterile technique with sterile gowns and gloves were used for the procedure. A timeout was performed prior to the initiation of the procedure. Local anesthesia was provided with 1% lidocaine. Under direct ultrasound guidance, the RIGHT common femoral vein was accessed with a micro puncture kit ultimately allowing placement of a 7 Fr vascular sheath. Ultrasound and fluoroscopic spot images were saved for procedural documentation purposes. With the use of a 0.035 inch Bentson wire and an angled pigtail pulmonary catheter, access past the RIGHT heart and into the main pulmonary artery was obtained. Pre intervention central pulmonary arteriogram was performed. The Bentson wire was exchanged for an Amplatz wire then a 16 Fr, 33 cm GORE dry seal sheath then a 16 Fr Penumbra Lightning Flash aspiration catheter was advanced into the  RIGHT then LEFT pulmonary arteries and catheter directed thrombectomy was performed. Intermittent and postprocedural pulmonary arteriograms were performed confirming thrombus removal. Following adequate angiographic result and the catheter and sheath were then removed, and hemostasis was obtained by manual pressure. The patient tolerated the procedure well without immediate postprocedural complication. FINDINGS: 1. Central pulmonary  arteriogram demonstrates large burden bilateral pulmonary emboli, partially-obstructing the pulmonary arterial bifurcations. 2. Post intervention pulmonary arteriograms demonstrating adequate debulking thrombectomy from the pulmonary arterial bifurcations. 3. Residual adherent thrombus was present at the pulmonary arterial bifurcations. 4. On-table clinical improvement of the patient, including HR, SaO2 and O2 requirement metrics. IMPRESSION: 1. Pulmonary arteriogram revealing large burden bilateral pulmonary emboli, partially obstructing the pulmonary arterial bifurcations. 2. RIGHT heart dilation and dilated main PA compatible with pulmonary arterial hypertension. 3. Successful catheter-directed bilateral pulmonary arterial mechanical thrombectomy, with adequate debulking and minimal residual adherent thrombus. PLAN: 1. Continue systemic anticoagulation with heparin gtt and transition to [place] when clinically feasible. 2. Asymptomatic LEFT lower extremity DVT. No VIR intervention recommended at this time. [name], MD [facility] Electronically Signed   By: [name] M.D.   On: 05/12/2021 11:23   IR Angiogram Selective Each Additional Vessel  Result Date: 05/12/2021 INDICATION: Briefly, 66 year old male with recent cholecystectomy in 03/30/2021 and airline travel a week ago, presenting with large LEFT lower extremity DVT and large burden PE (RV/LV ratio 1.8). EXAM: Procedures: 1. ULTRASOUND GUIDANCE FOR VENOUS ACCESS2. PULMONARY ARTERIOGRAPHY3. FLUOROSCOPIC GUIDED CATHETER DIRECTED BILATERAL PULMONARY THROMBECTOMY Performing Physician: [name], MD Assistant(s): Markus Daft, MD A qualified trainee/resident or advanced practice provider (APP) was not immediately available to assist with this case. COMPARISON:  CTA PE, CTV AP and chest XR, earlier same day. MEDICATIONS: 3000 IU heparin IV ANESTHESIA/SEDATION: Moderate (conscious) sedation was  employed during this procedure. A total of Versed 3.5 mg and Fentanyl 150 mcg was administered intravenously. Moderate Sedation Time: 108 minutes. The patient's level of consciousness and vital signs were monitored continuously by radiology nursing throughout the procedure under my direct supervision. CONTRAST:  100 mL Omnipaque 300 FLUOROSCOPY TIME:  28 minutes 36 seconds (315 mGy) COMPLICATIONS: None immediate. TECHNIQUE: Informed written consent was obtained from the the patient and/or patient's representative after a discussion of the risks, benefits and alternatives to treatment. Questions regarding the procedure were encouraged and answered. A timeout was performed prior to the initiation of the procedure. Ultrasound scanning was performed of the right groin and demonstrated wide patency of the right common femoral vein. The right groin was prepped and draped in the usual sterile fashion, and a sterile drape was applied covering the operative field. Maximum barrier sterile technique with sterile gowns and gloves were used for the procedure. A timeout was performed prior to the initiation of the procedure. Local anesthesia was provided with 1% lidocaine. Under direct ultrasound guidance, the RIGHT common femoral vein was accessed with a micro puncture kit ultimately allowing placement of a 7 Fr vascular sheath. Ultrasound and fluoroscopic spot images were saved for procedural documentation purposes. With the use of a 0.035 inch Bentson wire and an angled pigtail pulmonary catheter, access past the RIGHT heart and into the main pulmonary artery was obtained. Pre intervention central pulmonary arteriogram was performed. The Bentson wire was exchanged for an Amplatz wire then a 16 Fr, 33 cm GORE dry seal sheath then a 16 Fr Penumbra Lightning Flash aspiration catheter was advanced into the RIGHT then LEFT pulmonary arteries and catheter directed thrombectomy was performed.  Intermittent and postprocedural pulmonary  arteriograms were performed confirming thrombus removal. Following adequate angiographic result and the catheter and sheath were then removed, and hemostasis was obtained by manual pressure. The patient tolerated the procedure well without immediate postprocedural complication. FINDINGS: 1. Central pulmonary arteriogram demonstrates large burden bilateral pulmonary emboli, partially-obstructing the pulmonary arterial bifurcations. 2. Post intervention pulmonary arteriograms demonstrating adequate debulking thrombectomy from the pulmonary arterial bifurcations. 3. Residual adherent thrombus was present at the pulmonary arterial bifurcations. 4. On-table clinical improvement of the patient, including HR, SaO2 and O2 requirement metrics. IMPRESSION: 1. Pulmonary arteriogram revealing large burden bilateral pulmonary emboli, partially obstructing the pulmonary arterial bifurcations. 2. RIGHT heart dilation and dilated main PA compatible with pulmonary arterial hypertension. 3. Successful catheter-directed bilateral pulmonary arterial mechanical thrombectomy, with adequate debulking and minimal residual adherent thrombus. PLAN: 1. Continue systemic anticoagulation with heparin gtt and transition to [place] when clinically feasible. 2. Asymptomatic LEFT lower extremity DVT. No VIR intervention recommended at this time. [name], MD [facility]iology Electronically Signed   By: [name] M.D.   On: 05/12/2021 11:23   IR Angiogram Selective Each Additional Vessel  Result Date: 05/12/2021 INDICATION: Briefly, 66 year old male with recent cholecystectomy in 03/30/2021 and airline travel a week ago, presenting with large LEFT lower extremity DVT and large burden PE (RV/LV ratio 1.8). EXAM: Procedures: 1. ULTRASOUND GUIDANCE FOR VENOUS ACCESS2. PULMONARY ARTERIOGRAPHY3. FLUOROSCOPIC GUIDED CATHETER DIRECTED BILATERAL PULMONARY THROMBECTOMY Performing Physician: [name], MD Assistant(s): Markus Daft, MD A qualified trainee/resident or advanced practice provider (APP) was not immediately available to assist with this case. COMPARISON:  CTA PE, CTV AP and chest XR, earlier same day. MEDICATIONS: 3000 IU heparin IV ANESTHESIA/SEDATION: Moderate (conscious) sedation was employed during this procedure. A total of Versed 3.5 mg and Fentanyl 150 mcg was administered intravenously. Moderate Sedation Time: 108 minutes. The patient's level of consciousness and vital signs were monitored continuously by radiology nursing throughout the procedure under my direct supervision. CONTRAST:  100 mL Omnipaque 300 FLUOROSCOPY TIME:  28 minutes 36 seconds (858 mGy) COMPLICATIONS: None immediate. TECHNIQUE: Informed written consent was obtained from the the patient and/or patient's representative after a discussion of the risks, benefits and alternatives to treatment. Questions regarding the procedure were encouraged and answered. A timeout was performed prior to the initiation of the procedure. Ultrasound scanning was performed of the right groin and demonstrated wide patency of the right common femoral vein. The right groin was prepped and draped in the usual sterile fashion, and a sterile drape was applied covering the operative field. Maximum barrier sterile technique with sterile gowns and gloves were used for the procedure. A timeout was performed prior to the initiation of the procedure. Local anesthesia was provided with 1% lidocaine. Under direct ultrasound guidance, the RIGHT common femoral vein was accessed with a micro puncture kit ultimately allowing placement of a 7 Fr vascular sheath. Ultrasound and fluoroscopic spot images were saved for procedural documentation purposes. With the use of a 0.035 inch Bentson wire and an angled pigtail pulmonary catheter, access past the RIGHT heart and into the main pulmonary artery was obtained. Pre intervention central pulmonary arteriogram was  performed. The Bentson wire was exchanged for an Amplatz wire then a 16 Fr, 33 cm GORE dry seal sheath then a 16 Fr Penumbra Lightning Flash aspiration catheter was advanced into the RIGHT then LEFT pulmonary arteries and catheter directed thrombectomy was performed. Intermittent and postprocedural pulmonary arteriograms were performed confirming thrombus removal.  Following adequate angiographic result and the catheter and sheath were then removed, and hemostasis was obtained by manual pressure. The patient tolerated the procedure well without immediate postprocedural complication. FINDINGS: 1. Central pulmonary arteriogram demonstrates large burden bilateral pulmonary emboli, partially-obstructing the pulmonary arterial bifurcations. 2. Post intervention pulmonary arteriograms demonstrating adequate debulking thrombectomy from the pulmonary arterial bifurcations. 3. Residual adherent thrombus was present at the pulmonary arterial bifurcations. 4. On-table clinical improvement of the patient, including HR, SaO2 and O2 requirement metrics. IMPRESSION: 1. Pulmonary arteriogram revealing large burden bilateral pulmonary emboli, partially obstructing the pulmonary arterial bifurcations. 2. RIGHT heart dilation and dilated main PA compatible with pulmonary arterial hypertension. 3. Successful catheter-directed bilateral pulmonary arterial mechanical thrombectomy, with adequate debulking and minimal residual adherent thrombus. PLAN: 1. Continue systemic anticoagulation with heparin gtt and transition to [employer] when clinically feasible. 2. Asymptomatic LEFT lower extremity DVT. No VIR intervention recommended at this time. [name], MD [facility] Radiology Electronically Signed   By: [name] M.D.   On: 05/12/2021 11:23   IR THROMBECT VENO MECH MOD SED  Result Date: 05/12/2021 INDICATION: Briefly, 66 year old male with recent cholecystectomy in 03/30/2021 and  airline travel a week ago, presenting with large LEFT lower extremity DVT and large burden PE (RV/LV ratio 1.8). EXAM: Procedures: 1. ULTRASOUND GUIDANCE FOR VENOUS ACCESS2. PULMONARY ARTERIOGRAPHY3. FLUOROSCOPIC GUIDED CATHETER DIRECTED BILATERAL PULMONARY THROMBECTOMY Performing Physician: [name], MD Assistant(s): Markus Daft, MD A qualified trainee/resident or advanced practice provider (APP) was not immediately available to assist with this case. COMPARISON:  CTA PE, CTV AP and chest XR, earlier same day. MEDICATIONS: 3000 IU heparin IV ANESTHESIA/SEDATION: Moderate (conscious) sedation was employed during this procedure. A total of Versed 3.5 mg and Fentanyl 150 mcg was administered intravenously. Moderate Sedation Time: 108 minutes. The patient's level of consciousness and vital signs were monitored continuously by radiology nursing throughout the procedure under my direct supervision. CONTRAST:  100 mL Omnipaque 300 FLUOROSCOPY TIME:  28 minutes 36 seconds (481 mGy) COMPLICATIONS: None immediate. TECHNIQUE: Informed written consent was obtained from the the patient and/or patient's representative after a discussion of the risks, benefits and alternatives to treatment. Questions regarding the procedure were encouraged and answered. A timeout was performed prior to the initiation of the procedure. Ultrasound scanning was performed of the right groin and demonstrated wide patency of the right common femoral vein. The right groin was prepped and draped in the usual sterile fashion, and a sterile drape was applied covering the operative field. Maximum barrier sterile technique with sterile gowns and gloves were used for the procedure. A timeout was performed prior to the initiation of the procedure. Local anesthesia was provided with 1% lidocaine. Under direct ultrasound guidance, the RIGHT common femoral vein was accessed with a micro puncture kit ultimately allowing placement of a 7 Fr vascular sheath.  Ultrasound and fluoroscopic spot images were saved for procedural documentation purposes. With the use of a 0.035 inch Bentson wire and an angled pigtail pulmonary catheter, access past the RIGHT heart and into the main pulmonary artery was obtained. Pre intervention central pulmonary arteriogram was performed. The Bentson wire was exchanged for an Amplatz wire then a 16 Fr, 33 cm GORE dry seal sheath then a 16 Fr Penumbra Lightning Flash aspiration catheter was advanced into the RIGHT then LEFT pulmonary arteries and catheter directed thrombectomy was performed. Intermittent and postprocedural pulmonary arteriograms were performed confirming thrombus removal. Following adequate angiographic result and the catheter and sheath were then  removed, and hemostasis was obtained by manual pressure. The patient tolerated the procedure well without immediate postprocedural complication. FINDINGS: 1. Central pulmonary arteriogram demonstrates large burden bilateral pulmonary emboli, partially-obstructing the pulmonary arterial bifurcations. 2. Post intervention pulmonary arteriograms demonstrating adequate debulking thrombectomy from the pulmonary arterial bifurcations. 3. Residual adherent thrombus was present at the pulmonary arterial bifurcations. 4. On-table clinical improvement of the patient, including HR, SaO2 and O2 requirement metrics. IMPRESSION: 1. Pulmonary arteriogram revealing large burden bilateral pulmonary emboli, partially obstructing the pulmonary arterial bifurcations. 2. RIGHT heart dilation and dilated main PA compatible with pulmonary arterial hypertension. 3. Successful catheter-directed bilateral pulmonary arterial mechanical thrombectomy, with adequate debulking and minimal residual adherent thrombus. PLAN: 1. Continue systemic anticoagulation with heparin gtt and transition to [place] when clinically feasible. 2. Asymptomatic LEFT lower extremity DVT. No VIR intervention recommended at this time. [name], MD [facility] Radiology Electronically Signed   By: [name] M.D.   On: 05/12/2021 11:23   IR US Guide Vasc Access Right  Result Date: 05/12/2021 INDICATION: Briefly, 66 year old male with recent cholecystectomy in 03/30/2021 and airline travel a week ago, presenting with large LEFT lower extremity DVT and large burden PE (RV/LV ratio 1.8). EXAM: Procedures: 1. ULTRASOUND GUIDANCE FOR VENOUS ACCESS2. PULMONARY ARTERIOGRAPHY3. FLUOROSCOPIC GUIDED CATHETER DIRECTED BILATERAL PULMONARY THROMBECTOMY Performing Physician: [name], MD Assistant(s): Markus Daft, MD A qualified trainee/resident or advanced practice provider (APP) was not immediately available to assist with this case. COMPARISON:  CTA PE, CTV AP and chest XR, earlier same day. MEDICATIONS: 3000 IU heparin IV ANESTHESIA/SEDATION: Moderate (conscious) sedation was employed during this procedure. A total of Versed 3.5 mg and Fentanyl 150 mcg was administered intravenously. Moderate Sedation Time: 108 minutes. The patient's level of consciousness and vital signs were monitored continuously by radiology nursing throughout the procedure under my direct supervision. CONTRAST:  100 mL Omnipaque 300 FLUOROSCOPY TIME:  28 minutes 36 seconds (502 mGy) COMPLICATIONS: None immediate. TECHNIQUE: Informed written consent was obtained from the the patient and/or patient's representative after a discussion of the risks, benefits and alternatives to treatment. Questions regarding the procedure were encouraged and answered. A timeout was performed prior to the initiation of the procedure. Ultrasound scanning was performed of the right groin and demonstrated wide patency of the right common femoral vein. The right groin was prepped and draped in the usual sterile fashion, and a sterile drape was applied covering the operative field. Maximum barrier sterile technique with sterile gowns and gloves were used for  the procedure. A timeout was performed prior to the initiation of the procedure. Local anesthesia was provided with 1% lidocaine. Under direct ultrasound guidance, the RIGHT common femoral vein was accessed with a micro puncture kit ultimately allowing placement of a 7 Fr vascular sheath. Ultrasound and fluoroscopic spot images were saved for procedural documentation purposes. With the use of a 0.035 inch Bentson wire and an angled pigtail pulmonary catheter, access past the RIGHT heart and into the main pulmonary artery was obtained. Pre intervention central pulmonary arteriogram was performed. The Bentson wire was exchanged for an Amplatz wire then a 16 Fr, 33 cm GORE dry seal sheath then a 16 Fr Penumbra Lightning Flash aspiration catheter was advanced into the RIGHT then LEFT pulmonary arteries and catheter directed thrombectomy was performed. Intermittent and postprocedural pulmonary arteriograms were performed confirming thrombus removal. Following adequate angiographic result and the catheter and sheath were then removed, and hemostasis was obtained by manual pressure. The patient tolerated  the procedure well without immediate postprocedural complication. FINDINGS: 1. Central pulmonary arteriogram demonstrates large burden bilateral pulmonary emboli, partially-obstructing the pulmonary arterial bifurcations. 2. Post intervention pulmonary arteriograms demonstrating adequate debulking thrombectomy from the pulmonary arterial bifurcations. 3. Residual adherent thrombus was present at the pulmonary arterial bifurcations. 4. On-table clinical improvement of the patient, including HR, SaO2 and O2 requirement metrics. IMPRESSION: 1. Pulmonary arteriogram revealing large burden bilateral pulmonary emboli, partially obstructing the pulmonary arterial bifurcations. 2. RIGHT heart dilation and dilated main PA compatible with pulmonary arterial hypertension. 3. Successful catheter-directed bilateral pulmonary arterial  mechanical thrombectomy, with adequate debulking and minimal residual adherent thrombus. PLAN: 1. Continue systemic anticoagulation with heparin gtt and transition to [place] when clinically feasible. 2. Asymptomatic LEFT lower extremity DVT. No VIR intervention recommended at this time. [name], MD [facility] Radiology Electronically Signed   By: [name] M.D.   On: 05/12/2021 11:23   VAS [place] IVC/ILIAC (VENOUS ONLY)  Result Date: 05/11/2021 IVC/ILIAC STUDY Patient Name:  TANMAY HALTEMAN  Date of Exam:   05/11/2021 Medical Rec #: [id number]        Accession #:    [id number] Date of Birth: Apr 24, 1955         Patient Gender: M Patient Age:   71 years Exam Location:  [facility] Procedure:      VAS [place] IVC/ILIAC (VENOUS ONLY) Referring Phys: DAN FLOYD --------------------------------------------------------------------------------  Limitations: Air/bowel gas and obesity.  Comparison Study: No previous exam Performing Technologist: Jody Hill RVT, RDMS  Examination Guidelines: A complete evaluation includes B-mode imaging, spectral Doppler, color Doppler, and power Doppler as needed of all accessible portions of each vessel. Bilateral testing is considered an integral part of a complete examination. Limited examinations for reoccurring indications may be performed as noted.  IVC/Iliac Findings: +----------+------+--------+--------------+     IVC     Patent Thrombus    Comments     +----------+------+--------+--------------+  IVC Prox                   not visualized  +----------+------+--------+--------------+  IVC Mid                    not visualized  +----------+------+--------+--------------+  IVC Distal patent                          +----------+------+--------+--------------+  +----------------+---------+-----------+---------+-----------+----------------+        CIV        RT-Patent RT-Thrombus LT-Patent LT-Thrombus     Comments       +----------------+---------+-----------+---------+-----------+----------------+  Common Iliac      patent                            acute                       Prox                                                                           +----------------+---------+-----------+---------+-----------+----------------+  Common Iliac Mid  patent  not visualized L  +----------------+---------+-----------+---------+-----------+----------------+  Common Iliac                                                 not visualized R   Distal                                                             & L         +----------------+---------+-----------+---------+-----------+----------------+  +-------------------------+---------+-----------+---------+-----------+--------+             EIV            RT-Patent RT-Thrombus LT-Patent LT-Thrombus Comments  +-------------------------+---------+-----------+---------+-----------+--------+  External Iliac Vein Prox                                     acute              +-------------------------+---------+-----------+---------+-----------+--------+  External Iliac Vein Mid                                      acute              +-------------------------+---------+-----------+---------+-----------+--------+  External Iliac Vein                                          acute               Distal                                                                          +-------------------------+---------+-----------+---------+-----------+--------+   Summary: IVC/Iliac: There is no evidence of thrombus involving the distal portion of the IVC. There is no evidence of thrombus involving the right common iliac vein. There is evidence of acute thrombus involving the left common iliac vein. There is no evidence of  thrombus involving the right external iliac vein. There is evidence of acute thrombus involving the left external iliac vein. Visualization of proximal  Inferior Vena Cava, mid inferior vena cava, distal common Iliacs and left mid common Iliac was limited.  *See table(s) above for measurements and observations.  Electronically signed by Monica Martinez MD on 05/11/2021 at 5:14:09 PM.    Final    DG Chest Port 1 View  Result Date: 05/11/2021 CLINICAL DATA:  cough, hypoxia EXAM: PORTABLE CHEST 1 VIEW COMPARISON:  03/27/2021. FINDINGS: New left lateral basilar opacities, suspicious for small pleural effusion with overlying consolidation. Right lung is clear. No visible pneumothorax. IMPRESSION: New left lateral basilar opacities, suspicious for small pleural effusion with overlying pneumonia. Followup PA and lateral chest X-ray is recommended in 3-4 weeks following trial of antibiotic  therapy to ensure resolution and exclude underlying malignancy. Electronically Signed   By: Margaretha Sheffield M.D.   On: 05/11/2021 11:13   ECHOCARDIOGRAM COMPLETE  Result Date: 05/11/2021    ECHOCARDIOGRAM REPORT   Patient Name:   ARLENE GENOVA Date of Exam: 05/11/2021 Medical Rec #:  [id number]       Height:       67.5 in Accession #:    [id number]      Weight:       180.0 lb Date of Birth:  11-28-1955        BSA:          1.944 m Patient Age:    62 years        BP:           128/100 mmHg Patient Gender: M               HR:           110 bpm. Exam Location:  Inpatient Procedure: 2D Echo, Cardiac Doppler, Color Doppler and Strain Analysis Indications:    Pulmonary embolus, chest pain  History:        Patient has no prior history of Echocardiogram examinations.                 Signs/Symptoms:Chest Pain.  Sonographer:    Luisa Hart RDCS Referring Phys: [id number] DAN FLOYD IMPRESSIONS  1. Left ventricular ejection fraction, by estimation, is 50 to 55%. The left ventricle has low normal function. The left ventricle has no regional wall motion abnormalities. There is mild concentric left ventricular hypertrophy. Left ventricular diastolic parameters are consistent with Grade I  diastolic dysfunction (impaired relaxation). There is the interventricular septum is flattened in diastole ('D' shaped left ventricle), consistent with right ventricular volume overload.  2. Positive McConnell's Sign with RV enlargement and dysfunction, thought basal function is preserved.. Right ventricular systolic function is moderately reduced. The right ventricular size is severely enlarged. There is moderately elevated pulmonary artery systolic pressure. The estimated right ventricular systolic pressure is 47.4 mmHg.  3. Right atrial size was mildly dilated.  4. The mitral valve was not well visualized. No evidence of mitral valve regurgitation. No evidence of mitral stenosis.  5. The aortic valve is tricuspid. Aortic valve regurgitation is not visualized.  6. The inferior vena cava is dilated in size with >50% respiratory variability, suggesting right atrial pressure of 8 mmHg. Conclusion(s)/Recommendation(s): Study is consistent with acue PE and RV dysfunction. FINDINGS  Left Ventricle: Left ventricular ejection fraction, by estimation, is 50 to 55%. The left ventricle has low normal function. The left ventricle has no regional wall motion abnormalities. Global longitudinal strain performed but not reported based on interpreter judgement due to suboptimal tracking. The left ventricular internal cavity size was normal in size. There is mild concentric left ventricular hypertrophy. The interventricular septum is flattened in diastole ('D' shaped left ventricle), consistent with right ventricular volume overload. Left ventricular diastolic parameters are consistent with Grade I diastolic dysfunction (impaired relaxation). Right Ventricle: Positive McConnell's Sign with RV enlargement and dysfunction, thought basal function is preserved. The right ventricular size is severely enlarged. No increase in right ventricular wall thickness. Right ventricular systolic function is moderately reduced. There is moderately  elevated pulmonary artery systolic pressure. The tricuspid regurgitant velocity is 3.27 m/s, and with an assumed right atrial pressure of 8 mmHg, the estimated right ventricular systolic pressure is 25.9 mmHg. Left Atrium: Left atrial size was normal in size. Right Atrium:  Right atrial size was mildly dilated. Pericardium: There is no evidence of pericardial effusion. Mitral Valve: The mitral valve was not well visualized. No evidence of mitral valve regurgitation. No evidence of mitral valve stenosis. Tricuspid Valve: The tricuspid valve is grossly normal. Tricuspid valve regurgitation is mild . No evidence of tricuspid stenosis. Aortic Valve: The aortic valve is tricuspid. Aortic valve regurgitation is not visualized. Aortic valve mean gradient measures 2.0 mmHg. Aortic valve peak gradient measures 3.9 mmHg. Pulmonic Valve: The pulmonic valve was not well visualized. Pulmonic valve regurgitation is not visualized. Aorta: The aortic root and ascending aorta are structurally normal, with no evidence of dilitation. Venous: The inferior vena cava is dilated in size with greater than 50% respiratory variability, suggesting right atrial pressure of 8 mmHg. IAS/Shunts: No atrial level shunt detected by color flow Doppler.  LEFT VENTRICLE PLAX 2D LVIDd:         4.20 cm     Diastology LVIDs:         2.90 cm     LV e' medial:  3.15 cm/s LV PW:         0.80 cm     LV e' lateral: 5.44 cm/s LV IVS:        1.20 cm  LV Volumes (MOD) LV vol d, MOD A2C: 34.6 ml LV vol d, MOD A4C: 53.7 ml LV vol s, MOD A2C: 15.0 ml LV vol s, MOD A4C: 31.7 ml LV SV MOD A2C:     19.6 ml LV SV MOD A4C:     53.7 ml LV SV MOD BP:      23.6 ml RIGHT VENTRICLE RV Basal diam:  5.30 cm RV Mid diam:    3.20 cm RV S prime:     7.40 cm/s TAPSE (M-mode): 1.0 cm LEFT ATRIUM             Index        RIGHT ATRIUM           Index LA diam:        2.70 cm 1.39 cm/m   RA Area:     26.90 cm LA Vol (A2C):   9.1 ml  4.66 ml/m   RA Volume:   69.10 ml  35.54 ml/m LA  Vol (A4C):   20.6 ml 10.59 ml/m LA Biplane Vol: 14.1 ml 7.25 ml/m  AORTIC VALVE AV Vmax:           99.17 cm/s AV Vmean:          67.767 cm/s AV VTI:            0.111 m AV Peak Grad:      3.9 mmHg AV Mean Grad:      2.0 mmHg LVOT Vmax:         76.60 cm/s LVOT Vmean:        48.700 cm/s LVOT VTI:          0.095 m LVOT/AV VTI ratio: 0.86  AORTA Ao Asc diam: 3.50 cm TRICUSPID VALVE TR Peak grad:   42.8 mmHg TR Vmax:        327.00 cm/s  SHUNTS Systemic VTI: 0.10 m Rudean Haskell MD Electronically signed by Rudean Haskell MD Signature Date/Time: 05/11/2021/4:04:39 PM    Final    CT VENOGRAM ABD/PEL  Result Date: 05/11/2021 CLINICAL DATA:  Deep vein thrombosis with PE EXAM: CT VENOGRAM ABDOMEN AND PELVIS WITH CONTRAST TECHNIQUE: Multidetector CT imaging of the abdomen and pelvis was performed using the standard  protocol following bolus administration of intravenous contrast. RADIATION DOSE REDUCTION: This exam was performed according to the departmental dose-optimization program which includes automated exposure control, adjustment of the mA and/or kV according to patient size and/or use of iterative reconstruction technique. CONTRAST:  166mL OMNIPAQUE IOHEXOL 350 MG/ML SOLN COMPARISON:  CTA PE and chest XR, earlier same day. CT AP, 06/26/2015. FINDINGS: Lower chest: Ground-glass LEFT lower lobe pulmonary consolidation, better appreciated and described on recent dedicated CTA chest. Hepatobiliary: Sub-1 cm hypodensities within the LEFT hepatic lobe, similar in appearance 04/24/2015 comparison, and too small to adequately characterize though likely small cysts versus hemangiomas. 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 Gallbladder is surgically absent. No biliary dilatation. Pancreas: No pancreatic ductal dilatation or surrounding inflammatory changes. Spleen: Normal in size without focal abnormality. Adrenals/Urinary Tract: Adrenal glands are  unremarkable. Kidneys are normal, without renal calculi, focal lesion, or hydronephrosis. Bladder is unremarkable. Stomach/Bowel: Moderate hiatus hernia, similar appearance to 2017 comparison. 4.1 cm duodenal diverticulum. Appendix appears normal. Moderate burden of descending colonic and proximal sigmoid diverticulosis. No evidence of bowel wall thickening, distention, or inflammatory changes. Vascular/Lymphatic: No enlarged abdominal or pelvic lymph nodes. *Occlusive proximal LEFT lower extremity DVT, as seen within the LEFT CFV, SFV and imaged portions of the popliteal vein. *Limited visualization of the LEFT external iliac vein secondary to streak artifact from LEFT hip arthroplasty, however suspected involvement of the LEFT EIV just central to the inguinal ligament. See key images. Reproductive: Prostate measures at the upper limit of normal in size, at 5.0 cm. Other: Small fat-filled RIGHT inguinal hernia versus cord lipoma. Small, broad-based fat-containing umbilical hernia. No abdominopelvic ascites. Musculoskeletal: LEFT hip arthroplasty.  No acute osseous findings. IMPRESSION: Suboptimal evaluation, secondary to streak artifact from LEFT hip arthroplasty within these constraints; 1. Occlusive, proximal LEFT lower extremity DVT involving the peripheral LEFT EIV, CFV, SFV and imaged portions of the popliteal vein. 2. Moderate hiatus hernia, mild prostatomegaly and moderate sigmoid diverticulosis. 3. 1.8 cm area of hyperattenuation within LEFT hepatic lobe, without discrete mass lesion, favor a THAD. Additional incidental, chronic and senescent findings as above. [name], MD [facility] Radiology Electronically Signed   By: [name] M.D.   On: 05/11/2021 17:46   VAS [place] LOWER EXTREMITY VENOUS (DVT)  Result Date: 05/11/2021  Lower Venous DVT Study Patient Name:  SANDEEP RADELL  Date of Exam:   05/11/2021 Medical Rec #: [id number]        Accession #:     [id number] Date of Birth: Jun 02, 1955         Patient Gender: M Patient Age:   47 years Exam Location:  [facility] Procedure:      VAS [place] LOWER EXTREMITY VENOUS (DVT) Referring Phys: DAN FLOYD --------------------------------------------------------------------------------  Indications: Swelling, and SOB.  Risk Factors: Cholecystectomy 03/2021 & recent extended travel. Comparison Study: No previous exams Performing Technologist: Jody Hill RVT, RDMS  Examination Guidelines: A complete evaluation includes B-mode imaging, spectral Doppler, color Doppler, and power Doppler as needed of all accessible portions of each vessel. Bilateral testing is considered an integral part of a complete examination. Limited examinations for reoccurring indications may be performed as noted. The reflux portion of the exam is performed with the patient in reverse Trendelenburg.  +-----+---------------+---------+-----------+----------+--------------+  RIGHT Compressibility Phasicity Spontaneity Properties Thrombus Aging  +-----+---------------+---------+-----------+----------+--------------+  CFV   Full  Yes       Yes                                    +-----+---------------+---------+-----------+----------+--------------+   +---------+---------------+---------+-----------+----------+--------------+  LEFT      Compressibility Phasicity Spontaneity Properties Thrombus Aging  +---------+---------------+---------+-----------+----------+--------------+  CFV       None            No        No                     Acute           +---------+---------------+---------+-----------+----------+--------------+  SFJ       None                                                             +---------+---------------+---------+-----------+----------+--------------+  FV Prox   None            No        No                     Acute           +---------+---------------+---------+-----------+----------+--------------+  FV Mid    None             No        No                     Acute           +---------+---------------+---------+-----------+----------+--------------+  FV Distal Partial         No        No                     Acute           +---------+---------------+---------+-----------+----------+--------------+  PFV       None            No        No                     Acute           +---------+---------------+---------+-----------+----------+--------------+  POP       None            No        No                     Acute           +---------+---------------+---------+-----------+----------+--------------+  PTV       None            No        No                     Acute           +---------+---------------+---------+-----------+----------+--------------+  PERO      None            No        No                     Acute           +---------+---------------+---------+-----------+----------+--------------+  Gastroc   Full                                                             +---------+---------------+---------+-----------+----------+--------------+  GSV       Full            Yes       Yes                    @ origin        +---------+---------------+---------+-----------+----------+--------------+    Summary: LEFT: - Findings consistent with acute deep vein thrombosis involving the left common femoral vein, SF junction, left femoral vein, left proximal profunda vein, left popliteal vein, left posterior tibial veins, and left peroneal veins. - There is no evidence of superficial venous thrombosis.  - No cystic structure found in the popliteal fossa.  *See table(s) above for measurements and observations. Electronically signed by Monica Martinez MD on 05/11/2021 at 5:13:29 PM.    Final     Disposition:        Allergies as of 05/13/2021   No Known Allergies      Medication List     STOP taking these medications    rivaroxaban 10 MG Tabs tablet Commonly known as: XARELTO       TAKE these medications    acetaminophen 500  MG tablet Commonly known as: TYLENOL Take 500 mg by mouth every 6 (six) hours as needed (for pain).   apixaban 5 MG Tabs tablet Commonly known as: ELIQUIS Take 2 tablets (10 mg total) by mouth 2 (two) times daily. $RemoveBefo'10mg'PrEzAkMFeqA$  two times day for 6  more days, Then decrease to $RemoveBef'5mg'uLrNiMLbgX$  two times daily   aspirin EC 81 MG tablet Take 162 mg by mouth daily as needed.   atorvastatin 10 MG tablet Commonly known as: LIPITOR Take 1 tablet (10 mg total) by mouth daily.   CO Q-10 PO Take 1 capsule by mouth daily.   Fish Oil 1000 MG Caps Take 1 capsule (1,000 mg total) by mouth daily. What changed: how much to take   ibuprofen 200 MG tablet Commonly known as: ADVIL Take 600 mg by mouth every 6 (six) hours as needed (for pain).   pantoprazole 40 MG tablet Commonly known as: PROTONIX Take 1 tablet (40 mg total) by mouth daily.   vitamin C 500 MG tablet Commonly known as: ASCORBIC ACID Take 500-1,000 mg by mouth daily.   Vitamin D3 50 MCG (2000 UT) Tabs Take 6,000 Units by mouth daily.   VITAMIN K2 PO Take 1 tablet by mouth daily.         Follow-up appointment   With PCP and Pulmonary   Discharge Condition:    good  Garner Nash, DO [place] Pulmonary Critical Care 05/13/2021 9:35 AM

## 2021-05-12 NOTE — Discharge Instructions (Signed)

## 2021-05-12 NOTE — Progress Notes (Signed)
ANTICOAGULATION CONSULT NOTE  ?Pharmacy Consult for heparin>> apixiban ?Indication: DVT/PE ? ?No Known Allergies ? ?Patient Measurements: ?Height: '5\' 7"'$  (170.2 cm) ?Weight: 81.9 kg (180 lb 8.9 oz) ?IBW/kg (Calculated) : 66.1 ?Heparin Dosing Weight: 81.6 kg ? ?Vital Signs: ?Temp: 97.8 ?F (36.6 ?C) (03/11 0400) ?Temp Source: Oral (03/11 0400) ?BP: 107/74 (03/11 0700) ?Pulse Rate: 66 (03/11 0700) ? ?Labs: ?Recent Labs  ?  05/11/21 ?1052 05/11/21 ?1102 05/11/21 ?1237 05/11/21 ?2332  ?HGB 14.2 15.3  --   --   ?HCT 44.4 45.0  --   --   ?PLT 157  --   --   --   ?HEPARINUNFRC  --   --   --  0.79*  ?CREATININE 1.14 1.20  --  1.41*  ?TROPONINIHS 562*  --  838*  --   ? ? ? ?Estimated Creatinine Clearance: 53.5 mL/min (A) (by C-G formula based on SCr of 1.41 mg/dL (H)). ? ? ?Assessment: ?66 y.o. male with DVT/PE s/p thrombectomy on heparin. Stable no issues with bleeding per RN.  ? ?Goal of Therapy:  ?Heparin level 0.3-0.7 units/ml ?Monitor platelets by anticoagulation protocol: Yes ?  ?Plan:  ?Stop heparin at 1000 with first dose apixiban  ?Start apixiban 10 mg BID x 7 days then 5 mg BID ?30 d free card provided to patient, patient counseled   ?Consult to case management for cost ? ? ?Eduard Clos Zyanne Schumm ?05/12/2021,7:54 AM ? ? ?

## 2021-05-12 NOTE — Progress Notes (Signed)
NAME:  Juan Snow, MRN:  [id number], DOB:  1955/03/13, LOS: 1 ADMISSION DATE:  05/11/2021 CONSULTATION DATE:  05/11/2021 REFERRING MD:  Tyrone Nine - EDP CHIEF COMPLAINT:  Acute pulmonary embolus   History of Present Illness:  66 year old man who presented to [facility] ED 3/10 for worsening shortness of breath.  PMHx significant for osteoarthritis and GERD.  Recently underwent laparoscopic cholecystectomy with Dr. Harlow Asa 03/30/2021.  Per patient/wife at bedside, patient had reported intermittent SOB since surgery 1/27 with intermittent swelling of his left lower extremity.  They recently took a trip to [place] to celebrate patient's retirement and returned 3/5.  Since then, he has had increasing SOB prompting him to present to the ED today.  Endorses mild CP/SOB, dyspnea, swelling of left lower extremity.  Denies fever/chills, N/V, abdominal pain, dizziness, headache.  Endorses recent surgery and recent travel as above.  Denies tobacco use or exogenous hormone use such as testosterone.  No history of cancer. Denies known family history or personal history of VTE.  On ED arrival, patient was tachycardic to 110s, tachypneic to 20s with O2 sat 97% and normotensive.  CXR demonstrated new left lateral basilar opacities. CTA Chest was positive for acute PE with evidence of R heart strain (RV/LV ratio 1.75).  Heparin gtt was initiated. Interventional radiology was consulted.  PCCM consulted for admission with plan for transfer to [facility] for intervention.  Pertinent Medical History:   Past Medical History:  Diagnosis Date   Arthritis    OA   Cough    WHITE SPUTUM OCC, NO FEVER COLD SYMPTOMS MAINLY   GERD (gastroesophageal reflux disease)    Significant Hospital Events: Including procedures, antibiotic start and stop dates in addition to other pertinent events   3/10 - Presented to [facility] ED with persistent/worsening SOB.  Found to have acute saddle PE with evidence of right heart strain.  IR consulted.  PCCM  consulted for admission. Echo obtained, heparin gtt started.  Interim History / Subjective:   Comfortable resting in bed.   Objective:  Blood pressure 115/77, pulse 78, temperature 97.9 F (36.6 C), temperature source Oral, resp. rate (!) 30, height '5\' 7"'$  (1.702 m), weight 81.9 kg, SpO2 95 %.        Intake/Output Summary (Last 24 hours) at 05/12/2021 0918 Last data filed at 05/12/2021 0700 Gross per 24 hour  Intake 675.55 ml  Output 700 ml  Net -24.45 ml   Filed Weights   05/11/21 1031 05/11/21 1600 05/12/21 0600  Weight: 81.6 kg 84.4 kg 81.9 kg   Physical Examination: General appearance: 66 y.o., male, NAD, conversant  Eyes: anicteric sclerae, moist conjunctivae; no lid-lag; PERRLA, tracking appropriately HENT: NCAT; oropharynx, MMM, no mucosal ulcerations; normal hard and soft palate Neck: Trachea midline; FROM, supple, lymphadenopathy, no JVD Lungs: CTAB, no crackles, no wheeze, with normal respiratory effort and no intercostal retractions CV: RRR, S1, S2, no MRGs  Abdomen: Soft, non-tender; non-distended, BS present  Extremities: No peripheral edema, radial and DP pulses present bilaterally  Skin: Normal temperature, turgor and texture; no rash Psych: Appropriate affect Neuro: Alert and oriented to person and place, no focal deficit    Resolved Hospital Problem List:    Assessment & Plan:   Acute submassive pulmonary embolus with high risk features Admitted with SOB. CTA demonstrating acute submassive) PE with evidence of R heart strain (RV/LV Ratio 1.75). Management options including conservative management, lytic administration and thrombectomy discussed. Given high risk features of PE, decision for transfer to [facility] for IR  thrombectomy. PESI: 115, Class IV, High Risk (4-11.4% 30-day mortality) Bova Score: 5, Stage III, High Risk (42% risk of PE-related complications at 30 days, 10% risk of PE-related mortality)  Patient tolerated interventional radiology mechanical  thrombectomy well. He does have clot in his lower extremity on Doppler. Currently stable on heparin infusion. PT OT evaluation. Switch to Eliquis twice daily. Wean from oxygen.  Goal O2 sats above 90%. Hopeful for discharge tomorrow.  Best Practice: (right click and "Reselect all SmartList Selections" daily)   Diet/type: NPO DVT prophylaxis: systemic heparin GI prophylaxis: PPI Lines: N/A Foley:  N/A Code Status:  full code Last date of multidisciplinary goals of care discussion [Pending]  Labs:  CBC: Recent Labs  Lab 05/11/21 1052 05/11/21 1102  WBC 17.4*  --   NEUTROABS 14.9*  --   HGB 14.2 15.3  HCT 44.4 45.0  MCV 90.1  --   PLT 157  --    Basic Metabolic Panel: Recent Labs  Lab 05/11/21 1052 05/11/21 1102 05/11/21 2332  NA 136 138 137  K 3.8 4.2 4.4  CL 101 101 102  CO2 24  --  24  GLUCOSE 148* 149* 141*  BUN '20 22 16  '$ CREATININE 1.14 1.20 1.41*  CALCIUM 9.3  --  8.9  MG  --   --  1.9   GFR: Estimated Creatinine Clearance: 53.5 mL/min (A) (by C-G formula based on SCr of 1.41 mg/dL (H)). Recent Labs  Lab 05/11/21 1052  WBC 17.4*   Liver Function Tests: Recent Labs  Lab 05/11/21 1052 05/11/21 2332  AST 34 27  ALT 74* 56*  ALKPHOS 88 69  BILITOT 0.4 0.6  PROT 7.4 5.9*  ALBUMIN 3.5 2.8*   No results for input(s): LIPASE, AMYLASE in the last 168 hours. No results for input(s): AMMONIA in the last 168 hours.  ABG:    Component Value Date/Time   HCO3 25.1 03/27/2021 2311   TCO2 27 05/11/2021 1102   O2SAT 92.7 03/27/2021 2311    Coagulation Profile: No results for input(s): INR, PROTIME in the last 168 hours.  Cardiac Enzymes: No results for input(s): CKTOTAL, CKMB, CKMBINDEX, TROPONINI in the last 168 hours.  HbA1C: No results found for: HGBA1C  CBG: Recent Labs  Lab 05/11/21 2057  GLUCAP 99    Review of Systems:   Review of systems completed with pertinent positives/negatives outlined in above HPI.  Past Medical History:   He,  has a past medical history of Arthritis, Cough, and GERD (gastroesophageal reflux disease).   Surgical History:   Past Surgical History:  Procedure Laterality Date   CHOLECYSTECTOMY N/A 03/30/2021   Procedure: LAPAROSCOPIC CHOLECYSTECTOMY WITH INTRAOPERATIVE CHOLANGIOGRAM;  Surgeon: Armandina Gemma, MD;  Location: [facility] ORS;  Service: General;  Laterality: N/A;   COLONOSCOPY  06/17/2011   Procedure: COLONOSCOPY;  Surgeon: [name], MD;  Location: [facility] ENDOSCOPY;  Service: Endoscopy;  Laterality: N/A;   KNEE SURGERY Right YRS AGO   MENISCUS   THUMB ARTHROSCOPY Right YRS AGO   TOTAL HIP ARTHROPLASTY Left 06/25/2017   Procedure: LEFT TOTAL HIP ARTHROPLASTY ANTERIOR APPROACH;  Surgeon: Gaynelle Arabian, MD;  Location: [facility] ORS;  Service: Orthopedics;  Laterality: Left;    Social History:   reports that he has never smoked. He has never used smokeless tobacco. He reports that he does not drink alcohol and does not use drugs.   Family History:  His family history includes CAD in his brother; Hypertension in an other family member. There is no history  of Diabetes.   Allergies: No Known Allergies   Home Medications: Prior to Admission medications   Medication Sig Start Date End Date Taking? Authorizing Provider  acetaminophen (TYLENOL) 500 MG tablet Take 500 mg by mouth every 6 (six) hours as needed (for pain).    [provider]  amoxicillin-clavulanate (AUGMENTIN) 875-125 MG tablet Take 1 tablet by mouth every 12 hours 03/31/21   [name], MD  atorvastatin (LIPITOR) 10 MG tablet Take 1 tablet (10 mg total) by mouth daily. 01/11/21     Cholecalciferol (VITAMIN D3) 50 MCG (2000 UT) TABS Take 8,000 Units by mouth daily.    [provider]  Coenzyme Q10 (CO Q-10 PO) Take 1 capsule by mouth daily.    [provider]  ibuprofen (ADVIL) 200 MG tablet Take 600 mg by mouth every 6 (six) hours as needed (for pain).    [provider]  Menaquinone-7 (VITAMIN  K2 PO) Take 1 tablet by mouth daily.    [provider]  pantoprazole (PROTONIX) 40 MG tablet Take 1 tablet (40 mg total) by mouth daily. 03/31/21   [name], MD  rivaroxaban (XARELTO) 10 MG TABS tablet Take 1 tablet (10 mg total) by mouth daily with breakfast. Patient not taking: Reported on 03/27/2021 06/26/17   [place], [employer], PA-C  vitamin C (ASCORBIC ACID) 500 MG tablet Take 500-1,000 mg by mouth daily.    [provider]     Garner Nash, DO [place] [facility] 05/12/2021 9:18 AM

## 2021-05-12 NOTE — Progress Notes (Signed)
ANTICOAGULATION CONSULT NOTE  ?Pharmacy Consult for heparin ?Indication: DVT/PE ?Brief A/P: Heparin level supratherapeutic  Decrease Heparin rate ? ?No Known Allergies ? ?Patient Measurements: ?Height: '5\' 7"'$  (170.2 cm) ?Weight: 84.4 kg (186 lb 1.1 oz) ?IBW/kg (Calculated) : 66.1 ?Heparin Dosing Weight: 81.6 kg ? ?Vital Signs: ?Temp: 98.4 ?F (36.9 ?C) (03/10 2058) ?Temp Source: Axillary (03/10 2058) ?BP: 116/78 (03/11 0000) ?Pulse Rate: 68 (03/11 0000) ? ?Labs: ?Recent Labs  ?  05/11/21 ?1052 05/11/21 ?1102 05/11/21 ?1237 05/11/21 ?2332  ?HGB 14.2 15.3  --   --   ?HCT 44.4 45.0  --   --   ?PLT 157  --   --   --   ?HEPARINUNFRC  --   --   --  0.79*  ?CREATININE 1.14 1.20  --  1.41*  ?TROPONINIHS 562*  --  838*  --   ? ? ? ?Estimated Creatinine Clearance: 54.2 mL/min (A) (by C-G formula based on SCr of 1.41 mg/dL (H)). ? ? ?Assessment: ?66 y.o. male with DVT/PE s/p thrombectomy for heparin ? ?Goal of Therapy:  ?Heparin level 0.3-0.7 units/ml ?Monitor platelets by anticoagulation protocol: Yes ?  ?Plan:  ?Decrease Heparin 1250 units/hr ?Follow-up am labs.  ? ?Juan Snow, Juan Snow ?05/12/2021,12:08 AM ? ? ?

## 2021-05-12 NOTE — Evaluation (Signed)
Physical Therapy Evaluation ?Patient Details ?Name: Juan Snow ?MRN: [id number] ?DOB: 07/10/1955 ?Today's Date: 05/12/2021 ? ?History of Present Illness ? pt is a 66 y/o male admitted 3/10 with worsening SOB, Found to be due to submaximal Saddle PE on imaging.  PMHx, OA and GERD  ?Clinical Impression ? Pt is at or close to baseline functioning and should be safe at home with initial assist from his wife. There are no further acute PT needs.  Will sign off at this time. ?   ?   ? ?Recommendations for follow up therapy are one component of a multi-disciplinary discharge planning process, led by the attending physician.  Recommendations may be updated based on patient status, additional functional criteria and insurance authorization. ? ?Follow Up Recommendations No PT follow up ? ?  ?Assistance Recommended at Discharge Intermittent Supervision/Assistance  ?Patient can return home with the following ? A little help with bathing/dressing/bathroom;Assistance with cooking/housework;Assist for transportation ? ?  ?Equipment Recommendations None recommended by PT  ?Recommendations for Other Services ?    ?  ?Functional Status Assessment Patient has had a recent decline in their functional status and demonstrates the ability to make significant improvements in function in a reasonable and predictable amount of time.  ? ?  ?Precautions / Restrictions Precautions ?Precautions: None  ? ?  ? ?Mobility ? Bed Mobility ?Overal bed mobility: Modified Independent ?  ?  ?  ?  ?  ?  ?  ?  ? ?Transfers ?Overall transfer level: Modified independent ?  ?  ?  ?  ?  ?  ?  ?  ?  ?  ? ?Ambulation/Gait ?Ambulation/Gait assistance: Supervision ?Gait Distance (Feet): 60 Feet ?Assistive device: None ?Gait Pattern/deviations: Step-through pattern ?  ?Gait velocity interpretation: 1.31 - 2.62 ft/sec, indicative of limited community ambulator ?  ?General Gait Details: generally steady overall, but did not attain age appropriate gait speeds or  challenge his balance. ? ?Stairs ?Stairs:  (pt deferred) ?  ?  ?  ?  ? ?Wheelchair Mobility ?  ? ?Modified Rankin (Stroke Patients Only) ?  ? ?  ? ?Balance Overall balance assessment: No apparent balance deficits (not formally assessed) ?  ?  ?  ?  ?  ?  ?  ?  ?  ?  ?  ?  ?  ?  ?  ?  ?  ?  ?   ? ? ? ?Pertinent Vitals/Pain Pain Assessment ?Pain Assessment: Faces ?Faces Pain Scale: No hurt ?Pain Intervention(s): Monitored during session  ? ? ?Home Living Family/patient expects to be discharged to:: Private residence ?Living Arrangements: Spouse/significant other ?Available Help at Discharge: Available 24 hours/day (for a short period) ?Type of Home: House ?Home Access: Stairs to enter ?Entrance Stairs-Rails: Right;Left ?Entrance Stairs-Number of Steps: several ?Alternate Level Stairs-Number of Steps: flight ?Home Layout: Two level;Bed/bath upstairs ?  ?   ?  ?Prior Function Prior Level of Function : Independent/Modified Independent;Other (comment) (with ADL's and iADL's) ?  ?  ?  ?  ?  ?  ?  ?  ?  ? ? ?Hand Dominance  ?   ? ?  ?Extremity/Trunk Assessment  ? Upper Extremity Assessment ?Upper Extremity Assessment: Overall WFL for tasks assessed ?  ? ?Lower Extremity Assessment ?Lower Extremity Assessment: Overall WFL for tasks assessed ?  ? ?Cervical / Trunk Assessment ?Cervical / Trunk Assessment: Normal  ?Communication  ? Communication: No difficulties  ?Cognition Arousal/Alertness: Awake/alert ?Behavior During Therapy: [facility] for tasks assessed/performed ?Overall Cognitive  Status: Within Functional Limits for tasks assessed ?  ?  ?  ?  ?  ?  ?  ?  ?  ?  ?  ?  ?  ?  ?  ?  ?  ?  ?  ? ?  ?General Comments General comments (skin integrity, edema, etc.): VSS ? ?  ?Exercises    ? ?Assessment/Plan  ?  ?PT Assessment Patient does not need any further PT services  ?PT Problem List   ? ?   ?  ?PT Treatment Interventions     ? ?PT Goals (Current goals can be found in the Care Plan section)  ?Acute Rehab PT Goals ?Patient Stated  Goal: home ?PT Goal Formulation: All assessment and education complete, DC therapy ? ?  ?Frequency   ?  ? ? ?Co-evaluation   ?  ?  ?  ?  ? ? ?  ?AM-PAC PT "6 Clicks" Mobility  ?Outcome Measure Help needed turning from your back to your side while in a flat bed without using bedrails?: None ?Help needed moving from lying on your back to sitting on the side of a flat bed without using bedrails?: None ?Help needed moving to and from a bed to a chair (including a wheelchair)?: None ?Help needed standing up from a chair using your arms (e.g., wheelchair or bedside chair)?: None ?Help needed to walk in hospital room?: None ?Help needed climbing 3-5 steps with a railing? : A Little ?6 Click Score: 23 ? ?  ?End of Session   ?Activity Tolerance: Patient tolerated treatment well ?Patient left: in bed;with call bell/phone within reach;with family/visitor present ?Nurse Communication: Mobility status ?PT Visit Diagnosis: Other abnormalities of gait and mobility (R26.89) ?  ? ?Time: 4132-4401 ?PT Time Calculation (min) (ACUTE ONLY): 15 min ? ? ?Charges:   PT Evaluation ?$PT Eval Low Complexity: 1 Low ?  ?  ?   ? ? ?05/12/2021 ? ?Ginger Carne., PT ?Acute Rehabilitation Services ?[phone]  (pager) ?[phone]  (office) ? ?Tessie Fass Romie Tay ?05/12/2021, 1:58 PM ? ?

## 2021-05-12 NOTE — Progress Notes (Signed)
[facility] ADULT ICU REPLACEMENT PROTOCOL ? ? ?The patient does apply for the [facility] Adult ICU Electrolyte Replacment Protocol based on the criteria listed below:  ? ?1.Exclusion criteria: TCTS patients, ECMO patients, and Dialysis patients ?2. Is GFR >/= 30 ml/min? Yes.    ?Patient's GFR today is 55 ?3. Is SCr </= 2? Yes.   ?Patient's SCr is 1.41 mg/dL ?4. Did SCr increase >/= 0.5 in 24 hours? No. ?5.Pt's weight >40kg  Yes.   ?6. Abnormal electrolyte(s): mag 1.9  ?7. Electrolytes replaced per protocol ?8.  Call MD STAT for K+ </= 2.5, Phos </= 1, or Mag </= 1 ?Physician:  n/a ? ?Darlys Gales 05/12/2021 2:58 AM ? ?

## 2021-05-13 ENCOUNTER — Telehealth: Payer: Self-pay | Admitting: Pulmonary Disease

## 2021-05-13 ENCOUNTER — Other Ambulatory Visit (HOSPITAL_COMMUNITY): Payer: Self-pay

## 2021-05-13 LAB — CBC
HCT: 33.9 % — ABNORMAL LOW (ref 39.0–52.0)
Hemoglobin: 11.1 g/dL — ABNORMAL LOW (ref 13.0–17.0)
MCH: 29 pg (ref 26.0–34.0)
MCHC: 32.7 g/dL (ref 30.0–36.0)
MCV: 88.5 fL (ref 80.0–100.0)
Platelets: 134 10*3/uL — ABNORMAL LOW (ref 150–400)
RBC: 3.83 MIL/uL — ABNORMAL LOW (ref 4.22–5.81)
RDW: 13.8 % (ref 11.5–15.5)
WBC: 12.2 10*3/uL — ABNORMAL HIGH (ref 4.0–10.5)
nRBC: 0 % (ref 0.0–0.2)

## 2021-05-13 LAB — BASIC METABOLIC PANEL
Anion gap: 12 (ref 5–15)
BUN: 14 mg/dL (ref 8–23)
CO2: 23 mmol/L (ref 22–32)
Calcium: 8.6 mg/dL — ABNORMAL LOW (ref 8.9–10.3)
Chloride: 102 mmol/L (ref 98–111)
Creatinine, Ser: 1.36 mg/dL — ABNORMAL HIGH (ref 0.61–1.24)
GFR, Estimated: 58 mL/min — ABNORMAL LOW (ref >60)
Glucose, Bld: 106 mg/dL — ABNORMAL HIGH (ref 70–99)
Potassium: 4 mmol/L (ref 3.5–5.1)
Sodium: 137 mmol/L (ref 135–145)

## 2021-05-13 LAB — MAGNESIUM: Magnesium: 1.9 mg/dL (ref 1.7–2.4)

## 2021-05-13 MED ORDER — APIXABAN 5 MG PO TABS: 10.0000 mg | ORAL_TABLET | Freq: Two times a day (BID) | ORAL | 0 refills | Status: DC

## 2021-05-13 MED ORDER — APIXABAN 5 MG PO TABS
10.0000 mg | ORAL_TABLET | Freq: Two times a day (BID) | ORAL | 0 refills | Status: DC
  Filled 2021-05-13: qty 60, 15d supply, fill #0

## 2021-05-13 MED ORDER — FISH OIL 1000 MG PO CAPS: 1000.0000 mg | ORAL_CAPSULE | Freq: Every day | ORAL | 0 refills | Status: DC

## 2021-05-13 NOTE — Telephone Encounter (Signed)
PCCM: ? ?Please make appointment with Dr. Silas Flood next available 30-minute appointment slot. ? ?Diagnosis submassive pulmonary embolism. ? ?Garner Nash, DO ?[place] [facility] ?05/13/2021 9:40 AM   ? ?

## 2021-05-13 NOTE — TOC Transition Note (Signed)
Transition of Care (TOC) - CM/SW Discharge Note ? ? ?Patient Details  ?Name: RANARD HARTE ?MRN: [id number] ?Date of Birth: 1955/06/26 ? ?Transition of Care (TOC) CM/SW Contact:  ?Carles Collet, RN ?Phone Number: ?05/13/2021, 10:03 AM ? ? ?Clinical Narrative:    ?Eliquis card provided for initial Rx fill. Wife will check on refill price with pharmacist. ?No other TOC needs identified.  ? ? ? ?  ?  ? ? ?Patient Goals and CMS Choice ?  ?  ?  ? ?Discharge Placement ?  ?           ?  ?  ?  ?  ? ?Discharge Plan and Services ?  ?  ?           ?  ?  ?  ?  ?  ?  ?  ?  ?  ?  ? ?Social Determinants of Health (SDOH) Interventions ?  ? ? ?Readmission Risk Interventions ?No flowsheet data found. ? ? ? ? ?

## 2021-05-13 NOTE — Plan of Care (Signed)
Adequate for discharge. All questions answered to patients satisfaction.  ?

## 2021-05-14 ENCOUNTER — Other Ambulatory Visit (HOSPITAL_COMMUNITY): Payer: Self-pay

## 2021-05-15 NOTE — Telephone Encounter (Signed)
Patient scheduled 06/04/2021 at 9:30am with Dr. Silas Flood- nothing further needed ?

## 2021-05-17 ENCOUNTER — Other Ambulatory Visit: Payer: Self-pay | Admitting: Interventional Radiology

## 2021-06-04 ENCOUNTER — Ambulatory Visit (INDEPENDENT_AMBULATORY_CARE_PROVIDER_SITE_OTHER): Payer: Medicare Other | Admitting: Pulmonary Disease

## 2021-06-04 ENCOUNTER — Encounter: Payer: Self-pay | Admitting: Pulmonary Disease

## 2021-06-04 VITALS — BP 118/74 | HR 60 | Temp 98.2°F | Ht 67.0 in | Wt 177.0 lb

## 2021-06-04 DIAGNOSIS — I2602 Saddle embolus of pulmonary artery with acute cor pulmonale: Secondary | ICD-10-CM | POA: Diagnosis not present

## 2021-06-04 NOTE — Progress Notes (Signed)
? ?'@Patient'$  ID: Juan Snow, male    DOB: 1955-11-02, 66 y.o.   MRN: [id number] ? ?Chief Complaint  ?Patient presents with  ? Hospitalization Follow-up  ?  Hospital follow up for a submassive PE. Pt is taking Eliquis '10mg'$  two times a day. No issues noted from patient   ? ? ?Referring provider: ?Vernie Shanks, MD ? ?HPI:  ? ?66 y.o. man whom we are seeing in hospital follow-up for submassive PE.  H&P reviewed.  Discharge summary reviewed. ? 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  Reports he had cholecystitis shortly thereafter and had cholecystectomy.  Then traveled to [place] early March 2023.  Returns with symptoms of lower extremity swelling.  He was noted to be tachycardic and tachypneic although saturating well on room air.  CTA PE protocol of the chest on my review interpretation revealed significant clot burden with clear lungs bilaterally.  He started on heparin.  Subsequently IR was consulted.  Went for mechanical thrombectomy.  Was transition to Eliquis at time of discharge.  Overall his symptoms are improving.  Dyspnea improving.  Feels like his breathing is fine but needs work on his stamina.  Graduated exercise program he is undergoing at the discretion of his PCP.  We discussed this.  Agreed with the plan.  He is gradually improving.  Denies any chest pain.  No other concerns. ? ?PMH: Hyperlipidemia, GERD ?Surgical history: Cholecystectomy, knee surgery, hip surgery, mechanical thrombectomy for acute submassive PE ?Family history: Brother with CAD ?Social history: Works at [employer] for 35 years, retired 2023, never smoker ?Family History  ?Problem Relation Age of Onset  ? CAD Brother   ? Hypertension Other   ? Diabetes Neg Hx   ? ? ?Past Surgical History:  ?Procedure Laterality Date  ? CHOLECYSTECTOMY N/A 03/30/2021  ? Procedure: LAPAROSCOPIC CHOLECYSTECTOMY WITH INTRAOPERATIVE CHOLANGIOGRAM;  Surgeon: Armandina Gemma, MD;  Location: [facility];  Service: General;  Laterality: N/A;  ? COLONOSCOPY  06/17/2011  ? Procedure: COLONOSCOPY;  Surgeon: [name], MD;  Location: [facility];  Service: Endoscopy;  Laterality: N/A;  ? IR ANGIOGRAM PULMONARY BILATERAL SELECTIVE  05/11/2021  ? IR ANGIOGRAM SELECTIVE EACH ADDITIONAL VESSEL  05/11/2021  ? IR ANGIOGRAM SELECTIVE EACH ADDITIONAL VESSEL  05/11/2021  ? IR THROMBECT VENO MECH MOD SED  05/11/2021  ? IR US GUIDE VASC ACCESS RIGHT  05/11/2021  ? KNEE SURGERY Right YRS AGO  ? MENISCUS  ? THUMB ARTHROSCOPY Right YRS AGO  ? TOTAL HIP ARTHROPLASTY Left 06/25/2017  ? Procedure: LEFT TOTAL HIP ARTHROPLASTY ANTERIOR APPROACH;  Surgeon: Gaynelle Arabian, MD;  Location: [facility];  Service: Orthopedics;  Laterality: Left;  ? ? ? ? ? ?Questionaires / Pulmonary Flowsheets:  ? ?ACT:  ?   ? View : No data to display.  ?  ?  ?  ? ? ?MMRC: ?   ? View : No data to display.  ?  ?  ?  ? ? ?Epworth:  ?   ? View : No data to display.  ?  ?  ?  ? ? ?Tests:  ? ?FENO:  ?No results found for: NITRICOXIDE ? ?PFT: ?   ? View : No data to display.  ?  ?  ?  ? ? ?WALK:  ?   ? View : No data to display.  ?  ?  ?  ? ? ?Imaging: ?Personally reviewed and as per EMR discussion this  note ?CT Angio Chest PE W and/or Wo Contrast ? ?Result Date: 05/11/2021 ?CLINICAL DATA:  Shortness of breath and left leg swelling. Gallbladder surgery 5 weeks ago. EXAM: CT ANGIOGRAPHY CHEST WITH CONTRAST TECHNIQUE: Multidetector CT imaging of the chest was performed using the standard protocol during bolus administration of intravenous contrast. Multiplanar CT image reconstructions and MIPs were obtained to evaluate the vascular anatomy. RADIATION DOSE REDUCTION: This exam was performed according to the departmental dose-optimization program which includes automated exposure control, adjustment of the mA and/or kV according to patient size and/or use of iterative reconstruction technique. CONTRAST:  8m OMNIPAQUE IOHEXOL 350 MG/ML SOLN COMPARISON:  None. FINDINGS:  Cardiovascular: The heart is normal in size. No pericardial effusion. The aorta is normal in caliber. No dissection. Minimal scattered atherosclerotic calcifications. The pulmonary arterial tree is fairly well opacified. There is submassive bilateral pulmonary emboli. Associated significant right heart strain with RV LV ratio of much greater than 1. The left ventricle is flattened and there is some contrast coursing down the IVC and into the hepatic veins. Mediastinum/Nodes: No mediastinal or hilar mass or adenopathy. The esophagus is grossly normal. There is a moderate to large hiatal hernia noted. Lungs/Pleura: Small left pleural effusion and overlying atelectasis. Peripheral rounded density in the left lower lobe could be atelectasis or pulmonary infarct. Similar findings the peripheral aspect of the right upper lobe. Right apical cavitary process measuring a maximum of 5 cm. This could represent chronic post pneumonic scarring changes but could not exclude neoplasm. Recommend pulmonary consultation and possible PET scan. Upper Abdomen: No significant upper abdominal findings. The gallbladder is surgically absent. Musculoskeletal: No chest wall mass, supraclavicular axillary adenopathy. The bony thorax is intact. Review of the MIP images confirms the above findings. IMPRESSION: 1. Positive for acute PE with CT evidence of right heart strain (RV/LV Ratio = 1.75) consistent with at least submassive (intermediate risk) PE. The presence of right heart strain has been associated with an increased risk of morbidity and mortality. Please refer to the "PE Focused" order set in EPIC. 2. Small left pleural effusion and overlying atelectasis. 3. Peripheral rounded density in the left lower lobe and right upper lobe could be atelectasis or pulmonary infarct. 4. 5 cm cavitary process in the right lung apex. This could represent chronic post pneumonic scarring changes but could not exclude neoplasm. Recommend pulmonary  consultation and possible PET scan. 5. Moderate to large hiatal hernia. 6. Aortic atherosclerosis. These results were called by telephone at the time of interpretation on 05/11/2021 at 12:40 pm to provider DAN FLOYD , who verbally acknowledged these results. Aortic Atherosclerosis (ICD10-I70.0). Electronically Signed   By: PMarijo SanesM.D.   On: 05/11/2021 12:40  ? ?IR Angiogram Pulmonary Bilateral Selective ? ?Result Date: 05/12/2021 ?INDICATION: Briefly, 66year old male with recent cholecystectomy in 03/30/2021 and airline travel a week ago, presenting with large LEFT lower extremity DVT and large burden PE (RV/LV ratio 1.8). EXAM: Procedures: 1. ULTRASOUND GUIDANCE FOR VENOUS ACCESS2. PULMONARY ARTERIOGRAPHY3. FLUOROSCOPIC GUIDED CATHETER DIRECTED BILATERAL PULMONARY THROMBECTOMY Performing Physician: JMichaelle Birks MD Assistant(s): AMarkus Daft MD A qualified trainee/resident or advanced practice provider (APP) was not immediately available to assist with this case. COMPARISON:  CTA PE, CTV AP and chest XR, earlier same day. MEDICATIONS: 3000 IU heparin IV ANESTHESIA/SEDATION: Moderate (conscious) sedation was employed during this procedure. A total of Versed 3.5 mg and Fentanyl 150 mcg was administered intravenously. Moderate Sedation Time: 108 minutes. The patient's level of consciousness and vital signs were monitored continuously  by radiology nursing throughout the procedure under my direct supervision. CONTRAST:  100 mL Omnipaque 300 FLUOROSCOPY TIME:  28 minutes 36 seconds (859 mGy) COMPLICATIONS: None immediate. TECHNIQUE: Informed written consent was obtained from the the patient and/or patient's representative after a discussion of the risks, benefits and alternatives to treatment. Questions regarding the procedure were encouraged and answered. A timeout was performed prior to the initiation of the procedure. Ultrasound scanning was performed of the right groin and demonstrated wide patency of the right  common femoral vein. The right groin was prepped and draped in the usual sterile fashion, and a sterile drape was applied covering the operative field. Maximum barrier sterile technique with sterile gowns and gloves w

## 2021-06-04 NOTE — Patient Instructions (Addendum)
Nice to see you today! ? ?Continue the Eliquis 5 mg twice a day ? ?We will repeat a heart ultrasound or echocardiogram ion June then meet after to discuss next steps ? ?See you back in 2 or so months ?

## 2021-06-06 ENCOUNTER — Ambulatory Visit
Admission: RE | Admit: 2021-06-06 | Discharge: 2021-06-06 | Disposition: A | Discharge status: Home / Self Care | Payer: Medicare Other | Source: Ambulatory Visit | Attending: Physician Assistant | Admitting: Physician Assistant

## 2021-06-06 ENCOUNTER — Encounter: Payer: Self-pay | Admitting: *Deleted

## 2021-06-06 ENCOUNTER — Ambulatory Visit
Admission: RE | Admit: 2021-06-06 | Discharge: 2021-06-06 | Disposition: A | Discharge status: Home / Self Care | Payer: Medicare Other | Source: Ambulatory Visit | Attending: Interventional Radiology | Admitting: Interventional Radiology

## 2021-06-06 DIAGNOSIS — I2601 Septic pulmonary embolism with acute cor pulmonale: Secondary | ICD-10-CM

## 2021-06-06 HISTORY — PX: IR RADIOLOGIST EVAL & MGMT: IMG5224

## 2021-06-06 IMAGING — US US EXTREM LOW VENOUS*L*
1 series · 14 of 24 positions shown · non-contrast
Comparison: CT venogram, [DATE]. LEFT lower extremity venous
duplex report, [DATE].

CLINICAL DATA: History of acute PE and LEFT lower extremity DVT.
One month follow-up.

EXAM:
LEFT LOWER EXTREMITY VENOUS DOPPLER ULTRASOUND
TECHNIQUE: Gray-scale sonography with compression, as well as color and duplex
ultrasound, were performed to evaluate the deep venous system(s)
from the level of the common femoral vein through the popliteal and
proximal calf veins.

[Series 1: us extrem low venous*left* · 0.06mm/px · 14 of 29 slices shown]
[im 1/29]
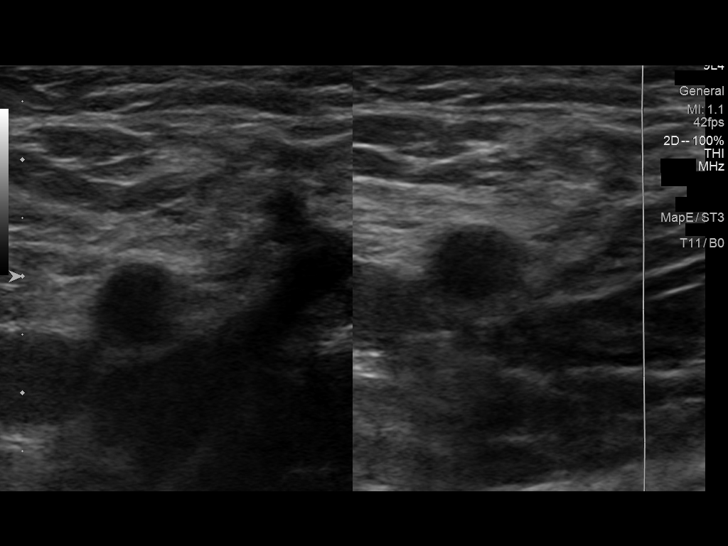
[im 3/29]
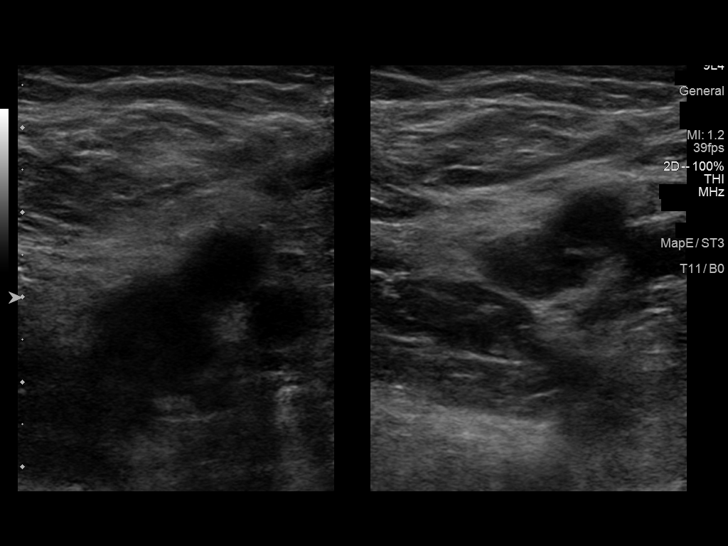
[im 5/29]
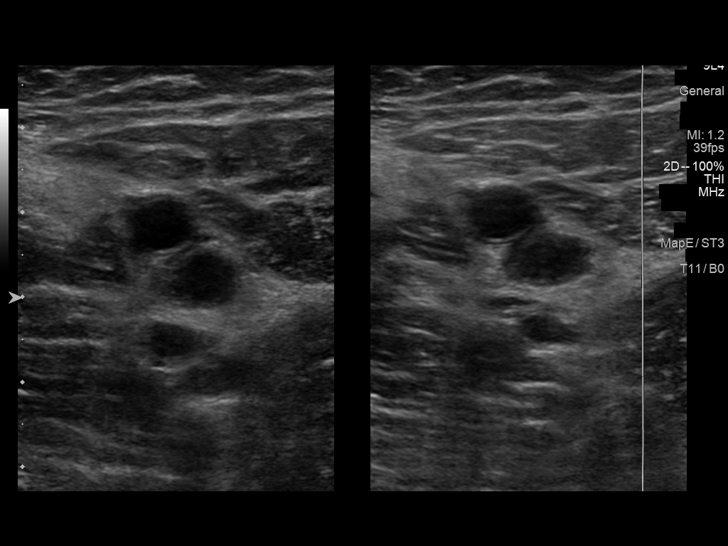
[im 8/29]
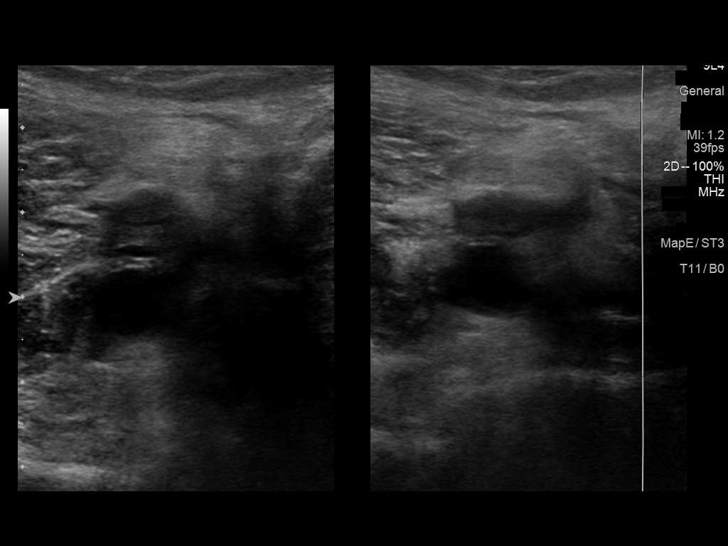
[im 9/29]
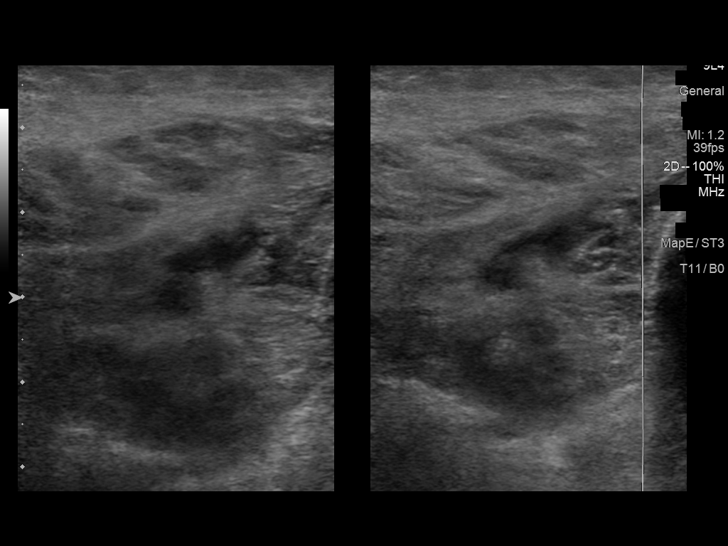
[im 11/29]
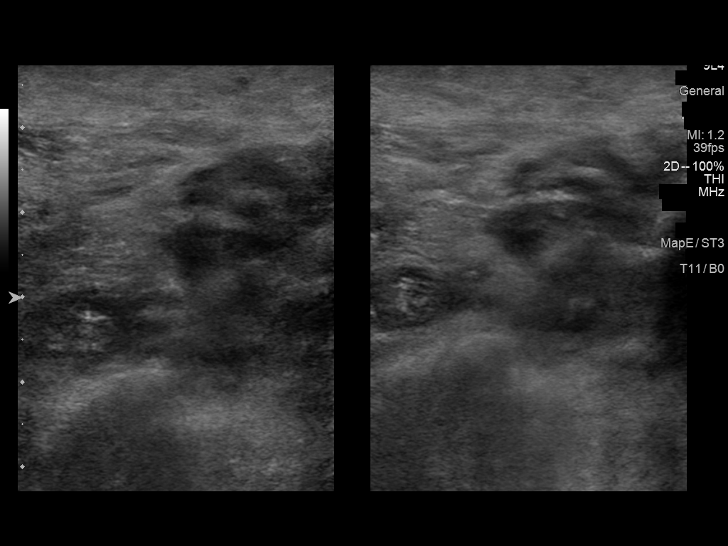
[im 14/29]
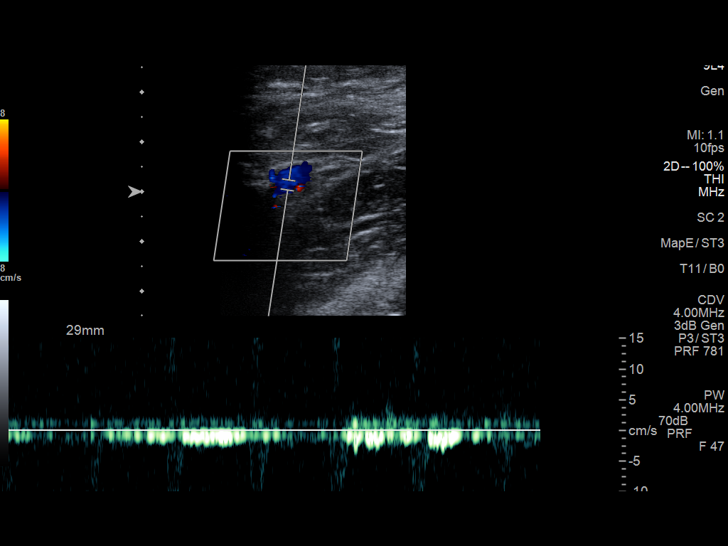
[im 15/29]
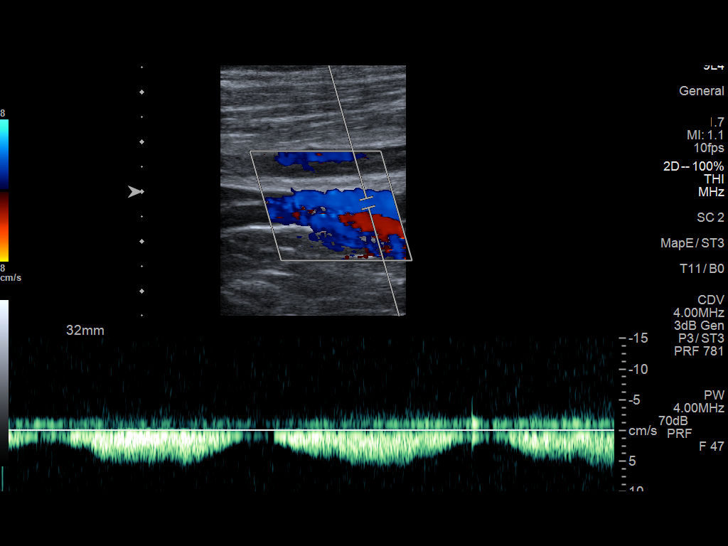
[im 18/29]
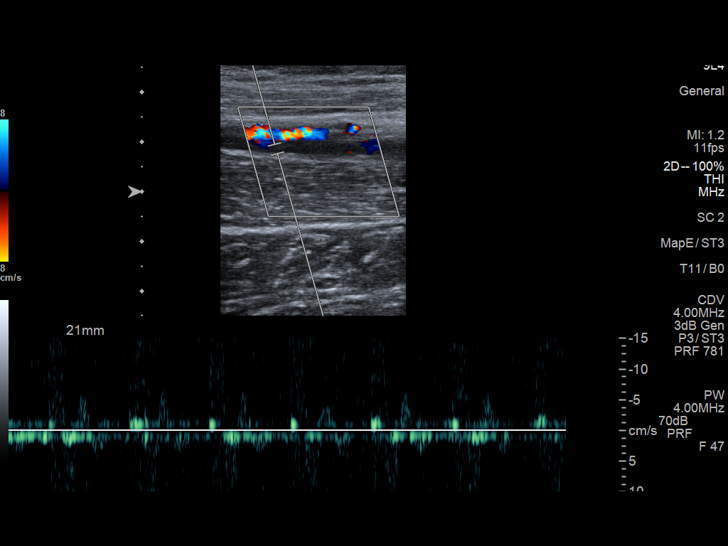
[im 20/29]
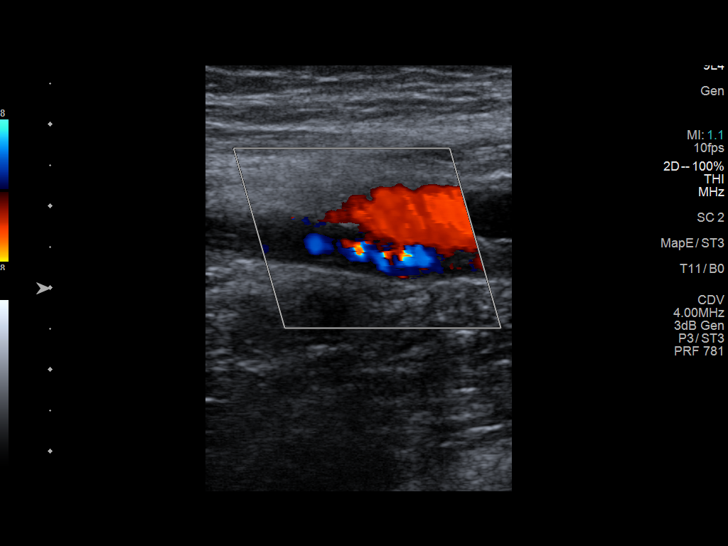
[im 22/29]
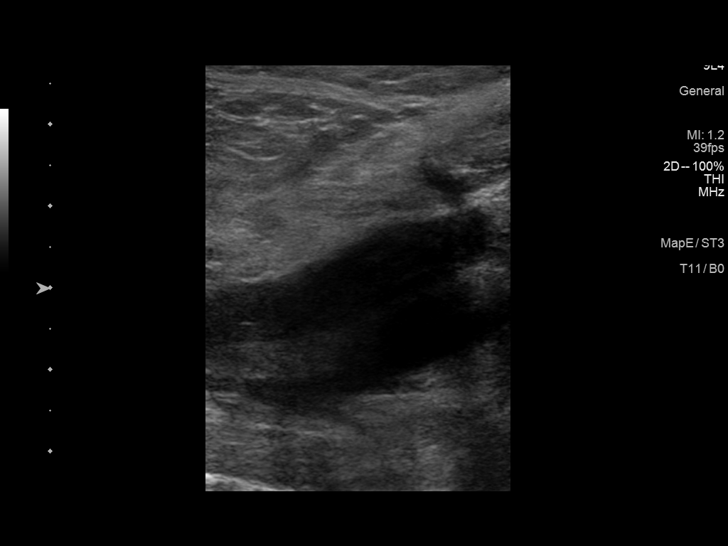
[im 24/29]
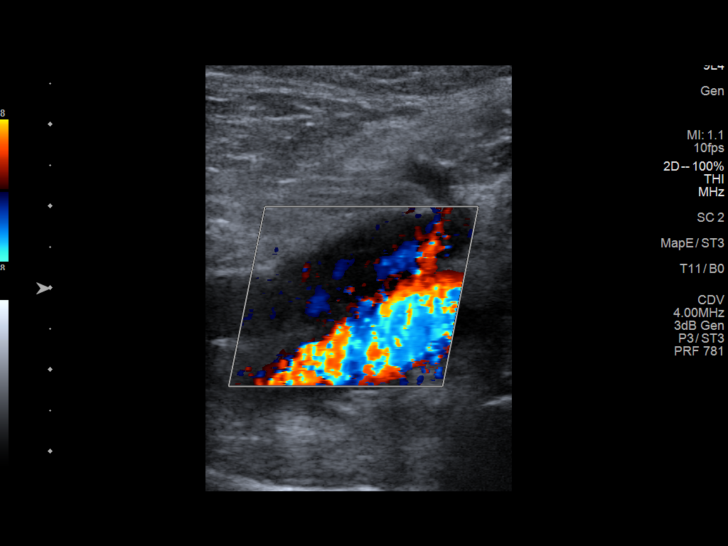
[im 26/29]
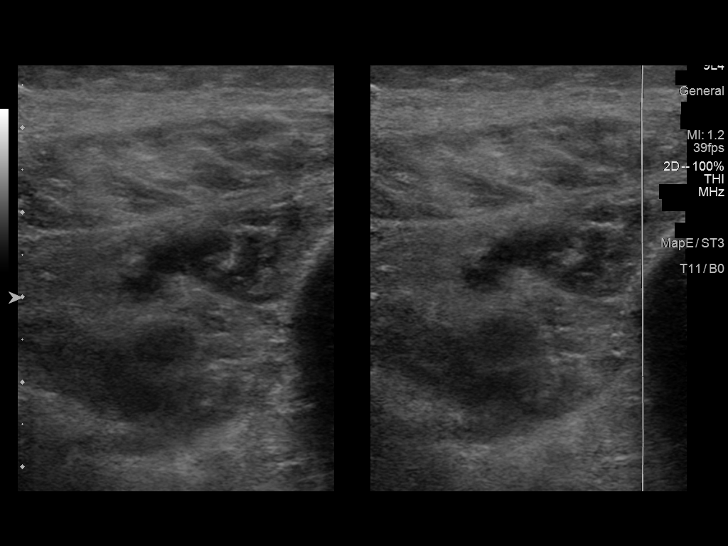
[im 29/29]
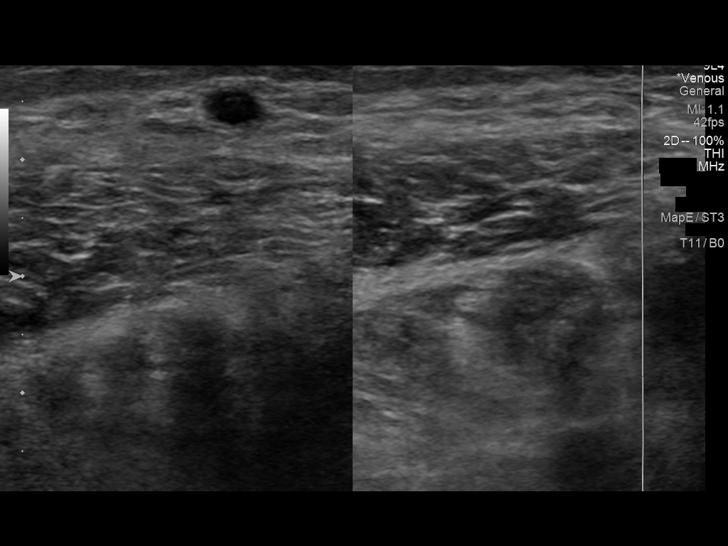

[14 of 24 positions shown; findings below may reference images not displayed]

FINDINGS: VENOUS

Residual heterogeneously echogenic, partially-occlusive filling
defect is demonstrated within the imaged portions of the LEFT CFV,
SFV, popliteal vein, and the visualized calf veins. The deep femoral
vein is patent. Thrombus extends across the SFJ but not into the
GSV.

Limited views of the contralateral common femoral vein are
unremarkable.

OTHER

No evidence of superficial thrombophlebitis or abnormal fluid
collection.

Limitations: none
IMPRESSION: Residual, partially-occlusive femoropopliteal DVT within the LEFT
lower extremity.

## 2021-06-10 NOTE — Progress Notes (Addendum)
? ?Referring Physician(s): ?Ardis Rowan ? ?Supervising Physician: [name] ? ?Chief Complaint: ?The patient is seen in follow up today s/p mechanical thrombectomy for submassive PE ? ?History of present illness: ? ?66 y/o M who presents for 1 month post procedural follow up. Pt suffered from acute cholecystitis and undwerwent lap cholecystectomy by Dr. Harlow Asa on 03/30/21. He had a previously planned trip to [place] in early March 2023, to celebrate his retirement but returned with LLE swelling and SOB. ER eval and workup was concerning for submassive PE and occlussive LLE DVT. Pt is known to Me and VIR service s/p pulmonary angiography and mechanical thrombectomy on 05/11/21. He was discharged on POD#2 on RA and on Eliquis. ? ?He was recently seen with Pulmonary Medicine (Dr. Silas Flood) and was noted to be doing well. He is joined in Clinic by his Wife and reports that his DOE is improved. He is back to playing golf with his friend and has been walking intentionally and believes he needs to work on improving his stamina. He reports that his LLE is not swollen and back to near-normal. ? ?Review of Systems: A 12-point ROS discussed, and pertinent positives are indicated in the HPI above.  All other systems are negative. ? ?Past Medical History:  ?Diagnosis Date  ? Arthritis   ? OA  ? Cough   ? WHITE SPUTUM OCC, NO FEVER COLD SYMPTOMS MAINLY  ? GERD (gastroesophageal reflux disease)   ? ? ?Past Surgical History:  ?Procedure Laterality Date  ? CHOLECYSTECTOMY N/A 03/30/2021  ? Procedure: LAPAROSCOPIC CHOLECYSTECTOMY WITH INTRAOPERATIVE CHOLANGIOGRAM;  Surgeon: Armandina Gemma, MD;  Location: [facility];  Service: General;  Laterality: N/A;  ? COLONOSCOPY  06/17/2011  ? Procedure: COLONOSCOPY;  Surgeon: [name], MD;  Location: [facility];  Service: Endoscopy;  Laterality: N/A;  ? IR ANGIOGRAM PULMONARY BILATERAL SELECTIVE  05/11/2021  ? IR ANGIOGRAM SELECTIVE EACH ADDITIONAL VESSEL  05/11/2021  ? IR ANGIOGRAM  SELECTIVE EACH ADDITIONAL VESSEL  05/11/2021  ? IR RADIOLOGIST EVAL & MGMT  06/06/2021  ? IR THROMBECT VENO MECH MOD SED  05/11/2021  ? IR US GUIDE VASC ACCESS RIGHT  05/11/2021  ? KNEE SURGERY Right YRS AGO  ? MENISCUS  ? THUMB ARTHROSCOPY Right YRS AGO  ? TOTAL HIP ARTHROPLASTY Left 06/25/2017  ? Procedure: LEFT TOTAL HIP ARTHROPLASTY ANTERIOR APPROACH;  Surgeon: Gaynelle Arabian, MD;  Location: [facility];  Service: Orthopedics;  Laterality: Left;  ? ? ?Allergies: ?Patient has no known allergies. ? ?Medications: ?Prior to Admission medications   ?Medication Sig Start Date End Date Taking? Authorizing Provider  ?acetaminophen (TYLENOL) 500 MG tablet Take 500 mg by mouth every 6 (six) hours as needed (for pain).    [provider]  ?apixaban (ELIQUIS) 5 MG TABS tablet Take 2 tablets (10 mg total) by mouth 2 (two) times daily. '10mg'$  two times day for 6  more days, Then decrease to '5mg'$  two times daily 05/13/21   [name], NP  ?aspirin EC 81 MG tablet Take 162 mg by mouth daily as needed.    [provider]  ?atorvastatin (LIPITOR) 10 MG tablet Take 1 tablet (10 mg total) by mouth daily. 01/11/21     ?Cholecalciferol (VITAMIN D3) 50 MCG (2000 UT) TABS Take 6,000 Units by mouth daily.    [provider]  ?Coenzyme Q10 (CO Q-10 PO) Take 1 capsule by mouth daily.    [provider]  ?ibuprofen (ADVIL) 200 MG tablet Take 600 mg by mouth every 6 (  six) hours as needed (for pain).    [provider]  ?Menaquinone-7 (VITAMIN K2 PO) Take 1 tablet by mouth daily.    [provider]  ?Omega-3 Fatty Acids (FISH OIL) 1000 MG CAPS Take 1 capsule (1,000 mg total) by mouth daily. 05/13/21   Garner Nash, DO  ?pantoprazole (PROTONIX) 40 MG tablet Take 1 tablet (40 mg total) by mouth daily. 03/31/21   [name], MD  ?vitamin C (ASCORBIC ACID) 500 MG tablet Take 500-1,000 mg by mouth daily.    [provider]  ?  ? ?Family History  ?Problem Relation Age of Onset  ?  CAD Brother   ? Hypertension Other   ? Diabetes Neg Hx   ? ? ?Social History  ? ?Socioeconomic History  ? Marital status: Married  ?  Spouse name: Not on file  ? Number of children: Not on file  ? Years of education: Not on file  ? Highest education level: Not on file  ?Occupational History  ? Not on file  ?Tobacco Use  ? Smoking status: Never  ? Smokeless tobacco: Never  ?Vaping Use  ? Vaping Use: Never used  ?Substance and Sexual Activity  ? Alcohol use: No  ? Drug use: No  ? Sexual activity: Not on file  ?Other Topics Concern  ? Not on file  ?Social History Narrative  ? Not on file  ? ?Social Determinants of Health  ? ?Financial Resource Strain: Not on file  ?Food Insecurity: Not on file  ?Transportation Needs: Not on file  ?Physical Activity: Not on file  ?Stress: Not on file  ?Social Connections: Not on file  ? ? ? ?Vital Signs: ?There were no vitals taken for this visit. ? ?Physical Exam ? ?General: WN, NAD  ?CV: RRR on monitor ?Pulm: normal work of breathing on RA ?Abd: S, ND, NT ?MSK: Grossly normal. LLE with minimal residual edema. ?Vascular exam. R groin c/d/I. Incision undetectable. ?Psych: Appropriate affect. ? ? ?Imaging: ?LLE Venous Duplex. Date: 06/06/2021 ? ?Independently reviewed, demonstrating.Marland Kitchen ?Residual, partially-occlusive femoropopliteal DVT within the LEFT ?lower extremity. ? ? ?Labs: ? ?CBC: ?Recent Labs  ?  03/30/21 ?0420 03/31/21 ?0430 05/11/21 ?1052 05/11/21 ?1102 05/13/21 ?0651  ?WBC 5.6 7.0 17.4*  --  12.2*  ?HGB 14.1 14.1 14.2 15.3 11.1*  ?HCT 41.3 42.1 44.4 45.0 33.9*  ?PLT 146* 168 157  --  134*  ? ? ?COAGS: ?No results for input(s): INR, APTT in the last 8760 hours. ? ?BMP: ?Recent Labs  ?  03/31/21 ?0430 05/11/21 ?1052 05/11/21 ?1102 05/11/21 ?2332 05/13/21 ?4431  ?NA 138 136 138 137 137  ?K 4.4 3.8 4.2 4.4 4.0  ?CL 107 101 101 102 102  ?CO2 25 24  --  24 23  ?GLUCOSE 123* 148* 149* 141* 106*  ?BUN '17 20 22 16 14  '$ ?CALCIUM 8.6* 9.3  --  8.9 8.6*  ?CREATININE 1.27* 1.14 1.20 1.41*  1.36*  ?GFRNONAA >60 >60  --  55* 58*  ? ? ? ?Assessment and Plan: ? ?66 y/o M w PMHx significant for submassive PE and occlussive LLE DVT who presents for 1 month post pulmonary mechanical thrombectomy ? ?Asymptomatic from both cardiopulmonary and LLE DVT perspectives. ? ?06/06/21 Venous Duplex revealing residual, partially-occlusive femoropopliteal DVT within the LLE ? ?*on [facility] with Eliquis, managed per Primary ?*No indication for intervention for LLE DVT ?*recommended compression stocking to augment venous pump ?*Physical activity as tolerated ?*follow up repeat LLE venous duplex in 3 months,  and Pt requests in person visit with Me in Clinic on same day ? ? ? ?Electronically Signed: ?Thank you for allowing [place] to participate in the care of your Patient. ? ?[name], MD ?[facility] ?[facility] ? ? ?Pager. [phone] ?Clinic. [phone] ? ?I spent a total of 25 Minutes in face to face in clinical consultation, greater than 50% of which was counseling/coordinating care for follow up for Mr. Ole E Anding's submassive PE and LLE DVT ? ?

## 2021-06-29 ENCOUNTER — Other Ambulatory Visit (HOSPITAL_COMMUNITY): Payer: Self-pay

## 2021-06-29 MED ORDER — ATORVASTATIN CALCIUM 10 MG PO TABS
10.0000 mg | ORAL_TABLET | Freq: Every day | ORAL | 3 refills | Status: DC
Start: 2021-06-29 — End: 2022-03-11
  Filled 2021-06-29: qty 90, 90d supply, fill #0

## 2021-08-09 ENCOUNTER — Other Ambulatory Visit (HOSPITAL_COMMUNITY): Payer: Self-pay

## 2021-08-15 ENCOUNTER — Ambulatory Visit (HOSPITAL_COMMUNITY): Payer: Medicare Other | Attending: Internal Medicine

## 2021-08-15 DIAGNOSIS — I2602 Saddle embolus of pulmonary artery with acute cor pulmonale: Secondary | ICD-10-CM | POA: Diagnosis present

## 2021-08-15 LAB — ECHOCARDIOGRAM LIMITED
Area-P 1/2: 2.95 cm2
S' Lateral: 3.1 cm

## 2021-08-16 ENCOUNTER — Encounter: Payer: Self-pay | Admitting: Pulmonary Disease

## 2021-08-16 ENCOUNTER — Ambulatory Visit (INDEPENDENT_AMBULATORY_CARE_PROVIDER_SITE_OTHER): Payer: Medicare Other | Admitting: Pulmonary Disease

## 2021-08-16 ENCOUNTER — Other Ambulatory Visit: Payer: Self-pay | Admitting: Interventional Radiology

## 2021-08-16 VITALS — BP 124/74 | HR 58 | Temp 98.2°F | Ht 67.0 in | Wt 191.0 lb

## 2021-08-16 DIAGNOSIS — I2601 Septic pulmonary embolism with acute cor pulmonale: Secondary | ICD-10-CM

## 2021-08-16 DIAGNOSIS — I2602 Saddle embolus of pulmonary artery with acute cor pulmonale: Secondary | ICD-10-CM | POA: Diagnosis not present

## 2021-08-16 NOTE — Progress Notes (Signed)
$'@Patient'G$  ID: Juan Snow, male    DOB: 1955-09-12, 66 y.o.   MRN: [id number]  Chief Complaint  Patient presents with   Follow-up    Pt is here for follow up for PE. Pt states no issues noted with his breathing. Pt is still on Eliquis daily. No side effects noted with the medication so far.     Referring provider: Vernie Shanks, MD  HPI:   66 y.o. man whom we are seeing in follow-up for submassive PE.    Patient returns for routine follow-up.  Echocardiogram repeated this week, 3 months since PE, shows resolution of RV dysfunction poor normal estimated PASP/RVSP.  He feels well.  Back to exercising regularly.  4 days of the week.  Playing golf without issues.  No concerns for exertion no dyspnea.  No chest pain.  No issues of bleeding.  Tolerating Eliquis well.  Very expensive.  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  Reports he had cholecystitis shortly thereafter and had cholecystectomy.  Then traveled to [place] early March 2023.  Returns with symptoms of lower extremity swelling.  He was noted to be tachycardic and tachypneic although saturating well on room air.  CTA PE protocol of the chest on my review interpretation revealed significant clot burden with clear lungs bilaterally.  He started on heparin.  Subsequently IR was consulted.  Went for mechanical thrombectomy.  Was transition to Eliquis at time of discharge.  Overall his symptoms are improving.  Dyspnea improving.  Feels like his breathing is fine but needs work on his stamina.  Graduated exercise program he is undergoing at the discretion of his PCP.  We discussed this.  Agreed with the plan.  He is gradually improving.  Denies any chest pain.  No other concerns.  PMH: Hyperlipidemia, GERD Surgical history: Cholecystectomy, knee surgery, hip surgery, mechanical thrombectomy for acute submassive PE Family history: Brother with CAD Social history: Works at  [employer] for 28 years, retired 2023, never smoker Family History  Problem Relation Age of Onset   CAD Brother    Hypertension Other    Diabetes Neg Hx     Past Surgical History:  Procedure Laterality Date   CHOLECYSTECTOMY N/A 03/30/2021   Procedure: LAPAROSCOPIC CHOLECYSTECTOMY WITH INTRAOPERATIVE CHOLANGIOGRAM;  Surgeon: Armandina Gemma, MD;  Location: [facility];  Service: General;  Laterality: N/A;   COLONOSCOPY  06/17/2011   Procedure: COLONOSCOPY;  Surgeon: [name], MD;  Location: [facility];  Service: Endoscopy;  Laterality: N/A;   IR ANGIOGRAM PULMONARY BILATERAL SELECTIVE  05/11/2021   IR ANGIOGRAM SELECTIVE EACH ADDITIONAL VESSEL  05/11/2021   IR ANGIOGRAM SELECTIVE EACH ADDITIONAL VESSEL  05/11/2021   IR RADIOLOGIST EVAL & MGMT  06/06/2021   IR THROMBECT VENO MECH MOD SED  05/11/2021   IR US GUIDE VASC ACCESS RIGHT  05/11/2021   KNEE SURGERY Right YRS AGO   MENISCUS   THUMB ARTHROSCOPY Right YRS AGO   TOTAL HIP ARTHROPLASTY Left 06/25/2017   Procedure: LEFT TOTAL HIP ARTHROPLASTY ANTERIOR APPROACH;  Surgeon: Gaynelle Arabian, MD;  Location: [facility];  Service: Orthopedics;  Laterality: Left;       Questionaires / Pulmonary Flowsheets:   ACT:      No data to display           MMRC:     No data to display           Epworth:  No data to display           Tests:   FENO:  No results found for: "NITRICOXIDE"  PFT:     No data to display           WALK:      No data to display           Imaging: Personally reviewed and as per EMR discussion this note ECHOCARDIOGRAM LIMITED  Result Date: 08/15/2021    ECHOCARDIOGRAM LIMITED REPORT   Patient Name:   Juan Snow Date of Exam: 08/15/2021 Medical Rec #:  [id number]       Height:       67.0 in Accession #:    [id number]      Weight:       177.0 lb Date of Birth:  Sep 15, 1955        BSA:          1.920 m Patient Age:    27 years        BP:           162/100 mmHg Patient Gender:  M               HR:           58 bpm. 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 AKI.  Sonographer:    Marygrace Drought RCS Referring Phys: Eugene  1. Left ventricular ejection fraction, by estimation, is 60 to 65%. The left ventricle has normal function. The left ventricle has no regional wall motion abnormalities. There is mild left ventricular hypertrophy. Left ventricular diastolic parameters are consistent with Grade I diastolic dysfunction (impaired relaxation).  2. Right ventricular systolic function is normal. The right ventricular size is normal. There is normal pulmonary artery systolic pressure. The estimated right ventricular systolic pressure is 03.0 mmHg.  3. The aortic valve is tricuspid. Aortic valve regurgitation is not visualized.  4. No evidence of "saddle" PE, the main PA bifrucation is visualized.  5. The inferior vena cava is normal in size with greater than 50% respiratory variability, suggesting right atrial pressure of 3 mmHg. Comparison(s): Changes from prior study are noted. 05/11/2021: LVEF 50-55%, moderate RV dysfuction with positive McConnell's sign. Compared to the prior study, the RV systolic dysfunction has resolved. FINDINGS  Left Ventricle: Left ventricular ejection fraction, by estimation, is 60 to 65%. The left ventricle has normal function. The left ventricle has no regional wall motion abnormalities. The left ventricular internal cavity size was normal in size. There is  mild left ventricular hypertrophy. Left ventricular diastolic parameters are consistent with Grade I diastolic dysfunction (impaired relaxation). Indeterminate filling pressures. Right Ventricle: The right ventricular size is  normal. No increase in right ventricular wall thickness. Right ventricular systolic function is normal. There is normal pulmonary artery systolic pressure. The tricuspid regurgitant velocity is 2.58 m/s, and  with an assumed right atrial pressure of 3 mmHg, the estimated right ventricular systolic pressure is 09.2 mmHg. Pericardium: There is no evidence of pericardial effusion. Tricuspid Valve: The tricuspid valve is grossly normal. Tricuspid valve regurgitation is trivial. Aortic Valve: The aortic valve is tricuspid.  Aortic valve regurgitation is not visualized. Pulmonic Valve: The pulmonic valve was normal in structure. Pulmonic valve regurgitation is not visualized. Aorta: The aortic root and ascending aorta are structurally normal, with no evidence of dilitation. Pulmonary Artery: No evidence of "saddle" PE, the main PA bifrucation is visualized. Venous: The inferior vena cava is normal in size with greater than 50% respiratory variability, suggesting right atrial pressure of 3 mmHg. LEFT VENTRICLE PLAX 2D LVIDd:         4.20 cm   Diastology LVIDs:         3.10 cm   LV e' medial:    7.18 cm/s LV PW:         1.00 cm   LV E/e' medial:  9.3 LV IVS:        1.10 cm   LV e' lateral:   9.36 cm/s LVOT diam:     2.00 cm   LV E/e' lateral: 7.1 LVOT Area:     3.14 cm                           3D Volume EF:                          3D EF:        60 %                          LV EDV:       150 ml                          LV ESV:       61 ml                          LV SV:        90 ml RIGHT VENTRICLE RV Basal diam:  3.40 cm RV Mid diam:    2.50 cm RV S prime:     16.50 cm/s TAPSE (M-mode): 1.4 cm RVSP:           29.6 mmHg LEFT ATRIUM         Index       RIGHT ATRIUM           Index LA diam:    4.00 cm 2.08 cm/m  RA Pressure: 3.00 mmHg                                 RA Area:     10.30 cm                                 RA Volume:   20.40 ml  10.63 ml/m   AORTA Ao Root diam: 3.20 cm Ao Asc diam:  3.20 cm MITRAL VALVE                TRICUSPID VALVE MV Area (PHT):             TR Peak grad:   26.6 mmHg MV Decel Time:             TR Vmax:        258.00 cm/s MV E velocity: 66.60 cm/s  Estimated RAP:  3.00  mmHg MV A velocity: 82.30 cm/s  RVSP:           29.6 mmHg MV E/A ratio:  0.81                            SHUNTS                            Systemic Diam: 2.00 cm Lyman Bishop MD Electronically signed by Lyman Bishop MD Signature Date/Time: 08/15/2021/11:40:27 AM    Final     Lab Results: Personally reviewed CBC    Component Value Date/Time   WBC 12.2 (H) 05/13/2021 0651   RBC 3.83 (L) 05/13/2021 0651   HGB 11.1 (L) 05/13/2021 0651   HCT 33.9 (L) 05/13/2021 0651   PLT 134 (L) 05/13/2021 0651   MCV 88.5 05/13/2021 0651   MCH 29.0 05/13/2021 0651   MCHC 32.7 05/13/2021 0651   RDW 13.8 05/13/2021 0651   LYMPHSABS 0.9 05/11/2021 1052   MONOABS 0.9 05/11/2021 1052   EOSABS 0.1 05/11/2021 1052   BASOSABS 0.1 05/11/2021 1052    BMET    Component Value Date/Time   NA 137 05/13/2021 0651   K 4.0 05/13/2021 0651   CL 102 05/13/2021 0651   CO2 23 05/13/2021 0651   GLUCOSE 106 (H) 05/13/2021 0651   BUN 14 05/13/2021 0651   CREATININE 1.36 (H) 05/13/2021 0651   CALCIUM 8.6 (L) 05/13/2021 0651   GFRNONAA 58 (L) 05/13/2021 0651   GFRAA >60 06/26/2017 0529    BNP    Component Value Date/Time   BNP 23.1 05/11/2021 1052    ProBNP No results found for: "PROBNP"  Specialty Problems       Pulmonary Problems   Aspiration pneumonia ([place])    No Known Allergies  Immunization History  Administered Date(s) Administered   Influenza Split 12/02/2016   Influenza,inj,quad, With Preservative 12/02/2016   Janssen (J&J) SARS-COV-2 Vaccination 07/28/2019    Past Medical History:  Diagnosis Date   Arthritis    OA   Cough    WHITE SPUTUM OCC, NO FEVER COLD SYMPTOMS MAINLY   GERD (gastroesophageal reflux disease)     Tobacco History: Social History   Tobacco Use  Smoking Status Never  Smokeless  Tobacco Never   Counseling given: Not Answered   Continue to not smoke  Outpatient Encounter Medications as of 08/16/2021  Medication Sig   acetaminophen (TYLENOL) 500 MG tablet Take 500 mg by mouth every 6 (six) hours as needed (for pain).   apixaban (ELIQUIS) 5 MG TABS tablet Take 2 tablets (10 mg total) by mouth 2 (two) times daily. '10mg'$  two times day for 6  more days, Then decrease to '5mg'$  two times daily   aspirin EC 81 MG tablet Take 162 mg by mouth daily as needed.   atorvastatin (LIPITOR) 10 MG tablet Take 1 tablet (10 mg total) by mouth daily.   Cholecalciferol (VITAMIN D3) 50 MCG (2000 UT) TABS Take 6,000 Units by mouth daily.   Coenzyme Q10 (CO Q-10 PO) Take 1 capsule by mouth daily.   ibuprofen (ADVIL) 200 MG tablet Take 600 mg by mouth every 6 (six) hours as needed (for pain).   Menaquinone-7 (VITAMIN K2 PO) Take 1 tablet by mouth daily.   Omega-3 Fatty Acids (FISH OIL) 1000 MG CAPS Take 1 capsule (1,000 mg total) by mouth daily.   pantoprazole (PROTONIX) 40 MG tablet Take 1  tablet (40 mg total) by mouth daily.   vitamin C (ASCORBIC ACID) 500 MG tablet Take 500-1,000 mg by mouth daily.   atorvastatin (LIPITOR) 10 MG tablet Take 1 tablet (10 mg total) by mouth daily.   No facility-administered encounter medications on file as of 08/16/2021.     Review of Systems  Review of Systems  N/a Physical Exam  BP 124/74 (BP Location: Right Arm, Patient Position: Sitting, Cuff Size: Normal)   Pulse (!) 58   Temp 98.2 F (36.8 C) (Oral)   Ht '5\' 7"'$  (1.702 m)   Wt 191 lb (86.6 kg)   SpO2 96%   BMI 29.91 kg/m   Wt Readings from Last 5 Encounters:  08/16/21 191 lb (86.6 kg)  06/04/21 177 lb (80.3 kg)  05/13/21 179 lb 10.8 oz (81.5 kg)  03/30/21 188 lb (85.3 kg)  06/25/17 180 lb (81.6 kg)    BMI Readings from Last 5 Encounters:  08/16/21 29.91 kg/m  06/04/21 27.72 kg/m  05/13/21 28.14 kg/m  03/30/21 28.59 kg/m  06/25/17 27.78 kg/m     Physical Exam General:  Well-appearing, no acute distress Eyes: EOMI, icterus Neck: Supple, no JVP sitting upright Pulmonary: Clear, no work of breathing Cardiovascular: Regular rate and rhythm, no murmur Neuro: Normal gait, no weakness   Assessment & Plan:   Submassive PE: Provoked in the setting of recent travel.  Status post mechanical thrombectomy.  RV dysfunction on TTE 05/2021.  Symptoms have resolved.  Reports good adherence to Eliquis 5 mg twice daily.  S repeat TTE 08/2021 resolution of RV dysfunction, normal estimated right-sided pressures.  Given provoked nature of PE and resolution of symptoms as well as RV changes, okay to stop anticoagulation since completed 3+ months of therapy.  Has about 14 days left in his current prescription and then will stop.  Dyspnea on exertion: Resolved.  Likely just due to PE and deconditioning while hospitalized.  No further work-up.   Return if symptoms worsen or fail to improve.   Lanier Clam, MD 08/16/2021

## 2021-08-16 NOTE — Patient Instructions (Signed)
Nice to see you again  Continue exercising  Ok to stop Eliquis when your current bottle runs out  Follow up as needed

## 2021-09-06 ENCOUNTER — Encounter: Payer: Self-pay | Admitting: *Deleted

## 2021-09-06 ENCOUNTER — Ambulatory Visit
Admission: RE | Admit: 2021-09-06 | Discharge: 2021-09-06 | Disposition: A | Discharge status: Home / Self Care | Payer: Medicare Other | Source: Ambulatory Visit | Attending: Interventional Radiology | Admitting: Interventional Radiology

## 2021-09-06 DIAGNOSIS — I2601 Septic pulmonary embolism with acute cor pulmonale: Secondary | ICD-10-CM

## 2021-09-06 HISTORY — PX: IR RADIOLOGIST EVAL & MGMT: IMG5224

## 2021-09-11 NOTE — Progress Notes (Signed)
Referring Provider(s): Ardis Rowan  Reason for visit: The patient is seen in follow up today s/p residual LLE DVT. Hx of submassive PE s/p thrombectomy.  History of present illness:  66 y/o M who presents for 4 month post procedural follow up, last seen by me 3 months ago in initial visit s/p thrombectomy for submassive PE on 05/11/21. Pt also had an occlussive LLE DVT but had minor symptoms and elected for conservative management. He was discharged from acute hospitalization on POD#2 on RA and on Eliquis.   He has been seen in follow up with Pulmonary Medicine (Dr. Silas Flood) and was noted to be doing well, and has since completed his 3 month course of anticoagulation. He reports feeling much improved. He continues to play golf and walks intentionally to improve his stamina. He reports that his LLE is not swollen and asymptomatic.    Review of Systems: A 12-point ROS discussed, and pertinent positives are indicated in the HPI above.  All other systems are negative.   Past Medical History:  Diagnosis Date   Arthritis    OA   Cough    WHITE SPUTUM OCC, NO FEVER COLD SYMPTOMS MAINLY   GERD (gastroesophageal reflux disease)     Past Surgical History:  Procedure Laterality Date   CHOLECYSTECTOMY N/A 03/30/2021   Procedure: LAPAROSCOPIC CHOLECYSTECTOMY WITH INTRAOPERATIVE CHOLANGIOGRAM;  Surgeon: Armandina Gemma, MD;  Location: [facility];  Service: General;  Laterality: N/A;   COLONOSCOPY  06/17/2011   Procedure: COLONOSCOPY;  Surgeon: [name], MD;  Location: [facility];  Service: Endoscopy;  Laterality: N/A;   IR ANGIOGRAM PULMONARY BILATERAL SELECTIVE  05/11/2021   IR ANGIOGRAM SELECTIVE EACH ADDITIONAL VESSEL  05/11/2021   IR ANGIOGRAM SELECTIVE EACH ADDITIONAL VESSEL  05/11/2021   IR RADIOLOGIST EVAL & MGMT  06/06/2021   IR RADIOLOGIST EVAL & MGMT  09/06/2021   IR THROMBECT VENO MECH MOD SED  05/11/2021   IR US GUIDE VASC ACCESS RIGHT  05/11/2021   KNEE SURGERY Right YRS AGO    MENISCUS   THUMB ARTHROSCOPY Right YRS AGO   TOTAL HIP ARTHROPLASTY Left 06/25/2017   Procedure: LEFT TOTAL HIP ARTHROPLASTY ANTERIOR APPROACH;  Surgeon: Gaynelle Arabian, MD;  Location: [facility];  Service: Orthopedics;  Laterality: Left;    Allergies: Patient has no known allergies.  Medications: Prior to Admission medications   Medication Sig Start Date End Date Taking? Authorizing Provider  acetaminophen (TYLENOL) 500 MG tablet Take 500 mg by mouth every 6 (six) hours as needed (for pain).    [provider]  apixaban (ELIQUIS) 5 MG TABS tablet Take 2 tablets (10 mg total) by mouth 2 (two) times daily. '10mg'$  two times day for 6  more days, Then decrease to '5mg'$  two times daily 05/13/21   [name], NP  aspirin EC 81 MG tablet Take 162 mg by mouth daily as needed.    [provider]  atorvastatin (LIPITOR) 10 MG tablet Take 1 tablet (10 mg total) by mouth daily. 06/29/21     Cholecalciferol (VITAMIN D3) 50 MCG (2000 UT) TABS Take 6,000 Units by mouth daily.    [provider]  Coenzyme Q10 (CO Q-10 PO) Take 1 capsule by mouth daily.    [provider]  ibuprofen (ADVIL) 200 MG tablet Take 600 mg by mouth every 6 (six) hours as needed (for pain).    [provider]  Menaquinone-7 (VITAMIN K2 PO) Take 1 tablet by mouth daily.    [provider]  Omega-3 Fatty Acids (FISH OIL) 1000 MG CAPS Take 1 capsule (1,000 mg total) by mouth daily. 05/13/21   Icard, [name], DO  pantoprazole (PROTONIX) 40 MG tablet Take 1 tablet (40 mg total) by mouth daily. 03/31/21   [name], MD  vitamin C (ASCORBIC ACID) 500 MG tablet Take 500-1,000 mg by mouth daily.    [provider]     Family History  Problem Relation Age of Onset   CAD Brother    Hypertension Other    Diabetes Neg Hx     Social History   Socioeconomic History   Marital status: Married    Spouse name: Not on file   Number of children: Not on file   Years of  education: Not on file   Highest education level: Not on file  Occupational History   Not on file  Tobacco Use   Smoking status: Never   Smokeless tobacco: Never  Vaping Use   Vaping Use: Never used  Substance and Sexual Activity   Alcohol use: No   Drug use: No   Sexual activity: Not on file  Other Topics Concern   Not on file  Social History Narrative   Not on file   Social Determinants of Health   Financial Resource Strain: Not on file  Food Insecurity: Not on file  Transportation Needs: Not on file  Physical Activity: Not on file  Stress: Not on file  Social Connections: Not on file    Vital Signs: There were no vitals taken for this visit.  Physical Exam  General: WN, NAD  CV: RRR on monitor Pulm: normal work of breathing on RA Abd: S, ND, NT MSK: Grossly normal. LLE with no residual edema. Psych: Appropriate affect.     Imaging:  LLE Venous Duplex. Date: 09/06/2021   Independently reviewed, demonstrating.. Improving LEFT lower extremity DVT burden with residual partially-occlusive defects within the distal LEFT lower extremity  Labs:  CBC: Recent Labs    03/30/21 0420 03/31/21 0430 05/11/21 1052 05/11/21 1102 05/13/21 0651  WBC 5.6 7.0 17.4*  --  12.2*  HGB 14.1 14.1 14.2 15.3 11.1*  HCT 41.3 42.1 44.4 45.0 33.9*  PLT 146* 168 157  --  134*    COAGS: No results for input(s): "INR", "APTT" in the last 8760 hours.   Assessment and Plan:  66 y/o M w PMHx significant for submassive PE and occlussive LLE DVT who presents for 4 month follow up post thrombectomy for residual LLE DVT   Asymptomatic from both cardiopulmonary and LLE DVT perspectives. Villalta Score for Severity of Post-thrombotic Syndrome: 0 Pts, not present   09/06/21 Venous Duplex revealing residual, partially-occlusive residual DVT within the distal LEFT lower extremity    *completed AC course (Eliquis) *VCS = 0, asymptomatic. No intervention for LLE DVT *reinforced  recommendedation for compression stocking to augment venous pump *Physical activity as tolerated *follow up repeat LLE venous duplex in 3 months, until resolution. No clinic visit.   Electronically Signed:  [name], MD [facility]   Pager. [phone] Clinic. [phone]   I spent a total of 25 Minutes in face to face in clinical consultation, greater than 50% of which was counseling/coordinating care for Juan Snow's PE follow up and LLE DVT treatment

## 2021-11-12 ENCOUNTER — Other Ambulatory Visit: Payer: Self-pay | Admitting: Interventional Radiology

## 2021-11-12 DIAGNOSIS — I2601 Septic pulmonary embolism with acute cor pulmonale: Secondary | ICD-10-CM

## 2021-12-05 ENCOUNTER — Other Ambulatory Visit: Payer: Self-pay

## 2021-12-05 ENCOUNTER — Encounter (HOSPITAL_COMMUNITY): Payer: Self-pay

## 2021-12-05 ENCOUNTER — Emergency Department (HOSPITAL_COMMUNITY)
Admission: EM | Admit: 2021-12-05 | Discharge: 2021-12-05 | Disposition: A | Discharge status: Home / Self Care | Payer: Medicare Other | Attending: Emergency Medicine | Admitting: Emergency Medicine

## 2021-12-05 DIAGNOSIS — M7989 Other specified soft tissue disorders: Secondary | ICD-10-CM

## 2021-12-05 DIAGNOSIS — Z7901 Long term (current) use of anticoagulants: Secondary | ICD-10-CM | POA: Diagnosis not present

## 2021-12-05 DIAGNOSIS — M79605 Pain in left leg: Secondary | ICD-10-CM | POA: Diagnosis not present

## 2021-12-05 MED ORDER — ENOXAPARIN SODIUM 100 MG/ML IJ SOSY
1.0000 mg/kg | PREFILLED_SYRINGE | Freq: Once | INTRAMUSCULAR | Status: AC
  Administered 2021-12-05: 85 mg via SUBCUTANEOUS
  Filled 2021-12-05: qty 0.85

## 2021-12-05 NOTE — ED Provider Notes (Signed)
[facility] DEPT Provider Note   CSN: [id number] Arrival date & time: 12/05/21  2133     History  Chief Complaint  Patient presents with   Leg Swelling    Juan Snow is a 66 y.o. male.  The history is provided by the patient and medical records. No language interpreter was used.     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  Patient states earlier today he was in the process of removing his shoes when he felt pain to his left leg.  Pain primarily to his posterior knee radiates to his calf with associated leg swelling.  No chest pain or shortness of breath no fever chills no significant injury.  He is currently not on any blood thinner medication.  He denies noticing any signs of infection.  No specific treatment tried.  Home Medications Prior to Admission medications   Medication Sig Start Date End Date Taking? Authorizing Provider  acetaminophen (TYLENOL) 500 MG tablet Take 500 mg by mouth every 6 (six) hours as needed (for pain).    [provider]  apixaban (ELIQUIS) 5 MG TABS tablet Take 2 tablets (10 mg total) by mouth 2 (two) times daily. '10mg'$  two times day for 6  more days, Then decrease to '5mg'$  two times daily 05/13/21   [name], NP  aspirin EC 81 MG tablet Take 162 mg by mouth daily as needed.    [provider]  atorvastatin (LIPITOR) 10 MG tablet Take 1 tablet (10 mg total) by mouth daily. 06/29/21     Cholecalciferol (VITAMIN D3) 50 MCG (2000 UT) TABS Take 6,000 Units by mouth daily.    [provider]  Coenzyme Q10 (CO Q-10 PO) Take 1 capsule by mouth daily.    [provider]  ibuprofen (ADVIL) 200 MG tablet Take 600 mg by mouth every 6 (six) hours as needed (for pain).    [provider]  Menaquinone-7 (VITAMIN K2 PO) Take 1 tablet by mouth daily.    [provider]  Omega-3 Fatty  Acids (FISH OIL) 1000 MG CAPS Take 1 capsule (1,000 mg total) by mouth daily. 05/13/21   Icard, [name], DO  pantoprazole (PROTONIX) 40 MG tablet Take 1 tablet (40 mg total) by mouth daily. 03/31/21   [name], MD  vitamin C (ASCORBIC ACID) 500 MG tablet Take 500-1,000 mg by mouth daily.    [provider]      Allergies    Patient has no known allergies.    Review of Systems   Review of Systems  All other systems reviewed and are negative.   Physical Exam Updated Vital Signs BP (!) 141/98 (BP Location: Left Arm)   Pulse 66   Temp 98.4 F (36.9 C) (Oral)   Resp 18   Ht '5\' 7"'$  (1.702 m)   Wt 85.3 kg   SpO2 92%   BMI 29.44 kg/m  Physical Exam Vitals and nursing note reviewed.  Constitutional:      General: He is not in acute distress.    Appearance: He is well-developed.  HENT:     Head: Atraumatic.  Eyes:     Conjunctiva/sclera: Conjunctivae normal.  Cardiovascular:     Rate and Rhythm: Normal rate and regular rhythm.     Pulses: Normal pulses.     Heart sounds: Normal heart sounds.  Musculoskeletal:     Cervical  back: Neck supple.     Left lower leg: Edema (Trace edema appreciated to left lower extremity compared to right.  Tenderness to left calf.  Left knee nontender with normal flexion extension.) present.  Skin:    Capillary Refill: Capillary refill takes less than 2 seconds.     Findings: No rash.  Neurological:     Mental Status: He is alert.     ED Results / Procedures / Treatments   Labs (all labs ordered are listed, but only abnormal results are displayed) Labs Reviewed - No data to display  EKG None  Radiology No results found.  Procedures Procedures    Medications Ordered in ED Medications  enoxaparin (LOVENOX) injection 85 mg (85 mg Subcutaneous Given 12/05/21 2324)    ED Course/ Medical Decision Making/ A&P                           Medical Decision Making  BP (!) 141/98 (BP Location: Left Arm)   Pulse 66    Temp 98.4 F (36.9 C) (Oral)   Resp 18   Ht '5\' 7"'$  (1.702 m)   Wt 85.3 kg   SpO2 92%   BMI 29.44 kg/m   76:23 PM 66 year old male history of prior PE who currently not on any blood thinner medication presenting complaining of pain and swelling involving his left lower extremity.  He has not noticed pain to the back of his knee and earlier today while he was in the process of removing his shoes.  He thought he may have tweaked his knee which caused the pain.  He also noticed increased swelling to the left leg compared to right.  He does not endorse any chest pain or shortness of breath no fever or chills no specific trauma.  He denies having back pain.  No report of numbness or weakness.  On exam this is a well-appearing elderly male in no acute discomfort.  He does have tenderness to his left calf and trace edema to his left lower extremity compared to the right lower extremity.  Intact dorsalis pedis pulse.  Left knee with normal flexion extension and no deformity noted.  No tenderness along his midline spine.  Heart lung sounds normal.  Vital signs is reassuring no fever no hypoxia.  Due to history of prior blood clot, patient will benefit from venous Doppler ultrasound of his left lower extremity to rule out DVT.  Unfortunately ultrasound is not available at this time therefore I will give patient prophylactic Lovenox at 1 mg/kg and patient will return tomorrow morning for Doppler study.  He voiced understanding and agrees with plan.  I have consider fracture, dislocation, cellulitis, compartment syndrome, or radicular pain but felt it is less likely based on history and physical.  I have reviewed patient's EMR and considered my plan of care.        Final Clinical Impression(s) / ED Diagnoses Final diagnoses:  Left leg swelling    Rx / DC Orders ED Discharge Orders          Ordered    VAS [place] LOWER EXTREMITY VENOUS (DVT)        12/05/21 2326              Domenic Moras,  PA-C 12/05/21 2327    Tegeler, Gwenyth Allegra, MD 12/05/21 2332

## 2021-12-05 NOTE — Discharge Instructions (Addendum)
You have been evaluated for your leg swelling.  Please return tomorrow morning and request for a venous Doppler DVT study of your left lower extremity to rule out potential blood clot.

## 2021-12-05 NOTE — ED Triage Notes (Addendum)
Pt reports with left lower leg swelling since Sunday after injuring it. Pt reports having a DVT in that same leg in March. Not currently on blood thinners.

## 2021-12-06 ENCOUNTER — Other Ambulatory Visit (HOSPITAL_COMMUNITY): Payer: Self-pay

## 2021-12-06 ENCOUNTER — Encounter (HOSPITAL_COMMUNITY): Payer: Self-pay

## 2021-12-06 ENCOUNTER — Emergency Department (HOSPITAL_BASED_OUTPATIENT_CLINIC_OR_DEPARTMENT_OTHER)
Admission: RE | Admit: 2021-12-06 | Discharge: 2021-12-06 | Disposition: A | Discharge status: Home / Self Care | Payer: Medicare Other | Source: Ambulatory Visit | Attending: Emergency Medicine | Admitting: Emergency Medicine

## 2021-12-06 ENCOUNTER — Emergency Department (HOSPITAL_COMMUNITY)
Admission: EM | Admit: 2021-12-06 | Discharge: 2021-12-06 | Disposition: A | Discharge status: Home / Self Care | Payer: Medicare Other | Attending: Emergency Medicine | Admitting: Emergency Medicine

## 2021-12-06 DIAGNOSIS — M79605 Pain in left leg: Secondary | ICD-10-CM | POA: Diagnosis present

## 2021-12-06 DIAGNOSIS — Z7901 Long term (current) use of anticoagulants: Secondary | ICD-10-CM | POA: Diagnosis not present

## 2021-12-06 DIAGNOSIS — I82432 Acute embolism and thrombosis of left popliteal vein: Secondary | ICD-10-CM | POA: Diagnosis not present

## 2021-12-06 DIAGNOSIS — R52 Pain, unspecified: Secondary | ICD-10-CM

## 2021-12-06 DIAGNOSIS — M7989 Other specified soft tissue disorders: Secondary | ICD-10-CM | POA: Diagnosis not present

## 2021-12-06 LAB — CBC WITH DIFFERENTIAL/PLATELET
Abs Immature Granulocytes: 0.04 10*3/uL (ref 0.00–0.07)
Basophils Absolute: 0 10*3/uL (ref 0.0–0.1)
Basophils Relative: 0 %
Eosinophils Absolute: 0.1 10*3/uL (ref 0.0–0.5)
Eosinophils Relative: 2 %
HCT: 48.5 % (ref 39.0–52.0)
Hemoglobin: 16.2 g/dL (ref 13.0–17.0)
Immature Granulocytes: 1 %
Lymphocytes Relative: 10 %
Lymphs Abs: 0.5 10*3/uL — ABNORMAL LOW (ref 0.7–4.0)
MCH: 30.5 pg (ref 26.0–34.0)
MCHC: 33.4 g/dL (ref 30.0–36.0)
MCV: 91.3 fL (ref 80.0–100.0)
Monocytes Absolute: 0.6 10*3/uL (ref 0.1–1.0)
Monocytes Relative: 11 %
Neutro Abs: 4.1 10*3/uL (ref 1.7–7.7)
Neutrophils Relative %: 76 %
Platelets: 123 10*3/uL — ABNORMAL LOW (ref 150–400)
RBC: 5.31 MIL/uL (ref 4.22–5.81)
RDW: 13.8 % (ref 11.5–15.5)
WBC: 5.4 10*3/uL (ref 4.0–10.5)
nRBC: 0 % (ref 0.0–0.2)

## 2021-12-06 LAB — COMPREHENSIVE METABOLIC PANEL
ALT: 21 U/L (ref 0–44)
AST: 18 U/L (ref 15–41)
Albumin: 4.3 g/dL (ref 3.5–5.0)
Alkaline Phosphatase: 55 U/L (ref 38–126)
Anion gap: 7 (ref 5–15)
BUN: 19 mg/dL (ref 8–23)
CO2: 26 mmol/L (ref 22–32)
Calcium: 9.4 mg/dL (ref 8.9–10.3)
Chloride: 106 mmol/L (ref 98–111)
Creatinine, Ser: 1.4 mg/dL — ABNORMAL HIGH (ref 0.61–1.24)
GFR, Estimated: 56 mL/min — ABNORMAL LOW (ref >60)
Glucose, Bld: 101 mg/dL — ABNORMAL HIGH (ref 70–99)
Potassium: 4.2 mmol/L (ref 3.5–5.1)
Sodium: 139 mmol/L (ref 135–145)
Total Bilirubin: 0.6 mg/dL (ref 0.3–1.2)
Total Protein: 7.1 g/dL (ref 6.5–8.1)

## 2021-12-06 MED ORDER — APIXABAN 5 MG PO TABS: 5.0000 mg | ORAL_TABLET | Freq: Two times a day (BID) | ORAL | Status: DC

## 2021-12-06 MED ORDER — APIXABAN 5 MG PO TABS: 10.0000 mg | ORAL_TABLET | Freq: Two times a day (BID) | ORAL | Status: DC

## 2021-12-06 MED ORDER — RIVAROXABAN (XARELTO) VTE STARTER PACK (15 & 20 MG)
ORAL_TABLET | ORAL | 0 refills | Status: DC
  Filled 2021-12-06 (×2): qty 51, 30d supply, fill #0

## 2021-12-06 MED ORDER — RIVAROXABAN 15 MG PO TABS
15.0000 mg | ORAL_TABLET | Freq: Two times a day (BID) | ORAL | Status: DC
  Administered 2021-12-06: 15 mg via ORAL
  Filled 2021-12-06: qty 1

## 2021-12-06 MED ORDER — RIVAROXABAN 20 MG PO TABS: 20.0000 mg | ORAL_TABLET | Freq: Every day | ORAL | Status: DC

## 2021-12-06 NOTE — ED Provider Notes (Signed)
[facility] DEPT Provider Note   CSN: [id number] Arrival date & time: 12/06/21  1027     History PE, DVT Chief Complaint  Patient presents with   DVT    Juan Snow is a 66 y.o. male.  66 year old male with a past medical history of pulmonary embolisms, DVTs presents to the ED with a chief complaint of leg pain.  Patient was evaluated in the ED yesterday, unfortunately were unable to obtain DVT study then, he returns today for imaging.  He does report approximately 4 days ago began to have a posterior left knee pain, noted his left leg to be swollen.  He reports playing golf for an extensive amount of time yesterday, also was voluntarily discharged therefore he was up on his feet all day.  He noticed his leg to persistently swell.  He was previously on Eliquis for pulmonary embolisms.  He denies any chest pain, no shortness of breath, no other complaints.  The history is provided by the patient and medical records.       Home Medications Prior to Admission medications   Medication Sig Start Date End Date Taking? Authorizing Provider  Ascorbic Acid (VITAMIN C PO) Take 1 tablet by mouth daily.   Yes [provider]  Cholecalciferol (VITAMIN D-3 PO) Take 4 tablets by mouth daily.   Yes [provider]  Coenzyme Q10 (CO Q-10 PO) Take 1 capsule by mouth daily.   Yes [provider]  famotidine-calcium carbonate-magnesium hydroxide (PEPCID COMPLETE) 10-800-165 MG chewable tablet Chew 1-2 tablets by mouth 2 (two) times daily as needed (acid reflux).   Yes [provider]  Menaquinone-7 (VITAMIN K2 PO) Take 1 tablet by mouth daily.   Yes [provider]  Omega-3 Fatty Acids (FISH OIL) 1000 MG CAPS Take 1 capsule (1,000 mg total) by mouth daily. 05/13/21  Yes Icard, Bradley L, DO  OVER THE COUNTER MEDICATION Take 15 mLs by mouth daily. Black Seed Oil.   Yes [provider]  pantoprazole (PROTONIX) 40 MG  tablet Take 1 tablet (40 mg total) by mouth daily. Patient taking differently: Take 40 mg by mouth daily as needed (acid reflux). 03/31/21  Yes [name], MD  RIVAROXABAN [employer]) VTE STARTER PACK (15 & 20 MG) Follow package directions: Take one '15mg'$  tablet by mouth twice a day. On day 22, switch to one '20mg'$  tablet once a day. Take with food. 12/06/21  Yes Zaahir Pickney, Beverley Fiedler, PA-C  apixaban (ELIQUIS) 5 MG TABS tablet Take 2 tablets (10 mg total) by mouth 2 (two) times daily. '10mg'$  two times day for 6  more days, Then decrease to '5mg'$  two times daily Patient not taking: Reported on 12/06/2021 05/13/21   [name], NP  atorvastatin (LIPITOR) 10 MG tablet Take 1 tablet (10 mg total) by mouth daily. Patient not taking: Reported on 12/06/2021 06/29/21         Allergies    Patient has no known allergies.    Review of Systems   Review of Systems  Constitutional:  Negative for chills and fever.  HENT:  Negative for sore throat.   Respiratory:  Negative for shortness of breath.   Cardiovascular:  Positive for leg swelling. Negative for chest pain.  Gastrointestinal:  Negative for abdominal pain, nausea and vomiting.  Genitourinary:  Negative for flank pain.  Neurological:  Negative for light-headedness and headaches.  All other systems reviewed and are negative.   Physical Exam Updated Vital Signs BP (!) 131/91  Pulse (!) 51   Temp (!) 97.5 F (36.4 C) (Oral)   Resp (!) 23   Ht '5\' 7"'$  (1.702 m)   Wt 85.3 kg   SpO2 96%   BMI 29.44 kg/m  Physical Exam Vitals and nursing note reviewed.  Constitutional:      Appearance: Normal appearance.  HENT:     Head: Normocephalic and atraumatic.     Mouth/Throat:     Mouth: Mucous membranes are moist.  Eyes:     Pupils: Pupils are equal, round, and reactive to light.  Cardiovascular:     Pulses:          Dorsalis pedis pulses are 2+ on the right side and 2+ on the left side.       Posterior tibial pulses are 2+ on the right side and 2+  on the left side.  Pulmonary:     Effort: Pulmonary effort is normal.     Breath sounds: Normal breath sounds. No wheezing or rales.  Abdominal:     General: Abdomen is flat.  Musculoskeletal:     Cervical back: Normal range of motion.     Right lower leg: No edema.     Left lower leg: 2+ Edema present.  Skin:    General: Skin is warm and dry.  Neurological:     Mental Status: He is alert and oriented to person, place, and time.     ED Results / Procedures / Treatments   Labs (all labs ordered are listed, but only abnormal results are displayed) Labs Reviewed  CBC WITH DIFFERENTIAL/PLATELET - Abnormal; Notable for the following components:      Result Value   Platelets 123 (*)    Lymphs Abs 0.5 (*)    All other components within normal limits  COMPREHENSIVE METABOLIC PANEL - Abnormal; Notable for the following components:   Glucose, Bld 101 (*)    Creatinine, Ser 1.40 (*)    GFR, Estimated 56 (*)    All other components within normal limits    EKG None  Radiology VAS [place] LOWER EXTREMITY VENOUS (DVT)  Result Date: 12/06/2021  Lower Venous DVT Study Patient Name:  Juan Snow  Date of Exam:   12/06/2021 Medical Rec #: [id number]        Accession #:    [id number] Date of Birth: [birth year]         Patient Gender: M Patient Age:   36 years Exam Location:  [facility] Procedure:      VAS [place] LOWER EXTREMITY VENOUS (DVT) Referring Phys: Domenic Moras --------------------------------------------------------------------------------  Indications: Pain, and Swelling.  Comparison Study: No prior studies. Performing Technologist: Oliver Hum RVT  Examination Guidelines: A complete evaluation includes B-mode imaging, spectral Doppler, color Doppler, and power Doppler as needed of all accessible portions of each vessel. Bilateral testing is considered an integral part of a complete examination. Limited examinations for reoccurring indications may be performed as noted. The reflux  portion of the exam is performed with the patient in reverse Trendelenburg.  +-----+---------------+---------+-----------+----------+--------------+ RIGHTCompressibilityPhasicitySpontaneityPropertiesThrombus Aging +-----+---------------+---------+-----------+----------+--------------+ CFV  Full           Yes      Yes                                 +-----+---------------+---------+-----------+----------+--------------+   +---------+---------------+---------+-----------+----------+--------------+ LEFT     CompressibilityPhasicitySpontaneityPropertiesThrombus Aging +---------+---------------+---------+-----------+----------+--------------+ CFV      Full  Yes      Yes                                 +---------+---------------+---------+-----------+----------+--------------+ SFJ      Full                                                        +---------+---------------+---------+-----------+----------+--------------+ FV Prox  Partial        Yes      Yes                  Acute          +---------+---------------+---------+-----------+----------+--------------+ FV Mid   None           No       No                   Acute          +---------+---------------+---------+-----------+----------+--------------+ FV DistalNone           No       No                   Acute          +---------+---------------+---------+-----------+----------+--------------+ PFV      Full                                                        +---------+---------------+---------+-----------+----------+--------------+ POP      None           No       No                   Acute          +---------+---------------+---------+-----------+----------+--------------+ PTV      None                                         Acute          +---------+---------------+---------+-----------+----------+--------------+ PERO     None                                         Acute           +---------+---------------+---------+-----------+----------+--------------+ Gastroc  None                                         Acute          +---------+---------------+---------+-----------+----------+--------------+     Summary: RIGHT: - No evidence of common femoral vein obstruction.  LEFT: - Findings consistent with acute deep vein thrombosis involving the left femoral vein, left popliteal vein, left posterior tibial veins, left peroneal veins, and left gastrocnemius veins. - No cystic structure found in the popliteal fossa.  *See table(s) above for measurements and observations.  Preliminary     Procedures Procedures    Medications Ordered in ED Medications  Rivaroxaban (XARELTO) tablet 15 mg (15 mg Oral Given 12/06/21 1120)    Followed by  rivaroxaban (XARELTO) tablet 20 mg (has no administration in time range)    ED Course/ Medical Decision Making/ A&P                           Medical Decision Making Amount and/or Complexity of Data Reviewed Labs: ordered.  Risk Prescription drug management.   This patient presents to the ED for concern of leg swelling, this involves a number of treatment options, and is a complaint that carries with it a high risk of complications and morbidity. Patient to rule out blood clot.   Co morbidities: Discussed in HPI   Brief History:  Patient is here to rule out DVT.  Had ultrasound which showed extensive DVT acute to the left leg.  He does have a prior history of DVT, did take Eliquis in the past for approximately 1 month but then discontinued this.  He was hospitalized for a prior pulmonary embolism.  Currently on no blood thinners.  No recent surgeries.  EMR reviewed including pt PMHx, past surgical history and past visits to ER.   See HPI for more details   Lab Tests:  I ordered and independently interpreted labs.  The pertinent results include:    I personally reviewed all laboratory work and imaging. Metabolic  panel without any acute abnormality specifically kidney function within normal limits and no significant electrolyte abnormalities. CBC without leukocytosis or significant anemia.   Imaging Studies:  DVT study showed: - Findings consistent with acute deep vein thrombosis involving the left  femoral vein, left popliteal vein, left posterior tibial veins, left  peroneal veins, and left gastrocnemius veins.  - No cystic structure found in the popliteal fossa.   Medicines ordered:  I ordered medication including Xarelto  for treatment Reevaluation of the patient after these medicines showed that the patient stayed the same I have reviewed the patients home medicines and have made adjustments as needed   Reevaluation:  After the interventions noted above I re-evaluated patient and found that they have :stayed the same   Social Determinants of Health:  The patient's social determinants of health were a factor in the care of this patient    Problem List / ED Course:  Patient here with acute DVT involving the left femoral vein, left popliteal vein, left posterior tibial vein, left peroneal vein and left gastroc vein.  These results were discussed with patient.  He does have prior history of pulmonary embolism however on today's visit is not having any chest pain, no shortness of breath, no hypoxia or tachycardia on evaluation.  He does report pain along the left lower leg.  2+ pitting edema noted to the left leg.  Good pulses, good sensation intact throughout but swelling to the left leg.  I discussed results with patient, I did get pharmacy involved in order to try to provide patient with a cheaper version of a blood thinner, however he does not qualify for a free pack of Eliquis or Xarelto, Xarelto was ultimately chosen and patient was given a prescription for the [employer] in order to obtain a financial break with payment. Did consider scanning chest to rule out pulmonary  embolism however patient does not have any symptoms again today without no shortness of breath, no chest  pain, no hypoxia or tachycardia.  He does have a good reassuring exam, I did discuss appropriate follow-up with his PCP, I do suspect the patient will likely need to continue taking Xarelto past 30 days and for his entire life.  Patient is agreeable of plan and treatment, patient is stable for discharge.   Dispostion:  After consideration of the diagnostic results and the patients response to treatment, I feel that the patent would benefit from follow up with PCP will need to continue with xarelto therapy.      Portions of this note were generated with [employer]. Dictation errors may occur despite best attempts at proofreading.   Final Clinical Impression(s) / ED Diagnoses Final diagnoses:  Acute deep vein thrombosis (DVT) of popliteal vein of left lower extremity ([place])    Rx / DC Orders ED Discharge Orders          Ordered    RIVAROXABAN (XARELTO) VTE STARTER PACK (15 & 20 MG)        12/06/21 1116              [name], PA-C 12/06/21 1146    Dorie Rank, MD 12/07/21 1458

## 2021-12-06 NOTE — Progress Notes (Signed)
ANTICOAGULATION CONSULT NOTE - Initial Consult  Pharmacy Consult for Xarelto Indication: DVT  No Known Allergies  Patient Measurements: Height: '5\' 7"'$  (170.2 cm) Weight: 85.3 kg (188 lb) IBW/kg (Calculated) : 66.1 Heparin Dosing Weight:   Vital Signs: Temp: 97.5 F (36.4 C) (10/05 0924) Temp Source: Oral (10/05 0924) BP: 136/89 (10/05 1015) Pulse Rate: 50 (10/05 1015)  Labs: No results for input(s): "HGB", "HCT", "PLT", "APTT", "LABPROT", "INR", "HEPARINUNFRC", "HEPRLOWMOCWT", "CREATININE", "CKTOTAL", "CKMB", "TROPONINIHS" in the last 72 hours.  CrCl cannot be calculated (Patient's most recent lab result is older than the maximum 21 days allowed.).   Medical History: Past Medical History:  Diagnosis Date   Arthritis    OA   Cough    WHITE SPUTUM OCC, NO FEVER COLD SYMPTOMS MAINLY   GERD (gastroesophageal reflux disease)    Assessment: 66 yo M with DVT. Hx PE & DVT 05/11/2021 which required mechanical thrombectomy by IR on 05/11/2021. He was discharged on Eliquis 05/24/2021.   Pharmacy consulted for Xarelto dosing for DVT.  Lovenox 85 mg SQ x 1 given 10/4    Plan:  Xarelto 15 mg PO bid x 21 days then Xarelto 20 mg qsupper Will provide 30 day free card & educate patient  Eudelia Bunch, Pharm.D 12/06/2021 10:46 AM

## 2021-12-06 NOTE — Discharge Instructions (Addendum)
You were started on medication today to help treat the blood clot to your left leg, please take this medication as prescribed.  You will return to the emergency department if you experience any chest pain, shortness of breath, or struggling to catch her breath.  You will likely need to continue taking Xarelto past 30 days, please schedule an appointment with your primary care physician in order to continue to receive medications.

## 2021-12-06 NOTE — Progress Notes (Signed)
Left lower extremity venous duplex has been completed. Preliminary results can be found in CV Proc through chart review.  Results were given to Anson Oregon RN in the [facility] ED.  12/06/21 8:15 AM Carlos Levering RVT

## 2021-12-06 NOTE — ED Triage Notes (Signed)
Pt presents with c/o being diagnosed with a DVT in the left leg. Pt was here yesterday for leg swelling, sent for a DVT study and the results came back positive for DVT. Pt denies any pain in that leg at this time, reports hx of DVT in the same leg earlier this year. Not currently taking any blood thinners.

## 2021-12-26 ENCOUNTER — Ambulatory Visit
Admission: RE | Admit: 2021-12-26 | Discharge: 2021-12-26 | Disposition: A | Discharge status: Home / Self Care | Payer: Medicare Other | Source: Ambulatory Visit | Attending: Interventional Radiology | Admitting: Interventional Radiology

## 2021-12-26 DIAGNOSIS — I2601 Septic pulmonary embolism with acute cor pulmonale: Secondary | ICD-10-CM

## 2021-12-26 HISTORY — PX: IR RADIOLOGIST EVAL & MGMT: IMG5224

## 2021-12-26 NOTE — Progress Notes (Signed)
Reason for visit: follow up today s/p LLE DVT. Hx of submassive PE s/p thrombectomy.   Care Team(s): PCP: Lurline Del, DO  History of present illness:  66 y/o M last seen by me 4 months ago in follow up s/p thrombectomy for submassive PE on 05/11/21. Pt was initially scheduled to follow up for imaging to ensure resolution of residual LLE DVT.  At the time, he had an non-occlussive LLE DVT but had minor symptoms and elected for conservative management. He had completed his 3 mos course of AC, (Eliquis).  He reports that on 12/02/21 he experienced a "muscle pull" at his L knee, and presented to ER at [facility] where he was diagnosed with new / AoC LLE DVT. He denied any SOB or CP and PE workup was deferred. He was started on [facility], and is now on Xarelto as written by his PCP.    He endorses mild left leg swelling but denies any discomfort. He is retired and continues to play golf and walks intentionally to improve his stamina. He is due for a colonoscopy in early November 2023, and is worried about being on [facility] during the procedure.  Review of Systems: A 12-point ROS discussed, and pertinent positives are indicated in the HPI above.  All other systems are negative.   Past Medical History:  Diagnosis Date   Arthritis    OA   Cough    WHITE SPUTUM OCC, NO FEVER COLD SYMPTOMS MAINLY   GERD (gastroesophageal reflux disease)     Past Surgical History:  Procedure Laterality Date   CHOLECYSTECTOMY N/A 03/30/2021   Procedure: LAPAROSCOPIC CHOLECYSTECTOMY WITH INTRAOPERATIVE CHOLANGIOGRAM;  Surgeon: Armandina Gemma, MD;  Location: [facility] ORS;  Service: General;  Laterality: N/A;   COLONOSCOPY  06/17/2011   Procedure: COLONOSCOPY;  Surgeon: [name], MD;  Location: [facility] ENDOSCOPY;  Service: Endoscopy;  Laterality: N/A;   IR ANGIOGRAM PULMONARY BILATERAL SELECTIVE  05/11/2021   IR ANGIOGRAM SELECTIVE EACH ADDITIONAL VESSEL  05/11/2021   IR ANGIOGRAM SELECTIVE EACH ADDITIONAL VESSEL  05/11/2021   IR  RADIOLOGIST EVAL & MGMT  06/06/2021   IR RADIOLOGIST EVAL & MGMT  09/06/2021   IR RADIOLOGIST EVAL & MGMT  12/26/2021   IR THROMBECT VENO MECH MOD SED  05/11/2021   IR US GUIDE VASC ACCESS RIGHT  05/11/2021   KNEE SURGERY Right YRS AGO   MENISCUS   THUMB ARTHROSCOPY Right YRS AGO   TOTAL HIP ARTHROPLASTY Left 06/25/2017   Procedure: LEFT TOTAL HIP ARTHROPLASTY ANTERIOR APPROACH;  Surgeon: Gaynelle Arabian, MD;  Location: [facility] ORS;  Service: Orthopedics;  Laterality: Left;    Allergies: Patient has no known allergies.  Medications: Prior to Admission medications   Medication Sig Start Date End Date Taking? Authorizing Provider  apixaban (ELIQUIS) 5 MG TABS tablet Take 2 tablets (10 mg total) by mouth 2 (two) times daily. '10mg'$  two times day for 6  more days, Then decrease to '5mg'$  two times daily Patient not taking: Reported on 12/06/2021 05/13/21   Juan Snow D, NP  Ascorbic Acid (VITAMIN C PO) Take 1 tablet by mouth daily.    [provider]  atorvastatin (LIPITOR) 10 MG tablet Take 1 tablet (10 mg total) by mouth daily. Patient not taking: Reported on 12/06/2021 06/29/21     Cholecalciferol (VITAMIN D-3 PO) Take 4 tablets by mouth daily.    [provider]  Coenzyme Q10 (CO Q-10 PO) Take 1 capsule by mouth daily.    [provider]  famotidine-calcium carbonate-magnesium hydroxide (PEPCID COMPLETE) 10-800-165 MG chewable tablet Chew 1-2 tablets by mouth 2 (two) times daily as needed (acid reflux).    [provider]  Menaquinone-7 (VITAMIN K2 PO) Take 1 tablet by mouth daily.    [provider]  Omega-3 Fatty Acids (FISH OIL) 1000 MG CAPS Take 1 capsule (1,000 mg total) by mouth daily. 05/13/21   Icard, [name], DO  OVER THE COUNTER MEDICATION Take 15 mLs by mouth daily. Black Seed Oil.    [provider]  pantoprazole (PROTONIX) 40 MG tablet Take 1 tablet (40 mg total) by mouth daily. Patient taking differently: Take 40 mg by mouth daily as  needed (acid reflux). 03/31/21   Juan Cousins, MD  RIVAROXABAN Alveda Reasons) VTE STARTER PACK (15 & 20 MG) Follow package directions: Take one '15mg'$  tablet by mouth twice a day. On day 22, switch to one '20mg'$  tablet once a day. Take with food. 12/06/21   [name], PA-C     Family History  Problem Relation Age of Onset   CAD Brother    Hypertension Other    Diabetes Neg Hx     Social History   Socioeconomic History   Marital status: Married    Spouse name: Not on file   Number of children: Not on file   Years of education: Not on file   Highest education level: Not on file  Occupational History   Not on file  Tobacco Use   Smoking status: Never   Smokeless tobacco: Never  Vaping Use   Vaping Use: Never used  Substance and Sexual Activity   Alcohol use: No   Drug use: No   Sexual activity: Not on file  Other Topics Concern   Not on file  Social History Narrative   Not on file   Social Determinants of Health   Financial Resource Strain: Not on file  Food Insecurity: Not on file  Transportation Needs: Not on file  Physical Activity: Not on file  Stress: Not on file  Social Connections: Not on file    Vital Signs: There were no vitals taken for this visit.  Physical Exam  General: WN, NAD  CV: RRR on monitor Pulm: normal work of breathing on RA Abd: S, ND, NT MSK: Grossly normal. mild LLE edema. Psych: Appropriate affect.    Imaging: IR Radiologist Eval & Mgmt  Result Date: 12/26/2021 EXAM: ESTABLISHED PATIENT OFFICE VISIT CHIEF COMPLAINT: See below HISTORY OF PRESENT ILLNESS: See below REVIEW OF SYSTEMS: See below PHYSICAL EXAMINATION: See below ASSESSMENT AND PLAN: Please refer to completed note in the electronic medical record on [name] Electronically Signed   By: [name] M.D.   On: 12/26/2021 09:46   US Venous Img Lower Unilateral Left (DVT)  Result Date: 12/26/2021 CLINICAL DATA:  Follow-up history of LEFT lower extremity DVT and  PE. New complaint of LEFT leg swelling. EXAM: LEFT LOWER EXTREMITY VENOUS DOPPLER ULTRASOUND TECHNIQUE: Gray-scale sonography with compression, as well as color and duplex ultrasound, were performed to evaluate the deep venous system(s) from the level of the common femoral vein through the popliteal and proximal calf veins. COMPARISON:  LEFT lower extremity venous duplex, 09/06/2021. CT venogram, 05/11/2021. FINDINGS: VENOUS Normal compressibility of the common femoral vein. Visualized portions of profunda femoral vein and great saphenous vein unremarkable. New heterogeneously hypoechoic filling defect involving the previously-patent LEFT femoral vein, nonocclusive involving its mid-to-distal portion and with extension from the imaged portions of the LEFT popliteal,  PTV and peroneal veins. See key images. Additionally, there appears to be hyperechoic, eccentric and chronic-appearing thrombi within the distal popliteal vein. Limited views of the contralateral common femoral vein are unremarkable. OTHER No evidence of superficial thrombophlebitis or abnormal fluid collection. Limitations: none IMPRESSION: Examination is POSITIVE for acute/subacute LEFT femoropopliteal DVT, superimposed within a background of remodeling and chronic-appearing residual DVT within the LEFT popliteal vein, as described above [name], MD [facility]iology Electronically Signed   By: [name] M.D.   On: 12/26/2021 09:40     Assessment and Plan:  66 y/o M w PMHx significant for submassive PE and occlussive LLE DVT, initially scheduled for post thrombectomy for residual LLE DVT followup. Pt recently diagnosed with new, AoC LLE DVT on Xarelto.  12/26/21 Venous Duplex POSITIVE for acute/subacute LEFT femoropopliteal DVT, superimposed within a background of remodeling and chronic-appearing residual DVT within the LEFT popliteal vein    Mild LLE edema on exam. Asymptomatic from  both cardiopulmonary and LLE DVT perspectives. Villalta Score for Severity of Post-thrombotic Syndrome: 0 Pts, not present    *AC course (Xarelto) per PCP. May consider extended vs indefinite course of Rx given recurrence. *VCS = 0, asymptomatic. No intervention for LLE DVT *reinforced recommendedation for compression stocking to augment venous pump *Physical activity as tolerated *follow up repeat LLE venous duplex in 3 months, until resolution. No clinic visit, unless Pt request.  Thank you for allowing [place] to participate in the care of your Patient.   Electronically Signed:  [name], MD [facility] Radiology   Pager. [phone] Clinic. 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

## 2022-02-27 ENCOUNTER — Other Ambulatory Visit: Payer: Self-pay | Admitting: Interventional Radiology

## 2022-02-27 DIAGNOSIS — I2601 Septic pulmonary embolism with acute cor pulmonale: Secondary | ICD-10-CM

## 2022-03-10 ENCOUNTER — Encounter (HOSPITAL_COMMUNITY)
Admission: EM | Disposition: A | Discharge status: Home Health | Payer: Self-pay | Source: Home / Self Care | Attending: Thoracic Surgery (Cardiothoracic Vascular Surgery)

## 2022-03-10 ENCOUNTER — Inpatient Hospital Stay (HOSPITAL_COMMUNITY): Payer: Medicare Other

## 2022-03-10 ENCOUNTER — Emergency Department (HOSPITAL_COMMUNITY): Payer: Medicare Other

## 2022-03-10 ENCOUNTER — Inpatient Hospital Stay (HOSPITAL_COMMUNITY)
Admission: EM | Admit: 2022-03-10 | Discharge: 2022-03-28 | DRG: 853 | Disposition: A | Discharge status: Home Health | Payer: Medicare Other | Attending: Thoracic Surgery (Cardiothoracic Vascular Surgery) | Admitting: Thoracic Surgery (Cardiothoracic Vascular Surgery)

## 2022-03-10 ENCOUNTER — Encounter (HOSPITAL_COMMUNITY): Payer: Self-pay

## 2022-03-10 ENCOUNTER — Other Ambulatory Visit: Payer: Self-pay

## 2022-03-10 DIAGNOSIS — I472 Ventricular tachycardia, unspecified: Secondary | ICD-10-CM | POA: Diagnosis present

## 2022-03-10 DIAGNOSIS — Z86711 Personal history of pulmonary embolism: Secondary | ICD-10-CM

## 2022-03-10 DIAGNOSIS — I2102 ST elevation (STEMI) myocardial infarction involving left anterior descending coronary artery: Secondary | ICD-10-CM | POA: Diagnosis not present

## 2022-03-10 DIAGNOSIS — R570 Cardiogenic shock: Secondary | ICD-10-CM | POA: Diagnosis not present

## 2022-03-10 DIAGNOSIS — N390 Urinary tract infection, site not specified: Secondary | ICD-10-CM | POA: Diagnosis not present

## 2022-03-10 DIAGNOSIS — D696 Thrombocytopenia, unspecified: Secondary | ICD-10-CM | POA: Diagnosis present

## 2022-03-10 DIAGNOSIS — I2510 Atherosclerotic heart disease of native coronary artery without angina pectoris: Secondary | ICD-10-CM | POA: Diagnosis not present

## 2022-03-10 DIAGNOSIS — I255 Ischemic cardiomyopathy: Secondary | ICD-10-CM | POA: Diagnosis present

## 2022-03-10 DIAGNOSIS — Z79899 Other long term (current) drug therapy: Secondary | ICD-10-CM

## 2022-03-10 DIAGNOSIS — D62 Acute posthemorrhagic anemia: Secondary | ICD-10-CM | POA: Diagnosis not present

## 2022-03-10 DIAGNOSIS — E785 Hyperlipidemia, unspecified: Secondary | ICD-10-CM | POA: Diagnosis present

## 2022-03-10 DIAGNOSIS — J9601 Acute respiratory failure with hypoxia: Secondary | ICD-10-CM | POA: Diagnosis not present

## 2022-03-10 DIAGNOSIS — Z951 Presence of aortocoronary bypass graft: Secondary | ICD-10-CM | POA: Diagnosis not present

## 2022-03-10 DIAGNOSIS — I509 Heart failure, unspecified: Secondary | ICD-10-CM | POA: Diagnosis not present

## 2022-03-10 DIAGNOSIS — R197 Diarrhea, unspecified: Secondary | ICD-10-CM | POA: Diagnosis not present

## 2022-03-10 DIAGNOSIS — J90 Pleural effusion, not elsewhere classified: Secondary | ICD-10-CM | POA: Diagnosis not present

## 2022-03-10 DIAGNOSIS — I252 Old myocardial infarction: Secondary | ICD-10-CM | POA: Diagnosis not present

## 2022-03-10 DIAGNOSIS — I2109 ST elevation (STEMI) myocardial infarction involving other coronary artery of anterior wall: Secondary | ICD-10-CM | POA: Diagnosis present

## 2022-03-10 DIAGNOSIS — I213 ST elevation (STEMI) myocardial infarction of unspecified site: Secondary | ICD-10-CM | POA: Diagnosis present

## 2022-03-10 DIAGNOSIS — Z8249 Family history of ischemic heart disease and other diseases of the circulatory system: Secondary | ICD-10-CM

## 2022-03-10 DIAGNOSIS — B965 Pseudomonas (aeruginosa) (mallei) (pseudomallei) as the cause of diseases classified elsewhere: Secondary | ICD-10-CM | POA: Diagnosis not present

## 2022-03-10 DIAGNOSIS — Z7901 Long term (current) use of anticoagulants: Secondary | ICD-10-CM

## 2022-03-10 DIAGNOSIS — M19079 Primary osteoarthritis, unspecified ankle and foot: Secondary | ICD-10-CM

## 2022-03-10 DIAGNOSIS — N179 Acute kidney failure, unspecified: Secondary | ICD-10-CM | POA: Diagnosis not present

## 2022-03-10 DIAGNOSIS — M109 Gout, unspecified: Secondary | ICD-10-CM | POA: Diagnosis present

## 2022-03-10 DIAGNOSIS — R140 Abdominal distension (gaseous): Secondary | ICD-10-CM | POA: Diagnosis not present

## 2022-03-10 DIAGNOSIS — M1990 Unspecified osteoarthritis, unspecified site: Secondary | ICD-10-CM | POA: Diagnosis present

## 2022-03-10 DIAGNOSIS — I5023 Acute on chronic systolic (congestive) heart failure: Secondary | ICD-10-CM | POA: Diagnosis present

## 2022-03-10 DIAGNOSIS — I5021 Acute systolic (congestive) heart failure: Secondary | ICD-10-CM | POA: Insufficient documentation

## 2022-03-10 DIAGNOSIS — R739 Hyperglycemia, unspecified: Secondary | ICD-10-CM | POA: Diagnosis not present

## 2022-03-10 DIAGNOSIS — I25110 Atherosclerotic heart disease of native coronary artery with unstable angina pectoris: Secondary | ICD-10-CM | POA: Diagnosis present

## 2022-03-10 DIAGNOSIS — A4152 Sepsis due to Pseudomonas: Principal | ICD-10-CM | POA: Diagnosis not present

## 2022-03-10 DIAGNOSIS — R7881 Bacteremia: Secondary | ICD-10-CM | POA: Diagnosis not present

## 2022-03-10 DIAGNOSIS — I471 Supraventricular tachycardia, unspecified: Secondary | ICD-10-CM | POA: Diagnosis present

## 2022-03-10 DIAGNOSIS — J9811 Atelectasis: Secondary | ICD-10-CM | POA: Diagnosis not present

## 2022-03-10 DIAGNOSIS — U071 COVID-19: Secondary | ICD-10-CM | POA: Diagnosis present

## 2022-03-10 DIAGNOSIS — Z96642 Presence of left artificial hip joint: Secondary | ICD-10-CM | POA: Diagnosis present

## 2022-03-10 DIAGNOSIS — I4891 Unspecified atrial fibrillation: Secondary | ICD-10-CM | POA: Diagnosis not present

## 2022-03-10 DIAGNOSIS — E871 Hypo-osmolality and hyponatremia: Secondary | ICD-10-CM | POA: Diagnosis not present

## 2022-03-10 DIAGNOSIS — T380X5A Adverse effect of glucocorticoids and synthetic analogues, initial encounter: Secondary | ICD-10-CM | POA: Diagnosis not present

## 2022-03-10 DIAGNOSIS — E876 Hypokalemia: Secondary | ICD-10-CM | POA: Diagnosis not present

## 2022-03-10 DIAGNOSIS — M79671 Pain in right foot: Secondary | ICD-10-CM | POA: Diagnosis not present

## 2022-03-10 DIAGNOSIS — K219 Gastro-esophageal reflux disease without esophagitis: Secondary | ICD-10-CM | POA: Diagnosis present

## 2022-03-10 DIAGNOSIS — I493 Ventricular premature depolarization: Secondary | ICD-10-CM | POA: Diagnosis present

## 2022-03-10 DIAGNOSIS — I214 Non-ST elevation (NSTEMI) myocardial infarction: Secondary | ICD-10-CM | POA: Diagnosis not present

## 2022-03-10 DIAGNOSIS — R7303 Prediabetes: Secondary | ICD-10-CM | POA: Diagnosis not present

## 2022-03-10 DIAGNOSIS — I5020 Unspecified systolic (congestive) heart failure: Secondary | ICD-10-CM | POA: Insufficient documentation

## 2022-03-10 DIAGNOSIS — R079 Chest pain, unspecified: Secondary | ICD-10-CM | POA: Diagnosis not present

## 2022-03-10 DIAGNOSIS — N1831 Chronic kidney disease, stage 3a: Secondary | ICD-10-CM | POA: Diagnosis present

## 2022-03-10 HISTORY — PX: IABP INSERTION: CATH118242

## 2022-03-10 HISTORY — PX: CORONARY/GRAFT ACUTE MI REVASCULARIZATION: CATH118305

## 2022-03-10 HISTORY — PX: LEFT HEART CATH AND CORONARY ANGIOGRAPHY: CATH118249

## 2022-03-10 LAB — CBC WITH DIFFERENTIAL/PLATELET
Abs Immature Granulocytes: 0.06 10*3/uL (ref 0.00–0.07)
Basophils Absolute: 0 10*3/uL (ref 0.0–0.1)
Basophils Relative: 0 %
Eosinophils Absolute: 0.1 10*3/uL (ref 0.0–0.5)
Eosinophils Relative: 1 %
HCT: 52.3 % — ABNORMAL HIGH (ref 39.0–52.0)
Hemoglobin: 17.5 g/dL — ABNORMAL HIGH (ref 13.0–17.0)
Immature Granulocytes: 1 %
Lymphocytes Relative: 17 %
Lymphs Abs: 1.3 10*3/uL (ref 0.7–4.0)
MCH: 30.4 pg (ref 26.0–34.0)
MCHC: 33.5 g/dL (ref 30.0–36.0)
MCV: 91 fL (ref 80.0–100.0)
Monocytes Absolute: 1.3 10*3/uL — ABNORMAL HIGH (ref 0.1–1.0)
Monocytes Relative: 17 %
Neutro Abs: 4.9 10*3/uL (ref 1.7–7.7)
Neutrophils Relative %: 64 %
Platelets: 189 10*3/uL (ref 150–400)
RBC: 5.75 MIL/uL (ref 4.22–5.81)
RDW: 13.8 % (ref 11.5–15.5)
WBC: 7.6 10*3/uL (ref 4.0–10.5)
nRBC: 0 % (ref 0.0–0.2)

## 2022-03-10 LAB — COMPREHENSIVE METABOLIC PANEL
ALT: 24 U/L (ref 0–44)
AST: 26 U/L (ref 15–41)
Albumin: 3.7 g/dL (ref 3.5–5.0)
Alkaline Phosphatase: 48 U/L (ref 38–126)
Anion gap: 16 — ABNORMAL HIGH (ref 5–15)
BUN: 20 mg/dL (ref 8–23)
CO2: 18 mmol/L — ABNORMAL LOW (ref 22–32)
Calcium: 9.1 mg/dL (ref 8.9–10.3)
Chloride: 105 mmol/L (ref 98–111)
Creatinine, Ser: 1.88 mg/dL — ABNORMAL HIGH (ref 0.61–1.24)
GFR, Estimated: 39 mL/min — ABNORMAL LOW (ref >60)
Glucose, Bld: 130 mg/dL — ABNORMAL HIGH (ref 70–99)
Potassium: 3.1 mmol/L — ABNORMAL LOW (ref 3.5–5.1)
Sodium: 139 mmol/L (ref 135–145)
Total Bilirubin: 0.5 mg/dL (ref 0.3–1.2)
Total Protein: 7 g/dL (ref 6.5–8.1)

## 2022-03-10 LAB — LIPID PANEL
Cholesterol: 210 mg/dL — ABNORMAL HIGH (ref 0–200)
HDL: 41 mg/dL (ref >40)
LDL Cholesterol: 121 mg/dL — ABNORMAL HIGH (ref 0–99)
Total CHOL/HDL Ratio: 5.1 RATIO
Triglycerides: 239 mg/dL — ABNORMAL HIGH (ref <150)
VLDL: 48 mg/dL — ABNORMAL HIGH (ref 0–40)

## 2022-03-10 LAB — APTT
aPTT: 44 seconds — ABNORMAL HIGH (ref 24–36)
aPTT: >200 seconds (ref 24–36)

## 2022-03-10 LAB — TROPONIN I (HIGH SENSITIVITY)
Troponin I (High Sensitivity): 153 ng/L (ref <18)
Troponin I (High Sensitivity): >24000 ng/L (ref <18)

## 2022-03-10 LAB — PROTIME-INR
INR: 1.4 — ABNORMAL HIGH (ref 0.8–1.2)
Prothrombin Time: 16.6 seconds — ABNORMAL HIGH (ref 11.4–15.2)

## 2022-03-10 LAB — POCT ACTIVATED CLOTTING TIME: Activated Clotting Time: 363 seconds

## 2022-03-10 SURGERY — CORONARY/GRAFT ACUTE MI REVASCULARIZATION
Anesthesia: LOCAL

## 2022-03-10 SURGERY — IABP INSERTION
Anesthesia: LOCAL

## 2022-03-10 MED ORDER — SODIUM CHLORIDE 0.9 % IV SOLN: INTRAVENOUS | Status: AC

## 2022-03-10 MED ORDER — MIDAZOLAM HCL 2 MG/2ML IJ SOLN
INTRAMUSCULAR | Status: DC | PRN
  Administered 2022-03-10: 1 mg via INTRAVENOUS

## 2022-03-10 MED ORDER — HEPARIN (PORCINE) 25000 UT/250ML-% IV SOLN: 1200.0000 [IU]/h | INTRAVENOUS | Status: DC

## 2022-03-10 MED ORDER — HEPARIN SODIUM (PORCINE) 5000 UNIT/ML IJ SOLN
INTRAMUSCULAR | Status: AC
  Administered 2022-03-10: 4000 [IU] via INTRAVENOUS
  Filled 2022-03-10: qty 1

## 2022-03-10 MED ORDER — SODIUM CHLORIDE 0.9% FLUSH: 3.0000 mL | INTRAVENOUS | Status: DC | PRN

## 2022-03-10 MED ORDER — METOPROLOL TARTRATE 12.5 MG HALF TABLET: 12.5000 mg | ORAL_TABLET | Freq: Once | ORAL | Status: DC

## 2022-03-10 MED ORDER — SODIUM CHLORIDE 0.9 % IV SOLN: 250.0000 mL | INTRAVENOUS | Status: DC | PRN

## 2022-03-10 MED ORDER — HEPARIN (PORCINE) IN NACL 1000-0.9 UT/500ML-% IV SOLN
INTRAVENOUS | Status: AC
  Filled 2022-03-10: qty 500

## 2022-03-10 MED ORDER — LIDOCAINE HCL (PF) 1 % IJ SOLN
INTRAMUSCULAR | Status: AC
  Filled 2022-03-10: qty 30

## 2022-03-10 MED ORDER — LIDOCAINE HCL (PF) 1 % IJ SOLN
INTRAMUSCULAR | Status: DC | PRN
  Administered 2022-03-10: 2 mL

## 2022-03-10 MED ORDER — VERAPAMIL HCL 2.5 MG/ML IV SOLN
INTRAVENOUS | Status: AC
  Filled 2022-03-10: qty 2

## 2022-03-10 MED ORDER — AMIODARONE HCL IN DEXTROSE 360-4.14 MG/200ML-% IV SOLN
60.0000 mg/h | INTRAVENOUS | Status: AC
  Administered 2022-03-10: 60 mg/h via INTRAVENOUS
  Filled 2022-03-10: qty 200

## 2022-03-10 MED ORDER — MORPHINE SULFATE (PF) 2 MG/ML IV SOLN
1.0000 mg | INTRAVENOUS | Status: DC | PRN
  Administered 2022-03-10 (×2): 1 mg via INTRAVENOUS
  Filled 2022-03-10 (×2): qty 1

## 2022-03-10 MED ORDER — PANTOPRAZOLE SODIUM 40 MG PO TBEC
40.0000 mg | DELAYED_RELEASE_TABLET | Freq: Every day | ORAL | Status: DC
  Administered 2022-03-11: 40 mg via ORAL
  Filled 2022-03-10: qty 1

## 2022-03-10 MED ORDER — HYDRALAZINE HCL 20 MG/ML IJ SOLN: 10.0000 mg | INTRAMUSCULAR | Status: AC | PRN

## 2022-03-10 MED ORDER — HEPARIN SODIUM (PORCINE) 1000 UNIT/ML IJ SOLN
INTRAMUSCULAR | Status: DC | PRN
  Administered 2022-03-10: 5000 [IU] via INTRAVENOUS

## 2022-03-10 MED ORDER — NITROGLYCERIN 0.4 MG SL SUBL
0.4000 mg | SUBLINGUAL_TABLET | SUBLINGUAL | Status: DC | PRN
  Administered 2022-03-10 – 2022-03-11 (×4): 0.4 mg via SUBLINGUAL
  Filled 2022-03-10: qty 1

## 2022-03-10 MED ORDER — LABETALOL HCL 5 MG/ML IV SOLN: 10.0000 mg | INTRAVENOUS | Status: AC | PRN

## 2022-03-10 MED ORDER — IOHEXOL 350 MG/ML SOLN
INTRAVENOUS | Status: DC | PRN
  Administered 2022-03-10: 115 mL via INTRA_ARTERIAL

## 2022-03-10 MED ORDER — MIDAZOLAM HCL 2 MG/2ML IJ SOLN
INTRAMUSCULAR | Status: AC
  Filled 2022-03-10: qty 2

## 2022-03-10 MED ORDER — PANTOPRAZOLE SODIUM 20 MG PO TBEC
20.0000 mg | DELAYED_RELEASE_TABLET | Freq: Every day | ORAL | Status: DC
  Administered 2022-03-10: 20 mg via ORAL
  Filled 2022-03-10: qty 1

## 2022-03-10 MED ORDER — ALUM & MAG HYDROXIDE-SIMETH 200-200-20 MG/5ML PO SUSP
30.0000 mL | Freq: Four times a day (QID) | ORAL | Status: DC | PRN
  Administered 2022-03-10: 30 mL via ORAL
  Filled 2022-03-10: qty 30

## 2022-03-10 MED ORDER — AMIODARONE IV BOLUS ONLY 150 MG/100ML
INTRAVENOUS | Status: AC
  Filled 2022-03-10: qty 100

## 2022-03-10 MED ORDER — ASPIRIN 81 MG PO CHEW
81.0000 mg | CHEWABLE_TABLET | Freq: Every day | ORAL | Status: DC
  Administered 2022-03-11: 81 mg via ORAL
  Filled 2022-03-10: qty 1

## 2022-03-10 MED ORDER — NITROGLYCERIN 0.4 MG SL SUBL
SUBLINGUAL_TABLET | SUBLINGUAL | Status: AC
  Administered 2022-03-10 (×2): 0.4 mg
  Filled 2022-03-10: qty 1

## 2022-03-10 MED ORDER — NITROGLYCERIN IN D5W 200-5 MCG/ML-% IV SOLN: 2.0000 ug/min | INTRAVENOUS | Status: DC

## 2022-03-10 MED ORDER — HEPARIN SODIUM (PORCINE) 1000 UNIT/ML IJ SOLN
INTRAMUSCULAR | Status: AC
  Filled 2022-03-10: qty 10

## 2022-03-10 MED ORDER — METOPROLOL SUCCINATE ER 25 MG PO TB24: 12.5000 mg | ORAL_TABLET | Freq: Every day | ORAL | Status: DC

## 2022-03-10 MED ORDER — ATORVASTATIN CALCIUM 80 MG PO TABS
80.0000 mg | ORAL_TABLET | Freq: Every day | ORAL | Status: DC
  Administered 2022-03-10 – 2022-03-28 (×18): 80 mg via ORAL
  Filled 2022-03-10 (×18): qty 1

## 2022-03-10 MED ORDER — ORAL CARE MOUTH RINSE: 15.0000 mL | OROMUCOSAL | Status: DC | PRN

## 2022-03-10 MED ORDER — SIMETHICONE 80 MG PO CHEW
80.0000 mg | CHEWABLE_TABLET | Freq: Four times a day (QID) | ORAL | Status: DC | PRN
  Administered 2022-03-10: 160 mg via ORAL
  Filled 2022-03-10: qty 2

## 2022-03-10 MED ORDER — FENTANYL CITRATE (PF) 100 MCG/2ML IJ SOLN
INTRAMUSCULAR | Status: AC
  Filled 2022-03-10: qty 2

## 2022-03-10 MED ORDER — ACETAMINOPHEN 325 MG PO TABS
650.0000 mg | ORAL_TABLET | ORAL | Status: DC | PRN
  Administered 2022-03-10 – 2022-03-11 (×2): 650 mg via ORAL
  Filled 2022-03-10 (×2): qty 2

## 2022-03-10 MED ORDER — HEPARIN (PORCINE) IN NACL 1000-0.9 UT/500ML-% IV SOLN
INTRAVENOUS | Status: DC | PRN
  Administered 2022-03-10 (×3): 500 mL

## 2022-03-10 MED ORDER — ASPIRIN 81 MG PO CHEW
324.0000 mg | CHEWABLE_TABLET | Freq: Once | ORAL | Status: AC
  Filled 2022-03-10: qty 4

## 2022-03-10 MED ORDER — HEPARIN (PORCINE) 25000 UT/250ML-% IV SOLN
750.0000 [IU]/h | INTRAVENOUS | Status: DC
  Administered 2022-03-10 (×2): 800 [IU]/h via INTRAVENOUS
  Filled 2022-03-10 (×2): qty 250

## 2022-03-10 MED ORDER — ASPIRIN 81 MG PO CHEW
CHEWABLE_TABLET | ORAL | Status: AC
  Administered 2022-03-10: 324 mg via ORAL
  Filled 2022-03-10: qty 4

## 2022-03-10 MED ORDER — POTASSIUM CHLORIDE CRYS ER 20 MEQ PO TBCR
40.0000 meq | EXTENDED_RELEASE_TABLET | Freq: Once | ORAL | Status: AC
  Administered 2022-03-10: 40 meq via ORAL
  Filled 2022-03-10: qty 2

## 2022-03-10 MED ORDER — HEPARIN (PORCINE) IN NACL 2000-0.9 UNIT/L-% IV SOLN
INTRAVENOUS | Status: AC
  Filled 2022-03-10: qty 1000

## 2022-03-10 MED ORDER — IOHEXOL 350 MG/ML SOLN
INTRAVENOUS | Status: DC | PRN
  Administered 2022-03-10: 10 mL via INTRA_ARTERIAL

## 2022-03-10 MED ORDER — LIDOCAINE HCL (PF) 1 % IJ SOLN
INTRAMUSCULAR | Status: DC | PRN
  Administered 2022-03-10: 12 mL

## 2022-03-10 MED ORDER — HEPARIN SODIUM (PORCINE) 5000 UNIT/ML IJ SOLN
4000.0000 [IU] | Freq: Once | INTRAMUSCULAR | Status: AC
  Filled 2022-03-10: qty 1

## 2022-03-10 MED ORDER — FENTANYL CITRATE (PF) 100 MCG/2ML IJ SOLN
INTRAMUSCULAR | Status: DC | PRN
  Administered 2022-03-10: 25 ug via INTRAVENOUS

## 2022-03-10 MED ORDER — SODIUM CHLORIDE 0.9% FLUSH
3.0000 mL | Freq: Two times a day (BID) | INTRAVENOUS | Status: DC
  Administered 2022-03-10 – 2022-03-11 (×3): 3 mL via INTRAVENOUS

## 2022-03-10 MED ORDER — VERAPAMIL HCL 2.5 MG/ML IV SOLN
INTRAVENOUS | Status: DC | PRN
  Administered 2022-03-10: 10 mL via INTRA_ARTERIAL

## 2022-03-10 MED ORDER — HEPARIN SODIUM (PORCINE) 1000 UNIT/ML IJ SOLN
INTRAMUSCULAR | Status: DC | PRN
  Administered 2022-03-10: 7000 [IU] via INTRAVENOUS

## 2022-03-10 MED ORDER — SODIUM CHLORIDE 0.9 % IV SOLN: INTRAVENOUS | Status: DC

## 2022-03-10 MED ORDER — AMIODARONE HCL IN DEXTROSE 360-4.14 MG/200ML-% IV SOLN
30.0000 mg/h | INTRAVENOUS | Status: DC
  Administered 2022-03-10: 30 mg/h via INTRAVENOUS
  Filled 2022-03-10: qty 200

## 2022-03-10 MED ORDER — MELATONIN 3 MG PO TABS
3.0000 mg | ORAL_TABLET | Freq: Every day | ORAL | Status: DC
  Administered 2022-03-11: 3 mg via ORAL
  Filled 2022-03-10 (×2): qty 1

## 2022-03-10 MED ORDER — HEPARIN (PORCINE) IN NACL 1000-0.9 UT/500ML-% IV SOLN
INTRAVENOUS | Status: DC | PRN
  Administered 2022-03-10 (×2): 500 mL

## 2022-03-10 SURGICAL SUPPLY — 15 items
CATH DIAG 6FR JR4 (CATHETERS) IMPLANT
CATH INFINITI 5FR ANG PIGTAIL (CATHETERS) IMPLANT
CATH LAUNCHER 6FR EBU3.5 (CATHETERS) IMPLANT
DEVICE RAD COMP TR BAND LRG (VASCULAR PRODUCTS) IMPLANT
GLIDESHEATH SLEND SS 6F .021 (SHEATH) IMPLANT
GUIDEWIRE INQWIRE 1.5J.035X260 (WIRE) IMPLANT
GUIDEWIRE VAS SION BLUE 190 (WIRE) IMPLANT
INQWIRE 1.5J .035X260CM (WIRE) ×1
KIT ENCORE 26 ADVANTAGE (KITS) IMPLANT
KIT HEART LEFT (KITS) ×2 IMPLANT
PACK CARDIAC CATHETERIZATION (CUSTOM PROCEDURE TRAY) ×2 IMPLANT
TRANSDUCER W/STOPCOCK (MISCELLANEOUS) ×2 IMPLANT
TUBING CIL FLEX 10 FLL-RA (TUBING) ×2 IMPLANT
VALVE GUARDIAN II ~~LOC~~ HEMO (MISCELLANEOUS) IMPLANT
WIRE EMERALD 3MM-J .035X260CM (WIRE) IMPLANT

## 2022-03-10 SURGICAL SUPPLY — 13 items
BALLN IABP SENSA PLUS 8F 50CC (BALLOONS) ×1
BALLOON IABP SENS PLUS 8F 50CC (BALLOONS) IMPLANT
CATH DIAG 6FR JL4 (CATHETERS) IMPLANT
GLIDESHEATH SLEND SS 6F .021 (SHEATH) IMPLANT
KIT ENCORE 26 ADVANTAGE (KITS) IMPLANT
KIT HEART LEFT (KITS) ×2 IMPLANT
PACK CARDIAC CATHETERIZATION (CUSTOM PROCEDURE TRAY) ×2 IMPLANT
SHEATH PINNACLE 6F 10CM (SHEATH) IMPLANT
SHEATH PROBE COVER 6X72 (BAG) IMPLANT
TRANSDUCER W/STOPCOCK (MISCELLANEOUS) ×2 IMPLANT
TUBING CIL FLEX 10 FLL-RA (TUBING) ×2 IMPLANT
WIRE EMERALD 3MM-J .035X260CM (WIRE) IMPLANT
WIRE MICRO SET SILHO 5FR 7 (SHEATH) IMPLANT

## 2022-03-10 NOTE — ED Notes (Signed)
MD and charge nurse in room. Patient placed on Pads. Patient sitting at 90 degrees in bed. Tender to palpation epigastric. Denies radiation.

## 2022-03-10 NOTE — ED Notes (Signed)
X ray in room.

## 2022-03-10 NOTE — ED Provider Notes (Signed)
[facility] DEPT Provider Note   CSN: [id number] Arrival date & time: 03/10/22  1144     History {Add pertinent medical, surgical, social history, OB history to HPI:1} Chief Complaint  Patient presents with   Chest Pain    Juan Snow is a 67 y.o. male.  HPI     67yo male with histofy of submassive PE s/p thrombectomy 05/2021 and LLE DVT on xarelto who presents with concern for chest pain and dyspnea.  Symptoms began just prior to arrival sudden onset chest pain, lightheadedness, nausea and dyspnea.  Reports a cramping, pressure like pain to chest, a tightness that is severe. Not necessarily worse    Recent flu like symptoms that improved.  Past Medical History:  Diagnosis Date   Arthritis    OA   Cough    WHITE SPUTUM OCC, NO FEVER COLD SYMPTOMS MAINLY   GERD (gastroesophageal reflux disease)      Home Medications Prior to Admission medications   Medication Sig Start Date End Date Taking? Authorizing Provider  apixaban (ELIQUIS) 5 MG TABS tablet Take 2 tablets (10 mg total) by mouth 2 (two) times daily. '10mg'$  two times day for 6  more days, Then decrease to '5mg'$  two times daily Patient not taking: Reported on 12/06/2021 05/13/21   [name], NP  Ascorbic Acid (VITAMIN C PO) Take 1 tablet by mouth daily.    [provider]  atorvastatin (LIPITOR) 10 MG tablet Take 1 tablet (10 mg total) by mouth daily. Patient not taking: Reported on 12/06/2021 06/29/21     Cholecalciferol (VITAMIN D-3 PO) Take 4 tablets by mouth daily.    [provider]  Coenzyme Q10 (CO Q-10 PO) Take 1 capsule by mouth daily.    [provider]  famotidine-calcium carbonate-magnesium hydroxide (PEPCID COMPLETE) 10-800-165 MG chewable tablet Chew 1-2 tablets by mouth 2 (two) times daily as needed (acid reflux).    [provider]  Menaquinone-7 (VITAMIN K2 PO) Take 1 tablet by mouth daily.    [provider]  Omega-3 Fatty  Acids (FISH OIL) 1000 MG CAPS Take 1 capsule (1,000 mg total) by mouth daily. 05/13/21   Icard, [name], DO  OVER THE COUNTER MEDICATION Take 15 mLs by mouth daily. Black Seed Oil.    [provider]  pantoprazole (PROTONIX) 40 MG tablet Take 1 tablet (40 mg total) by mouth daily. Patient taking differently: Take 40 mg by mouth daily as needed (acid reflux). 03/31/21   [name], MD  RIVAROXABAN Alveda Reasons) VTE STARTER PACK (15 & 20 MG) Follow package directions: Take one '15mg'$  tablet by mouth twice a day. On day 22, switch to one '20mg'$  tablet once a day. Take with food. 12/06/21   [name], PA-C      Allergies    Patient has no known allergies.    Review of Systems   Review of Systems  Physical Exam Updated Vital Signs BP (!) 125/103   Pulse 91   Resp (!) 22   Ht '5\' 7"'$  (1.702 m)   Wt 85 kg   SpO2 99%   BMI 29.35 kg/m  Physical Exam  ED Results / Procedures / Treatments   Labs (all labs ordered are listed, but only abnormal results are displayed) Labs Reviewed - No data to display  EKG None  Radiology No results found.  Procedures Procedures  {Document cardiac monitor, telemetry assessment procedure when appropriate:1}  Medications Ordered in ED Medications - No data  to display  ED Course/ Medical Decision Making/ A&P                           Medical Decision Making Amount and/or Complexity of Data Reviewed Labs: ordered. Radiology: ordered.  Risk OTC drugs. Prescription drug management.   ***  {Document critical care time when appropriate:1} {Document review of labs and clinical decision tools ie heart score, Chads2Vasc2 etc:1}  {Document your independent review of radiology images, and any outside records:1} {Document your discussion with family members, caretakers, and with consultants:1} {Document social determinants of health affecting pt's care:1} {Document your decision making why or why not admission, treatments were  needed:1} Final Clinical Impression(s) / ED Diagnoses Final diagnoses:  None    Rx / DC Orders ED Discharge Orders     None

## 2022-03-10 NOTE — Progress Notes (Signed)
ANTICOAGULATION CONSULT NOTE - Follow Up Consult  Pharmacy Consult for heparin Indication: chest pain/ACS and DVT with history of PE  No Known Allergies  Patient Measurements: Height: '5\' 7"'$  (170.2 cm) Weight: 85 kg (187 lb 6.3 oz) IBW/kg (Calculated) : 66.1 Heparin Dosing Weight: 83.3 kg  Vital Signs: Temp: 97.8 F (36.6 C) (01/07 1530) Temp Source: Oral (01/07 1530) BP: 88/70 (01/07 2120) Pulse Rate: 79 (01/07 2030)  Labs: Recent Labs    03/10/22 1155 03/10/22 1430  HGB 17.5*  --   HCT 52.3*  --   PLT 189  --   APTT 44*  --   LABPROT 16.6*  --   INR 1.4*  --   CREATININE 1.88*  --   TROPONINIHS 153* >24,000*     Estimated Creatinine Clearance: 40.8 mL/min (A) (by C-G formula based on SCr of 1.88 mg/dL (H)).   Medical History: Past Medical History:  Diagnosis Date   Arthritis    OA   Cough    WHITE SPUTUM OCC, NO FEVER COLD SYMPTOMS MAINLY   GERD (gastroesophageal reflux disease)     Medications:  Medications Prior to Admission  Medication Sig Dispense Refill Last Dose   Ascorbic Acid (VITAMIN C PO) Take 1 tablet by mouth daily.   Past Week   Cholecalciferol (VITAMIN D-3 PO) Take 4 tablets by mouth daily.   03/09/2022   famotidine-calcium carbonate-magnesium hydroxide (PEPCID COMPLETE) 10-800-165 MG chewable tablet Chew 1-2 tablets by mouth 2 (two) times daily as needed (acid reflux).   03/09/2022   Menaquinone-7 (VITAMIN K2 PO) Take 1 tablet by mouth daily.   03/09/2022   Omega-3 Fatty Acids (FISH OIL) 1000 MG CAPS Take 1 capsule (1,000 mg total) by mouth daily.  0 03/09/2022   RIVAROXABAN (XARELTO) VTE STARTER PACK (15 & 20 MG) Follow package directions: Take one '15mg'$  tablet by mouth twice a day. On day 22, switch to one '20mg'$  tablet once a day. Take with food. 51 each 0 03/10/2022 at 1000   apixaban (ELIQUIS) 5 MG TABS tablet Take 2 tablets (10 mg total) by mouth 2 (two) times daily. '10mg'$  two times day for 6  more days, Then decrease to '5mg'$  two times daily (Patient  not taking: Reported on 12/06/2021) 60 tablet 0 Unknown   atorvastatin (LIPITOR) 10 MG tablet Take 1 tablet (10 mg total) by mouth daily. (Patient not taking: Reported on 12/06/2021) 90 tablet 3 Unknown   Coenzyme Q10 (CO Q-10 PO) Take 1 capsule by mouth daily.   Unknown   OVER THE COUNTER MEDICATION Take 15 mLs by mouth daily. Black Seed Oil.   Unknown   pantoprazole (PROTONIX) 40 MG tablet Take 1 tablet (40 mg total) by mouth daily. (Patient taking differently: Take 40 mg by mouth daily as needed (acid reflux).) 30 tablet 1    Scheduled:   [MAR Hold] aspirin  81 mg Oral Daily   [MAR Hold] atorvastatin  80 mg Oral Daily   [MAR Hold] pantoprazole  40 mg Oral Daily   [MAR Hold] sodium chloride flush  3 mL Intravenous Q12H   Infusions:   sodium chloride     [MAR Hold] sodium chloride     amiodarone     heparin     [MAR Hold] nitroGLYCERIN      Assessment: Patient presented as code STEMI, coronary angiograph demonstrated a high grade ostial LAD lesion with suspicion for ramus involvement. Cardiology decided on medical management at this time given TIMI 3 flow and resolution of  symptoms. Deferring PCI for now. Patient had acute massive PE in March of 2023- treated with apixaban - stopped 7/23, then found to have acute DVT in October treated with rivaroxaban PTA. Last dose of rivaroxaban PTA was 1/7 at 1000. Pharmacy consulted to dose heparin  PM: patient with ongoing cp > back to cath lab for IABP 1:1 Received heparin 7000 uts in lab for procedure Will begin low dose heparin drip for IABP post procedure despite last rivaroxaban dose earlier this am  -  no bolus   Goal of Therapy:  aPTT 66-102 seconds Monitor platelets by anticoagulation protocol: Yes   Plan:  Start heparin drip 800 uts/hr  Heparin level and aPTT daily Monitor s/s bleeding     Bonnita Nasuti Pharm.D. CPP, BCPS Clinical Pharmacist [phone] 03/10/2022 9:48 PM   Check [email or web address] for unit specific pharmacy number

## 2022-03-10 NOTE — ED Notes (Signed)
2lpm [place] added for comfort and sob, pt requesting MD aware

## 2022-03-10 NOTE — ED Triage Notes (Signed)
Pt reports sudden onset of sob and central cp while at gym performing normal routine workout with dizziness and lightheaded.  Pt reports cough x1 week.  Hx PE and on xarelto.

## 2022-03-10 NOTE — Progress Notes (Signed)
ANTICOAGULATION CONSULT NOTE - Initial Consult  Pharmacy Consult for heparin Indication: chest pain/ACS and DVT with history of PE  No Known Allergies  Patient Measurements: Height: '5\' 7"'$  (170.2 cm) Weight: 85 kg (187 lb 6.3 oz) IBW/kg (Calculated) : 66.1 Heparin Dosing Weight: 83.3 kg  Vital Signs: BP: 94/83 (01/07 1230) Pulse Rate: 96 (01/07 1230)  Labs: Recent Labs    03/10/22 1155  HGB 17.5*  HCT 52.3*  PLT 189  APTT 44*  LABPROT 16.6*  INR 1.4*  CREATININE 1.88*  TROPONINIHS 153*    Estimated Creatinine Clearance: 40.8 mL/min (A) (by C-G formula based on SCr of 1.88 mg/dL (H)).   Medical History: Past Medical History:  Diagnosis Date   Arthritis    OA   Cough    WHITE SPUTUM OCC, NO FEVER COLD SYMPTOMS MAINLY   GERD (gastroesophageal reflux disease)     Medications:  Medications Prior to Admission  Medication Sig Dispense Refill Last Dose   apixaban (ELIQUIS) 5 MG TABS tablet Take 2 tablets (10 mg total) by mouth 2 (two) times daily. '10mg'$  two times day for 6  more days, Then decrease to '5mg'$  two times daily (Patient not taking: Reported on 12/06/2021) 60 tablet 0    Ascorbic Acid (VITAMIN C PO) Take 1 tablet by mouth daily.      atorvastatin (LIPITOR) 10 MG tablet Take 1 tablet (10 mg total) by mouth daily. (Patient not taking: Reported on 12/06/2021) 90 tablet 3    Cholecalciferol (VITAMIN D-3 PO) Take 4 tablets by mouth daily.      Coenzyme Q10 (CO Q-10 PO) Take 1 capsule by mouth daily.      famotidine-calcium carbonate-magnesium hydroxide (PEPCID COMPLETE) 10-800-165 MG chewable tablet Chew 1-2 tablets by mouth 2 (two) times daily as needed (acid reflux).      Menaquinone-7 (VITAMIN K2 PO) Take 1 tablet by mouth daily.      Omega-3 Fatty Acids (FISH OIL) 1000 MG CAPS Take 1 capsule (1,000 mg total) by mouth daily.  0    OVER THE COUNTER MEDICATION Take 15 mLs by mouth daily. Black Seed Oil.      pantoprazole (PROTONIX) 40 MG tablet Take 1 tablet (40 mg  total) by mouth daily. (Patient taking differently: Take 40 mg by mouth daily as needed (acid reflux).) 30 tablet 1    RIVAROXABAN (XARELTO) VTE STARTER PACK (15 & 20 MG) Follow package directions: Take one '15mg'$  tablet by mouth twice a day. On day 22, switch to one '20mg'$  tablet once a day. Take with food. 51 each 0    Scheduled:   metoprolol tartrate  12.5 mg Oral Once   Infusions:   sodium chloride     amiodarone 60 mg/hr (03/10/22 1231)   amiodarone     amiodarone      Assessment: Patient presented as code STEMI, coronary angiograph demonstrated a high grade ostial LAD lesion with suspicion for ramus involvement. Cardiology decided on medical management at this time given TIMI 3 flow and resolution of symptoms. Deferring PCI for now. Patient had acute massive PE in March of 2023, then found to have acute DVT in October treated with Xarelto PTA. Last dose of Xarelto PTA was 1/7 at 1000. Pharmacy consulted to dose heparin.  Goal of Therapy:  aPTT 66-102 seconds Monitor platelets by anticoagulation protocol: Yes   Plan:  Start heparin at 1200 on 1/7 at 1000 Heparin level and aPTT daily aPTT tomorrow 8 hours from start Follow plans for intervention vs  medical management  Thank you for allowing pharmacy to participate in this patient's care.  Reatha Harps, PharmD PGY2 Pharmacy Resident 03/10/2022 2:30 PM Check [email or web address] for unit specific pharmacy number

## 2022-03-10 NOTE — ED Notes (Signed)
Code stemi called. Primary RN aware. 2nd IV line obtained.

## 2022-03-10 NOTE — ED Notes (Signed)
Per MD second nitro admin

## 2022-03-10 NOTE — ED Notes (Signed)
[facility] has arrived

## 2022-03-10 NOTE — Progress Notes (Signed)
Received pt from cath lab. Pt placed on 2H17 monitor. Assessment completed. Pt in NAD with no concerns or complaints at this time. Denies pain. CHG bath completed. Educated pt on TR band and removal procedure as well as times for each step. Pt reports his wife is in the waiting room and wishes for her to come back. Once the pt is settled and admission information completed, pt's wife Jordan Hawks) is brought back to pt room. Education for TR band process given to wife at bedside. Neither pt nor his wife have any questions at this time.

## 2022-03-10 NOTE — H&P (Signed)
Cardiology Admission History and Physical   Patient ID: Juan Snow MRN: [id number]; DOB: [birth year]   Admission date: 03/10/2022  PCP:  Lurline Del, [place] Providers Cardiologist:  None        Chief Complaint: Chest pain  Patient Profile:   Juan Snow is a 67 y.o. 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  History of Present Illness:   Juan Snow is a 67 year old male with the above listed medical problems who developed acute onset chest pain about 1 hour ago.  The patient was was in his normal state of health.  He was working out at [employer].  Apparently he developed 10 out of 10 chest pain.  He presented to [facility] emergency department where his initial EKG demonstrated peaked T waves but not criteria for an ST elevation myocardial infarction.  A repeat EKG demonstrated an acute anterolateral ST elevation myocardial infarction.  He was loaded with 4000 units of heparin, aspirin, and received amiodarone for nonsustained ventricular tachycardia.  On arrival here in the emergency department at [facility] the patient endorses 6 out of 10 chest pain.  He is referred for emergency coronary angiography.  His last dose of Xarelto was this morning.   Past Medical History:  Diagnosis Date   Arthritis    OA   Cough    WHITE SPUTUM OCC, NO FEVER COLD SYMPTOMS MAINLY   GERD (gastroesophageal reflux disease)     Past Surgical History:  Procedure Laterality Date   CHOLECYSTECTOMY N/A 03/30/2021   Procedure: LAPAROSCOPIC CHOLECYSTECTOMY WITH INTRAOPERATIVE CHOLANGIOGRAM;  Surgeon: Armandina Gemma, MD;  Location: [facility] ORS;  Service: General;  Laterality: N/A;   COLONOSCOPY  06/17/2011   Procedure: COLONOSCOPY;  Surgeon: [name], MD;  Location: [facility] ENDOSCOPY;  Service: Endoscopy;   Laterality: N/A;   IR ANGIOGRAM PULMONARY BILATERAL SELECTIVE  05/11/2021   IR ANGIOGRAM SELECTIVE EACH ADDITIONAL VESSEL  05/11/2021   IR ANGIOGRAM SELECTIVE EACH ADDITIONAL VESSEL  05/11/2021   IR RADIOLOGIST EVAL & MGMT  06/06/2021   IR RADIOLOGIST EVAL & MGMT  09/06/2021   IR RADIOLOGIST EVAL & MGMT  12/26/2021   IR THROMBECT VENO MECH MOD SED  05/11/2021   IR US GUIDE VASC ACCESS RIGHT  05/11/2021   KNEE SURGERY Right YRS AGO   MENISCUS   THUMB ARTHROSCOPY Right YRS AGO   TOTAL HIP ARTHROPLASTY Left 06/25/2017   Procedure: LEFT TOTAL HIP ARTHROPLASTY ANTERIOR APPROACH;  Surgeon: Gaynelle Arabian, MD;  Location: [facility] OR[facility];  Service: Orthopedics;  Laterality: Left;     Medications Prior to Admission: Prior to Admission medications   Medication Sig Start Date End Date Taking? Authorizing Provider  apixaban (ELIQUIS) 5 MG TABS tablet Take 2 tablets (10 mg total) by mouth 2 (two) times daily. '10mg'$  two times day for 6  more days, Then decrease to '5mg'$  two times daily Patient not taking: Reported on 12/06/2021 05/13/21   [name], NP  Ascorbic Acid (VITAMIN C PO) Take 1 tablet by mouth daily.    [provider]  atorvastatin (LIPITOR) 10 MG tablet Take 1 tablet (10 mg total) by mouth daily. Patient not taking: Reported on 12/06/2021 06/29/21     Cholecalciferol (VITAMIN D-3 PO) Take 4 tablets by mouth daily.    [provider]  Coenzyme Q10 (CO  Q-10 PO) Take 1 capsule by mouth daily.    [provider]  famotidine-calcium carbonate-magnesium hydroxide (PEPCID COMPLETE) 10-800-165 MG chewable tablet Chew 1-2 tablets by mouth 2 (two) times daily as needed (acid reflux).    [provider]  Menaquinone-7 (VITAMIN K2 PO) Take 1 tablet by mouth daily.    [provider]  Omega-3 Fatty Acids (FISH OIL) 1000 MG CAPS Take 1 capsule (1,000 mg total) by mouth daily. 05/13/21   Icard, [name], DO  OVER THE COUNTER MEDICATION Take 15 mLs by mouth daily. Black Seed  Oil.    [provider]  pantoprazole (PROTONIX) 40 MG tablet Take 1 tablet (40 mg total) by mouth daily. Patient taking differently: Take 40 mg by mouth daily as needed (acid reflux). 03/31/21   [name], MD  RIVAROXABAN Alveda Reasons) VTE STARTER PACK (15 & 20 MG) Follow package directions: Take one '15mg'$  tablet by mouth twice a day. On day 22, switch to one '20mg'$  tablet once a day. Take with food. 12/06/21   [name], PA-C     Allergies:   No Known Allergies  Social History:   Social History   Socioeconomic History   Marital status: Married    Spouse name: Not on file   Number of children: Not on file   Years of education: Not on file   Highest education level: Not on file  Occupational History   Not on file  Tobacco Use   Smoking status: Never   Smokeless tobacco: Never  Vaping Use   Vaping Use: Never used  Substance and Sexual Activity   Alcohol use: No   Drug use: No   Sexual activity: Not on file  Other Topics Concern   Not on file  Social History Narrative   Not on file   Social Determinants of Health   Financial Resource Strain: Not on file  Food Insecurity: Not on file  Transportation Needs: Not on file  Physical Activity: Not on file  Stress: Not on file  Social Connections: Not on file  Intimate Partner Violence: Not on file    Family History:   The patient's family history includes CAD in his brother; Hypertension in an other family member. There is no history of Diabetes.    ROS:  Please see the history of present illness.  All other ROS reviewed and negative.     Physical Exam/Data:   Vitals:   03/10/22 1215 03/10/22 1220 03/10/22 1225 03/10/22 1230  BP: (!) 141/113 (!) 129/98 (!) 131/103 94/83  Pulse: 99 72 94 96  Resp: 11 (!) 27 (!) 24 20  SpO2: 98% 98% 98% 97%  Weight:      Height:       No intake or output data in the 24 hours ending 03/10/22 1303    03/10/2022   12:00 PM 12/06/2021    8:45 AM 12/05/2021    9:48 PM   Last 3 Weights  Weight (lbs) 187 lb 6.3 oz 188 lb 188 lb  Weight (kg) 85 kg 85.276 kg 85.276 kg     Body mass index is 29.35 kg/m.  General:  Well nourished, well developed, in no acute distress HEENT: normal Neck: no JVD Vascular: No carotid bruits; Distal pulses 2+ bilaterally   Cardiac:  normal S1, S2; RRR; no murmur  Lungs:  clear to auscultation bilaterally, no wheezing, rhonchi or rales  Abd: soft, nontender, no hepatomegaly  Ext: no edema Musculoskeletal:  No deformities, BUE and BLE  strength normal and equal Skin: warm and dry  Neuro:  CNs 2-12 intact, no focal abnormalities noted Psych:  Normal affect    EKG:  The ECG that was done today was personally reviewed and demonstrates an anterior lateral ST elevation myocardial infarction  Relevant CV Studies: TTE 2023  1. Left ventricular ejection fraction, by estimation, is 60 to 65%. The  left ventricle has normal function. The left ventricle has no regional  wall motion abnormalities. There is mild left ventricular hypertrophy.  Left ventricular diastolic parameters  are consistent with Grade I diastolic dysfunction (impaired relaxation).   2. Right ventricular systolic function is normal. The right ventricular  size is normal. There is normal pulmonary artery systolic pressure. The  estimated right ventricular systolic pressure is 02.5 mmHg.   3. The aortic valve is tricuspid. Aortic valve regurgitation is not  visualized.   4. No evidence of "saddle" PE, the main PA bifrucation is visualized.   5. The inferior vena cava is normal in size with greater than 50%  respiratory variability, suggesting right atrial pressure of 3 mmHg.   Laboratory Data:  High Sensitivity Troponin:  No results for input(s): "TROPONINIHS" in the last 720 hours.    ChemistryNo results for input(s): "NA", "K", "CL", "CO2", "GLUCOSE", "BUN", "CREATININE", "CALCIUM", "MG", "GFRNONAA", "GFRAA", "ANIONGAP" in the last 168 hours.  No results for  input(s): "PROT", "ALBUMIN", "AST", "ALT", "ALKPHOS", "BILITOT" in the last 168 hours. Lipids  Recent Labs  Lab 03/10/22 1155  CHOL 210*  TRIG 239*  HDL 41  LDLCALC 121*  CHOLHDL 5.1   Hematology Recent Labs  Lab 03/10/22 1155  WBC 7.6  RBC 5.75  HGB 17.5*  HCT 52.3*  MCV 91.0  MCH 30.4  MCHC 33.5  RDW 13.8  PLT 189   Thyroid No results for input(s): "TSH", "FREET4" in the last 168 hours. BNPNo results for input(s): "BNP", "PROBNP" in the last 168 hours.  DDimer No results for input(s): "DDIMER" in the last 168 hours.   Radiology/Studies:  DG Chest Port 1 View  Result Date: 03/10/2022 CLINICAL DATA:  Shortness of breath.  Chest pain. EXAM: PORTABLE CHEST 1 VIEW COMPARISON:  May 11, 2021 FINDINGS: The cardiomediastinal silhouette is stable. No pneumothorax. Mild interstitial prominence. No suspicious infiltrate. IMPRESSION: Bilateral interstitial prominence suggests pulmonary venous congestion/mild edema given history. Electronically Signed   By: Dorise Bullion III M.D.   On: 03/10/2022 12:41     Assessment and Plan:   Acute elevation myocardial infarction: Will refer the patient for emergency coronary angiography.  Will plan on loading the patient with Brilinta with a plan conversion to Plavix given the patient given the fact that the patient is on Xarelto.  Further recommendations will follow once coronary angiography has been performed. History of submassive PE: The patient is on Xarelto despite a submassive PE in March.  This is typically treated with 6 months of anticoagulation.  Will need to determine whether he requires extended dosing of his Xarelto.  If this is the case he will then need to be changed to Plavix as detailed above.  If Xarelto is no longer needed then Brilinta can potentially be continued. Hyperlipidemia: We will plan on treating the patient with high-dose atorvastatin GERD: We will plan on a PPI.   Risk Assessment/Risk Scores:    TIMI Risk Score  for ST  Elevation MI:   The patient's TIMI risk score is 4, which indicates a 7.3% risk of all cause mortality at 30 days.  Severity of Illness: The appropriate patient status for this patient is INPATIENT. Inpatient status is judged to be reasonable and necessary in order to provide the required intensity of service to ensure the patient's safety. The patient's presenting symptoms, physical exam findings, and initial radiographic and laboratory data in the context of their chronic comorbidities is felt to place them at high risk for further clinical deterioration. Furthermore, it is not anticipated that the patient will be medically stable for discharge from the hospital within 2 midnights of admission.   * I certify that at the point of admission it is my clinical judgment that the patient will require inpatient hospital care spanning beyond 2 midnights from the point of admission due to high intensity of service, high risk for further deterioration and high frequency of surveillance required.*   For questions or updates, please contact [place] Please consult [email or web address] for contact info under     Signed, Early Osmond, MD  03/10/2022 1:03 PM

## 2022-03-10 NOTE — ED Notes (Signed)
Pt still complaining of cramping aching pain substernal with little relief of nitro.  MD at bed side.  Diaphoretic and clammy.

## 2022-03-11 ENCOUNTER — Inpatient Hospital Stay (HOSPITAL_COMMUNITY): Payer: Medicare Other

## 2022-03-11 ENCOUNTER — Other Ambulatory Visit (HOSPITAL_COMMUNITY): Payer: Self-pay

## 2022-03-11 ENCOUNTER — Encounter (HOSPITAL_COMMUNITY): Payer: Self-pay | Admitting: Internal Medicine

## 2022-03-11 DIAGNOSIS — I2102 ST elevation (STEMI) myocardial infarction involving left anterior descending coronary artery: Secondary | ICD-10-CM | POA: Diagnosis not present

## 2022-03-11 DIAGNOSIS — R079 Chest pain, unspecified: Secondary | ICD-10-CM | POA: Diagnosis not present

## 2022-03-11 DIAGNOSIS — I213 ST elevation (STEMI) myocardial infarction of unspecified site: Secondary | ICD-10-CM

## 2022-03-11 DIAGNOSIS — I2510 Atherosclerotic heart disease of native coronary artery without angina pectoris: Secondary | ICD-10-CM

## 2022-03-11 LAB — BASIC METABOLIC PANEL
Anion gap: 11 (ref 5–15)
BUN: 15 mg/dL (ref 8–23)
CO2: 20 mmol/L — ABNORMAL LOW (ref 22–32)
Calcium: 8.4 mg/dL — ABNORMAL LOW (ref 8.9–10.3)
Chloride: 102 mmol/L (ref 98–111)
Creatinine, Ser: 1.22 mg/dL (ref 0.61–1.24)
GFR, Estimated: >60 mL/min (ref >60)
Glucose, Bld: 150 mg/dL — ABNORMAL HIGH (ref 70–99)
Potassium: 4 mmol/L (ref 3.5–5.1)
Sodium: 133 mmol/L — ABNORMAL LOW (ref 135–145)

## 2022-03-11 LAB — URINALYSIS, ROUTINE W REFLEX MICROSCOPIC
Bacteria, UA: NONE SEEN
Bilirubin Urine: NEGATIVE
Glucose, UA: NEGATIVE mg/dL
Ketones, ur: NEGATIVE mg/dL
Leukocytes,Ua: NEGATIVE
Nitrite: NEGATIVE
Protein, ur: NEGATIVE mg/dL
Specific Gravity, Urine: 1.014 (ref 1.005–1.030)
pH: 5 (ref 5.0–8.0)

## 2022-03-11 LAB — SURGICAL PCR SCREEN
MRSA, PCR: NEGATIVE
Staphylococcus aureus: NEGATIVE

## 2022-03-11 LAB — HEPARIN LEVEL (UNFRACTIONATED): Heparin Unfractionated: >1.1 IU/mL — ABNORMAL HIGH (ref 0.30–0.70)

## 2022-03-11 LAB — CBC
HCT: 48.3 % (ref 39.0–52.0)
Hemoglobin: 16.5 g/dL (ref 13.0–17.0)
MCH: 30.3 pg (ref 26.0–34.0)
MCHC: 34.2 g/dL (ref 30.0–36.0)
MCV: 88.8 fL (ref 80.0–100.0)
Platelets: 144 10*3/uL — ABNORMAL LOW (ref 150–400)
RBC: 5.44 MIL/uL (ref 4.22–5.81)
RDW: 14.3 % (ref 11.5–15.5)
WBC: 12.1 10*3/uL — ABNORMAL HIGH (ref 4.0–10.5)
nRBC: 0 % (ref 0.0–0.2)

## 2022-03-11 LAB — ECHOCARDIOGRAM COMPLETE
Area-P 1/2: 4.68 cm2
Calc EF: 28.6 %
Height: 67 in
S' Lateral: 3.2 cm
Single Plane A2C EF: 25.1 %
Single Plane A4C EF: 31.6 %
Weight: 3287.5 oz

## 2022-03-11 LAB — MAGNESIUM: Magnesium: 1.9 mg/dL (ref 1.7–2.4)

## 2022-03-11 LAB — APTT
aPTT: 131 seconds — ABNORMAL HIGH (ref 24–36)
aPTT: 50 seconds — ABNORMAL HIGH (ref 24–36)

## 2022-03-11 LAB — TYPE AND SCREEN
ABO/RH(D): B POS
Antibody Screen: NEGATIVE

## 2022-03-11 LAB — HEMOGLOBIN A1C
Hgb A1c MFr Bld: 6.2 % — ABNORMAL HIGH (ref 4.8–5.6)
Mean Plasma Glucose: 131 mg/dL

## 2022-03-11 LAB — SARS CORONAVIRUS 2 BY RT PCR: SARS Coronavirus 2 by RT PCR: POSITIVE — AB

## 2022-03-11 MED ORDER — POTASSIUM CHLORIDE 2 MEQ/ML IV SOLN
80.0000 meq | INTRAVENOUS | Status: DC
  Filled 2022-03-11: qty 40

## 2022-03-11 MED ORDER — INSULIN REGULAR(HUMAN) IN NACL 100-0.9 UT/100ML-% IV SOLN
INTRAVENOUS | Status: DC
  Filled 2022-03-11: qty 100

## 2022-03-11 MED ORDER — PERFLUTREN LIPID MICROSPHERE
1.0000 mL | INTRAVENOUS | Status: AC | PRN
  Administered 2022-03-11: 3 mL via INTRAVENOUS

## 2022-03-11 MED ORDER — CHLORHEXIDINE GLUCONATE CLOTH 2 % EX PADS
6.0000 | MEDICATED_PAD | Freq: Once | CUTANEOUS | Status: AC
  Administered 2022-03-12: 6 via TOPICAL

## 2022-03-11 MED ORDER — TRANEXAMIC ACID 1000 MG/10ML IV SOLN
1.5000 mg/kg/h | INTRAVENOUS | Status: AC
  Administered 2022-03-12: 1.5 mg/kg/h via INTRAVENOUS
  Filled 2022-03-11: qty 25

## 2022-03-11 MED ORDER — PLASMA-LYTE A IV SOLN
INTRAVENOUS | Status: DC
  Filled 2022-03-11: qty 2.5

## 2022-03-11 MED ORDER — DEXMEDETOMIDINE HCL IN NACL 400 MCG/100ML IV SOLN
0.1000 ug/kg/h | INTRAVENOUS | Status: AC
  Administered 2022-03-12: .2 ug/kg/h via INTRAVENOUS
  Filled 2022-03-11: qty 100

## 2022-03-11 MED ORDER — VANCOMYCIN HCL 1500 MG/300ML IV SOLN
1500.0000 mg | INTRAVENOUS | Status: AC
  Administered 2022-03-12: 1500 mg via INTRAVENOUS
  Filled 2022-03-11: qty 300

## 2022-03-11 MED ORDER — CHLORHEXIDINE GLUCONATE CLOTH 2 % EX PADS
6.0000 | MEDICATED_PAD | Freq: Every day | CUTANEOUS | Status: DC
  Administered 2022-03-11: 6 via TOPICAL

## 2022-03-11 MED ORDER — CHLORHEXIDINE GLUCONATE CLOTH 2 % EX PADS
6.0000 | MEDICATED_PAD | Freq: Once | CUTANEOUS | Status: AC
  Administered 2022-03-11: 6 via TOPICAL

## 2022-03-11 MED ORDER — NOREPINEPHRINE 4 MG/250ML-% IV SOLN
0.0000 ug/min | INTRAVENOUS | Status: AC
  Administered 2022-03-12: 3 ug/min via INTRAVENOUS
  Filled 2022-03-11: qty 250

## 2022-03-11 MED ORDER — METOPROLOL TARTRATE 12.5 MG HALF TABLET
12.5000 mg | ORAL_TABLET | Freq: Once | ORAL | Status: AC
  Administered 2022-03-12: 12.5 mg via ORAL
  Filled 2022-03-11: qty 1

## 2022-03-11 MED ORDER — MAGNESIUM SULFATE 2 GM/50ML IV SOLN
2.0000 g | Freq: Once | INTRAVENOUS | Status: AC
  Administered 2022-03-11: 2 g via INTRAVENOUS
  Filled 2022-03-11: qty 50

## 2022-03-11 MED ORDER — MILRINONE LACTATE IN DEXTROSE 20-5 MG/100ML-% IV SOLN
0.3000 ug/kg/min | INTRAVENOUS | Status: AC
  Administered 2022-03-12: .125 ug/kg/min via INTRAVENOUS
  Filled 2022-03-11: qty 100

## 2022-03-11 MED ORDER — TEMAZEPAM 7.5 MG PO CAPS
15.0000 mg | ORAL_CAPSULE | Freq: Once | ORAL | Status: AC | PRN
  Administered 2022-03-11: 15 mg via ORAL
  Filled 2022-03-11: qty 2

## 2022-03-11 MED ORDER — CEFAZOLIN SODIUM-DEXTROSE 2-4 GM/100ML-% IV SOLN
2.0000 g | INTRAVENOUS | Status: DC
  Filled 2022-03-11: qty 100

## 2022-03-11 MED ORDER — MUPIROCIN 2 % EX OINT
1.0000 | TOPICAL_OINTMENT | Freq: Two times a day (BID) | CUTANEOUS | Status: DC
  Administered 2022-03-11: 1 via NASAL
  Filled 2022-03-11: qty 22

## 2022-03-11 MED ORDER — PHENYLEPHRINE HCL-NACL 20-0.9 MG/250ML-% IV SOLN
30.0000 ug/min | INTRAVENOUS | Status: AC
  Administered 2022-03-12: 25 ug/min via INTRAVENOUS
  Filled 2022-03-11: qty 250

## 2022-03-11 MED ORDER — TRANEXAMIC ACID (OHS) PUMP PRIME SOLUTION
2.0000 mg/kg | INTRAVENOUS | Status: DC
  Filled 2022-03-11: qty 1.86

## 2022-03-11 MED ORDER — HEPARIN 30,000 UNITS/1000 ML (OHS) CELLSAVER SOLUTION
Status: DC
  Filled 2022-03-11: qty 1000

## 2022-03-11 MED ORDER — CHLORHEXIDINE GLUCONATE 0.12 % MT SOLN
15.0000 mL | Freq: Once | OROMUCOSAL | Status: AC
  Administered 2022-03-12: 15 mL via OROMUCOSAL
  Filled 2022-03-11: qty 15

## 2022-03-11 MED ORDER — FUROSEMIDE 10 MG/ML IJ SOLN
20.0000 mg | Freq: Once | INTRAMUSCULAR | Status: AC
  Administered 2022-03-11: 20 mg via INTRAVENOUS
  Filled 2022-03-11: qty 2

## 2022-03-11 MED ORDER — EPINEPHRINE HCL 5 MG/250ML IV SOLN IN NS
0.0000 ug/min | INTRAVENOUS | Status: AC
  Administered 2022-03-12: 2 ug/min via INTRAVENOUS
  Filled 2022-03-11: qty 250

## 2022-03-11 MED ORDER — ACETAMINOPHEN 500 MG PO TABS
1000.0000 mg | ORAL_TABLET | Freq: Once | ORAL | Status: AC
  Administered 2022-03-11: 1000 mg via ORAL
  Filled 2022-03-11: qty 2

## 2022-03-11 MED ORDER — BISACODYL 5 MG PO TBEC
5.0000 mg | DELAYED_RELEASE_TABLET | Freq: Once | ORAL | Status: AC
  Administered 2022-03-11: 5 mg via ORAL
  Filled 2022-03-11: qty 1

## 2022-03-11 MED ORDER — MANNITOL 20 % IV SOLN
INTRAVENOUS | Status: DC
  Filled 2022-03-11: qty 13

## 2022-03-11 MED ORDER — TRANEXAMIC ACID (OHS) BOLUS VIA INFUSION
15.0000 mg/kg | INTRAVENOUS | Status: AC
  Administered 2022-03-12: 1398 mg via INTRAVENOUS
  Filled 2022-03-11: qty 1398

## 2022-03-11 NOTE — Consult Note (Addendum)
[address]       [place] [address]             [phone]        Burdett E Tegtmeyer [facility] Medical Record #[id number] Date of Birth: Jul 10, 1955  Referring: No ref. provider found Primary Care: Lurline Del, DO Primary Cardiologist:None  Chief Complaint:    Chief Complaint  Patient presents with   Chest Pain    History of Present Illness:   We are asked to see this 67 year old male in CT surgical consultation for consideration of coronary artery surgical revascularization.  He has a significant history of pulmonary embolism in March 2023 treated with catheter directed thrombolysis.  He is on Xarelto and will require a washout period prior to surgical intervention.  He presented to the ER yesterday with an acute onset of chest pain.  The pain is described as 10/10.  He was found on initial EKG to have peaked T waves but did not meet criteria for for ST elevation myocardial infarction.  A subsequent EKG did demonstrate an acute anterolateral ST elevation myocardial infarction.  He was loaded with heparin, aspirin and received amiodarone for nonsustained ventricular tachycardia.  On arrival to the emergency department pain was approximately 6/10.  He was referred for emergency coronary angiography.  He was found to have an ostial LAD to proximal LAD lesion in the 80% stenosis range as well as a 80% ramus intermedius lesion.  An intra-aortic balloon pump was placed and his anginal symptoms resolved.  An echocardiogram has been performed and results are pending.  Other significant past medical history includes osteoarthritis, hyperlipidemia and GERD.  His most recent troponin I was measured at greater than 24,000 after initial elevation of 153.  He has a minor leukocytosis with a WBC of 12.1 and minor thrombocytopenia with a platelet count of 144 K.  Renal function is noted to be within normal limits.  He has no history of diabetes or thyroid disease.    Current  Activity/ Functional Status: Patient was independent with mobility/ambulation, transfers, ADL's, IADL's.   Zubrod Score: At the time of surgery this patient's most appropriate activity status/level should be described as: '[]'$     0    Normal activity, no symptoms '[]'$     1    Restricted in physical strenuous activity but ambulatory, able to do out light work '[]'$     2    Ambulatory and capable of self care, unable to do work activities, up and about                 more than 50%  Of the time                            '[]'$     3    Only limited self care, in bed greater than 50% of waking hours '[x]'$     4    Completely disabled, no self care, confined to bed or chair '[]'$     5    Moribund  Past Medical History:  Diagnosis Date   Arthritis    OA   Cough    WHITE SPUTUM OCC, NO FEVER COLD SYMPTOMS MAINLY   GERD (gastroesophageal reflux disease)     Past Surgical History:  Procedure Laterality Date   CHOLECYSTECTOMY N/A 03/30/2021   Procedure: LAPAROSCOPIC CHOLECYSTECTOMY WITH INTRAOPERATIVE CHOLANGIOGRAM;  Surgeon: Armandina Gemma, MD;  Location: [facility];  Service: General;  Laterality: N/A;   COLONOSCOPY  06/17/2011   Procedure: COLONOSCOPY;  Surgeon: [name], MD;  Location: [facility];  Service: Endoscopy;  Laterality: N/A;   CORONARY/GRAFT ACUTE MI REVASCULARIZATION N/A 03/10/2022   Procedure: Coronary/Graft Acute MI Revascularization;  Surgeon: Early Osmond, MD;  Location: [facility];  Service: Cardiovascular;  Laterality: N/A;   IABP INSERTION N/A 03/10/2022   Procedure: IABP Insertion;  Surgeon: Early Osmond, MD;  Location: [facility];  Service: Cardiovascular;  Laterality: N/A;   IR ANGIOGRAM PULMONARY BILATERAL SELECTIVE  05/11/2021   IR ANGIOGRAM SELECTIVE EACH ADDITIONAL VESSEL  05/11/2021   IR ANGIOGRAM SELECTIVE EACH ADDITIONAL VESSEL  05/11/2021   IR RADIOLOGIST EVAL & MGMT  06/06/2021   IR RADIOLOGIST EVAL & MGMT  09/06/2021   IR RADIOLOGIST EVAL & MGMT  12/26/2021    IR THROMBECT VENO MECH MOD SED  05/11/2021   IR US GUIDE VASC ACCESS RIGHT  05/11/2021   KNEE SURGERY Right YRS AGO   MENISCUS   LEFT HEART CATH AND CORONARY ANGIOGRAPHY N/A 03/10/2022   Procedure: LEFT HEART CATH AND CORONARY ANGIOGRAPHY;  Surgeon: Early Osmond, MD;  Location: [facility];  Service: Cardiovascular;  Laterality: N/A;   LEFT HEART CATH AND CORONARY ANGIOGRAPHY N/A 03/10/2022   Procedure: LEFT HEART CATH AND CORONARY ANGIOGRAPHY;  Surgeon: Early Osmond, MD;  Location: [facility];  Service: Cardiovascular;  Laterality: N/A;   THUMB ARTHROSCOPY Right YRS AGO   TOTAL HIP ARTHROPLASTY Left 06/25/2017   Procedure: LEFT TOTAL HIP ARTHROPLASTY ANTERIOR APPROACH;  Surgeon: Gaynelle Arabian, MD;  Location: [facility];  Service: Orthopedics;  Laterality: Left;    Social History   Tobacco Use  Smoking Status Never  Smokeless Tobacco Never    Social History   Substance and Sexual Activity  Alcohol Use No     No Known Allergies  Current Facility-Administered Medications  Medication Dose Route Frequency Provider Last Rate Last Admin   0.9 %  sodium chloride infusion   Intravenous Continuous Schlossman, Erin, MD       0.9 %  sodium chloride infusion  250 mL Intravenous PRN Early Osmond, MD       acetaminophen (TYLENOL) tablet 650 mg  650 mg Oral Q4H PRN Early Osmond, MD   650 mg at 03/10/22 1837   alum & mag hydroxide-simeth (MAALOX/MYLANTA) 200-200-20 MG/5ML suspension 30 mL  30 mL Oral Q6H PRN Early Osmond, MD   30 mL at 03/10/22 1550   aspirin chewable tablet 81 mg  81 mg Oral Daily Early Osmond, MD   81 mg at 03/11/22 1024   atorvastatin (LIPITOR) tablet 80 mg  80 mg Oral Daily Early Osmond, MD   80 mg at 03/11/22 1024   Chlorhexidine Gluconate Cloth 2 % PADS 6 each  6 each Topical Daily Bensimhon, Shaune Pascal, MD   6 each at 03/11/22 1000   heparin ADULT infusion 100 units/mL (25000 units/266m)  600 Units/hr Intravenous Continuous BEinar Grad RPH 6 mL/hr at 03/11/22 0900 600 Units/hr at 03/11/22 0900   melatonin tablet 3 mg  3 mg Oral QHS KJaci Lazier MD       morphine (PF) 2 MG/ML injection 1 mg  1 mg Intravenous Q2H PRN TEarly Osmond MD   1 mg at 03/10/22 2304   mupirocin ointment (BACTROBAN) 2 % 1 Application  1 Application Nasal BID TEarly Osmond MD  1 Application at 24/58/09 1024   nitroGLYCERIN (NITROSTAT) SL tablet 0.4 mg  0.4 mg Sublingual Q5 min PRN Jaci Lazier, MD   0.4 mg at 03/11/22 0052   nitroGLYCERIN 50 mg in dextrose 5 % 250 mL (0.2 mg/mL) infusion  2-40 mcg/min Intravenous Titrated Jaci Lazier, MD       Oral care mouth rinse  15 mL Mouth Rinse PRN Early Osmond, MD       pantoprazole (PROTONIX) EC tablet 40 mg  40 mg Oral Daily Early Osmond, MD   40 mg at 03/11/22 1024   perflutren lipid microspheres (DEFINITY) IV suspension  1-10 mL Intravenous PRN Early Osmond, MD   3 mL at 03/11/22 1010   simethicone (MYLICON) chewable tablet 80-160 mg  80-160 mg Oral QID PRN Early Osmond, MD   160 mg at 03/10/22 2345   sodium chloride flush (NS) 0.9 % injection 3 mL  3 mL Intravenous Q12H Early Osmond, MD   3 mL at 03/11/22 1024   sodium chloride flush (NS) 0.9 % injection 3 mL  3 mL Intravenous PRN Early Osmond, MD        Medications Prior to Admission  Medication Sig Dispense Refill Last Dose   Ascorbic Acid (VITAMIN C PO) Take 1 tablet by mouth daily.   Past Week   Cholecalciferol (VITAMIN D-3 PO) Take 1 tablet by mouth daily.   Past Week   Coenzyme Q10 (CO Q-10 PO) Take 1 capsule by mouth daily.   Past Week   famotidine-calcium carbonate-magnesium hydroxide (PEPCID COMPLETE) 10-800-165 MG chewable tablet Chew 1-2 tablets by mouth 2 (two) times daily as needed (acid reflux).   Past Week   Menaquinone-7 (VITAMIN K2 PO) Take 1 tablet by mouth daily.   Past Week   Omega-3 Fatty Acids (FISH OIL) 1000 MG CAPS Take 1 capsule (1,000 mg total) by mouth daily.  0 Past Week   OVER THE  COUNTER MEDICATION Take 15 mLs by mouth daily. Black Seed Oil.   Past Week   RIVAROXABAN (XARELTO) VTE STARTER PACK (15 & 20 MG) Follow package directions: Take one '15mg'$  tablet by mouth twice a day. On day 22, switch to one '20mg'$  tablet once a day. Take with food. 51 each 0 03/10/2022 at 1000   apixaban (ELIQUIS) 5 MG TABS tablet Take 2 tablets (10 mg total) by mouth 2 (two) times daily. '10mg'$  two times day for 6  more days, Then decrease to '5mg'$  two times daily (Patient not taking: Reported on 12/06/2021) 60 tablet 0 Not Taking    Family History  Problem Relation Age of Onset   CAD Brother    Hypertension Other    Diabetes Neg Hx      Review of Systems:   Review of Systems  Constitutional:  Positive for diaphoresis.       Patient is seen and the flu approximately a week and a half ago but the symptoms have resolved.  HENT: Negative.    Eyes: Negative.   Respiratory:  Positive for shortness of breath.   Cardiovascular:  Positive for chest pain.  Gastrointestinal:  Positive for heartburn.  Genitourinary: Negative.   Musculoskeletal: Negative.   Skin: Negative.   Neurological: Negative.   Endo/Heme/Allergies: Negative.   Psychiatric/Behavioral: Negative.        Physical Exam: BP (!) 136/94   Pulse 77   Temp 98.5 F (36.9 C) (Oral)   Resp (!) 24   Ht '5\' 7"'$  (  1.702 m)   Wt 93.2 kg   SpO2 93%   BMI 32.18 kg/m    General appearance: alert, cooperative, and no distress Head: Normocephalic, without obvious abnormality, atraumatic Neck: no adenopathy, no carotid bruit, no JVD, supple, symmetrical, trachea midline, and thyroid not enlarged, symmetric, no tenderness/mass/nodules Lymph nodes: Cervical, supraclavicular, and axillary nodes normal. Resp: clear to auscultation bilaterally Back: symmetric, no curvature. ROM normal. No CVA tenderness. Cardio: regular rate and rhythm, S1, S2 normal, no murmur, click, rub or gallop GI: soft, non-tender; bowel sounds normal; no masses,  no  organomegaly Extremities: extremities normal, atraumatic, no cyanosis or edema Neurologic: Grossly normal  Diagnostic Studies & Laboratory data:     Recent Radiology Findings:   DG Chest Port 1 View  Result Date: 03/11/2022 CLINICAL DATA:  Intra-aortic balloon pump. EXAM: PORTABLE CHEST 1 VIEW COMPARISON:  03/10/2022 and CT chest 05/11/2021. FINDINGS: Trachea is midline. Heart size stable. Intra-aortic balloon pump tip projects over the proximal descending thoracic aorta. Defibrillator pads are in place along the left chest. Lungs are somewhat low in volume with bibasilar streaky atelectasis. Right upper lobe scarring. There may be a small right pleural effusion. IMPRESSION: 1. Stable intra-aortic balloon pump position. 2. Low lung volumes with scattered atelectasis. 3. Probable small right pleural effusion. Electronically Signed   By: Lorin Picket M.D.   On: 03/11/2022 08:14   CARDIAC CATHETERIZATION  Addendum Date: 03/11/2022     Ost LAD to Prox LAD lesion is 80% stenosed.   Ramus lesion is 80% stenosed. 1.  High-grade ostial LAD lesion and suspected high-grade ostial ramus lesion.  Multiple viewing angles were employed to try to separate the ostium of the ramus from the ostium of the LAD.  This could not be accomplished.  Given that the patient has TIMI-3 flow in all vessels, percutaneous intervention was deferred.  Continue medical management for now and the best revascularization strategy will be determined.  Given the lack of adequate viewing angles, surgical revascularization may be the best strategy. 2.  Ventriculography demonstrated an anterior wall motion abnormality with an LVEDP of 22 mmHg. Recommendation: Since the patient's last dose of Xarelto was earlier this morning, will start heparin drip tomorrow per pharmacy.  If the patient develops recurrent chest pain not responsive to medical therapy, will consider intra-aortic balloon pump placement.  Obtain echocardiogram and cardiothoracic  surgery consult tomorrow.  This was discussed with the patient and his family.  Result Date: 03/11/2022   Ost LAD to Prox LAD lesion is 80% stenosed.   Ramus lesion is 80% stenosed. 1.  High-grade ostial LAD lesion and suspected high-grade ostial ramus lesion.  Multiple viewing angles were employed to try to separate the ostium of the ramus from the ostium of the LAD.  This could not be accomplished.  Given that the patient has TIMI-3 flow intervention was deferred.  The patient will be treated medically.  Revascularization strategy will be determined.  Given the lack of adequate viewing angles surgical revascularization may be the best strategy. 2.  Ventriculography demonstrated an anterior wall motion abnormality with an LVEDP of 22 mmHg. Recommendation: Since the patient's last dose of Xarelto was earlier this morning, will start heparin drip tomorrow per pharmacy.  If the patient develops recurrent chest pain not responsive to medical therapy will consider intra-aortic balloon pump placement.  Obtain echocardiogram and cardiothoracic surgery consult.  This was discussed with the patient and his family.   DG Chest Port 1 View  Result Date: 03/10/2022 CLINICAL  DATA:  Intra-aortic balloon pump. EXAM: PORTABLE CHEST 1 VIEW COMPARISON:  03/10/2022. FINDINGS: The heart size and mediastinal contours are stable. A intra-aortic balloon pump radiopaque marker is noted in the anticipated region of the proximal descending aorta. Lung volumes are low with vascular crowding. Atelectasis is present at the lung bases. There is a small left pleural effusion. No pneumothorax. No acute osseous abnormality. IMPRESSION: 1. Intra-aortic balloon pump radiopaque marker in the anticipated region of the proximal descending aorta. 2. Low lung volumes with mild atelectasis at the lung bases. 3. Small left pleural effusion. Electronically Signed   By: Brett Fairy M.D.   On: 03/10/2022 22:44   CARDIAC CATHETERIZATION  Result Date:  03/10/2022   Ost LAD to Prox LAD lesion is 80% stenosed.   Ramus lesion is 80% stenosed. 1.  Successful intra-aortic balloon pump placement activated at 1:1 with resolution of unstable anginal symptoms. 2.  Unchanged burden of left-sided disease with TIMI-3 flow in all vessels. Recommendation: Cardiothoracic surgical evaluation in the morning.  After discussion with pharmacy a heparin drip will be started later tonight.   DG Chest Port 1 View  Result Date: 03/10/2022 CLINICAL DATA:  Shortness of breath.  Chest pain. EXAM: PORTABLE CHEST 1 VIEW COMPARISON:  May 11, 2021 FINDINGS: The cardiomediastinal silhouette is stable. No pneumothorax. Mild interstitial prominence. No suspicious infiltrate. IMPRESSION: Bilateral interstitial prominence suggests pulmonary venous congestion/mild edema given history. Electronically Signed   By: Dorise Bullion III M.D.   On: 03/10/2022 12:41     I have independently reviewed the above radiologic studies and discussed with the patient   Recent Lab Findings: Lab Results  Component Value Date   WBC 12.1 (H) 03/11/2022   HGB 16.5 03/11/2022   HCT 48.3 03/11/2022   PLT 144 (L) 03/11/2022   GLUCOSE 150 (H) 03/11/2022   CHOL 210 (H) 03/10/2022   TRIG 239 (H) 03/10/2022   HDL 41 03/10/2022   LDLCALC 121 (H) 03/10/2022   ALT 24 03/10/2022   AST 26 03/10/2022   NA 133 (L) 03/11/2022   K 4.0 03/11/2022   CL 102 03/11/2022   CREATININE 1.22 03/11/2022   BUN 15 03/11/2022   CO2 20 (L) 03/11/2022   TSH 0.785 03/27/2021   INR 1.4 (H) 03/10/2022      Assessment / Plan: Acute STEMI with high-grade LAD and ramus CAD. History of submassive PE Hyperlipidemia GERD  Plan for full preoperative diagnostic workup.  Echocardiogram has been completed but result is currently pending.  These will be evaluated by the surgeon to determine candidacy and timing of surgery.  He does appear to be an acceptable candidate for CABG at this time.   I  spent 40 minutes  counseling the patient face to face.   John Giovanni, PA-C  03/11/2022 10:33 AM  Agree with above 67yo male with 2V CAD, and CHF 2/2 to STEMI.  An IABP was placed, and he currently is symptom free.  He has no significant valvular disease.  I have discussed the risks and benefits of surgical revascularization, and he is agreeable to proceed.  Corydon Schweiss Bary Leriche

## 2022-03-11 NOTE — Progress Notes (Signed)
  Echocardiogram 2D Echocardiogram has been performed.  Fidel Levy 03/11/2022, 10:09 AM

## 2022-03-11 NOTE — TOC Initial Note (Addendum)
Transition of Care [facility]) - Initial/Assessment Note    Patient Details  Name: Juan Snow MRN: [id number] Date of Birth: 02-Apr-1955  Transition of Care [facility]) CM/SW Contact:    Bethena Roys, RN Phone Number: 03/11/2022, 3:59 PM  Clinical Narrative:  Patient presented for chest pain-post cardiac cath. Plan for CABG 03-12-22. PTA patient was independent from home with spouse. Case Manager will continue to follow for transition of care needs as the patient progresses.                  Expected Discharge Plan: Home/Self Care Barriers to Discharge: Continued Medical Work up   Patient Goals and CMS Choice Patient states their goals for this hospitalization and ongoing recovery are:: to return home once stable.   Choice offered to / list presented to : NA      Expected Discharge Plan and Services In-house Referral: NA Discharge Planning Services: CM Consult Post Acute Care Choice: NA Living arrangements for the past 2 months: Single Family Home                 DME Arranged: N/A DME Agency: NA  Prior Living Arrangements/Services Living arrangements for the past 2 months: Single Family Home Lives with:: Spouse Patient language and need for interpreter reviewed:: Yes Do you feel safe going back to the place where you live?: Yes      Need for Family Participation in Patient Care: Yes (Comment) Care giver support system in place?: Yes (comment)   Criminal Activity/Legal Involvement Pertinent to Current Situation/Hospitalization: No - Comment as needed  Activities of Daily Living Home Assistive Devices/Equipment: None ADL Screening (condition at time of admission) Patient's cognitive ability adequate to safely complete daily activities?: Yes Is the patient deaf or have difficulty hearing?: No Does the patient have difficulty seeing, even when wearing glasses/contacts?: No Does the patient have difficulty concentrating, remembering, or making decisions?: No Patient  able to express need for assistance with ADLs?: No Does the patient have difficulty dressing or bathing?: No Independently performs ADLs?: Yes (appropriate for developmental age) Does the patient have difficulty walking or climbing stairs?: No Weakness of Legs: None Weakness of Arms/Hands: None  Permission Sought/Granted Permission sought to share information with : Family Supports, Case Manager   Emotional Assessment Appearance:: Appears stated age Attitude/Demeanor/Rapport: Engaged Affect (typically observed): Appropriate Orientation: : Oriented to Self, Oriented to Place, Oriented to  Time, Oriented to Situation Alcohol / Substance Use: Not Applicable Psych Involvement: No (comment)  Admission diagnosis:  STEMI (ST elevation myocardial infarction) ([place]) [I21.3] Patient Active Problem List   Diagnosis Date Noted   STEMI (ST elevation myocardial infarction) ([place]) 03/10/2022   Acute pulmonary embolism with acute cor pulmonale ([place]) 05/11/2021   Aspiration pneumonia ([place]) 03/28/2021   Severe sepsis ([place]) 03/28/2021   AKI (acute kidney injury) ([place]) 03/27/2021   Sepsis ([place]) 03/27/2021   Hypomagnesemia 03/27/2021   Chest pain 03/27/2021   Prolonged QT interval 03/27/2021   Gall bladder stones 03/27/2021   OA (osteoarthritis) of hip 06/25/2017   PCP:  Lurline Del, DO Pharmacy:   [facility] [address] [place] [address] Phone: [phone] Fax: [phone]  [id number] [place] [place]Phone: [phone]: [phone] [id number] [place]: [phone]: [phone]  [place] [address] Phone: [phone] Fax: [place], [place] [address] [place] [address] Phone: [phone] Fax: [phone]  Social Determinants of Health (SDOH) Social History: [place]: No Food Insecurity (03/10/2022)  Housing: Low Risk  (03/10/2022)  Transportation Needs: No Transportation Needs (03/10/2022)  Utilities: Not At Risk (03/10/2022)  Tobacco Use: Low Risk  (03/11/2022)   Readmission Risk Interventions     No data to display

## 2022-03-11 NOTE — Anesthesia Preprocedure Evaluation (Addendum)
Anesthesia Evaluation  Patient identified by MRN, date of birth, ID band Patient awake    Reviewed: Allergy & Precautions, NPO status , Patient's Chart, lab work & pertinent test results  Airway Mallampati: II  TM Distance: >3 FB Neck ROM: Full    Dental  (+) Partial Upper, Partial Lower, Dental Advisory Given   Pulmonary PE   Pulmonary exam normal        Cardiovascular + CAD and + Past MI  Normal cardiovascular exam+ dysrhythmias Ventricular Tachycardia   03/10/21 Cath Narrative   Ost LAD to Prox LAD lesion is 80% stenosed.   Ramus lesion is 80% stenosed.   1.  Successful intra-aortic balloon pump placement activated at 1:1 with resolution of unstable anginal symptoms. 2.  Unchanged burden of left-sided disease with TIMI-3 flow in all vessels.   Recommendation: Cardiothoracic surgical evaluation in the morning.  After discussion with pharmacy a heparin drip will be started later tonight.  TTE 03/11/21 IMPRESSIONS     1. Left ventricular ejection fraction, by estimation, is 25 to 30%. The  left ventricle has severely decreased function. The left ventricle  demonstrates regional wall motion abnormalities (see scoring  diagram/findings for description). Left ventricular  diastolic parameters are consistent with Grade II diastolic dysfunction  (pseudonormalization).   2. Right ventricular systolic function is normal. The right ventricular  size is normal. There is mildly elevated pulmonary artery systolic  pressure.   3. The mitral valve is normal in structure. Trivial mitral valve  regurgitation. No evidence of mitral stenosis.   4. The aortic valve is tricuspid. Aortic valve regurgitation is not  visualized. No aortic stenosis is present.   5. The inferior vena cava is normal in size with greater than 50%  respiratory variability, suggesting right atrial pressure of 3 mmHg.   Comparison(s): Prior images reviewed side by  side. The left ventricular  function is significantly worse. The right ventricular systolic function  has improved.     Neuro/Psych negative neurological ROS     GI/Hepatic negative GI ROS, Neg liver ROS,,,  Endo/Other  negative endocrine ROS    Renal/GU negative Renal ROS     Musculoskeletal negative musculoskeletal ROS (+)    Abdominal   Peds  Hematology negative hematology ROS (+)   Anesthesia Other Findings   Reproductive/Obstetrics                             Anesthesia Physical Anesthesia Plan  ASA: 4  Anesthesia Plan: General   Post-op Pain Management: Tylenol PO (pre-op)*   Induction: Intravenous  PONV Risk Score and Plan: 3 and Ondansetron, Dexamethasone and Midazolam  Airway Management Planned: Oral ETT  Additional Equipment: Arterial line, 3D TEE and PA Cath  Intra-op Plan:   Post-operative Plan: Post-operative intubation/ventilation  Informed Consent: I have reviewed the patients History and Physical, chart, labs and discussed the procedure including the risks, benefits and alternatives for the proposed anesthesia with the patient or authorized representative who has indicated his/her understanding and acceptance.     Dental advisory given  Plan Discussed with: Anesthesiologist and CRNA  Anesthesia Plan Comments:        Anesthesia Quick Evaluation

## 2022-03-11 NOTE — Progress Notes (Signed)
.  ANTICOAGULATION CONSULT NOTE - Follow Up Consult  Pharmacy Consult for heparin Indication: chest pain/ACS and DVT with history of PE  No Known Allergies  Patient Measurements: Height: '5\' 7"'$  (170.2 cm) Weight: 93.2 kg (205 lb 7.5 oz) IBW/kg (Calculated) : 66.1 Heparin Dosing Weight: 83.3 kg  Vital Signs: Temp: 98.9 F (37.2 C) (01/08 1547) Temp Source: Oral (01/08 1547) BP: 137/93 (01/08 1800) Pulse Rate: 84 (01/08 1800)  Labs: Recent Labs    03/10/22 1155 03/10/22 1430 03/10/22 2223 03/11/22 0611 03/11/22 1648  HGB 17.5*  --   --  16.5  --   HCT 52.3*  --   --  48.3  --   PLT 189  --   --  144*  --   APTT 44*  --  >200* 131* 50*  LABPROT 16.6*  --   --   --   --   INR 1.4*  --   --   --   --   HEPARINUNFRC  --   --   --  >1.10*  --   CREATININE 1.88*  --   --  1.22  --   TROPONINIHS 153* >24,000*  --   --   --      Estimated Creatinine Clearance: 65.7 mL/min (by C-G formula based on SCr of 1.22 mg/dL).  Assessment: Patient presented as code STEMI, coronary angiograph demonstrated a high-grade ostial LAD lesion with suspicion for ramus involvement. Cardiology decided on medical management at this time given TIMI-3 flow and resolution of symptoms. Deferring PCI for now. Patient had acute massive PE in March 2023 - treated with apixaban - stopped 09/2021, then found to have acute DVT in October treated with rivaroxaban PTA. Last dose of rivaroxaban PTA was 1/7 at 1000. Pharmacy consulted to dose heparin.   Patient underwent IABP 1:1 on 1/7. Received heparin 7000 units in lab for procedure and began low-dose heparin drip (800 units/hr) post-op. No bolus was administered given rivaroxaban dose earlier that morning.   PM update - aPTT now low at 50 after rate decrease this morning. Heparin level expected to not be accurate due to influence of Xarelto. PLTs down to 144 and Hgb WNL today. SCr improved to 1.22. No bleeding or issues with infusion per discussion with RN.  Goal  of Therapy:  aPTT 66-85 seconds (IABP) Heparin level 0.3-0.5 Monitor platelets by anticoagulation protocol: Yes   Plan:  Increase heparin infusion rate to 750 units/hr Repeat heparin level and aPTT in 8 hours Monitor daily heparin level and aPTT until correlating, CBC, s/sx bleeding CABG planned for 1/9   Arturo Morton, PharmD, BCPS Please check AMION for all [place] contact numbers Clinical Pharmacist 03/11/2022 6:04 PM

## 2022-03-11 NOTE — Progress Notes (Signed)
.  ANTICOAGULATION CONSULT NOTE - Follow Up Consult  Pharmacy Consult for heparin Indication: chest pain/ACS and DVT with history of PE  No Known Allergies  Patient Measurements: Height: '5\' 7"'$  (170.2 cm) Weight: 93.2 kg (205 lb 7.5 oz) IBW/kg (Calculated) : 66.1 Heparin Dosing Weight: 83.3 kg  Vital Signs: Temp: 98.1 F (36.7 C) (01/08 0303) Temp Source: Oral (01/08 0303) BP: 136/99 (01/08 0600) Pulse Rate: 79 (01/08 0645)  Labs: Recent Labs    03/10/22 1155 03/10/22 1430 03/10/22 2223 03/11/22 0611  HGB 17.5*  --   --  16.5  HCT 52.3*  --   --  48.3  PLT 189  --   --  144*  APTT 44*  --  >200* 131*  LABPROT 16.6*  --   --   --   INR 1.4*  --   --   --   HEPARINUNFRC  --   --   --  >1.10*  CREATININE 1.88*  --   --  1.22  TROPONINIHS 153* >24,000*  --   --     Estimated Creatinine Clearance: 65.7 mL/min (by C-G formula based on SCr of 1.22 mg/dL).  Assessment: Patient presented as code STEMI, coronary angiograph demonstrated a high-grade ostial LAD lesion with suspicion for ramus involvement. Cardiology decided on medical management at this time given TIMI-3 flow and resolution of symptoms. Deferring PCI for now. Patient had acute massive PE in March 2023 - treated with apixaban - stopped 09/2021, then found to have acute DVT in October treated with rivaroxaban PTA. Last dose of rivaroxaban PTA was 1/7 at 1000. Pharmacy consulted to dose heparin.   Patient underwent IABP 1:1 on 1/7. Received heparin 7000 units in lab for procedure and began low-dose heparin drip (800 units/hr) post-op. No bolus was administered given rivaroxaban dose earlier that morning.   Today, aPTT is supratherapeutic at 131 and heparin level is >1.1. PLTs 144 and Hgb 16.5. No documented issues with bleeding or heparin infusion, confirmed with RN at bedside.  Goal of Therapy:  aPTT 66-102 seconds Monitor platelets by anticoagulation protocol: Yes   Plan:  Decrease heparin infusion rate to 600  units/hr Repeat heparin level and aPTT in 8 hours Monitor s/sx of bleeding, platelets, and hemoglobin daily  Freddie Breech 03/11/2022,7:10 AM

## 2022-03-11 NOTE — Plan of Care (Signed)
  Problem: Education: Goal: Knowledge of General Education information will improve Description: Including pain rating scale, medication(s)/side effects and non-pharmacologic comfort measures Outcome: Progressing   Problem: Health Behavior/Discharge Planning: Goal: Ability to manage health-related needs will improve Outcome: Progressing   Problem: Clinical Measurements: Goal: Ability to maintain clinical measurements within normal limits will improve Outcome: Progressing Goal: Will remain free from infection Outcome: Progressing Goal: Diagnostic test results will improve Outcome: Progressing Goal: Respiratory complications will improve Outcome: Progressing Goal: Cardiovascular complication will be avoided Outcome: Progressing   Problem: Activity: Goal: Risk for activity intolerance will decrease Outcome: Progressing   Problem: Nutrition: Goal: Adequate nutrition will be maintained Outcome: Progressing   Problem: Elimination: Goal: Will not experience complications related to bowel motility Outcome: Progressing Goal: Will not experience complications related to urinary retention Outcome: Progressing   Problem: Pain Managment: Goal: General experience of comfort will improve Outcome: Progressing   Problem: Activity: Goal: Ability to return to baseline activity level will improve Outcome: Progressing   Problem: Cardiovascular: Goal: Ability to achieve and maintain adequate cardiovascular perfusion will improve Outcome: Progressing Goal: Vascular access site(s) Level 0-1 will be maintained Outcome: Progressing

## 2022-03-11 NOTE — Progress Notes (Addendum)
Rounding Note    Patient Name: Juan Snow Date of Encounter: 03/11/2022  [place] Cardiologist: None   Subjective   He is having some mild chest pressure and dyspnea this morning and is overall tired. We discussed his cath results and the plan to get an echo as well as consult CT surgery. He understands   Inpatient Medications    Scheduled Meds:  aspirin  81 mg Oral Daily   atorvastatin  80 mg Oral Daily   Chlorhexidine Gluconate Cloth  6 each Topical Daily   furosemide  20 mg Intravenous Once   melatonin  3 mg Oral QHS   mupirocin ointment  1 Application Nasal BID   pantoprazole  40 mg Oral Daily   sodium chloride flush  3 mL Intravenous Q12H   Continuous Infusions:  sodium chloride     sodium chloride     heparin 600 Units/hr (03/11/22 0900)   nitroGLYCERIN     PRN Meds: sodium chloride, acetaminophen, alum & mag hydroxide-simeth, morphine injection, nitroGLYCERIN, mouth rinse, simethicone, sodium chloride flush   Vital Signs    Vitals:   03/11/22 0700 03/11/22 0741 03/11/22 0800 03/11/22 0900  BP: (!) 117/95  (!) 131/90 134/74  Pulse: 77 76 78 78  Resp:      Temp:  98.5 F (36.9 C)    TempSrc:  Oral    SpO2: 93% 93% 93% 93%  Weight:      Height:        Intake/Output Summary (Last 24 hours) at 03/11/2022 0932 Last data filed at 03/11/2022 0900 Gross per 24 hour  Intake 529.44 ml  Output 650 ml  Net -120.56 ml      03/11/2022    3:03 AM 03/10/2022    2:00 PM 03/10/2022   12:00 PM  Last 3 Weights  Weight (lbs) 205 lb 7.5 oz 187 lb 6.3 oz 187 lb 6.3 oz  Weight (kg) 93.2 kg 85 kg 85 kg      Telemetry    NSR, rare PVCs, no return of NSVT - Personally Reviewed  ECG    acute anterolateral ST elevation myocardial infarction - Personally Reviewed  Physical Exam   GEN: No acute distress.   Neck: No JVD Cardiac: RRR, right radial and femoral access without abnormalities  Respiratory: Clear to auscultation bilaterally. On 4 L [place]. GI:  Soft, nontender, non-distended  MS: Trace LE edema; No deformity. Neuro:  Nonfocal  Psych: Normal affect   Labs    High Sensitivity Troponin:   Recent Labs  Lab 03/10/22 1155 03/10/22 1430  TROPONINIHS 153* >24,000*     Chemistry Recent Labs  Lab 03/10/22 1155 03/11/22 0611  NA 139 133*  K 3.1* 4.0  CL 105 102  CO2 18* 20*  GLUCOSE 130* 150*  BUN 20 15  CREATININE 1.88* 1.22  CALCIUM 9.1 8.4*  MG  --  1.9  PROT 7.0  --   ALBUMIN 3.7  --   AST 26  --   ALT 24  --   ALKPHOS 48  --   BILITOT 0.5  --   GFRNONAA 39* >60  ANIONGAP 16* 11    Lipids  Recent Labs  Lab 03/10/22 1155  CHOL 210*  TRIG 239*  HDL 41  LDLCALC 121*  CHOLHDL 5.1    Hematology Recent Labs  Lab 03/10/22 1155 03/11/22 0611  WBC 7.6 12.1*  RBC 5.75 5.44  HGB 17.5* 16.5  HCT 52.3* 48.3  MCV 91.0 88.8  MCH 30.4 30.3  MCHC 33.5 34.2  RDW 13.8 14.3  PLT 189 144*   Thyroid No results for input(s): "TSH", "FREET4" in the last 168 hours.  BNPNo results for input(s): "BNP", "PROBNP" in the last 168 hours.  DDimer No results for input(s): "DDIMER" in the last 168 hours.   Radiology    DG Chest Port 1 View  Result Date: 03/11/2022 CLINICAL DATA:  Intra-aortic balloon pump. EXAM: PORTABLE CHEST 1 VIEW COMPARISON:  03/10/2022 and CT chest 05/11/2021. FINDINGS: Trachea is midline. Heart size stable. Intra-aortic balloon pump tip projects over the proximal descending thoracic aorta. Defibrillator pads are in place along the left chest. Lungs are somewhat low in volume with bibasilar streaky atelectasis. Right upper lobe scarring. There may be a small right pleural effusion. IMPRESSION: 1. Stable intra-aortic balloon pump position. 2. Low lung volumes with scattered atelectasis. 3. Probable small right pleural effusion. Electronically Signed   By: Lorin Picket M.D.   On: 03/11/2022 08:14   CARDIAC CATHETERIZATION  Addendum Date: 03/11/2022     Ost LAD to Prox LAD lesion is 80% stenosed.   Ramus  lesion is 80% stenosed. 1.  High-grade ostial LAD lesion and suspected high-grade ostial ramus lesion.  Multiple viewing angles were employed to try to separate the ostium of the ramus from the ostium of the LAD.  This could not be accomplished.  Given that the patient has TIMI-3 flow in all vessels, percutaneous intervention was deferred.  Continue medical management for now and the best revascularization strategy will be determined.  Given the lack of adequate viewing angles, surgical revascularization may be the best strategy. 2.  Ventriculography demonstrated an anterior wall motion abnormality with an LVEDP of 22 mmHg. Recommendation: Since the patient's last dose of Xarelto was earlier this morning, will start heparin drip tomorrow per pharmacy.  If the patient develops recurrent chest pain not responsive to medical therapy, will consider intra-aortic balloon pump placement.  Obtain echocardiogram and cardiothoracic surgery consult tomorrow.  This was discussed with the patient and his family.  Result Date: 03/11/2022   Ost LAD to Prox LAD lesion is 80% stenosed.   Ramus lesion is 80% stenosed. 1.  High-grade ostial LAD lesion and suspected high-grade ostial ramus lesion.  Multiple viewing angles were employed to try to separate the ostium of the ramus from the ostium of the LAD.  This could not be accomplished.  Given that the patient has TIMI-3 flow intervention was deferred.  The patient will be treated medically.  Revascularization strategy will be determined.  Given the lack of adequate viewing angles surgical revascularization may be the best strategy. 2.  Ventriculography demonstrated an anterior wall motion abnormality with an LVEDP of 22 mmHg. Recommendation: Since the patient's last dose of Xarelto was earlier this morning, will start heparin drip tomorrow per pharmacy.  If the patient develops recurrent chest pain not responsive to medical therapy will consider intra-aortic balloon pump placement.   Obtain echocardiogram and cardiothoracic surgery consult.  This was discussed with the patient and his family.   DG Chest Port 1 View  Result Date: 03/10/2022 CLINICAL DATA:  Intra-aortic balloon pump. EXAM: PORTABLE CHEST 1 VIEW COMPARISON:  03/10/2022. FINDINGS: The heart size and mediastinal contours are stable. A intra-aortic balloon pump radiopaque marker is noted in the anticipated region of the proximal descending aorta. Lung volumes are low with vascular crowding. Atelectasis is present at the lung bases. There is a small left pleural effusion. No pneumothorax. No acute osseous  abnormality. IMPRESSION: 1. Intra-aortic balloon pump radiopaque marker in the anticipated region of the proximal descending aorta. 2. Low lung volumes with mild atelectasis at the lung bases. 3. Small left pleural effusion. Electronically Signed   By: Brett Fairy M.D.   On: 03/10/2022 22:44   CARDIAC CATHETERIZATION  Result Date: 03/10/2022   Ost LAD to Prox LAD lesion is 80% stenosed.   Ramus lesion is 80% stenosed. 1.  Successful intra-aortic balloon pump placement activated at 1:1 with resolution of unstable anginal symptoms. 2.  Unchanged burden of left-sided disease with TIMI-3 flow in all vessels. Recommendation: Cardiothoracic surgical evaluation in the morning.  After discussion with pharmacy a heparin drip will be started later tonight.   DG Chest Port 1 View  Result Date: 03/10/2022 CLINICAL DATA:  Shortness of breath.  Chest pain. EXAM: PORTABLE CHEST 1 VIEW COMPARISON:  May 11, 2021 FINDINGS: The cardiomediastinal silhouette is stable. No pneumothorax. Mild interstitial prominence. No suspicious infiltrate. IMPRESSION: Bilateral interstitial prominence suggests pulmonary venous congestion/mild edema given history. Electronically Signed   By: Dorise Bullion III M.D.   On: 03/10/2022 12:41    Cardiac Studies   LHC w/ angio and IABP Insertion 03/10/2022   Ost LAD to Prox LAD lesion is 80% stenosed.   Ramus  lesion is 80% stenosed.   1.  High-grade ostial LAD lesion and suspected high-grade ostial ramus lesion.  Multiple viewing angles were employed to try to separate the ostium of the ramus from the ostium of the LAD.  This could not be accomplished.  Given that the patient has TIMI-3 flow in all vessels, percutaneous intervention was deferred.  Continue medical management for now and the best revascularization strategy will be determined.  Given the lack of adequate viewing angles, surgical revascularization may be the best strategy. 2.  Ventriculography demonstrated an anterior wall motion abnormality with an LVEDP of 22 mmHg. 1.  Successful intra-aortic balloon pump placement activated at 1:1 with resolution of unstable anginal symptoms. 2.  Unchanged burden of left-sided disease with TIMI-3 flow in all vessels.    Echo 08/15/2021 IMPRESSIONS     1. Left ventricular ejection fraction, by estimation, is 60 to 65%. The  left ventricle has normal function. The left ventricle has no regional  wall motion abnormalities. There is mild left ventricular hypertrophy.  Left ventricular diastolic parameters  are consistent with Grade I diastolic dysfunction (impaired relaxation).   2. Right ventricular systolic function is normal. The right ventricular  size is normal. There is normal pulmonary artery systolic pressure. The  estimated right ventricular systolic pressure is 16.1 mmHg.   3. The aortic valve is tricuspid. Aortic valve regurgitation is not  visualized.   4. No evidence of "saddle" PE, the main PA bifrucation is visualized.   5. The inferior vena cava is normal in size with greater than 50%  respiratory variability, suggesting right atrial pressure of 3 mmHg.   Comparison(s): Changes from prior study are noted. 05/11/2021: LVEF 50-55%,  moderate RV dysfuction with positive McConnell's sign. Compared to the  prior study, the RV systolic dysfunction has resolved.    Patient Profile      67 y.o. male with history of submassive PE March  2023 treated with catheter directed thrombolysis currently on Xarelto, GERD, hyperlipidemia and arthritis who is being seen 03/10/2022 for the evaluation of chest pain and found to have large anterolateral STEMI.  Assessment & Plan    Acute elevation myocardial infarction:  LHC with high-grade ostial LAD  lesion as well as likely ramus involvement. Anterior wall motion abnormality with LVEDP of 22 mmHg. Deferred PCI and proceeded with intra-aortic balloon pump insertion. Heparin gtt and aspirin. Pending echo. Consult CTS today for revascularization. Lasix 20 mg IV today for dyspnea, IABP working well. No recurrence of NSVT on telemetry overnight. History of submassive PE:  The patient is on Xarelto despite a submassive PE in March. Held on admission, heparin gtt above. This is typically treated with 6 months of anticoagulation.  Will need to determine whether he requires extended dosing of his Xarelto. Will revisit pending CTS evaluation.  Hyperlipidemia: Atorvastatin 80 mg daily GERD: PPI.     For questions or updates, please contact [place] Please consult [email or web address] for contact info under        Signed, Johny Blamer, DO  03/11/2022, 9:32 AM    Internal Medicine Resident, PGY-1 Pager# [phone]   Patient seen, examined. Available data reviewed. Agree with findings, assessment, and plan as outlined by Johny Blamer, DO.  The patient is independently interviewed and examined.  He is alert, oriented, in no distress.  HEENT is normal, JVP is normal, lungs are clear bilaterally, heart is regular rate and rhythm with no murmur gallop, abdomen is soft and nontender, right groin site is clear with an intra-aortic balloon pump in place through right femoral access, there is trace pretibial edema, skin is warm and dry with no rash.  The patient's cardiac catheterization films are reviewed and he has tight stenosis at the ostium of  the LAD which is his culprit lesion for acute STEMI.  He appears to have had a large anterior injury based on my review of his left ventriculogram and troponin greater than 24,000.  He has stabilized with placement of an intra-aortic balloon pump.  I am going to give him furosemide 20 mg IV x 1 (patient Lasix nave).  This is for treatment of acute systolic heart failure related to his MI.  I have placed a cardiac surgical consultation for consideration of CABG.  The patient's last Xarelto dose was yesterday morning.  His wife is updated at the bedside.  Will hold on ACE/ARB as he is pending surgery.  Will see how he responds to IV furosemide from a hemodynamic perspective before starting him on GDMT.  Await 2D echo results.  Continue IV heparin.  The patient is critically ill with multiple organ systems failure and requires high complexity decision making for assessment and support, frequent evaluation and titration of therapies, application of advanced monitoring technologies and extensive interpretation of multiple databases.   Critical Care Time devoted to patient care services described in this note is 40 minutes.   Sherren Mocha, M.D. 03/11/2022 10:39 AM

## 2022-03-11 NOTE — TOC Benefit Eligibility Note (Signed)
Patient Teacher, English as a foreign language completed.    The patient is currently admitted and upon discharge could be taking Entresto 24-26 mg.  The current 30 day co-pay is $455.00 due to a $410.00 deductible.  Will be $45.00 once deductible is met.   The patient is currently admitted and upon discharge could be taking Farxiga 10 mg.  The current 30 day co-pay is $455.00 due to a $410.00 deductible.  Will be $45.00 once deductible is met.   The patient is currently admitted and upon discharge could be taking Jardiance 10 mg.  The current 30 day co-pay is $455.00 due to a $410.00 deductible.  Will be $45.00 once deductible is met.   The patient is insured through [place] Patient Advocate Specialist [place] Patient Advocate Team Direct Number: [phone]  Fax: [phone]

## 2022-03-12 ENCOUNTER — Other Ambulatory Visit: Payer: Self-pay

## 2022-03-12 ENCOUNTER — Inpatient Hospital Stay (HOSPITAL_COMMUNITY): Payer: Medicare Other | Admitting: Anesthesiology

## 2022-03-12 ENCOUNTER — Inpatient Hospital Stay (HOSPITAL_COMMUNITY): Payer: Medicare Other

## 2022-03-12 ENCOUNTER — Inpatient Hospital Stay (HOSPITAL_COMMUNITY)
Admission: EM | Disposition: A | Discharge status: Home Health | Payer: Self-pay | Source: Home / Self Care | Attending: Thoracic Surgery (Cardiothoracic Vascular Surgery)

## 2022-03-12 DIAGNOSIS — R570 Cardiogenic shock: Secondary | ICD-10-CM | POA: Diagnosis not present

## 2022-03-12 DIAGNOSIS — I5021 Acute systolic (congestive) heart failure: Secondary | ICD-10-CM

## 2022-03-12 DIAGNOSIS — I2510 Atherosclerotic heart disease of native coronary artery without angina pectoris: Secondary | ICD-10-CM

## 2022-03-12 DIAGNOSIS — I214 Non-ST elevation (NSTEMI) myocardial infarction: Secondary | ICD-10-CM

## 2022-03-12 DIAGNOSIS — I252 Old myocardial infarction: Secondary | ICD-10-CM | POA: Diagnosis not present

## 2022-03-12 DIAGNOSIS — I472 Ventricular tachycardia, unspecified: Secondary | ICD-10-CM

## 2022-03-12 DIAGNOSIS — I509 Heart failure, unspecified: Secondary | ICD-10-CM

## 2022-03-12 DIAGNOSIS — I2102 ST elevation (STEMI) myocardial infarction involving left anterior descending coronary artery: Secondary | ICD-10-CM | POA: Diagnosis not present

## 2022-03-12 HISTORY — PX: TEE WITHOUT CARDIOVERSION: SHX5443

## 2022-03-12 HISTORY — PX: CORONARY ARTERY BYPASS GRAFT: SHX141

## 2022-03-12 LAB — POCT I-STAT 7, (LYTES, BLD GAS, ICA,H+H)
Acid-base deficit: 4 mmol/L — ABNORMAL HIGH (ref 0.0–2.0)
Acid-base deficit: 3 mmol/L — ABNORMAL HIGH (ref 0.0–2.0)
Acid-base deficit: 3 mmol/L — ABNORMAL HIGH (ref 0.0–2.0)
Acid-base deficit: 6 mmol/L — ABNORMAL HIGH (ref 0.0–2.0)
Acid-base deficit: 7 mmol/L — ABNORMAL HIGH (ref 0.0–2.0)
Acid-base deficit: 6 mmol/L — ABNORMAL HIGH (ref 0.0–2.0)
Bicarbonate: 21 mmol/L (ref 20.0–28.0)
Bicarbonate: 22.5 mmol/L (ref 20.0–28.0)
Bicarbonate: 22.2 mmol/L (ref 20.0–28.0)
Bicarbonate: 21.2 mmol/L (ref 20.0–28.0)
Bicarbonate: 21.7 mmol/L (ref 20.0–28.0)
Bicarbonate: 19.4 mmol/L — ABNORMAL LOW (ref 20.0–28.0)
Calcium, Ion: 1.23 mmol/L (ref 1.15–1.40)
Calcium, Ion: 0.99 mmol/L — ABNORMAL LOW (ref 1.15–1.40)
Calcium, Ion: 1.03 mmol/L — ABNORMAL LOW (ref 1.15–1.40)
Calcium, Ion: 1.4 mmol/L (ref 1.15–1.40)
Calcium, Ion: 1.36 mmol/L (ref 1.15–1.40)
Calcium, Ion: 1.2 mmol/L (ref 1.15–1.40)
HCT: 44 % (ref 39.0–52.0)
HCT: 32 % — ABNORMAL LOW (ref 39.0–52.0)
HCT: 31 % — ABNORMAL LOW (ref 39.0–52.0)
HCT: 34 % — ABNORMAL LOW (ref 39.0–52.0)
HCT: 34 % — ABNORMAL LOW (ref 39.0–52.0)
HCT: 31 % — ABNORMAL LOW (ref 39.0–52.0)
Hemoglobin: 15 g/dL (ref 13.0–17.0)
Hemoglobin: 10.9 g/dL — ABNORMAL LOW (ref 13.0–17.0)
Hemoglobin: 10.5 g/dL — ABNORMAL LOW (ref 13.0–17.0)
Hemoglobin: 11.6 g/dL — ABNORMAL LOW (ref 13.0–17.0)
Hemoglobin: 11.6 g/dL — ABNORMAL LOW (ref 13.0–17.0)
Hemoglobin: 10.5 g/dL — ABNORMAL LOW (ref 13.0–17.0)
O2 Saturation: 97 %
O2 Saturation: 100 %
O2 Saturation: 100 %
O2 Saturation: 97 %
O2 Saturation: 98 %
O2 Saturation: 99 %
Patient temperature: 36.7
Patient temperature: 36.6
Potassium: 4 mmol/L (ref 3.5–5.1)
Potassium: 4.4 mmol/L (ref 3.5–5.1)
Potassium: 5.5 mmol/L — ABNORMAL HIGH (ref 3.5–5.1)
Potassium: 4.3 mmol/L (ref 3.5–5.1)
Potassium: 4.4 mmol/L (ref 3.5–5.1)
Potassium: 4.1 mmol/L (ref 3.5–5.1)
Sodium: 135 mmol/L (ref 135–145)
Sodium: 137 mmol/L (ref 135–145)
Sodium: 135 mmol/L (ref 135–145)
Sodium: 137 mmol/L (ref 135–145)
Sodium: 137 mmol/L (ref 135–145)
Sodium: 135 mmol/L (ref 135–145)
TCO2: 22 mmol/L (ref 22–32)
TCO2: 24 mmol/L (ref 22–32)
TCO2: 23 mmol/L (ref 22–32)
TCO2: 23 mmol/L (ref 22–32)
TCO2: 23 mmol/L (ref 22–32)
TCO2: 20 mmol/L — ABNORMAL LOW (ref 22–32)
pCO2 arterial: 38.7 mmHg (ref 32–48)
pCO2 arterial: 42.5 mmHg (ref 32–48)
pCO2 arterial: 37 mmHg (ref 32–48)
pCO2 arterial: 46.4 mmHg (ref 32–48)
pCO2 arterial: 56.6 mmHg — ABNORMAL HIGH (ref 32–48)
pCO2 arterial: 34.4 mmHg (ref 32–48)
pH, Arterial: 7.342 — ABNORMAL LOW (ref 7.35–7.45)
pH, Arterial: 7.332 — ABNORMAL LOW (ref 7.35–7.45)
pH, Arterial: 7.385 (ref 7.35–7.45)
pH, Arterial: 7.268 — ABNORMAL LOW (ref 7.35–7.45)
pH, Arterial: 7.189 — CL (ref 7.35–7.45)
pH, Arterial: 7.357 (ref 7.35–7.45)
pO2, Arterial: 98 mmHg (ref 83–108)
pO2, Arterial: 346 mmHg — ABNORMAL HIGH (ref 83–108)
pO2, Arterial: 285 mmHg — ABNORMAL HIGH (ref 83–108)
pO2, Arterial: 107 mmHg (ref 83–108)
pO2, Arterial: 137 mmHg — ABNORMAL HIGH (ref 83–108)
pO2, Arterial: 164 mmHg — ABNORMAL HIGH (ref 83–108)

## 2022-03-12 LAB — POCT I-STAT, CHEM 8
BUN: 16 mg/dL (ref 8–23)
BUN: 16 mg/dL (ref 8–23)
BUN: 15 mg/dL (ref 8–23)
BUN: 15 mg/dL (ref 8–23)
Calcium, Ion: 1.22 mmol/L (ref 1.15–1.40)
Calcium, Ion: 1.27 mmol/L (ref 1.15–1.40)
Calcium, Ion: 1.04 mmol/L — ABNORMAL LOW (ref 1.15–1.40)
Calcium, Ion: 1.36 mmol/L (ref 1.15–1.40)
Chloride: 101 mmol/L (ref 98–111)
Chloride: 103 mmol/L (ref 98–111)
Chloride: 102 mmol/L (ref 98–111)
Chloride: 103 mmol/L (ref 98–111)
Creatinine, Ser: 1.3 mg/dL — ABNORMAL HIGH (ref 0.61–1.24)
Creatinine, Ser: 1.2 mg/dL (ref 0.61–1.24)
Creatinine, Ser: 1.3 mg/dL — ABNORMAL HIGH (ref 0.61–1.24)
Creatinine, Ser: 1.3 mg/dL — ABNORMAL HIGH (ref 0.61–1.24)
Glucose, Bld: 144 mg/dL — ABNORMAL HIGH (ref 70–99)
Glucose, Bld: 133 mg/dL — ABNORMAL HIGH (ref 70–99)
Glucose, Bld: 131 mg/dL — ABNORMAL HIGH (ref 70–99)
Glucose, Bld: 156 mg/dL — ABNORMAL HIGH (ref 70–99)
HCT: 43 % (ref 39.0–52.0)
HCT: 40 % (ref 39.0–52.0)
HCT: 30 % — ABNORMAL LOW (ref 39.0–52.0)
HCT: 33 % — ABNORMAL LOW (ref 39.0–52.0)
Hemoglobin: 14.6 g/dL (ref 13.0–17.0)
Hemoglobin: 13.6 g/dL (ref 13.0–17.0)
Hemoglobin: 10.2 g/dL — ABNORMAL LOW (ref 13.0–17.0)
Hemoglobin: 11.2 g/dL — ABNORMAL LOW (ref 13.0–17.0)
Potassium: 4 mmol/L (ref 3.5–5.1)
Potassium: 4.4 mmol/L (ref 3.5–5.1)
Potassium: 5.2 mmol/L — ABNORMAL HIGH (ref 3.5–5.1)
Potassium: 4.2 mmol/L (ref 3.5–5.1)
Sodium: 135 mmol/L (ref 135–145)
Sodium: 136 mmol/L (ref 135–145)
Sodium: 135 mmol/L (ref 135–145)
Sodium: 137 mmol/L (ref 135–145)
TCO2: 22 mmol/L (ref 22–32)
TCO2: 22 mmol/L (ref 22–32)
TCO2: 28 mmol/L (ref 22–32)
TCO2: 22 mmol/L (ref 22–32)

## 2022-03-12 LAB — CBC
HCT: 51.7 % (ref 39.0–52.0)
HCT: 36 % — ABNORMAL LOW (ref 39.0–52.0)
HCT: 32.5 % — ABNORMAL LOW (ref 39.0–52.0)
Hemoglobin: 17.5 g/dL — ABNORMAL HIGH (ref 13.0–17.0)
Hemoglobin: 11.7 g/dL — ABNORMAL LOW (ref 13.0–17.0)
Hemoglobin: 11.5 g/dL — ABNORMAL LOW (ref 13.0–17.0)
MCH: 30.1 pg (ref 26.0–34.0)
MCH: 30.4 pg (ref 26.0–34.0)
MCH: 31.6 pg (ref 26.0–34.0)
MCHC: 33.8 g/dL (ref 30.0–36.0)
MCHC: 32.5 g/dL (ref 30.0–36.0)
MCHC: 35.4 g/dL (ref 30.0–36.0)
MCV: 89 fL (ref 80.0–100.0)
MCV: 93.5 fL (ref 80.0–100.0)
MCV: 89.3 fL (ref 80.0–100.0)
Platelets: 111 10*3/uL — ABNORMAL LOW (ref 150–400)
Platelets: 60 10*3/uL — ABNORMAL LOW (ref 150–400)
Platelets: 64 10*3/uL — ABNORMAL LOW (ref 150–400)
RBC: 5.81 MIL/uL (ref 4.22–5.81)
RBC: 3.85 MIL/uL — ABNORMAL LOW (ref 4.22–5.81)
RBC: 3.64 MIL/uL — ABNORMAL LOW (ref 4.22–5.81)
RDW: 14.2 % (ref 11.5–15.5)
RDW: 14 % (ref 11.5–15.5)
RDW: 13.4 % (ref 11.5–15.5)
WBC: 12.2 10*3/uL — ABNORMAL HIGH (ref 4.0–10.5)
WBC: 11.4 10*3/uL — ABNORMAL HIGH (ref 4.0–10.5)
WBC: 11 10*3/uL — ABNORMAL HIGH (ref 4.0–10.5)
nRBC: 0 % (ref 0.0–0.2)
nRBC: 0 % (ref 0.0–0.2)
nRBC: 0 % (ref 0.0–0.2)

## 2022-03-12 LAB — BASIC METABOLIC PANEL
Anion gap: 12 (ref 5–15)
Anion gap: 11 (ref 5–15)
BUN: 15 mg/dL (ref 8–23)
BUN: 16 mg/dL (ref 8–23)
CO2: 21 mmol/L — ABNORMAL LOW (ref 22–32)
CO2: 20 mmol/L — ABNORMAL LOW (ref 22–32)
Calcium: 8.7 mg/dL — ABNORMAL LOW (ref 8.9–10.3)
Calcium: 8.1 mg/dL — ABNORMAL LOW (ref 8.9–10.3)
Chloride: 101 mmol/L (ref 98–111)
Chloride: 103 mmol/L (ref 98–111)
Creatinine, Ser: 1.49 mg/dL — ABNORMAL HIGH (ref 0.61–1.24)
Creatinine, Ser: 1.54 mg/dL — ABNORMAL HIGH (ref 0.61–1.24)
GFR, Estimated: 52 mL/min — ABNORMAL LOW (ref >60)
GFR, Estimated: 50 mL/min — ABNORMAL LOW (ref >60)
Glucose, Bld: 132 mg/dL — ABNORMAL HIGH (ref 70–99)
Glucose, Bld: 198 mg/dL — ABNORMAL HIGH (ref 70–99)
Potassium: 4 mmol/L (ref 3.5–5.1)
Potassium: 4.2 mmol/L (ref 3.5–5.1)
Sodium: 134 mmol/L — ABNORMAL LOW (ref 135–145)
Sodium: 134 mmol/L — ABNORMAL LOW (ref 135–145)

## 2022-03-12 LAB — POCT I-STAT EG7
Acid-Base Excess: 2 mmol/L (ref 0.0–2.0)
Bicarbonate: 28.9 mmol/L — ABNORMAL HIGH (ref 20.0–28.0)
Calcium, Ion: 1.02 mmol/L — ABNORMAL LOW (ref 1.15–1.40)
HCT: 32 % — ABNORMAL LOW (ref 39.0–52.0)
Hemoglobin: 10.9 g/dL — ABNORMAL LOW (ref 13.0–17.0)
O2 Saturation: 81 %
Potassium: 4 mmol/L (ref 3.5–5.1)
Sodium: 141 mmol/L (ref 135–145)
TCO2: 30 mmol/L (ref 22–32)
pCO2, Ven: 54.1 mmHg (ref 44–60)
pH, Ven: 7.335 (ref 7.25–7.43)
pO2, Ven: 49 mmHg — ABNORMAL HIGH (ref 32–45)

## 2022-03-12 LAB — GLUCOSE, CAPILLARY
Glucose-Capillary: 152 mg/dL — ABNORMAL HIGH (ref 70–99)
Glucose-Capillary: 147 mg/dL — ABNORMAL HIGH (ref 70–99)
Glucose-Capillary: 197 mg/dL — ABNORMAL HIGH (ref 70–99)
Glucose-Capillary: 204 mg/dL — ABNORMAL HIGH (ref 70–99)
Glucose-Capillary: 205 mg/dL — ABNORMAL HIGH (ref 70–99)
Glucose-Capillary: 207 mg/dL — ABNORMAL HIGH (ref 70–99)
Glucose-Capillary: 201 mg/dL — ABNORMAL HIGH (ref 70–99)
Glucose-Capillary: 209 mg/dL — ABNORMAL HIGH (ref 70–99)
Glucose-Capillary: 216 mg/dL — ABNORMAL HIGH (ref 70–99)
Glucose-Capillary: 200 mg/dL — ABNORMAL HIGH (ref 70–99)

## 2022-03-12 LAB — HEMOGLOBIN AND HEMATOCRIT, BLOOD
HCT: 33.2 % — ABNORMAL LOW (ref 39.0–52.0)
Hemoglobin: 11.1 g/dL — ABNORMAL LOW (ref 13.0–17.0)

## 2022-03-12 LAB — COOXEMETRY PANEL
Carboxyhemoglobin: 1.9 % — ABNORMAL HIGH (ref 0.5–1.5)
Methemoglobin: <0.7 % (ref 0.0–1.5)
O2 Saturation: 74 %
Total hemoglobin: 11.9 g/dL — ABNORMAL LOW (ref 12.0–16.0)

## 2022-03-12 LAB — HEPARIN LEVEL (UNFRACTIONATED): Heparin Unfractionated: >1.1 IU/mL — ABNORMAL HIGH (ref 0.30–0.70)

## 2022-03-12 LAB — MAGNESIUM
Magnesium: 1.9 mg/dL (ref 1.7–2.4)
Magnesium: 2.2 mg/dL (ref 1.7–2.4)

## 2022-03-12 LAB — APTT
aPTT: 55 seconds — ABNORMAL HIGH (ref 24–36)
aPTT: 37 seconds — ABNORMAL HIGH (ref 24–36)

## 2022-03-12 LAB — PROTIME-INR
INR: 1.6 — ABNORMAL HIGH (ref 0.8–1.2)
Prothrombin Time: 19.2 seconds — ABNORMAL HIGH (ref 11.4–15.2)

## 2022-03-12 LAB — ECHO INTRAOPERATIVE TEE
Height: 67 in
Weight: 3082.91 oz

## 2022-03-12 LAB — LIPOPROTEIN A (LPA): Lipoprotein (a): 97.4 nmol/L — ABNORMAL HIGH (ref <75.0)

## 2022-03-12 LAB — PLATELET COUNT: Platelets: 92 10*3/uL — ABNORMAL LOW (ref 150–400)

## 2022-03-12 SURGERY — CORONARY ARTERY BYPASS GRAFTING (CABG)
Anesthesia: General | Site: Esophagus

## 2022-03-12 MED ORDER — CEFAZOLIN SODIUM-DEXTROSE 2-4 GM/100ML-% IV SOLN
2.0000 g | Freq: Three times a day (TID) | INTRAVENOUS | Status: AC
  Administered 2022-03-12 – 2022-03-14 (×6): 2 g via INTRAVENOUS
  Filled 2022-03-12 (×6): qty 100

## 2022-03-12 MED ORDER — BISACODYL 5 MG PO TBEC
10.0000 mg | DELAYED_RELEASE_TABLET | Freq: Every day | ORAL | Status: DC
  Administered 2022-03-14: 10 mg via ORAL
  Filled 2022-03-12 (×2): qty 2

## 2022-03-12 MED ORDER — PROPOFOL 10 MG/ML IV BOLUS
INTRAVENOUS | Status: AC
  Filled 2022-03-12: qty 20

## 2022-03-12 MED ORDER — MIDAZOLAM HCL 2 MG/2ML IJ SOLN
INTRAMUSCULAR | Status: AC
  Filled 2022-03-12: qty 2

## 2022-03-12 MED ORDER — MORPHINE SULFATE (PF) 2 MG/ML IV SOLN
1.0000 mg | INTRAVENOUS | Status: DC | PRN
  Administered 2022-03-12: 2 mg via INTRAVENOUS
  Administered 2022-03-12: 4 mg via INTRAVENOUS
  Administered 2022-03-12 (×2): 2 mg via INTRAVENOUS
  Administered 2022-03-13 – 2022-03-16 (×5): 4 mg via INTRAVENOUS
  Administered 2022-03-16: 2 mg via INTRAVENOUS
  Filled 2022-03-12: qty 1
  Filled 2022-03-12 (×4): qty 2
  Filled 2022-03-12 (×2): qty 1
  Filled 2022-03-12 (×2): qty 2
  Filled 2022-03-12 (×2): qty 1

## 2022-03-12 MED ORDER — HEPARIN SODIUM (PORCINE) 1000 UNIT/ML IJ SOLN
INTRAMUSCULAR | Status: DC | PRN
  Administered 2022-03-12: 25000 [IU] via INTRAVENOUS

## 2022-03-12 MED ORDER — ASPIRIN 81 MG PO CHEW
324.0000 mg | CHEWABLE_TABLET | Freq: Every day | ORAL | Status: DC
  Administered 2022-03-13 – 2022-03-23 (×3): 324 mg
  Filled 2022-03-12 (×5): qty 4

## 2022-03-12 MED ORDER — LACTATED RINGERS IV SOLN: INTRAVENOUS | Status: DC | PRN

## 2022-03-12 MED ORDER — MILRINONE LACTATE IN DEXTROSE 20-5 MG/100ML-% IV SOLN
0.1250 ug/kg/min | INTRAVENOUS | Status: DC
  Administered 2022-03-12 – 2022-03-17 (×13): 0.375 ug/kg/min via INTRAVENOUS
  Administered 2022-03-18: 0.25 ug/kg/min via INTRAVENOUS
  Administered 2022-03-18: 0.375 ug/kg/min via INTRAVENOUS
  Administered 2022-03-19: 0.25 ug/kg/min via INTRAVENOUS
  Filled 2022-03-12 (×15): qty 100

## 2022-03-12 MED ORDER — DEXMEDETOMIDINE HCL IN NACL 400 MCG/100ML IV SOLN
0.0000 ug/kg/h | INTRAVENOUS | Status: DC
  Administered 2022-03-12 (×2): 0.7 ug/kg/h via INTRAVENOUS
  Administered 2022-03-13 (×2): 1.2 ug/kg/h via INTRAVENOUS
  Administered 2022-03-14: 0.5 ug/kg/h via INTRAVENOUS
  Administered 2022-03-14: 0.2 ug/kg/h via INTRAVENOUS
  Filled 2022-03-12 (×2): qty 200
  Filled 2022-03-12 (×2): qty 100

## 2022-03-12 MED ORDER — NOREPINEPHRINE 16 MG/250ML-% IV SOLN
0.0000 ug/min | INTRAVENOUS | Status: DC
  Administered 2022-03-12: 6 ug/min via INTRAVENOUS
  Filled 2022-03-12: qty 250

## 2022-03-12 MED ORDER — EPINEPHRINE HCL 5 MG/250ML IV SOLN IN NS: 0.0000 ug/min | INTRAVENOUS | Status: DC

## 2022-03-12 MED ORDER — ACETAMINOPHEN 500 MG PO TABS
1000.0000 mg | ORAL_TABLET | Freq: Four times a day (QID) | ORAL | Status: AC
  Administered 2022-03-14 – 2022-03-17 (×11): 1000 mg via ORAL
  Filled 2022-03-12 (×11): qty 2

## 2022-03-12 MED ORDER — MAGNESIUM SULFATE 4 GM/100ML IV SOLN
4.0000 g | Freq: Once | INTRAVENOUS | Status: AC
  Administered 2022-03-12: 4 g via INTRAVENOUS
  Filled 2022-03-12: qty 100

## 2022-03-12 MED ORDER — CHLORHEXIDINE GLUCONATE 0.12 % MT SOLN
15.0000 mL | OROMUCOSAL | Status: AC
  Administered 2022-03-12: 15 mL via OROMUCOSAL
  Filled 2022-03-12: qty 15

## 2022-03-12 MED ORDER — PHENYLEPHRINE HCL-NACL 20-0.9 MG/250ML-% IV SOLN: 0.0000 ug/min | INTRAVENOUS | Status: DC

## 2022-03-12 MED ORDER — OXYCODONE HCL 5 MG PO TABS
5.0000 mg | ORAL_TABLET | ORAL | Status: DC | PRN
  Administered 2022-03-12 – 2022-03-21 (×12): 10 mg via ORAL
  Filled 2022-03-12 (×13): qty 2

## 2022-03-12 MED ORDER — PHENYLEPHRINE 80 MCG/ML (10ML) SYRINGE FOR IV PUSH (FOR BLOOD PRESSURE SUPPORT)
PREFILLED_SYRINGE | INTRAVENOUS | Status: DC | PRN
  Administered 2022-03-12 (×2): 80 ug via INTRAVENOUS
  Administered 2022-03-12: 40 ug via INTRAVENOUS
  Administered 2022-03-12: 80 ug via INTRAVENOUS

## 2022-03-12 MED ORDER — TRAMADOL HCL 50 MG PO TABS
50.0000 mg | ORAL_TABLET | ORAL | Status: DC | PRN
  Administered 2022-03-13 – 2022-03-20 (×13): 100 mg via ORAL
  Administered 2022-03-21 – 2022-03-25 (×7): 50 mg via ORAL
  Administered 2022-03-27: 100 mg via ORAL
  Filled 2022-03-12: qty 1
  Filled 2022-03-12 (×8): qty 2
  Filled 2022-03-12: qty 1
  Filled 2022-03-12: qty 2
  Filled 2022-03-12: qty 1
  Filled 2022-03-12: qty 2
  Filled 2022-03-12 (×2): qty 1
  Filled 2022-03-12 (×3): qty 2
  Filled 2022-03-12: qty 1
  Filled 2022-03-12 (×2): qty 2
  Filled 2022-03-12: qty 1

## 2022-03-12 MED ORDER — SODIUM CHLORIDE 0.9% FLUSH
3.0000 mL | Freq: Two times a day (BID) | INTRAVENOUS | Status: DC
  Administered 2022-03-13 – 2022-03-27 (×24): 3 mL via INTRAVENOUS

## 2022-03-12 MED ORDER — SODIUM CHLORIDE 0.9 % IV SOLN: INTRAVENOUS | Status: DC

## 2022-03-12 MED ORDER — INSULIN REGULAR(HUMAN) IN NACL 100-0.9 UT/100ML-% IV SOLN
INTRAVENOUS | Status: DC
  Administered 2022-03-13: 8 [IU]/h via INTRAVENOUS
  Administered 2022-03-14: 4.4 [IU]/h via INTRAVENOUS
  Filled 2022-03-12 (×2): qty 100

## 2022-03-12 MED ORDER — SODIUM CHLORIDE 0.9% FLUSH: 3.0000 mL | INTRAVENOUS | Status: DC | PRN

## 2022-03-12 MED ORDER — PROCHLORPERAZINE EDISYLATE 10 MG/2ML IJ SOLN
10.0000 mg | Freq: Four times a day (QID) | INTRAMUSCULAR | Status: DC | PRN
  Administered 2022-03-15 – 2022-03-17 (×3): 10 mg via INTRAVENOUS
  Filled 2022-03-12 (×4): qty 2

## 2022-03-12 MED ORDER — ACETAMINOPHEN 650 MG RE SUPP
650.0000 mg | Freq: Once | RECTAL | Status: AC
  Administered 2022-03-12: 650 mg via RECTAL

## 2022-03-12 MED ORDER — BISACODYL 10 MG RE SUPP: 10.0000 mg | Freq: Every day | RECTAL | Status: DC

## 2022-03-12 MED ORDER — SODIUM CHLORIDE (PF) 0.9 % IJ SOLN
OROMUCOSAL | Status: DC | PRN
  Administered 2022-03-12 (×2): 4 mL via TOPICAL

## 2022-03-12 MED ORDER — DOBUTAMINE IN D5W 4-5 MG/ML-% IV SOLN
2.5000 ug/kg/min | INTRAVENOUS | Status: DC
  Administered 2022-03-14: 2.5 ug/kg/min via INTRAVENOUS
  Filled 2022-03-12: qty 250

## 2022-03-12 MED ORDER — ORAL CARE MOUTH RINSE: 15.0000 mL | OROMUCOSAL | Status: DC | PRN

## 2022-03-12 MED ORDER — FENTANYL CITRATE (PF) 250 MCG/5ML IJ SOLN
INTRAMUSCULAR | Status: AC
  Filled 2022-03-12: qty 5

## 2022-03-12 MED ORDER — ASPIRIN 325 MG PO TBEC
325.0000 mg | DELAYED_RELEASE_TABLET | Freq: Every day | ORAL | Status: DC
  Administered 2022-03-15 – 2022-03-22 (×8): 325 mg via ORAL
  Filled 2022-03-12 (×8): qty 1

## 2022-03-12 MED ORDER — CALCIUM CHLORIDE 10 % IV SOLN
INTRAVENOUS | Status: DC | PRN
  Administered 2022-03-12: 200 mg via INTRAVENOUS
  Administered 2022-03-12: 100 mg via INTRAVENOUS
  Administered 2022-03-12: 300 mg via INTRAVENOUS
  Administered 2022-03-12 (×2): 200 mg via INTRAVENOUS

## 2022-03-12 MED ORDER — METOPROLOL TARTRATE 12.5 MG HALF TABLET: 12.5000 mg | ORAL_TABLET | Freq: Two times a day (BID) | ORAL | Status: DC

## 2022-03-12 MED ORDER — INSULIN REGULAR(HUMAN) IN NACL 100-0.9 UT/100ML-% IV SOLN
INTRAVENOUS | Status: DC | PRN
  Administered 2022-03-12: 1 [IU]/h via INTRAVENOUS

## 2022-03-12 MED ORDER — LACTATED RINGERS IV SOLN: INTRAVENOUS | Status: DC

## 2022-03-12 MED ORDER — ETOMIDATE 2 MG/ML IV SOLN
INTRAVENOUS | Status: DC | PRN
  Administered 2022-03-12: 20 mg via INTRAVENOUS

## 2022-03-12 MED ORDER — ALBUMIN HUMAN 5 % IV SOLN
250.0000 mL | INTRAVENOUS | Status: AC | PRN
  Administered 2022-03-12 (×2): 12.5 g via INTRAVENOUS

## 2022-03-12 MED ORDER — DEXTROSE 50 % IV SOLN: 0.0000 mL | INTRAVENOUS | Status: DC | PRN

## 2022-03-12 MED ORDER — NITROPRUSSIDE SODIUM-NACL 20-0.9 MG/100ML-% IV SOLN
0.0000 ug/kg/min | INTRAVENOUS | Status: DC
  Filled 2022-03-12: qty 100

## 2022-03-12 MED ORDER — VASOPRESSIN 20 UNITS/100 ML INFUSION FOR SHOCK
0.0000 [IU]/min | INTRAVENOUS | Status: DC
  Administered 2022-03-12: .03 [IU]/min via INTRAVENOUS
  Filled 2022-03-12: qty 100

## 2022-03-12 MED ORDER — VASOPRESSIN 20 UNITS/100 ML INFUSION FOR SHOCK
0.0000 [IU]/min | INTRAVENOUS | Status: DC
  Administered 2022-03-12: 0.03 [IU]/min via INTRAVENOUS
  Filled 2022-03-12: qty 100

## 2022-03-12 MED ORDER — CEFAZOLIN SODIUM-DEXTROSE 2-3 GM-%(50ML) IV SOLR
INTRAVENOUS | Status: DC | PRN
  Administered 2022-03-12 (×2): 2 g via INTRAVENOUS

## 2022-03-12 MED ORDER — DOBUTAMINE INFUSION FOR EP/ECHO/NUC (1000 MCG/ML)
INTRAVENOUS | Status: DC | PRN
  Administered 2022-03-12: 2.5 ug/kg/min via INTRAVENOUS

## 2022-03-12 MED ORDER — 0.9 % SODIUM CHLORIDE (POUR BTL) OPTIME
TOPICAL | Status: DC | PRN
  Administered 2022-03-12: 5000 mL

## 2022-03-12 MED ORDER — METOPROLOL TARTRATE 25 MG/10 ML ORAL SUSPENSION: 12.5000 mg | Freq: Two times a day (BID) | ORAL | Status: DC

## 2022-03-12 MED ORDER — EPHEDRINE SULFATE-NACL 50-0.9 MG/10ML-% IV SOSY
PREFILLED_SYRINGE | INTRAVENOUS | Status: DC | PRN
  Administered 2022-03-12: 5 mg via INTRAVENOUS
  Administered 2022-03-12: 10 mg via INTRAVENOUS
  Administered 2022-03-12 (×3): 5 mg via INTRAVENOUS

## 2022-03-12 MED ORDER — CHLORHEXIDINE GLUCONATE CLOTH 2 % EX PADS
6.0000 | MEDICATED_PAD | Freq: Every day | CUTANEOUS | Status: DC
  Administered 2022-03-12 – 2022-03-27 (×17): 6 via TOPICAL

## 2022-03-12 MED ORDER — ORAL CARE MOUTH RINSE
15.0000 mL | OROMUCOSAL | Status: DC
  Administered 2022-03-13 – 2022-03-14 (×12): 15 mL via OROMUCOSAL

## 2022-03-12 MED ORDER — SODIUM CHLORIDE 0.45 % IV SOLN: INTRAVENOUS | Status: DC | PRN

## 2022-03-12 MED ORDER — LIDOCAINE 2% (20 MG/ML) 5 ML SYRINGE
INTRAMUSCULAR | Status: AC
  Filled 2022-03-12: qty 5

## 2022-03-12 MED ORDER — LIDOCAINE 2% (20 MG/ML) 5 ML SYRINGE
INTRAMUSCULAR | Status: DC | PRN
  Administered 2022-03-12: 60 mg via INTRAVENOUS

## 2022-03-12 MED ORDER — MIDAZOLAM HCL 2 MG/2ML IJ SOLN
2.0000 mg | INTRAMUSCULAR | Status: DC | PRN
  Administered 2022-03-12 – 2022-03-13 (×5): 2 mg via INTRAVENOUS
  Filled 2022-03-12 (×5): qty 2

## 2022-03-12 MED ORDER — VANCOMYCIN HCL IN DEXTROSE 1-5 GM/200ML-% IV SOLN
1000.0000 mg | Freq: Once | INTRAVENOUS | Status: AC
  Administered 2022-03-12: 1000 mg via INTRAVENOUS
  Filled 2022-03-12: qty 200

## 2022-03-12 MED ORDER — HEPARIN SODIUM (PORCINE) 1000 UNIT/ML IJ SOLN
INTRAMUSCULAR | Status: AC
  Filled 2022-03-12: qty 1

## 2022-03-12 MED ORDER — ACETAMINOPHEN 160 MG/5ML PO SOLN: 650.0000 mg | Freq: Once | ORAL | Status: AC

## 2022-03-12 MED ORDER — DOCUSATE SODIUM 100 MG PO CAPS: 200.0000 mg | ORAL_CAPSULE | Freq: Every day | ORAL | Status: DC

## 2022-03-12 MED ORDER — METOPROLOL TARTRATE 5 MG/5ML IV SOLN: 2.5000 mg | INTRAVENOUS | Status: DC | PRN

## 2022-03-12 MED ORDER — FENTANYL CITRATE (PF) 250 MCG/5ML IJ SOLN
INTRAMUSCULAR | Status: DC | PRN
  Administered 2022-03-12 (×2): 150 ug via INTRAVENOUS
  Administered 2022-03-12 (×2): 100 ug via INTRAVENOUS
  Administered 2022-03-12: 50 ug via INTRAVENOUS
  Administered 2022-03-12 (×2): 100 ug via INTRAVENOUS

## 2022-03-12 MED ORDER — DEXAMETHASONE SODIUM PHOSPHATE 10 MG/ML IJ SOLN
INTRAMUSCULAR | Status: DC | PRN
  Administered 2022-03-12: 10 mg via INTRAVENOUS

## 2022-03-12 MED ORDER — ACETAMINOPHEN 160 MG/5ML PO SOLN
1000.0000 mg | Freq: Four times a day (QID) | ORAL | Status: AC
  Administered 2022-03-13 – 2022-03-17 (×8): 1000 mg
  Filled 2022-03-12 (×8): qty 40.6

## 2022-03-12 MED ORDER — ALBUMIN HUMAN 5 % IV SOLN: INTRAVENOUS | Status: DC | PRN

## 2022-03-12 MED ORDER — PLASMA-LYTE A IV SOLN: INTRAVENOUS | Status: DC | PRN

## 2022-03-12 MED ORDER — ROCURONIUM BROMIDE 10 MG/ML (PF) SYRINGE
PREFILLED_SYRINGE | INTRAVENOUS | Status: DC | PRN
  Administered 2022-03-12 (×3): 50 mg via INTRAVENOUS
  Administered 2022-03-12: 100 mg via INTRAVENOUS
  Administered 2022-03-12 (×2): 50 mg via INTRAVENOUS

## 2022-03-12 MED ORDER — PANTOPRAZOLE SODIUM 40 MG PO TBEC: 40.0000 mg | DELAYED_RELEASE_TABLET | Freq: Every day | ORAL | Status: DC

## 2022-03-12 MED ORDER — LACTATED RINGERS IV SOLN: 500.0000 mL | Freq: Once | INTRAVENOUS | Status: DC | PRN

## 2022-03-12 MED ORDER — NOREPINEPHRINE 4 MG/250ML-% IV SOLN
0.0000 ug/min | INTRAVENOUS | Status: DC
  Filled 2022-03-12: qty 250

## 2022-03-12 MED ORDER — DOBUTAMINE IN D5W 4-5 MG/ML-% IV SOLN
2.5000 ug/kg/min | INTRAVENOUS | Status: DC
  Filled 2022-03-12 (×2): qty 250

## 2022-03-12 MED ORDER — PROTAMINE SULFATE 10 MG/ML IV SOLN
INTRAVENOUS | Status: DC | PRN
  Administered 2022-03-12: 50 mg via INTRAVENOUS
  Administered 2022-03-12: 150 mg via INTRAVENOUS
  Administered 2022-03-12: 50 mg via INTRAVENOUS

## 2022-03-12 MED ORDER — FAMOTIDINE IN NACL 20-0.9 MG/50ML-% IV SOLN
20.0000 mg | Freq: Two times a day (BID) | INTRAVENOUS | Status: AC
  Administered 2022-03-12 – 2022-03-13 (×2): 20 mg via INTRAVENOUS
  Filled 2022-03-12 (×2): qty 50

## 2022-03-12 MED ORDER — NITROGLYCERIN IN D5W 200-5 MCG/ML-% IV SOLN: 0.0000 ug/min | INTRAVENOUS | Status: DC

## 2022-03-12 MED ORDER — POTASSIUM CHLORIDE 10 MEQ/50ML IV SOLN: 10.0000 meq | INTRAVENOUS | Status: AC

## 2022-03-12 MED ORDER — SODIUM CHLORIDE 0.9 % IV SOLN: 250.0000 mL | INTRAVENOUS | Status: DC

## 2022-03-12 MED ORDER — MIDAZOLAM HCL 2 MG/2ML IJ SOLN
INTRAMUSCULAR | Status: DC | PRN
  Administered 2022-03-12 (×4): 2 mg via INTRAVENOUS

## 2022-03-12 SURGICAL SUPPLY — 94 items
ADH SKN CLS APL DERMABOND .7 (GAUZE/BANDAGES/DRESSINGS) ×6
BAG DECANTER FOR FLEXI CONT (MISCELLANEOUS) ×3 IMPLANT
BLADE CLIPPER SURG (BLADE) ×3 IMPLANT
BLADE STERNUM SYSTEM 6 (BLADE) ×3 IMPLANT
BNDG ELASTIC 4X5.8 VLCR STR LF (GAUZE/BANDAGES/DRESSINGS) ×3 IMPLANT
BNDG ELASTIC 6X5.8 VLCR STR LF (GAUZE/BANDAGES/DRESSINGS) ×3 IMPLANT
BNDG GAUZE DERMACEA FLUFF 4 (GAUZE/BANDAGES/DRESSINGS) ×3 IMPLANT
BNDG GZE DERMACEA 4 6PLY (GAUZE/BANDAGES/DRESSINGS) ×2
CABLE SURGICAL S-101-97-12 (CABLE) ×3 IMPLANT
CANISTER SUCT 3000ML PPV (MISCELLANEOUS) ×3 IMPLANT
CANNULA AORTIC ROOT 9FR (CANNULA) IMPLANT
CANNULA MC2 2 STG 29/37 NON-V (CANNULA) ×3 IMPLANT
CANNULA MC2 TWO STAGE (CANNULA) ×2
CANNULA NON VENT 20FR 12 (CANNULA) ×3 IMPLANT
CATH ROBINSON RED A/P 18FR (CATHETERS) ×6 IMPLANT
CLIP RETRACTION 3.0MM CORONARY (MISCELLANEOUS) ×3 IMPLANT
CLIP VESOCCLUDE MED 24/CT (CLIP) IMPLANT
CLIP VESOCCLUDE SM WIDE 24/CT (CLIP) IMPLANT
CONN ST 1/2X1/2  BEN (MISCELLANEOUS) ×2
CONN ST 1/2X1/2 BEN (MISCELLANEOUS) ×3 IMPLANT
CONNECTOR BLAKE 2:1 CARIO BLK (MISCELLANEOUS) ×3 IMPLANT
CONTAINER PROTECT SURGISLUSH (MISCELLANEOUS) ×6 IMPLANT
DERMABOND ADVANCED .7 DNX12 (GAUZE/BANDAGES/DRESSINGS) IMPLANT
DRAIN CHANNEL 19F RND (DRAIN) ×9 IMPLANT
DRAIN CONNECTOR BLAKE 1:1 (MISCELLANEOUS) ×3 IMPLANT
DRAPE CARDIOVASCULAR INCISE (DRAPES) ×2
DRAPE INCISE IOBAN 66X45 STRL (DRAPES) IMPLANT
DRAPE SRG 135X102X78XABS (DRAPES) ×3 IMPLANT
DRAPE WARM FLUID 44X44 (DRAPES) ×3 IMPLANT
DRSG AQUACEL AG ADV 3.5X10 (GAUZE/BANDAGES/DRESSINGS) ×3 IMPLANT
DRSG COVADERM 4X14 (GAUZE/BANDAGES/DRESSINGS) ×3 IMPLANT
ELECT BLADE 4.0 EZ CLEAN MEGAD (MISCELLANEOUS) ×2
ELECT REM PT RETURN 9FT ADLT (ELECTROSURGICAL) ×4
ELECTRODE BLDE 4.0 EZ CLN MEGD (MISCELLANEOUS) ×3 IMPLANT
ELECTRODE REM PT RTRN 9FT ADLT (ELECTROSURGICAL) ×6 IMPLANT
FELT TEFLON 1X6 (MISCELLANEOUS) ×6 IMPLANT
GAUZE 4X4 16PLY ~~LOC~~+RFID DBL (SPONGE) ×3 IMPLANT
GAUZE SPONGE 4X4 12PLY STRL (GAUZE/BANDAGES/DRESSINGS) ×6 IMPLANT
GLOVE BIO SURGEON STRL SZ 6 (GLOVE) IMPLANT
GLOVE BIO SURGEON STRL SZ 6.5 (GLOVE) IMPLANT
GLOVE BIO SURGEON STRL SZ7 (GLOVE) ×6 IMPLANT
GLOVE BIO SURGEON STRL SZ7.5 (GLOVE) IMPLANT
GLOVE BIOGEL M STRL SZ7.5 (GLOVE) ×6 IMPLANT
GLOVE BIOGEL PI IND STRL 6.5 (GLOVE) IMPLANT
GLOVE SS BIOGEL STRL SZ 7.5 (GLOVE) IMPLANT
GOWN STRL REUS W/ TWL LRG LVL3 (GOWN DISPOSABLE) ×12 IMPLANT
GOWN STRL REUS W/ TWL XL LVL3 (GOWN DISPOSABLE) ×6 IMPLANT
GOWN STRL REUS W/TWL LRG LVL3 (GOWN DISPOSABLE) ×16
GOWN STRL REUS W/TWL XL LVL3 (GOWN DISPOSABLE) ×4
HEMOSTAT POWDER SURGIFOAM 1G (HEMOSTASIS) ×9 IMPLANT
INSERT SUTURE HOLDER (MISCELLANEOUS) ×3 IMPLANT
KIT BASIN OR (CUSTOM PROCEDURE TRAY) ×3 IMPLANT
KIT SUCTION CATH 14FR (SUCTIONS) ×3 IMPLANT
KIT TURNOVER KIT B (KITS) ×3 IMPLANT
KIT VASOVIEW HEMOPRO 2 VH 4000 (KITS) ×3 IMPLANT
LEAD PACING MYOCARDI (MISCELLANEOUS) ×3 IMPLANT
MARKER GRAFT CORONARY BYPASS (MISCELLANEOUS) ×9 IMPLANT
NS IRRIG 1000ML POUR BTL (IV SOLUTION) ×15 IMPLANT
PACK ACCESSORY CANNULA KIT (KITS) ×3 IMPLANT
PACK E OPEN HEART (SUTURE) ×3 IMPLANT
PACK OPEN HEART (CUSTOM PROCEDURE TRAY) ×3 IMPLANT
PAD ARMBOARD 7.5X6 YLW CONV (MISCELLANEOUS) ×6 IMPLANT
PAD ELECT DEFIB RADIOL ZOLL (MISCELLANEOUS) ×3 IMPLANT
PENCIL BUTTON HOLSTER BLD 10FT (ELECTRODE) ×3 IMPLANT
POSITIONER HEAD DONUT 9IN (MISCELLANEOUS) ×3 IMPLANT
PUNCH AORTIC ROTATE 4.0MM (MISCELLANEOUS) ×3 IMPLANT
SET CARDIO DLP MULTI-PER 4-LEG (CARDIAC RHYTHM DISPOSABLE) IMPLANT
SET MPS 3-ND DEL (MISCELLANEOUS) IMPLANT
SPONGE T-LAP 18X18 ~~LOC~~+RFID (SPONGE) ×12 IMPLANT
SUPPORT HEART JANKE-BARRON (MISCELLANEOUS) ×3 IMPLANT
SUT BONE WAX W31G (SUTURE) ×3 IMPLANT
SUT ETHIBOND X763 2 0 SH 1 (SUTURE) ×6 IMPLANT
SUT MNCRL AB 3-0 PS2 18 (SUTURE) ×6 IMPLANT
SUT MNCRL AB 4-0 PS2 18 (SUTURE) IMPLANT
SUT PDS AB 1 CTX 36 (SUTURE) ×6 IMPLANT
SUT PROLENE 4 0 SH DA (SUTURE) ×3 IMPLANT
SUT PROLENE 5 0 C 1 36 (SUTURE) ×9 IMPLANT
SUT PROLENE 7 0 BV1 MDA (SUTURE) ×3 IMPLANT
SUT STEEL 6MS V (SUTURE) ×6 IMPLANT
SUT STEEL STERNAL CCS#1 18IN (SUTURE) IMPLANT
SUT TEM PAC WIRE 2 0 SH (SUTURE) IMPLANT
SUT VIC AB 2-0 CT1 27 (SUTURE) ×2
SUT VIC AB 2-0 CT1 36 (SUTURE) IMPLANT
SUT VIC AB 2-0 CT1 TAPERPNT 27 (SUTURE) IMPLANT
SYSTEM SAHARA CHEST DRAIN ATS (WOUND CARE) ×3 IMPLANT
TAPE CLOTH SURG 4X10 WHT LF (GAUZE/BANDAGES/DRESSINGS) IMPLANT
TOWEL GREEN STERILE (TOWEL DISPOSABLE) ×3 IMPLANT
TOWEL GREEN STERILE FF (TOWEL DISPOSABLE) ×3 IMPLANT
TRAY FOLEY SLVR 16FR TEMP STAT (SET/KITS/TRAYS/PACK) ×3 IMPLANT
TUBE SUCT INTRACARD DLP 20F (MISCELLANEOUS) IMPLANT
TUBE SUCTION CARDIAC 10FR (CANNULA) IMPLANT
TUBING LAP HI FLOW INSUFFLATIO (TUBING) ×3 IMPLANT
UNDERPAD 30X36 HEAVY ABSORB (UNDERPADS AND DIAPERS) ×3 IMPLANT
WATER STERILE IRR 1000ML POUR (IV SOLUTION) ×6 IMPLANT

## 2022-03-12 NOTE — Brief Op Note (Signed)
03/10/2022 - 03/12/2022  2:08 PM  PATIENT:  Juan Snow  67 y.o. male  PRE-OPERATIVE DIAGNOSIS:  CORONARY ARTERY DISEASE  POST-OPERATIVE DIAGNOSIS:  CORONARY ARTERY DISEASE  PROCEDURE:  CORONARY ARTERY BYPASS GRAFTING (CABG) X 2 BYPASSES USING LEFT INTERNAL MAMMARY ARTERY AND RIGHT GREATER SAPHENOUS  VEIN HARVEST.  TRANSESOPHAGEAL ECHOCARDIOGRAM (TEE)  -SVG to Ramus -LIMA to LAD Vein harvest time: 51mn Vein prep time: 189m  SURGEON:  Surgeon(s) and Role: [place], HaLucile CraterMD - Primary  PHYSICIAN ASSISTANT: BaWynelle BeckmannA-C, WaJadene PieriniA-C, GaCato MulliganA-S  ASSISTANTS: KrFarrel GordonNFA   ANESTHESIA:   general  EBL:  264 mL   BLOOD ADMINISTERED:none  DRAINS:  Mediastinal and pleural chest tubes    LOCAL MEDICATIONS USED:  NONE  SPECIMEN:  No Specimen  DISPOSITION OF SPECIMEN:  N/A  COUNTS CORRECT:  YES  DICTATION: .Dragon Dictation  PLAN OF CARE: Admit to inpatient   PATIENT DISPOSITION:  ICU - intubated and hemodynamically stable.   Delay start of Pharmacological VTE agent (>24hrs) due to surgical blood loss or risk of bleeding: yes

## 2022-03-12 NOTE — Transfer of Care (Signed)
Immediate Anesthesia Transfer of Care Note  Patient: Juan Snow  Procedure(s) Performed: CORONARY ARTERY BYPASS GRAFTING (CABG) X BYPASSES USING LEFT INTERNAL MAMMARY ARTERY AND RIGHT GREATER SAPHENOUS  VEIN HARVEST. (Chest) TRANSESOPHAGEAL ECHOCARDIOGRAM (TEE) (Esophagus)  Patient Location: SICU  Anesthesia Type:General  Level of Consciousness: Patient remains intubated per anesthesia plan  Airway & Oxygen Therapy: Patient remains intubated per anesthesia plan and Patient placed on Ventilator (see vital sign flow sheet for setting)  Post-op Assessment: Post -op Vital signs reviewed and stable  Post vital signs: stable  Last Vitals:  Vitals Value Taken Time  BP    Temp    Pulse 143 03/12/22 1359  Resp 17 03/12/22 1359  SpO2 96 % 03/12/22 1359  Vitals shown include unvalidated device data.  Last Pain:  Vitals:   03/12/22 0400  TempSrc:   PainSc: 0-No pain      Patients Stated Pain Goal: 0 (93/81/01 7510)  Complications: No notable events documented.

## 2022-03-12 NOTE — Consult Note (Signed)
NAME:  Juan Snow, MRN:  [id number], DOB:  11-Oct-1955, LOS: 2 ADMISSION DATE:  03/10/2022, CONSULTATION DATE:  03/12/22 REFERRING MD:  Kipp Brood, CHIEF COMPLAINT:  CABG   History of Present Illness:  67 year old man presented with chest pain on 03/10/22 found to have STEMI and severe 2V CAD, IABP placed with improvement in anginal symptoms and he was seen by cardiothoracic surgery.  Underwent CABG x 2 today (LIMA-LAD, SVG-ramus) with intra-op course complicated by difficulty weaning off bypass due to high PA pressures and low LV function; with iNO and high dose inotropes+pressors, able to be eventually weaned off pump.  Brought to ICU on pressors, inotropes, iNO, IABP, vent.  PCCM consulted to assist with management.   Pertinent  Medical History  GERD Prior VTE on AC PTA Arthtritis  Significant Hospital Events: Including procedures, antibiotic start and stop dates in addition to other pertinent events   1/7 admit 1/9 CABG x 2, saphenous graft harvest  Interim History / Subjective:  Consult  Objective   Blood pressure (!) 144/75, pulse 91, temperature 98.2 F (36.8 C), temperature source Oral, resp. rate (!) 24, height '5\' 7"'$  (1.702 m), weight 87.4 kg, SpO2 92 %.        Intake/Output Summary (Last 24 hours) at 03/12/2022 1358 Last data filed at 03/12/2022 1308 Gross per 24 hour  Intake 4265.92 ml  Output 1909 ml  Net 2356.92 ml   Filed Weights   03/10/22 1400 03/11/22 0303 03/12/22 0600  Weight: 85 kg 93.2 kg 87.4 kg    Examination: General: intubated, sedated HENT: pupils pinpoint, reactive Lungs: clear, no wheezing, passive on vent Cardiovascular: sternotomy site dressed without strikethrough Abdomen: soft, hypoactive BS Extremities: RLE wrapped, ext cool to touch, RN to doppler, R groin IABP insertion site looks okay Neuro: currently sedated/paralyzed GU: foley small yellow urine Mediastinal drains small sanguinous output  ABG acidemic, vent adjustments made CBG  ok CXR scattered atelectasis otherwise looks okay  Resolved Hospital Problem list   N/A  Assessment & Plan:  Postoperative ventilator management Post cardiotomy shock state STEMI s/p 2V CABG Likely baseline CKD GERD Hx PE on AC PTA  - Drains, inotropes, iNO, IABP wean per TCTS/CHF teams - Pressors for MAP 70-80 - Lung protective tidal volumes limiting driving pressures to < 15cm H2O as able - Sedation titrated to vent compliance - VAP prevention bundle - Daily SAT/SBT when meets institutional criteria: let rest tonight due to iNO and degree of hemodynamic support - Resume AC when deemed safe  Best Practice (right click and "Reselect all SmartList Selections" daily)   Diet/type: NPO DVT prophylaxis: not indicated GI prophylaxis: PPI Lines: Central line Foley:  Yes, and it is still needed Code Status:  full code Last date of multidisciplinary goals of care discussion [per primary]  Labs   CBC: Recent Labs  Lab 03/10/22 1155 03/11/22 0611 03/12/22 0600 03/12/22 0852 03/12/22 1054 03/12/22 1101 03/12/22 1123 03/12/22 1248 03/12/22 1252  WBC 7.6 12.1* 12.2*  --   --   --   --   --   --   NEUTROABS 4.9  --   --   --   --   --   --   --   --   HGB 17.5* 16.5 17.5*   < > 10.2* 11.1* 10.5* 11.2* 11.6*  HCT 52.3* 48.3 51.7   < > 30.0* 33.2* 31.0* 33.0* 34.0*  MCV 91.0 88.8 89.0  --   --   --   --   --   --  PLT 189 144* 111*  --   --  92*  --   --   --    < > = values in this interval not displayed.    Basic Metabolic Panel: Recent Labs  Lab 03/10/22 1155 03/11/22 0611 03/12/22 0600 03/12/22 0852 03/12/22 0855 03/12/22 1007 03/12/22 1025 03/12/22 1031 03/12/22 1054 03/12/22 1123 03/12/22 1248 03/12/22 1252  NA 139 133* 134* 135   < > 136   < > 141 135 135 137 137  K 3.1* 4.0 4.0 4.0   < > 4.4   < > 4.0 5.2* 5.5* 4.2 4.3  CL 105 102 101 101  --  103  --   --  102  --  103  --   CO2 18* 20* 21*  --   --   --   --   --   --   --   --   --   GLUCOSE 130*  150* 132* 144*  --  133*  --   --  131*  --  156*  --   BUN '20 15 15 16  '$ --  16  --   --  15  --  15  --   CREATININE 1.88* 1.22 1.49* 1.30*  --  1.20  --   --  1.30*  --  1.30*  --   CALCIUM 9.1 8.4* 8.7*  --   --   --   --   --   --   --   --   --   MG  --  1.9 1.9  --   --   --   --   --   --   --   --   --    < > = values in this interval not displayed.   GFR: Estimated Creatinine Clearance: 59.8 mL/min (A) (by C-G formula based on SCr of 1.3 mg/dL (H)). Recent Labs  Lab 03/10/22 1155 03/11/22 0611 03/12/22 0600  WBC 7.6 12.1* 12.2*    Liver Function Tests: Recent Labs  Lab 03/10/22 1155  AST 26  ALT 24  ALKPHOS 48  BILITOT 0.5  PROT 7.0  ALBUMIN 3.7   No results for input(s): "LIPASE", "AMYLASE" in the last 168 hours. No results for input(s): "AMMONIA" in the last 168 hours.  ABG    Component Value Date/Time   PHART 7.268 (L) 03/12/2022 1252   PCO2ART 46.4 03/12/2022 1252   PO2ART 107 03/12/2022 1252   HCO3 21.2 03/12/2022 1252   TCO2 23 03/12/2022 1252   ACIDBASEDEF 6.0 (H) 03/12/2022 1252   O2SAT 97 03/12/2022 1252     Coagulation Profile: Recent Labs  Lab 03/10/22 1155  INR 1.4*    Cardiac Enzymes: No results for input(s): "CKTOTAL", "CKMB", "CKMBINDEX", "TROPONINI" in the last 168 hours.  HbA1C: Hgb A1c MFr Bld  Date/Time Value Ref Range Status  03/10/2022 11:55 AM 6.2 (H) 4.8 - 5.6 % Final    Comment:    (NOTE)         Prediabetes: 5.7 - 6.4         Diabetes: >6.4         Glycemic control for adults with diabetes: <7.0     CBG: No results for input(s): "GLUCAP" in the last 168 hours.  Review of Systems:   Intubated/sedated  Past Medical History:  He,  has a past medical history of Arthritis, Cough, and GERD (gastroesophageal reflux disease).   Surgical History:   Past  Surgical History:  Procedure Laterality Date   CHOLECYSTECTOMY N/A 03/30/2021   Procedure: LAPAROSCOPIC CHOLECYSTECTOMY WITH INTRAOPERATIVE CHOLANGIOGRAM;  Surgeon:  Armandina Gemma, MD;  Location: [facility];  Service: General;  Laterality: N/A;   COLONOSCOPY  06/17/2011   Procedure: COLONOSCOPY;  Surgeon: [name], MD;  Location: [facility];  Service: Endoscopy;  Laterality: N/A;   CORONARY/GRAFT ACUTE MI REVASCULARIZATION N/A 03/10/2022   Procedure: Coronary/Graft Acute MI Revascularization;  Surgeon: Early Osmond, MD;  Location: [place] [facility];  Service: Cardiovascular;  Laterality: N/A;   IABP INSERTION N/A 03/10/2022   Procedure: IABP Insertion;  Surgeon: Early Osmond, MD;  Location: [place] [facility];  Service: Cardiovascular;  Laterality: N/A;   IR ANGIOGRAM PULMONARY BILATERAL SELECTIVE  05/11/2021   IR ANGIOGRAM SELECTIVE EACH ADDITIONAL VESSEL  05/11/2021   IR ANGIOGRAM SELECTIVE EACH ADDITIONAL VESSEL  05/11/2021   IR RADIOLOGIST EVAL & MGMT  06/06/2021   IR RADIOLOGIST EVAL & MGMT  09/06/2021   IR RADIOLOGIST EVAL & MGMT  12/26/2021   IR THROMBECT VENO MECH MOD SED  05/11/2021   IR US GUIDE VASC ACCESS RIGHT  05/11/2021   KNEE SURGERY Right YRS AGO   MENISCUS   LEFT HEART CATH AND CORONARY ANGIOGRAPHY N/A 03/10/2022   Procedure: LEFT HEART CATH AND CORONARY ANGIOGRAPHY;  Surgeon: Early Osmond, MD;  Location: [place] [facility];  Service: Cardiovascular;  Laterality: N/A;   LEFT HEART CATH AND CORONARY ANGIOGRAPHY N/A 03/10/2022   Procedure: LEFT HEART CATH AND CORONARY ANGIOGRAPHY;  Surgeon: Early Osmond, MD;  Location: [place] [facility];  Service: Cardiovascular;  Laterality: N/A;   THUMB ARTHROSCOPY Right YRS AGO   TOTAL HIP ARTHROPLASTY Left 06/25/2017   Procedure: LEFT TOTAL HIP ARTHROPLASTY ANTERIOR APPROACH;  Surgeon: Gaynelle Arabian, MD;  Location: [facility];  Service: Orthopedics;  Laterality: Left;     Social History:   reports that he has never smoked. He has never used smokeless tobacco. He reports that he does not drink alcohol and does not use drugs.   Family History:  His family history includes CAD in his brother;  Hypertension in an other family member. There is no history of Diabetes.   Allergies No Known Allergies   Home Medications  Prior to Admission medications   Medication Sig Start Date End Date Taking? Authorizing Provider  Ascorbic Acid (VITAMIN C PO) Take 1 tablet by mouth daily.   Yes [provider]  Cholecalciferol (VITAMIN D-3 PO) Take 1 tablet by mouth daily.   Yes [provider]  Coenzyme Q10 (CO Q-10 PO) Take 1 capsule by mouth daily.   Yes [provider]  famotidine-calcium carbonate-magnesium hydroxide (PEPCID COMPLETE) 10-800-165 MG chewable tablet Chew 1-2 tablets by mouth 2 (two) times daily as needed (acid reflux).   Yes [provider]  Menaquinone-7 (VITAMIN K2 PO) Take 1 tablet by mouth daily.   Yes [provider]  Omega-3 Fatty Acids (FISH OIL) 1000 MG CAPS Take 1 capsule (1,000 mg total) by mouth daily. 05/13/21  Yes Icard, Bradley L, DO  OVER THE COUNTER MEDICATION Take 15 mLs by mouth daily. Black Seed Oil.   Yes [provider]  RIVAROXABAN Alveda Reasons) VTE STARTER PACK (15 & 20 MG) Follow package directions: Take one '15mg'$  tablet by mouth twice a day. On day 22, switch to one '20mg'$  tablet once a day. Take with food. 12/06/21  Yes Soto, Beverley Fiedler, PA-C  apixaban (ELIQUIS) 5 MG TABS tablet Take 2 tablets (  10 mg total) by mouth 2 (two) times daily. '10mg'$  two times day for 6  more days, Then decrease to '5mg'$  two times daily Patient not taking: Reported on 12/06/2021 05/13/21   [name], NP     Critical care time: 31 minutes

## 2022-03-12 NOTE — Anesthesia Postprocedure Evaluation (Signed)
Anesthesia Post Note  Patient: FRANK NOVELO  Procedure(s) Performed: CORONARY ARTERY BYPASS GRAFTING (CABG) X TWO BYPASSES USING LEFT INTERNAL MAMMARY ARTERY AND RIGHT GREATER SAPHENOUS  VEIN HARVEST. (Chest) TRANSESOPHAGEAL ECHOCARDIOGRAM (TEE) (Esophagus)     Patient location during evaluation: SICU Anesthesia Type: General Level of consciousness: sedated Pain management: pain level controlled Vital Signs Assessment: post-procedure vital signs reviewed and stable Respiratory status: patient remains intubated per anesthesia plan Cardiovascular status: stable Postop Assessment: no apparent nausea or vomiting Anesthetic complications: no  No notable events documented.  Last Vitals:  Vitals:   03/12/22 0700 03/12/22 1400  BP: (!) 144/75   Pulse: 91 81  Resp:  15  Temp:    SpO2: 92% 95%    Last Pain:  Vitals:   03/12/22 0400  TempSrc:   PainSc: 0-No pain                 Ardyce Heyer DANIEL

## 2022-03-12 NOTE — Hospital Course (Addendum)
History of Present Illness:    We are asked to see this 67 year old male in CT surgical consultation for consideration of coronary artery surgical revascularization.  He has a significant history of pulmonary embolism in March 2023 treated with catheter directed thrombolysis.  He is on Xarelto and will require a washout period prior to surgical intervention.  He presented to the ER yesterday with an acute onset of chest pain.  The pain is described as 10/10.  He was found on initial EKG to have peaked T waves but did not meet criteria for for ST elevation myocardial infarction.  A subsequent EKG did demonstrate an acute anterolateral ST elevation myocardial infarction.  He was loaded with heparin, aspirin and received amiodarone for nonsustained ventricular tachycardia.  On arrival to the emergency department pain was approximately 6/10.  He was referred for emergency coronary angiography.  He was found to have an ostial LAD to proximal LAD lesion in the 80% stenosis range as well as a 80% ramus intermedius lesion.  An intra-aortic balloon pump was placed and his anginal symptoms resolved.  An echocardiogram has been performed and results are pending.  Other significant past medical history includes osteoarthritis, hyperlipidemia and GERD.  His most recent troponin I was measured at greater than 24,000 after initial elevation of 153.  He has a minor leukocytosis with a WBC of 12.1 and minor thrombocytopenia with a platelet count of 144 K.  Renal function is noted to be within normal limits.  He has no history of diabetes or thyroid disease.  Dr. Kipp Brood reviewed the patient's studies and determined surgical intervention would provide the patient the best long term treatment. Dr. Kipp Brood reviewed the patient's treatment options as well as the risks and benefits of surgery. Mr. Vo was agreeable to proceed with surgery.   Hospital Course: Mr. Clerk was admitted to [facility] and remained stable  until he was brought to the operating room on 03/12/22. He underwent CABG x 2 utilizing LIMA to LAD and SVG to Ramus. He tolerated the procedure and was transferred to the SICU in stable condition with IABP in place. On POD1 he continued to require inotropic support. The swan ganz catheter was replaced as it was not working properly. Inhaled nitric oxide and milrinone were weaned as tolerated. He remained intubated and on PRN sedated while inhaled nitric oxide was weaned. He was extubated on 42/68/34 without complication. Milrinone was increased to improve his hemodynamics. He has a history of submassive PE and was started on low dose Heparin gtt per heart failure team. Heparin was held and IABP was removed without complication. Chest tubes were removed on POD4. Heprarin gtt was restarted, oral anticoagulation will be required in the future. He began having PACs and SVT runs, Amiodarone gtt and Digoxin was started. He was hemodynamically stable on milrinone 0.375 and dobutamine 4, his swan ganz catheter was removed without complication. CXR showed left pleural effusion vs atelectasis, ultrasound showed very small pleural effusion and atelectasis. Critical care team proceeded with left sided thoracentesis which yielded 200 cc's of dark yellow fluid. He remained volume overload and aggressive diuresis with Lasix gtt and Metolazone was continued per heart failure team and potassium was supplemented. His creatinine increased to 1.56. He had leukocytosis and a low grade fever, blood cultures were ordered and showed Pseudomonas, his central line was replaced and cefepime and vancomycin were started. His swan and introducer were removed for a line holiday.  Infectious disease consult was obtained for ABX  guidance, he was started on Meropenem but this was changed back to Cefepime. Leukocytosis began trending down. He was deconditioned so PT/OT was consulted and recommended outpatient cardiac rehab. Atrial ectopy improved,  he was transitioned to oral Amiodarone '200mg'$  BID. Oxygen was weaned as tolerated. The patient's heparin was paused and his EPW were removed. He was restarted on Eliquis therapy for previous history of PE diagnosed in March of 2023. Dobutamine was weaned as tolerated. He had an acute kidney injury with a max creatinine of 3.48 which began trending down to 1.83 on 01/22. He was felt stable for transfer to the progressive unit.

## 2022-03-12 NOTE — Op Note (Signed)
[address]       [place] [address]             [phone]                                          03/12/2022 Patient:  Worthy Keeler Pre-Op Dx: Darral Dash coronary artery disease   History of a STEMI   Congestive heart failure with an EF of 20 to 25%   History of intra-aortic balloon pump placement Post-op Dx: Same Procedure: CABG X 2.  LIMA to LAD, reverse saphenous vein graft to ramus intermedius Endoscopic greater saphenous vein harvest on the right   Surgeon and Role:      * Aramis Zobel, Lucile Crater, MD - Primary    *B. Stehler, PA-C - assisting An experienced assistant was required given the complexity of this surgery and the standard of surgical care. The assistant was needed for exposure, dissection, suctioning, retraction of delicate tissues and sutures, instrument exchange and for overall help during this procedure.    Anesthesia  general EBL: 750 ml Blood Administration: None Xclamp Time: 28 min Pump Time: 89 min  Drains: 19 F blake drain:  R, L, mediastinal  Wires: Atrial and ventricular Counts: correct   Indications: This is a 67 year old gentleman that presented on 03/10/2022 with a STEMI.  He subsequently had an intra-aortic balloon pump placed for recovery.  He was brought to the operating theater for surgical revascularization.  Findings: There is an acute infarct evident along the anterior wall.  This portion of the left ventricle was boggy with limited motion on echocardiogram.  After surgical revascularization there is still was limited contractility along the anterior wall and apex.  We failed to separate from cardiopulmonary bypass on 2 occasions.  After increasing inotropic support significantly we were able to separate from bypass.  PA pressures or 3 for systemic thus we had a multidisciplinary discussion about whether or not the patient would benefit from mechanical circulatory support.  During this time the patient remained off  bypass for about 30 minutes and had improvement in his systemic blood pressure.  We started inhaled nitric oxide which was able to lower his pulmonary artery pressures.  We also were able to come down on some of the chemical support.  Given this improvement we elected not to pursue ECMO support.  Operative Technique: All invasive lines were placed in pre-op holding.  After the risks, benefits and alternatives were thoroughly discussed, the patient was brought to the operative theatre.  Anesthesia was induced, and the patient was prepped and draped in normal sterile fashion.  An appropriate surgical pause was performed, and pre-operative antibiotics were dosed accordingly.  We began with simultaneous incisions along the right leg for harvesting of the greater saphenous vein and the chest for the sternotomy.  In regards to the sternotomy, this was carried down with bovie cautery, and the sternum was divided with a reciprocating saw.  Meticulous hemostasis was obtained.  The left internal thoracic artery was exposed and harvested in in pedicled fashion.  The patient was systemically heparinized, and the artery was divided distally, and placed in a papaverine sponge.    The sternal elevator was removed, and a retractor was placed.  The pericardium was divided in the midline and fashioned into a cradle with pericardial stitches.   After we confirmed  an appropriate ACT, the ascending aorta was cannulated in standard fashion.  The right atrial appendage was used for venous cannulation site.  Cardiopulmonary bypass was initiated, and the heart retractor was placed. The cross clamp was applied, and a dose of anterograde cardioplegia was given with good arrest of the heart.    Next, we exposed the anterior wall of the heart and identified a good target on ramus intermedius.   An arteriotomy was created.  The vein was anastomosed in an end to side fashion.  Finally, we exposed a good target on the LAD, and fashioned an  end to side anastomosis between it and the LITA.  We began to re-warm, and a re-animation dose of cardioplegia was given.  The heart was de-aired, and the cross clamp was removed.  Meticulous hemostasis was obtained.    A partial occludding clamp was then placed on the ascending aorta, and we created an end to side anastomosis between it and the proximal vein graft.  Rings were placed on the proximal anastomosis.  Hemostasis was obtained, and we separated from cardiopulmonary bypass on the third attempt after increasing inotropic support.  The heparin was reversed with protamine.  Chest tubes and wires were placed, and the sternum was re-approximated with sternal wires.  The soft tissue and skin were re-approximated wth absorbable suture.    The patient tolerated the procedure without any immediate complications, and was transferred to the ICU in guarded condition.  Lebert Lovern Bary Leriche

## 2022-03-12 NOTE — Anesthesia Procedure Notes (Signed)
Arterial Line Insertion Start/End1/11/2022 8:00 AM, 03/12/2022 8:15 AM Performed by: Betha Loa, CRNA, CRNA  Patient location: OR. Preanesthetic checklist: patient identified, IV checked, site marked, risks and benefits discussed, surgical consent, monitors and equipment checked, pre-op evaluation and timeout performed Left, radial was placed Catheter size: 20 G Hand hygiene performed , maximum sterile barriers used  and Seldinger technique used Allen's test indicative of satisfactory collateral circulation Attempts: 2 Procedure performed without using ultrasound guided technique. Following insertion, Biopatch and dressing applied. Post procedure assessment: normal  Patient tolerated the procedure well with no immediate complications.

## 2022-03-12 NOTE — Anesthesia Procedure Notes (Signed)
Procedure Name: Intubation Date/Time: 03/12/2022 7:50 AM  Performed by: Lavell Luster, CRNAPre-anesthesia Checklist: Patient identified, Emergency Drugs available, Suction available, Timeout performed and Patient being monitored Patient Re-evaluated:Patient Re-evaluated prior to induction Oxygen Delivery Method: Circle system utilized Preoxygenation: Pre-oxygenation with 100% oxygen Induction Type: IV induction Ventilation: Mask ventilation without difficulty Laryngoscope Size: Mac and 4 Grade View: Grade II Tube type: Oral Tube size: 7.5 mm Airway Equipment and Method: Stylet Placement Confirmation: ETT inserted through vocal cords under direct vision, positive ETCO2 and breath sounds checked- equal and bilateral Secured at: 22 cm Tube secured with: Tape Dental Injury: Teeth and Oropharynx as per pre-operative assessment

## 2022-03-12 NOTE — Progress Notes (Signed)
Called Dr. Kipp Brood regarding increased agitation, swan leaking saline from hub (and very dampened PAP waveform reading ~15), and to verify milrinone ordered rate.  Per MD, Dex gtt ceiling increased from 0.7 to 1.29mg, will d/c swan, and will keep milrinone at 0.375. Orders updated.  RHenreitta Leber RN 8:39 PM 03/12/22

## 2022-03-12 NOTE — Anesthesia Procedure Notes (Signed)
Central Venous Catheter Insertion Performed by: Duane Boston, MD, anesthesiologist Start/End1/11/2022 8:08 AM, 03/12/2022 8:18 AM Patient location: OR. Preanesthetic checklist: patient identified, IV checked, site marked, risks and benefits discussed, surgical consent, monitors and equipment checked, pre-op evaluation, timeout performed and anesthesia consent Position: Trendelenburg Lidocaine 1% used for infiltration and patient sedated Hand hygiene performed , maximum sterile barriers used  and Seldinger technique used Catheter size: 9 Fr Total catheter length 8. PA cath was placed.Sheath introducer Swan type:thermodilution PA Cath depth:50 Procedure performed without using ultrasound guided technique. Ultrasound Notes:anatomy identified, needle tip was noted to be adjacent to the nerve/plexus identified, no ultrasound evidence of intravascular and/or intraneural injection and image(s) printed for medical record Attempts: 1 Following insertion, line sutured and dressing applied. Post procedure assessment: free fluid flow, blood return through all ports and no air  Patient tolerated the procedure well with no immediate complications.

## 2022-03-12 NOTE — Consult Note (Addendum)
Advanced Heart Failure Team Consult Note   Primary Physician: Lurline Del, DO PCP-Cardiologist:  None  Reason for Consultation: Acute MI Cardiogenic Shock  HPI:    Juan Snow is seen today for evaluation of acute MI cardiogenic shock at the request of Dr. Kipp Brood, [place] [facility].   67 y/o male w/ h/o submassive PE in March 2023 treated with catheter directed thrombolysis, on Xarelto, admitted 1/8 w/ acute anterolateral STEMI. Incidentally found to be COVID +. Emergent cath showed high grade ostial LAD to proximal LAD lesion in the 80% stenosis range as well as a 80% ramus intermedius lesion. Ventriculography demonstrated an anterior wall motion abnormality with an LVEDP of 22 mmHg. Multiple viewing angles were employed to try to separate the ostium of the ramus from the ostium of the LAD. This could not be accomplished. Given that the patient had TIMI-3 flow in all vessels, percutaneous intervention was deferred. Felt best suited for surgical revascularization. IABP placed. Referred for CABG. Pre-op TTE showed severely reduced LVEF 25-30%, RV normal, trivial MR w/ AK of the mid and distal anterior wall, mid and distal lateral wall, mid and  distal  anterior septum, entire apex, mid anterolateral segment, and mid inferoseptal walls (prior Echo 3/23 in setting of submassive PE w/ mod reduced RV and LVEF 50-55%, repeat limited echo 6/23 showed RV function returned to normal w/ LVEF up to 60-65%).  Went to OR today for CABG x 2 w/ LIMA-LAD and SVG-ramus. AHF team asked to assist w/ management of systolic heart failure.   Out of OR on DBA 5, Milrinone 0.375, NE 6, VP 0.03. IABP 1:1  [place]wan #s CVP 14 PAP 43/24 (31) CO 4.48 CI 2.25  Co-ox pending  PAPi 1.35   MAP 70    Cardiac Studies  Emergent LHC 03/10/22   Ost LAD to Prox LAD lesion is 80% stenosed.   Ramus lesion is 80% stenosed.  Diagnostic Dominance: Right    Echo 03/11/22 Left ventricular ejection fraction, by estimation,  is 25 to 30%. The left ventricle has severely decreased function. The left ventricle demonstrates regional wall motion abnormalities (see scoring diagram/findings for description). Left ventricular diastolic parameters are consistent with Grade II diastolic dysfunction (pseudonormalization). 1. Right ventricular systolic function is normal. The right ventricular size is normal. There is mildly elevated pulmonary artery systolic pressure. 2. The mitral valve is normal in structure. Trivial mitral valve regurgitation. No evidence of mitral stenosis. 3. The aortic valve is tricuspid. Aortic valve regurgitation is not visualized. No aortic stenosis is present. 4. The inferior vena cava is normal in size with greater than 50% respiratory variability, suggesting right atrial pressure of 3 mmHg.  LV Wall Scoring:  The mid and distal anterior wall, mid and distal lateral wall, mid and  distal  anterior septum, entire apex, mid anterolateral segment, and mid  inferoseptal segment are akinetic.    Prior Echos 05/2021 (Setting of Submassive PE): LVEF 50-55%, RV severely enlarged w/ mod reduced systolic function  09/6193 (Limited Echo, post tx PE): LVEF 60-65%, normal RV systolic function   ______________________________ Review of Systems: Unable to obtain currently due to patient condition, intubated and sedated.    Home Medications Prior to Admission medications   Medication Sig Start Date End Date Taking? Authorizing Provider  Ascorbic Acid (VITAMIN C PO) Take 1 tablet by mouth daily.   Yes [provider]  Cholecalciferol (VITAMIN D-3 PO) Take 1 tablet by mouth daily.   Yes [provider]  Coenzyme Q10 (CO Q-10 PO) Take 1 capsule by mouth daily.   Yes [provider]  famotidine-calcium carbonate-magnesium hydroxide (PEPCID COMPLETE) 10-800-165 MG chewable tablet Chew 1-2 tablets by mouth 2 (two) times daily as needed (acid reflux).   Yes [provider]  Menaquinone-7 (VITAMIN K2 PO) Take 1 tablet by mouth daily.   Yes [provider]  Omega-3 Fatty Acids (FISH OIL) 1000 MG CAPS Take 1 capsule (1,000 mg total) by mouth daily. 05/13/21  Yes Icard, Bradley L, DO  OVER THE COUNTER MEDICATION Take 15 mLs by mouth daily. Black Seed Oil.   Yes [provider]  RIVAROXABAN Alveda Reasons) VTE STARTER PACK (15 & 20 MG) Follow package directions: Take one '15mg'$  tablet by mouth twice a day. On day 22, switch to one '20mg'$  tablet once a day. Take with food. 12/06/21  Yes Soto, Beverley Fiedler, PA-C  apixaban (ELIQUIS) 5 MG TABS tablet Take 2 tablets (10 mg total) by mouth 2 (two) times daily. '10mg'$  two times day for 6  more days, Then decrease to '5mg'$  two times daily Patient not taking: Reported on 12/06/2021 05/13/21   [name], NP    Past Medical History: Past Medical History:  Diagnosis Date   Arthritis    OA   Cough    WHITE SPUTUM OCC, NO FEVER COLD SYMPTOMS MAINLY   GERD (gastroesophageal reflux disease)     Past Surgical History: Past Surgical History:  Procedure Laterality Date   CHOLECYSTECTOMY N/A 03/30/2021   Procedure: LAPAROSCOPIC CHOLECYSTECTOMY WITH INTRAOPERATIVE CHOLANGIOGRAM;  Surgeon: Armandina Gemma, MD;  Location: [facility];  Service: General;  Laterality: N/A;   COLONOSCOPY  06/17/2011   Procedure: COLONOSCOPY;  Surgeon: [name], MD;  Location: [facility];  Service: Endoscopy;  Laterality: N/A;   CORONARY/GRAFT ACUTE MI REVASCULARIZATION N/A 03/10/2022   Procedure: Coronary/Graft Acute MI Revascularization;  Surgeon: Early Osmond, MD;  Location: [place] [facility];  Service: Cardiovascular;  Laterality: N/A;   IABP INSERTION N/A 03/10/2022   Procedure: IABP Insertion;  Surgeon: Early Osmond, MD;  Location: [place] [facility];  Service: Cardiovascular;  Laterality: N/A;   IR ANGIOGRAM PULMONARY BILATERAL SELECTIVE  05/11/2021   IR ANGIOGRAM SELECTIVE EACH ADDITIONAL VESSEL  05/11/2021   IR ANGIOGRAM SELECTIVE EACH  ADDITIONAL VESSEL  05/11/2021   IR RADIOLOGIST EVAL & MGMT  06/06/2021   IR RADIOLOGIST EVAL & MGMT  09/06/2021   IR RADIOLOGIST EVAL & MGMT  12/26/2021   IR THROMBECT VENO MECH MOD SED  05/11/2021   IR US GUIDE VASC ACCESS RIGHT  05/11/2021   KNEE SURGERY Right YRS AGO   MENISCUS   LEFT HEART CATH AND CORONARY ANGIOGRAPHY N/A 03/10/2022   Procedure: LEFT HEART CATH AND CORONARY ANGIOGRAPHY;  Surgeon: Early Osmond, MD;  Location: [place] [facility];  Service: Cardiovascular;  Laterality: N/A;   LEFT HEART CATH AND CORONARY ANGIOGRAPHY N/A 03/10/2022   Procedure: LEFT HEART CATH AND CORONARY ANGIOGRAPHY;  Surgeon: Early Osmond, MD;  Location: [place] [facility];  Service: Cardiovascular;  Laterality: N/A;   THUMB ARTHROSCOPY Right YRS AGO   TOTAL HIP ARTHROPLASTY Left 06/25/2017   Procedure: LEFT TOTAL HIP ARTHROPLASTY ANTERIOR APPROACH;  Surgeon: Gaynelle Arabian, MD;  Location: [facility];  Service: Orthopedics;  Laterality: Left;    Family History: Family History  Problem Relation Age of Onset   CAD Brother    Hypertension Other    Diabetes Neg Hx     Social History: Social  History   Socioeconomic History   Marital status: Married    Spouse name: Not on file   Number of children: Not on file   Years of education: Not on file   Highest education level: Not on file  Occupational History   Not on file  Tobacco Use   Smoking status: Never   Smokeless tobacco: Never  Vaping Use   Vaping Use: Never used  Substance and Sexual Activity   Alcohol use: No   Drug use: No   Sexual activity: Not on file  Other Topics Concern   Not on file  Social History Narrative   Not on file   Social Determinants of Health   Financial Resource Strain: Not on file  Food Insecurity: No Food Insecurity (03/10/2022)   Hunger Vital Sign    Worried About Running Out of Food in the Last Year: Never true    Ran Out of Food in the Last Year: Never true  Transportation Needs: No Transportation Needs  (03/10/2022)   PRAPARE - Hydrologist (Medical): No    Lack of Transportation (Non-Medical): No  Physical Activity: Not on file  Stress: Not on file  Social Connections: Not on file    Allergies:  No Known Allergies  Objective:    Vital Signs:   Temp:  [98.2 F (36.8 C)-98.9 F (37.2 C)] 98.2 F (36.8 C) (01/09 0000) Pulse Rate:  [78-92] 91 (01/09 0700) BP: (115-148)/(59-100) 144/75 (01/09 0700) SpO2:  [90 %-96 %] 92 % (01/09 0700) Arterial Line BP: (97-128)/(55-79) 120/69 (01/09 0700) Weight:  [87.4 kg] 87.4 kg (01/09 0600) Last BM Date : 03/10/22  Weight change: Filed Weights   03/10/22 1400 03/11/22 0303 03/12/22 0600  Weight: 85 kg 93.2 kg 87.4 kg    Intake/Output:   Intake/Output Summary (Last 24 hours) at 03/12/2022 1212 Last data filed at 03/12/2022 1153 Gross per 24 hour  Intake 3352.92 ml  Output 1470 ml  Net 1882.92 ml      Physical Exam    General:  intubated and sedated. No resp difficulty HEENT: normal + ETT  Neck: supple. JVP elevated . Carotids 2+ no bruits. No lymphadenopathy or thyromegaly appreciated. Cor: PMI nondisplaced. Regular rhythm, tachy rate. No rubs, gallops or murmurs. + sternal dressing, + CTs Lungs: intubated and clear  Abdomen: soft, nontender, nondistended. No hepatosplenomegaly. No bruits or masses. Good bowel sounds. Extremities: no cyanosis, clubbing, rash, edema + left fem IABP  Neuro: intubated and sedated    Telemetry    NSR 90s   EKG    Acute anterolateral ST elevations  Labs   Basic Metabolic Panel: Recent Labs  Lab 03/10/22 1155 03/11/22 0611 03/12/22 0600 03/12/22 0852 03/12/22 0855 03/12/22 1007 03/12/22 1025 03/12/22 1031 03/12/22 1054 03/12/22 1123  NA 139 133* 134* 135   < > 136 137 141 135 135  K 3.1* 4.0 4.0 4.0   < > 4.4 4.4 4.0 5.2* 5.5*  CL 105 102 101 101  --  103  --   --  102  --   CO2 18* 20* 21*  --   --   --   --   --   --   --   GLUCOSE 130* 150* 132* 144*   --  133*  --   --  131*  --   BUN '20 15 15 16  '$ --  16  --   --  15  --   CREATININE 1.88* 1.22  1.49* 1.30*  --  1.20  --   --  1.30*  --   CALCIUM 9.1 8.4* 8.7*  --   --   --   --   --   --   --   MG  --  1.9 1.9  --   --   --   --   --   --   --    < > = values in this interval not displayed.    Liver Function Tests: Recent Labs  Lab 03/10/22 1155  AST 26  ALT 24  ALKPHOS 48  BILITOT 0.5  PROT 7.0  ALBUMIN 3.7   No results for input(s): "LIPASE", "AMYLASE" in the last 168 hours. No results for input(s): "AMMONIA" in the last 168 hours.  CBC: Recent Labs  Lab 03/10/22 1155 03/11/22 0611 03/12/22 0600 03/12/22 0852 03/12/22 1025 03/12/22 1031 03/12/22 1054 03/12/22 1101 03/12/22 1123  WBC 7.6 12.1* 12.2*  --   --   --   --   --   --   NEUTROABS 4.9  --   --   --   --   --   --   --   --   HGB 17.5* 16.5 17.5*   < > 10.9* 10.9* 10.2* 11.1* 10.5*  HCT 52.3* 48.3 51.7   < > 32.0* 32.0* 30.0* 33.2* 31.0*  MCV 91.0 88.8 89.0  --   --   --   --   --   --   PLT 189 144* 111*  --   --   --   --  92*  --    < > = values in this interval not displayed.    Cardiac Enzymes: No results for input(s): "CKTOTAL", "CKMB", "CKMBINDEX", "TROPONINI" in the last 168 hours.  BNP: BNP (last 3 results) Recent Labs    05/11/21 1052  BNP 23.1    ProBNP (last 3 results) No results for input(s): "PROBNP" in the last 8760 hours.   CBG: No results for input(s): "GLUCAP" in the last 168 hours.  Coagulation Studies: Recent Labs    03/10/22 1155  LABPROT 16.6*  INR 1.4*     Imaging   No results found.   Medications:     Current Medications:  [MAR Hold] aspirin  81 mg Oral Daily   [MAR Hold] atorvastatin  80 mg Oral Daily   [MAR Hold] Chlorhexidine Gluconate Cloth  6 each Topical Daily   heparin sodium (porcine) 2,500 Units, papaverine 30 mg in electrolyte-A (PLASMALYTE-A PH 7.4) 500 mL irrigation   Irrigation To OR   insulin   Intravenous To OR   [place] Blood  Cardioplegia vial (lidocaine/magnesium/mannitol 0.26g-4g-6.4g)   Intracoronary To OR   [MAR Hold] melatonin  3 mg Oral QHS   [MAR Hold] pantoprazole  40 mg Oral Daily   potassium chloride  80 mEq Other To OR   [MAR Hold] sodium chloride flush  3 mL Intravenous Q12H   tranexamic acid  2 mg/kg Intracatheter To OR    Infusions:  sodium chloride     [MAR Hold] sodium chloride      ceFAZolin (ANCEF) IV      ceFAZolin (ANCEF) IV     DOBUTamine     heparin 30,000 units/NS 1000 mL solution for CELLSAVER     heparin 750 Units/hr (03/12/22 0700)   [MAR Hold] nitroGLYCERIN     vasopressin        Patient Profile   67 y/o male w/  h/o submassive PE in March 2023 w/ subsequent RV dysfunction that recovered post treatment with catheter directed thrombolysis, on Xarelto, admitted w/ large acute anterolateral STEMI c/b acute systolic heart failure/shock requiring placement of IABP. Now s/p CABG. AHF team consulted to assist w/ management of HF. Also COVID +.    Assessment/Plan   1. Large Acute Anterolateral STEMI/CAD  - LHC w/ high-grade ostial LAD lesion as well as likely ramus involvement.  Given the lack of adequate viewing angles/inability to separate the ostium of the ramus from the ostium of the LAD, PCI was deferred. Underwent CABG x 2 (LIMA-LAD, SVG-Ramus)  - ASA + statin  - no ? blocker yet until off inotropes/pressors   2. Acute Systolic Heart Failure  - ICM, CAD outlined above  - Prior Echo 6/23 EF 60-65%, RV nl  - Echo this admit EF 25-30% RV nl, trivial MR. Mid-distal anterior/lateral/inferoseptal walls AK - Now s/p CABG x 2  - continue IABP + inotropic/ pressor support. Currently on DBA 5, Milrinone 0.375, NE 6 and VP 0.03. iNO 30. IAPI at 1:1. CI 2.25, PAPi 1.35 - Follow hemodynamics closely off swan. Wean pressors as tolerated    3. H/o Submassive PE - diagnosed 05/2021 w/ RV stain - treated w/ catheter directed thrombolysis w/ recovery of RV fx - on Xarelto PTA, currently  on hold during perioperative period   4. HLD - LDL elevated 121, Goal < 55 - treat w/ high dose statin therapy, atorva 80 mg initiated   5. COVID +  - currently intubated - per PCCM   Length of Stay: 2  Lyda Jester, PA-C  03/12/2022, 12:12 PM  Advanced Heart Failure Team Pager [phone] (M-F; 7a - 5p)  Please contact [facility] for night-coverage after hours (4p -7a ) and weekends on [email or web address]  Patient seen with PA, agree with the above note.   Currently post-CABG.  Pre-op echo showed EF 25-30% with wall motion abnormalities and normal RV function.  Patient had CABG with SVG-ramus and LIMA-LAD.  During surgery, he developed worsening of LV and RV systolic function with acute elevation in PA pressure.  He was difficult to wean off bypass.  With iNO, inotropes, and pressors, he was eventually able to wean off bypass.  He also has IABP (placed pre-op for anginal control) 1:1.   Currently stable MAP on iNO 30, epinephrine 3, vasopressin 0.03, NE 6, milrinone 0.375, dobutamine 5. IABP 1:1. Co-ox 74%, PA pressure down to 36/13 on my read just now.  CI 1.9-2.3 by thermodilution.   General: Intubated/sedated.  Neck: JVP 10 cm, no thyromegaly or thyroid nodule.  Lungs: Decreased at bases.  CV: Nondisplaced PMI.  Heart regular S1/S2, no S3/S4, no murmur.  No peripheral edema.  No carotid bruit.  Difficult to palpate pedal pulses.  Abdomen: Soft, nontender, no hepatosplenomegaly, no distention.  Skin: Intact without lesions or rashes.  Neurologic: Sedated on vent.  Extremities: No clubbing or cyanosis.  HEENT: Normal.   1. Cardiogenic shock/ischemic cardiomyopathy: Echo pre-op with LV EF 25-30%, wall motion abnormalities, normal RV.  IABP placed pre-op for management of angina.  Intra-op, patient developed acute worsening of LV and RV function as well as acute rise in PA pressure (PASBP 80s-90s).  Difficult to wean off bypass, but eventually successful wean with iNO, inotropes,  pressors.  Currently on IABP 1:1, iNO 30, epinephrine 3, NE 6, dobutamine 5, milrinone 0.375, vasopressin 0.03.  PA pressure down to 36/13 currently, co-ox 74% with CI ranging 1.9-2.3 by  thermodilution.  - Slow wean of pressors as tolerated, would decrease vasopressin first if room.  - Continue milrinone and dobutamine without change.  - Continue iNO.  - Continue IABP 1:1.  Will need eventual anticoagulation when stable for this per surgery.  - Hope he will be stable for starting diuresis tomorrow.  2. CAD: Anterolateral MI with cath showing 80% ostial LAD stenosis and 80% ostial large ramus stenosis.  Ostial lesions in close proximity made PCI very difficult and CABG was elected.  Patient underwent LIMA-LAD and SVG-ramus today.  - ASA - Resume statin.  3. H/o PE: Submassive PE in 3/23, treated with catheter-directed thrombolysis.   - Resume anticoagulation when stable from surgical standpoint.  4. COVID-19+: Incidental finding at admission. Not on antivirals.  5. Acute hypoxemic respiratory failure: Intubated post CABG.  He will need eventual diuresis.   Loralie Champagne 03/12/2022 4:47 PM

## 2022-03-12 NOTE — Progress Notes (Signed)
     [phone]       [place] [address]             [phone]       No events  Vitals:   03/12/22 0600 03/12/22 0700  BP: (!) 148/78 (!) 144/75  Pulse: 89 91  Resp:    Temp:    SpO2: 91% 92%   Alert NAD Sinus EWOB IABP in place  OR today for CABG 2  [name]

## 2022-03-12 NOTE — Progress Notes (Signed)
  Echocardiogram Echocardiogram Transesophageal has been performed.  Bobbye Charleston 03/12/2022, 9:07 AM

## 2022-03-13 ENCOUNTER — Inpatient Hospital Stay (HOSPITAL_COMMUNITY): Payer: Medicare Other

## 2022-03-13 ENCOUNTER — Encounter (HOSPITAL_COMMUNITY): Payer: Self-pay | Admitting: Thoracic Surgery (Cardiothoracic Vascular Surgery)

## 2022-03-13 DIAGNOSIS — I2102 ST elevation (STEMI) myocardial infarction involving left anterior descending coronary artery: Secondary | ICD-10-CM | POA: Diagnosis not present

## 2022-03-13 DIAGNOSIS — R570 Cardiogenic shock: Secondary | ICD-10-CM | POA: Diagnosis not present

## 2022-03-13 LAB — CBC
HCT: 30.7 % — ABNORMAL LOW (ref 39.0–52.0)
HCT: 28.9 % — ABNORMAL LOW (ref 39.0–52.0)
Hemoglobin: 11 g/dL — ABNORMAL LOW (ref 13.0–17.0)
Hemoglobin: 10.3 g/dL — ABNORMAL LOW (ref 13.0–17.0)
MCH: 31.4 pg (ref 26.0–34.0)
MCH: 31.4 pg (ref 26.0–34.0)
MCHC: 35.8 g/dL (ref 30.0–36.0)
MCHC: 35.6 g/dL (ref 30.0–36.0)
MCV: 87.7 fL (ref 80.0–100.0)
MCV: 88.1 fL (ref 80.0–100.0)
Platelets: 57 10*3/uL — ABNORMAL LOW (ref 150–400)
Platelets: 57 10*3/uL — ABNORMAL LOW (ref 150–400)
RBC: 3.5 MIL/uL — ABNORMAL LOW (ref 4.22–5.81)
RBC: 3.28 MIL/uL — ABNORMAL LOW (ref 4.22–5.81)
RDW: 13.3 % (ref 11.5–15.5)
RDW: 13.5 % (ref 11.5–15.5)
WBC: 9.8 10*3/uL (ref 4.0–10.5)
WBC: 9.8 10*3/uL (ref 4.0–10.5)
nRBC: 0 % (ref 0.0–0.2)
nRBC: 0 % (ref 0.0–0.2)

## 2022-03-13 LAB — GLUCOSE, CAPILLARY
Glucose-Capillary: 104 mg/dL — ABNORMAL HIGH (ref 70–99)
Glucose-Capillary: 105 mg/dL — ABNORMAL HIGH (ref 70–99)
Glucose-Capillary: 110 mg/dL — ABNORMAL HIGH (ref 70–99)
Glucose-Capillary: 114 mg/dL — ABNORMAL HIGH (ref 70–99)
Glucose-Capillary: 115 mg/dL — ABNORMAL HIGH (ref 70–99)
Glucose-Capillary: 118 mg/dL — ABNORMAL HIGH (ref 70–99)
Glucose-Capillary: 119 mg/dL — ABNORMAL HIGH (ref 70–99)
Glucose-Capillary: 122 mg/dL — ABNORMAL HIGH (ref 70–99)
Glucose-Capillary: 125 mg/dL — ABNORMAL HIGH (ref 70–99)
Glucose-Capillary: 127 mg/dL — ABNORMAL HIGH (ref 70–99)
Glucose-Capillary: 132 mg/dL — ABNORMAL HIGH (ref 70–99)
Glucose-Capillary: 132 mg/dL — ABNORMAL HIGH (ref 70–99)
Glucose-Capillary: 133 mg/dL — ABNORMAL HIGH (ref 70–99)
Glucose-Capillary: 139 mg/dL — ABNORMAL HIGH (ref 70–99)
Glucose-Capillary: 140 mg/dL — ABNORMAL HIGH (ref 70–99)
Glucose-Capillary: 162 mg/dL — ABNORMAL HIGH (ref 70–99)
Glucose-Capillary: 182 mg/dL — ABNORMAL HIGH (ref 70–99)
Glucose-Capillary: 99 mg/dL (ref 70–99)

## 2022-03-13 LAB — BASIC METABOLIC PANEL
Anion gap: 6 (ref 5–15)
Anion gap: 9 (ref 5–15)
BUN: 16 mg/dL (ref 8–23)
BUN: 17 mg/dL (ref 8–23)
CO2: 23 mmol/L (ref 22–32)
CO2: 24 mmol/L (ref 22–32)
Calcium: 7.8 mg/dL — ABNORMAL LOW (ref 8.9–10.3)
Calcium: 7.7 mg/dL — ABNORMAL LOW (ref 8.9–10.3)
Chloride: 104 mmol/L (ref 98–111)
Chloride: 101 mmol/L (ref 98–111)
Creatinine, Ser: 1.36 mg/dL — ABNORMAL HIGH (ref 0.61–1.24)
Creatinine, Ser: 1.42 mg/dL — ABNORMAL HIGH (ref 0.61–1.24)
GFR, Estimated: 58 mL/min — ABNORMAL LOW (ref >60)
GFR, Estimated: 55 mL/min — ABNORMAL LOW (ref >60)
Glucose, Bld: 99 mg/dL (ref 70–99)
Glucose, Bld: 88 mg/dL (ref 70–99)
Potassium: 4.2 mmol/L (ref 3.5–5.1)
Potassium: 4 mmol/L (ref 3.5–5.1)
Sodium: 133 mmol/L — ABNORMAL LOW (ref 135–145)
Sodium: 134 mmol/L — ABNORMAL LOW (ref 135–145)

## 2022-03-13 LAB — COOXEMETRY PANEL
Carboxyhemoglobin: 1.6 % — ABNORMAL HIGH (ref 0.5–1.5)
Methemoglobin: <0.7 % (ref 0.0–1.5)
O2 Saturation: 61.3 %
Total hemoglobin: 10.5 g/dL — ABNORMAL LOW (ref 12.0–16.0)

## 2022-03-13 LAB — MAGNESIUM
Magnesium: 1.8 mg/dL (ref 1.7–2.4)
Magnesium: 2.4 mg/dL (ref 1.7–2.4)

## 2022-03-13 MED ORDER — FUROSEMIDE 10 MG/ML IJ SOLN
40.0000 mg | Freq: Once | INTRAMUSCULAR | Status: AC
  Administered 2022-03-13: 40 mg via INTRAVENOUS
  Filled 2022-03-13: qty 4

## 2022-03-13 MED ORDER — MAGNESIUM SULFATE 2 GM/50ML IV SOLN
2.0000 g | Freq: Once | INTRAVENOUS | Status: AC
  Administered 2022-03-13: 2 g via INTRAVENOUS
  Filled 2022-03-13: qty 50

## 2022-03-13 MED ORDER — FENTANYL CITRATE PF 50 MCG/ML IJ SOSY
100.0000 ug | PREFILLED_SYRINGE | Freq: Once | INTRAMUSCULAR | Status: DC
  Filled 2022-03-13: qty 2

## 2022-03-13 MED ORDER — FENTANYL CITRATE PF 50 MCG/ML IJ SOSY
PREFILLED_SYRINGE | INTRAMUSCULAR | Status: AC
  Administered 2022-03-13: 50 ug
  Filled 2022-03-13: qty 2

## 2022-03-13 MED ORDER — NOREPINEPHRINE 4 MG/250ML-% IV SOLN
0.0000 ug/min | INTRAVENOUS | Status: DC
  Filled 2022-03-13: qty 250

## 2022-03-13 NOTE — Progress Notes (Signed)
   NAME:  Juan Snow, MRN:  [id number], DOB:  October 26, 1955, LOS: 3 ADMISSION DATE:  03/10/2022, CONSULTATION DATE:  03/12/22 REFERRING MD:  Kipp Brood, CHIEF COMPLAINT:  CABG   History of Present Illness:  67 year old man presented with chest pain on 03/10/22 found to have STEMI and severe 2V CAD, IABP placed with improvement in anginal symptoms and he was seen by cardiothoracic surgery.  Underwent CABG x 2 today (LIMA-LAD, SVG-ramus) with intra-op course complicated by difficulty weaning off bypass due to high PA pressures and low LV function; with iNO and high dose inotropes+pressors, able to be eventually weaned off pump.  Brought to ICU on pressors, inotropes, iNO, IABP, vent.  PCCM consulted to assist with management.   Pertinent  Medical History  GERD Prior VTE on [facility] PTA Arthtritis  Significant Hospital Events: Including procedures, antibiotic start and stop dates in addition to other pertinent events   1/7 admit 1/9 CABG x 2, saphenous graft harvest  Interim History / Subjective:  No events. Awake on vent. Remains on iNO.  Objective   Blood pressure 98/67, pulse 66, temperature 99.1 F (37.3 C), resp. rate (!) 27, height '5\' 7"'$  (1.702 m), weight 98.9 kg, SpO2 99 %. PAP: (12-48)/(9-31) 21/18 CVP:  [8 mmHg-19 mmHg] 8 mmHg CO:  [3.8 L/min-4.6 L/min] 4.6 L/min CI:  [1.9 L/min/m2-2.3 L/min/m2] 2.3 L/min/m2  Vent Mode: PRVC FiO2 (%):  [60 %-100 %] 60 % Set Rate:  [15 bmp-26 bmp] 26 bmp Vt Set:  [530 mL] 530 mL PEEP:  [8 cmH20] 8 cmH20 Plateau Pressure:  [16 cmH20-22 cmH20] 16 cmH20   Intake/Output Summary (Last 24 hours) at 03/13/2022 0959 Last data filed at 03/13/2022 0900 Gross per 24 hour  Intake 5148.69 ml  Output 1755 ml  Net 3393.69 ml    Filed Weights   03/11/22 0303 03/12/22 0600 03/13/22 0500  Weight: 93.2 kg 87.4 kg 98.9 kg    Examination: No distress Sternotomy site without strikethrough Mediastinal drains small output Foley in place Abd soft, hypoactive  BS No edema  Coox 61 Epi 2, Milrinone 0.375, iNO 30ppm Minimal vent settings Cr ok Plts 57  Resolved Hospital Problem list   N/A  Assessment & Plan:  Postoperative ventilator management Post cardiotomy shock state STEMI s/p 2V CABG Likely baseline CKD GERD Hx PE on AC PTA Thrombocytopenia question IABP shearing/hemolysis  - Drains, inotropes, IABP wean per TCTS/CHF teams - Pressors for MAP 70-80 - Lung protective tidal volumes limiting driving pressures to < 15cm H2O as able - Sedation titrated to vent compliance - VAP prevention bundle - Replace SGC and wean iNO then can work on SAT/SBT - Resume AC when deemed safe by [employer] (right click and "Reselect all SmartList Selections" daily)   Diet/type: NPO DVT prophylaxis: not indicated GI prophylaxis: PPI Lines: Central line Foley:  Yes, and it is still needed Code Status:  full code Last date of multidisciplinary goals of care discussion [per primary]  33 min cc time Erskine Emery MD PCCM

## 2022-03-13 NOTE — Procedures (Signed)
PA catheter note. Under sterile conditions PA catheter advanced into SVC, balloon dilated to 1 cc then with pressure tracing guidance advanced into pulmonary artery.  CXR pending.  Erskine Emery MD PCCM

## 2022-03-13 NOTE — Discharge Instructions (Signed)

## 2022-03-13 NOTE — TOC Progression Note (Signed)
Transition of Care [facility]) - Progression Note    Patient Details  Name: Juan Snow MRN: [id number] Date of Birth: 1955-09-15  [facility] CM/SW Contact  Erenest Rasher, RN Phone Number: [phone] 03/13/2022, 7:59 AM  Clinical Narrative:     Pt independent P'TA admission. LIves at home with wife. [facility] following for [facility]. Will continue to follow for dc needs.    Expected Discharge Plan: [place] Barriers to Discharge: Continued Medical Work up  Expected Discharge Plan and Services In-house Referral: NA Discharge Planning Services: CM Consult Post Acute Care Choice: NA Living arrangements for the past 2 months: Single Family Home                 DME Arranged: N/A DME Agency: NA       HH Arranged: RN [place] Agency: [place] Date HH Agency Contacted: 03/13/22 Time [place]: [phone] Representative spoke with at [place]: [place] Determinants of Health ([place]) Interventions SDOH Screenings   Food Insecurity: No Food Insecurity (03/10/2022)  Housing: Low Risk  (03/10/2022)  Transportation Needs: No Transportation Needs (03/10/2022)  Utilities: Not At Risk (03/10/2022)  Tobacco Use: Low Risk  (03/11/2022)    Readmission Risk Interventions     No data to display

## 2022-03-13 NOTE — Progress Notes (Addendum)
Advanced Heart Failure Rounding Note  PCP-Cardiologist: None   Subjective:    S/p CABG x 2   POD #1   NAE overnight.   Remains intubated and sedated. iNO at 30. Off NE and VP w/ MAP 68. Remains on DBA 5, Milrinone 0.375. Epi weaned down to 1.5. IABP at 1:1. Swan removed overnight due to malposition and "oozing" around site per RN. Still has transducer. Co-ox pending.   Rhythm stable, a-paced 15-20 cc/hr in UOP. SCr 1.54>>1.36  220   Hgb 11 220 cc CT output    Objective:   Weight Range: 98.9 kg Body mass index is 34.15 kg/m.   Vital Signs:   Temp:  [97 F (36.1 C)-99.3 F (37.4 C)] 99.1 F (37.3 C) (01/10 0600) Pulse Rate:  [78-258] 80 (01/10 0700) Resp:  [15-30] 26 (01/10 0700) BP: (104-127)/(56-85) 104/59 (01/10 0700) SpO2:  [93 %-99 %] 97 % (01/10 0700) Arterial Line BP: (86-178)/(46-77) 96/53 (01/10 0700) FiO2 (%):  [60 %-100 %] 60 % (01/10 0318) Weight:  [98.9 kg] 98.9 kg (01/10 0500) Last BM Date : 03/10/22  Weight change: Filed Weights   03/11/22 0303 03/12/22 0600 03/13/22 0500  Weight: 93.2 kg 87.4 kg 98.9 kg    Intake/Output:   Intake/Output Summary (Last 24 hours) at 03/13/2022 0709 Last data filed at 03/13/2022 0700 Gross per 24 hour  Intake 7461.84 ml  Output 1805 ml  Net 5656.84 ml      Physical Exam    General:  intubated and sedated  HEENT: Normal + ETT Neck: Supple. JVP elevated . Carotids 2+ bilat; no bruits. No lymphadenopathy or thyromegaly appreciated. Cor: PMI nondisplaced. Regular rate & rhythm. No rubs, gallops or murmurs. + sternal dressings + CTs  Lungs: intubated and clear  Abdomen: Soft, nontender, nondistended. No hepatosplenomegaly. No bruits or masses. Good bowel sounds. Extremities: No cyanosis, clubbing, rash, edema, + Rt fem IABP  GU: + Foley  Neuro: intubated and sedated   Telemetry   A-paced 80s   EKG    NSR w/ PACs   Labs    CBC Recent Labs    03/10/22 1155 03/11/22 0611 03/12/22 2000  03/13/22 0430  WBC 7.6   < > 11.0* 9.8  NEUTROABS 4.9  --   --   --   HGB 17.5*   < > 11.5* 11.0*  HCT 52.3*   < > 32.5* 30.7*  MCV 91.0   < > 89.3 87.7  PLT 189   < > 64* 57*   < > = values in this interval not displayed.   Basic Metabolic Panel Recent Labs    03/12/22 2000 03/13/22 0430  NA 134* 133*  K 4.2 4.2  CL 103 104  CO2 20* 23  GLUCOSE 198* 99  BUN 16 16  CREATININE 1.54* 1.36*  CALCIUM 8.1* 7.8*  MG 2.2 1.8   Liver Function Tests Recent Labs    03/10/22 1155  AST 26  ALT 24  ALKPHOS 48  BILITOT 0.5  PROT 7.0  ALBUMIN 3.7   No results for input(s): "LIPASE", "AMYLASE" in the last 72 hours. Cardiac Enzymes No results for input(s): "CKTOTAL", "CKMB", "CKMBINDEX", "TROPONINI" in the last 72 hours.  BNP: BNP (last 3 results) Recent Labs    05/11/21 1052  BNP 23.1    ProBNP (last 3 results) No results for input(s): "PROBNP" in the last 8760 hours.   D-Dimer No results for input(s): "DDIMER" in the last 72 hours. Hemoglobin A1C Recent Labs  03/10/22 1155  HGBA1C 6.2*   Fasting Lipid Panel Recent Labs    03/10/22 1155  CHOL 210*  HDL 41  LDLCALC 121*  TRIG 239*  CHOLHDL 5.1   Thyroid Function Tests No results for input(s): "TSH", "T4TOTAL", "T3FREE", "THYROIDAB" in the last 72 hours.  Invalid input(s): "FREET3"  Other results:   Imaging    ECHO INTRAOPERATIVE TEE  Result Date: 03/12/2022  *INTRAOPERATIVE TRANSESOPHAGEAL REPORT *  Patient Name:   LEMOINE GOYNE Date of Exam: 03/12/2022 Medical Rec #:  [id number]       Height:       67.0 in Accession #:    [id number]      Weight:       192.7 lb Date of Birth:  Mar 18, 1955        BSA:          1.99 m Patient Age:    67 years        BP:           144/75 mmHg Patient Gender: M               HR:           80 bpm. Exam Location:  Inpatient Transesophogeal exam was perform intraoperatively during surgical procedure. Patient was closely monitored under general anesthesia during the entirety of  examination. Indications:     I25.110 Atherosclerotic heart disease of native coronary artery                  with unstable angina pectoris Performing Phys: [id number] Lucile Crater LIGHTFOOT Diagnosing Phys: Duane Boston MD Complications: No known complications during this procedure. POST-OP IMPRESSIONS _ Comments: Cardiac function improved post bypass with administration of cardiac medications and nitric oxide. Final images prior to probe removal demonstrated improved LVfx but persistant anterior and lateral wall and apical hypokinesia. TV demonstrated improved TR. The exam was otherwise unchanged. PRE-OP FINDINGS  Left Ventricle: The left ventricle has severely reduced systolic function, with an ejection fraction of 20-30%. The cavity size was moderately dilated. There is no increase in left ventricular wall thickness.  LV Wall Scoring: The mid and distal lateral wall, mid and distal anterior septum, entire apex, mid and distal inferior wall, and mid inferoseptal segment are akinetic. The anterior wall, antero-lateral wall, basal anteroseptal segment, basal inferolateral segment, basal inferior segment, and basal inferoseptal segment are hypokinetic.  Right Ventricle: The right ventricle has normal systolic function. The cavity was dialated. There is no increase in right ventricular wall thickness. Left Atrium: Left atrial size was normal in size. No left atrial/left atrial appendage thrombus was detected. There is echo contrast seen in the left atrial cavity, left atrial appendage and left ventricle. Left atrial appendage velocity is reduced at less than 40 cm/s. Right Atrium: Right atrial size was dilated. There is mild spontaneous echo contrast seen. Interatrial Septum: No atrial level shunt detected by color flow Doppler. Pericardium: There is no evidence of pericardial effusion. Mitral Valve: The mitral valve is normal in structure. Mitral valve regurgitation is trivial by color flow Doppler. Tricuspid Valve: The  tricuspid valve was normal in structure. Tricuspid valve regurgitation is mild by color flow Doppler. No evidence of tricuspid stenosis is present. Aortic Valve: The aortic valve is normal in structure. Aortic valve regurgitation is trivial by color flow Doppler. There is no stenosis of the aortic valve. Pulmonic Valve: The pulmonic valve was normal in structure. Pulmonic valve regurgitation is trivial by color flow Doppler. Aorta:  The ascending aorta is normal in size and structure. The aortic arch was not well visualized. There is evidence of plaque in the ascending aorta; Grade I, measuring 1-62m in size. Balloon pump noted in descending aorta.  JDuane BostonMD Electronically signed by JDuane BostonMD Signature Date/Time: 03/12/2022/2:57:19 PM    Final    DG Chest Port 1 View  Result Date: 03/12/2022 CLINICAL DATA:  Status post coronary bypass graft. EXAM: PORTABLE CHEST 1 VIEW COMPARISON:  March 11, 2022. FINDINGS: Stable cardiomediastinal silhouette. Endotracheal and nasogastric tubes appear to be in grossly good position. Swan-Ganz catheter is noted with tip in expected position of main pulmonary artery. Mild bibasilar subsegmental atelectasis is noted with small left pleural effusion. Left-sided chest tube is noted without definite pneumothorax. Bony thorax is unremarkable. IMPRESSION: Endotracheal and nasogastric tubes are in grossly good position. Left-sided chest tube is noted without definite pneumothorax. Mild bibasilar subsegmental atelectasis is noted with small left pleural effusion. Electronically Signed   By: JMarijo ConceptionM.D.   On: 03/12/2022 14:45     Medications:     Scheduled Medications:  acetaminophen  1,000 mg Oral Q6H   Or   acetaminophen (TYLENOL) oral liquid 160 mg/5 mL  1,000 mg Per Tube Q6H   aspirin EC  325 mg Oral Daily   Or   aspirin  324 mg Per Tube Daily   atorvastatin  80 mg Oral Daily   bisacodyl  10 mg Oral Daily   Or   bisacodyl  10 mg Rectal Daily    Chlorhexidine Gluconate Cloth  6 each Topical Daily   docusate sodium  200 mg Oral Daily   metoprolol tartrate  12.5 mg Oral BID   Or   metoprolol tartrate  12.5 mg Per Tube BID   mouth rinse  15 mL Mouth Rinse Q2H   [START ON 03/14/2022] pantoprazole  40 mg Oral Daily   sodium chloride flush  3 mL Intravenous Q12H    Infusions:  sodium chloride Stopped (03/12/22 2030)   sodium chloride     sodium chloride Stopped (03/12/22 2101)   albumin human Stopped (03/12/22 2101)    ceFAZolin (ANCEF) IV Stopped (03/13/22 0603)   dexmedetomidine (PRECEDEX) IV infusion Stopped (03/13/22 00109   DOBUTamine 5 mcg/kg/min (03/13/22 0700)   epinephrine 1.5 mcg/min (03/13/22 0700)   insulin 4.2 Units/hr (03/13/22 0700)   lactated ringers     lactated ringers Stopped (03/12/22 2225)   lactated ringers 20 mL/hr at 03/13/22 0700   milrinone 0.375 mcg/kg/min (03/13/22 0700)   nitroGLYCERIN     nitroPRUSSide     norepinephrine (LEVOPHED) Adult infusion Stopped (03/13/22 0512)   phenylephrine (NEO-SYNEPHRINE) Adult infusion     vasopressin Stopped (03/13/22 0206)    PRN Medications: sodium chloride, albumin human, dextrose, lactated ringers, metoprolol tartrate, midazolam, morphine injection, mouth rinse, oxyCODONE, prochlorperazine, sodium chloride flush, traMADol    Patient Profile   67y/o male w/ h/o submassive PE in March 2023 w/ subsequent RV dysfunction that recovered post treatment with catheter directed thrombolysis, on Xarelto, admitted w/ large acute anterolateral STEMI c/b acute systolic heart failure/shock requiring placement of IABP. Now s/p CABG. AHF team consulted to assist w/ management of HF. Also COVID +   Assessment/Plan   1. Cardiogenic shock/ischemic cardiomyopathy: Echo pre-op with LV EF 25-30%, wall motion abnormalities, normal RV.  IABP placed pre-op for management of angina.  Intra-op, patient developed acute worsening of LV and RV function as well as acute rise in PUtah  pressure (PASBP 80s-90s).  Difficult to wean off bypass, but eventually successful wean with iNO, inotropes, pressors.  Currently on IABP 1:1, iNO 30, Epi down to 1.5, milrinone 0.375. Now off NE and VP w/ stable MAPs. Swan removed overnight as outlined above.  - Check Co-ox off introducer and set up CVP monitoring  - needs replacement of swan to help wean iNO - begin gentle diuresis today  - Continue milrinone and dobutamine without change.  - Continue IABP 1:1.  Will need eventual anticoagulation when stable for this per surgery.   2. CAD: Anterolateral MI with cath showing 80% ostial LAD stenosis and 80% ostial large ramus stenosis.  Ostial lesions in close proximity made PCI very difficult and CABG was elected.  Patient underwent LIMA-LAD and SVG-ramus  - ASA - Continue statin.  3. H/o PE: Submassive PE in 3/23, treated with catheter-directed thrombolysis.   - Resume anticoagulation when stable from surgical standpoint.  4. COVID-19+: Incidental finding at admission. Not on antivirals.  5. Acute hypoxemic respiratory failure: Intubated post CABG.   - begin diuresis today  6. Thrombocytopenia: Platelets 57K, suspect post-op though IABP may play role.   Length of Stay: [address] PA-C  03/13/2022, 7:09 AM  Advanced Heart Failure Team Pager [phone] (M-F; 7a - 5p)  Please contact [facility] for night-coverage after hours (5p -7a ) and weekends on [email or web address]  Patient seen with PA, agree with the above note.    Swan removed due to malfunction last night. Co-ox 61% this morning. Last PA pressure before Luiz Blare was lost was not elevated. He remains on iNO 30, dobutamine 2.5, epinephrine 1.5, milrinone 0.375. IABP at 1:1, functioning normally.  Creatinine stable 1.36.   Patient is awake on vent.   General: NAD, vent Neck: JVP 8-9 cm, no thyromegaly or thyroid nodule.  Lungs: Clear to auscultation bilaterally with normal respiratory effort. CV: Nondisplaced PMI.  Heart regular  S1/S2, no S3/S4, soft friction rub.  No peripheral edema.   Abdomen: Soft, nontender, no hepatosplenomegaly, no distention.  Skin: Intact without lesions or rashes.  Neurologic: Awake/alert Extremities: No clubbing or cyanosis.  HEENT: Normal.   Stable this morning on current support, dobutamine decreased to 2.5. Good co-ox 61% this morning.  - Continue IABP, tip looks a bit low on CXR but same as prior.  Would wean when he is off epinephrine and dobutamine, on milrinone alone. Anticoagulation when stable for this per surgery.  - Replace Luiz Blare, can then begin to wean iNO (currently at 30 ppm) guided by PA pressure.  - Weight up 26 lbs, will give Lasix 40 mg IV x 1 and follow response.   H/o submassive PE, restart anticoagulation when stable for this per surgery.   CRITICAL CARE Performed by: Loralie Champagne  Total critical care time: 40 minutes  Critical care time was exclusive of separately billable procedures and treating other patients.  Critical care was necessary to treat or prevent imminent or life-threatening deterioration.  Critical care was time spent personally by me on the following activities: development of treatment plan with patient and/or surrogate as well as nursing, discussions with consultants, evaluation of patient's response to treatment, examination of patient, obtaining history from patient or surrogate, ordering and performing treatments and interventions, ordering and review of laboratory studies, ordering and review of radiographic studies, pulse oximetry and re-evaluation of patient's condition.  Loralie Champagne 03/13/2022 8:20 AM

## 2022-03-13 NOTE — Plan of Care (Signed)
  Problem: Education: Goal: Knowledge of General Education information will improve Description: Including pain rating scale, medication(s)/side effects and non-pharmacologic comfort measures Outcome: Progressing   Problem: Health Behavior/Discharge Planning: Goal: Ability to manage health-related needs will improve Outcome: Progressing   Problem: Clinical Measurements: Goal: Ability to maintain clinical measurements within normal limits will improve Outcome: Progressing Goal: Will remain free from infection Outcome: Progressing Goal: Diagnostic test results will improve Outcome: Progressing Goal: Respiratory complications will improve Outcome: Progressing Goal: Cardiovascular complication will be avoided Outcome: Progressing   Problem: Activity: Goal: Risk for activity intolerance will decrease Outcome: Progressing   Problem: Nutrition: Goal: Adequate nutrition will be maintained Outcome: Progressing   Problem: Coping: Goal: Level of anxiety will decrease Outcome: Progressing   Problem: Elimination: Goal: Will not experience complications related to bowel motility Outcome: Progressing Goal: Will not experience complications related to urinary retention Outcome: Progressing   Problem: Pain Managment: Goal: General experience of comfort will improve Outcome: Progressing   Problem: Safety: Goal: Ability to remain free from injury will improve Outcome: Progressing   Problem: Skin Integrity: Goal: Risk for impaired skin integrity will decrease Outcome: Progressing   Problem: Education: Goal: Understanding of CV disease, CV risk reduction, and recovery process will improve Outcome: Progressing Goal: Individualized Educational Video(s) Outcome: Progressing   Problem: Activity: Goal: Ability to return to baseline activity level will improve Outcome: Progressing   Problem: Cardiovascular: Goal: Ability to achieve and maintain adequate cardiovascular perfusion  will improve Outcome: Progressing Goal: Vascular access site(s) Level 0-1 will be maintained Outcome: Progressing   Problem: Health Behavior/Discharge Planning: Goal: Ability to safely manage health-related needs after discharge will improve Outcome: Progressing   Problem: Cardiac: Goal: Ability to achieve and maintain adequate cardiopulmonary perfusion will improve Outcome: Progressing Goal: Vascular access site(s) Level 0-1 will be maintained Outcome: Progressing   Problem: Fluid Volume: Goal: Ability to achieve a balanced intake and output will improve Outcome: Progressing   Problem: Physical Regulation: Goal: Complications related to the disease process, condition or treatment will be avoided or minimized Outcome: Progressing   Problem: Respiratory: Goal: Will regain and/or maintain adequate ventilation Outcome: Progressing   Problem: Education: Goal: Will demonstrate proper wound care and an understanding of methods to prevent future damage Outcome: Progressing Goal: Knowledge of disease or condition will improve Outcome: Progressing Goal: Knowledge of the prescribed therapeutic regimen will improve Outcome: Progressing Goal: Individualized Educational Video(s) Outcome: Progressing   Problem: Activity: Goal: Risk for activity intolerance will decrease Outcome: Progressing   Problem: Cardiac: Goal: Will achieve and/or maintain hemodynamic stability Outcome: Progressing   Problem: Clinical Measurements: Goal: Postoperative complications will be avoided or minimized Outcome: Progressing   Problem: Respiratory: Goal: Respiratory status will improve Outcome: Progressing   Problem: Skin Integrity: Goal: Wound healing without signs and symptoms of infection Outcome: Progressing Goal: Risk for impaired skin integrity will decrease Outcome: Progressing   Problem: Urinary Elimination: Goal: Ability to achieve and maintain adequate renal perfusion and functioning  will improve Outcome: Progressing

## 2022-03-13 NOTE — Progress Notes (Signed)
Patient ID: Juan Snow, male   DOB: 1956-02-26, 67 y.o.   MRN: [id number]  TCTS Evening Rounds:  Hemodynamically stable on milrinone 0.375, dobut 2.5, epi 2  Remains on vent 60% FiO2 per CCM.  Good UO  Chest tube output low.  BMET pending this pm.

## 2022-03-13 NOTE — Progress Notes (Signed)
[address]       [place] [address]             [phone]                 1 Day Post-Op Procedure(s) (LRB): CORONARY ARTERY BYPASS GRAFTING (CABG) X TWO BYPASSES USING LEFT INTERNAL MAMMARY ARTERY AND RIGHT GREATER SAPHENOUS  VEIN HARVEST. (N/A) TRANSESOPHAGEAL ECHOCARDIOGRAM (TEE) (N/A)   Events: No events overnight  _______________________________________________________________ Vitals: BP 111/65 (BP Location: Right Arm)   Pulse 73   Temp 99.1 F (37.3 C)   Resp (!) 23   Ht '5\' 7"'$  (1.702 m)   Wt 98.9 kg   SpO2 100%   BMI 34.15 kg/m  Filed Weights   03/11/22 0303 03/12/22 0600 03/13/22 0500  Weight: 93.2 kg 87.4 kg 98.9 kg     - Neuro: arousable, follows command  - Cardiovascular: sinus  Drips: milr 0.375, epi 1.5, dob 5 IABP 1:1 iNO 30ppm.   PAP: (12-48)/(9-31) 21/18 CVP:  [8 mmHg-19 mmHg] 8 mmHg CO:  [3.8 L/min-4.6 L/min] 4.6 L/min CI:  [1.9 L/min/m2-2.3 L/min/m2] 2.3 L/min/m2  - Pulm:  Vent Mode: PRVC FiO2 (%):  [60 %-100 %] 60 % Set Rate:  [15 bmp-26 bmp] 26 bmp Vt Set:  [530 mL] 530 mL PEEP:  [8 cmH20] 8 cmH20 Plateau Pressure:  [16 cmH20-22 cmH20] 16 cmH20  ABG    Component Value Date/Time   PHART 7.357 03/12/2022 1724   PCO2ART 34.4 03/12/2022 1724   PO2ART 164 (H) 03/12/2022 1724   HCO3 19.4 (L) 03/12/2022 1724   TCO2 20 (L) 03/12/2022 1724   ACIDBASEDEF 6.0 (H) 03/12/2022 1724   O2SAT 61.3 03/13/2022 0720    - Abd: ND - Extremity: warm  .Intake/Output      01/09 0701 01/10 0700 01/10 0701 01/11 0700   P.O.     I.V. (mL/kg) 5337.2 (54) 45.3 (0.5)   Blood 189    NG/GT 250    IV Piggyback 1685.6    Total Intake(mL/kg) 7461.8 (75.4) 45.3 (0.5)   Urine (mL/kg/hr) 1027 (0.4)    Blood 264    Chest Tube 514    Total Output 1805    Net +5656.8 +45.3           _______________________________________________________________ Labs:    Latest Ref Rng & Units 03/13/2022    4:30 AM 03/12/2022    8:00 PM  03/12/2022    5:24 PM  CBC  WBC 4.0 - 10.5 K/uL 9.8  11.0    Hemoglobin 13.0 - 17.0 g/dL 11.0  11.5  10.5   Hematocrit 39.0 - 52.0 % 30.7  32.5  31.0   Platelets 150 - 400 K/uL 57  64        Latest Ref Rng & Units 03/13/2022    4:30 AM 03/12/2022    8:00 PM 03/12/2022    5:24 PM  CMP  Glucose 70 - 99 mg/dL 99  198    BUN 8 - 23 mg/dL 16  16    Creatinine 0.61 - 1.24 mg/dL 1.36  1.54    Sodium 135 - 145 mmol/L 133  134  135   Potassium 3.5 - 5.1 mmol/L 4.2  4.2  4.1   Chloride 98 - 111 mmol/L 104  103    CO2 22 - 32 mmol/L 23  20    Calcium 8.9 - 10.3 mg/dL 7.8  8.1      CXR: PV congestion  _______________________________________________________________  Assessment and Plan: POD 1 s/p CABG  Neuro: mentating well.  On prn sedation CV: will replace swan for weaning of iNO.  Consider decrease milr as this may help with bp.  Will keep IABP for now Pulm: intubated for IABP and iNO Renal: creat trending down GI: NPO.   Heme: stable ID: [email or web address] Endo: SSI Dispo: continue ICU care   Beverlie Kurihara O Glenford Garis 03/13/2022 8:09 AM

## 2022-03-14 DIAGNOSIS — I2102 ST elevation (STEMI) myocardial infarction involving left anterior descending coronary artery: Secondary | ICD-10-CM | POA: Diagnosis not present

## 2022-03-14 DIAGNOSIS — R570 Cardiogenic shock: Secondary | ICD-10-CM | POA: Diagnosis not present

## 2022-03-14 DIAGNOSIS — J90 Pleural effusion, not elsewhere classified: Secondary | ICD-10-CM | POA: Diagnosis not present

## 2022-03-14 LAB — BASIC METABOLIC PANEL
Anion gap: 9 (ref 5–15)
BUN: 18 mg/dL (ref 8–23)
CO2: 22 mmol/L (ref 22–32)
Calcium: 7.7 mg/dL — ABNORMAL LOW (ref 8.9–10.3)
Chloride: 102 mmol/L (ref 98–111)
Creatinine, Ser: 1.33 mg/dL — ABNORMAL HIGH (ref 0.61–1.24)
GFR, Estimated: 59 mL/min — ABNORMAL LOW (ref >60)
Glucose, Bld: 100 mg/dL — ABNORMAL HIGH (ref 70–99)
Potassium: 4.4 mmol/L (ref 3.5–5.1)
Sodium: 133 mmol/L — ABNORMAL LOW (ref 135–145)

## 2022-03-14 LAB — GLUCOSE, CAPILLARY
Glucose-Capillary: 110 mg/dL — ABNORMAL HIGH (ref 70–99)
Glucose-Capillary: 112 mg/dL — ABNORMAL HIGH (ref 70–99)
Glucose-Capillary: 113 mg/dL — ABNORMAL HIGH (ref 70–99)
Glucose-Capillary: 115 mg/dL — ABNORMAL HIGH (ref 70–99)
Glucose-Capillary: 118 mg/dL — ABNORMAL HIGH (ref 70–99)
Glucose-Capillary: 120 mg/dL — ABNORMAL HIGH (ref 70–99)
Glucose-Capillary: 120 mg/dL — ABNORMAL HIGH (ref 70–99)
Glucose-Capillary: 125 mg/dL — ABNORMAL HIGH (ref 70–99)
Glucose-Capillary: 135 mg/dL — ABNORMAL HIGH (ref 70–99)
Glucose-Capillary: 143 mg/dL — ABNORMAL HIGH (ref 70–99)
Glucose-Capillary: 145 mg/dL — ABNORMAL HIGH (ref 70–99)
Glucose-Capillary: 150 mg/dL — ABNORMAL HIGH (ref 70–99)
Glucose-Capillary: 158 mg/dL — ABNORMAL HIGH (ref 70–99)

## 2022-03-14 LAB — COOXEMETRY PANEL
Carboxyhemoglobin: 1 % (ref 0.5–1.5)
Carboxyhemoglobin: 1.2 % (ref 0.5–1.5)
Methemoglobin: <0.7 % (ref 0.0–1.5)
Methemoglobin: <0.7 % (ref 0.0–1.5)
O2 Saturation: 46.6 %
O2 Saturation: 69.6 %
Total hemoglobin: 10.7 g/dL — ABNORMAL LOW (ref 12.0–16.0)
Total hemoglobin: 10.3 g/dL — ABNORMAL LOW (ref 12.0–16.0)

## 2022-03-14 LAB — CBC
HCT: 30 % — ABNORMAL LOW (ref 39.0–52.0)
Hemoglobin: 10.2 g/dL — ABNORMAL LOW (ref 13.0–17.0)
MCH: 30.6 pg (ref 26.0–34.0)
MCHC: 34 g/dL (ref 30.0–36.0)
MCV: 90.1 fL (ref 80.0–100.0)
Platelets: 61 10*3/uL — ABNORMAL LOW (ref 150–400)
RBC: 3.33 MIL/uL — ABNORMAL LOW (ref 4.22–5.81)
RDW: 13.9 % (ref 11.5–15.5)
WBC: 9 10*3/uL (ref 4.0–10.5)
nRBC: 0 % (ref 0.0–0.2)

## 2022-03-14 LAB — MAGNESIUM: Magnesium: 2.2 mg/dL (ref 1.7–2.4)

## 2022-03-14 MED ORDER — FUROSEMIDE 10 MG/ML IJ SOLN
10.0000 mg/h | INTRAVENOUS | Status: DC
  Administered 2022-03-14 – 2022-03-15 (×2): 8 mg/h via INTRAVENOUS
  Administered 2022-03-16: 6 mg/h via INTRAVENOUS
  Administered 2022-03-18 – 2022-03-20 (×3): 10 mg/h via INTRAVENOUS
  Filled 2022-03-14 (×7): qty 20

## 2022-03-14 MED ORDER — SALINE SPRAY 0.65 % NA SOLN
1.0000 | NASAL | Status: DC | PRN
  Filled 2022-03-14: qty 44

## 2022-03-14 MED ORDER — DOCUSATE SODIUM 50 MG/5ML PO LIQD
200.0000 mg | Freq: Every day | ORAL | Status: DC
  Administered 2022-03-14: 200 mg
  Filled 2022-03-14: qty 20

## 2022-03-14 MED ORDER — INSULIN DETEMIR 100 UNIT/ML ~~LOC~~ SOLN
15.0000 [IU] | Freq: Two times a day (BID) | SUBCUTANEOUS | Status: DC
  Administered 2022-03-14 – 2022-03-20 (×14): 15 [IU] via SUBCUTANEOUS
  Filled 2022-03-14 (×16): qty 0.15

## 2022-03-14 MED ORDER — FAMOTIDINE 20 MG PO TABS
20.0000 mg | ORAL_TABLET | Freq: Every day | ORAL | Status: DC
  Administered 2022-03-14 – 2022-03-15 (×2): 20 mg
  Filled 2022-03-14 (×2): qty 1

## 2022-03-14 MED ORDER — FUROSEMIDE 10 MG/ML IJ SOLN
40.0000 mg | Freq: Once | INTRAMUSCULAR | Status: AC
  Administered 2022-03-14: 40 mg via INTRAVENOUS
  Filled 2022-03-14: qty 4

## 2022-03-14 MED ORDER — ORAL CARE MOUTH RINSE: 15.0000 mL | OROMUCOSAL | Status: DC | PRN

## 2022-03-14 MED ORDER — INSULIN ASPART 100 UNIT/ML IJ SOLN
3.0000 [IU] | INTRAMUSCULAR | Status: DC
  Administered 2022-03-14 – 2022-03-21 (×18): 3 [IU] via SUBCUTANEOUS
  Administered 2022-03-21: 6 [IU] via SUBCUTANEOUS
  Administered 2022-03-21 – 2022-03-25 (×10): 3 [IU] via SUBCUTANEOUS

## 2022-03-14 NOTE — Progress Notes (Signed)
NAME:  Juan Snow, MRN:  [id number], DOB:  [birth year], LOS: 4 ADMISSION DATE:  03/10/2022, CONSULTATION DATE:  03/12/22 REFERRING MD:  Kipp Brood, CHIEF COMPLAINT:  CABG   History of Present Illness:  67 year old man presented with chest pain on 03/10/22 found to have STEMI and severe 2V CAD, IABP placed with improvement in anginal symptoms and he was seen by cardiothoracic surgery.  Underwent CABG x 2 today (LIMA-LAD, SVG-ramus) with intra-op course complicated by difficulty weaning off bypass due to high PA pressures and low LV function; with iNO and high dose inotropes+pressors, able to be eventually weaned off pump.  Brought to ICU on pressors, inotropes, iNO, IABP, vent.  PCCM consulted to assist with management.   Pertinent  Medical History  GERD Prior VTE on [facility] PTA Arthtritis  Significant Hospital Events: Including procedures, antibiotic start and stop dates in addition to other pertinent events   1/7 admit 1/9 CABG x 2, saphenous graft harvest 1/10 weaning inotropic and nitric oxide support  Interim History / Subjective:  On mechanical ventilation, denies chest pain.  Weaning nitric oxide  Objective   Blood pressure (!) 138/94, pulse (!) 246, temperature 99.5 F (37.5 C), resp. rate 19, height '5\' 7"'$  (1.702 m), weight 98.5 kg, SpO2 98 %. PAP: (10-58)/(-2-24) 33/15 CVP:  [4 mmHg-24 mmHg] 5 mmHg CO:  [3.4 L/min-4.7 L/min] 3.4 L/min CI:  [1.7 L/min/m2-2.4 L/min/m2] 1.7 L/min/m2  Vent Mode: PSV;CPAP FiO2 (%):  [40 %-60 %] 40 % Set Rate:  [26 bmp] 26 bmp Vt Set:  [530 mL] 530 mL PEEP:  [5 cmH20-8 cmH20] 5 cmH20 Pressure Support:  [8 cmH20] 8 cmH20 Plateau Pressure:  [19 cmH20] 19 cmH20   Intake/Output Summary (Last 24 hours) at 03/14/2022 1345 Last data filed at 03/14/2022 1300 Gross per 24 hour  Intake 1700.94 ml  Output 4460 ml  Net -2759.06 ml    Filed Weights   03/12/22 0600 03/13/22 0500 03/14/22 0500  Weight: 87.4 kg 98.9 kg 98.5 kg    Examination: General:  Well-nourished man in no distress HEENT: OG tube NG tube in place Chest: Clear to auscultation bilaterally.  Tolerating pressure support. Cardiac: Midline sternotomy incision heart sounds normal with no rub.  Extremities warm well-perfused.  Intracardiac balloon pump at one-to-one. Abdomen soft nontender Extremities IV sites intact good distal perfusion GU: Foley catheter in place with good urine output.  ancillary tests personally reviewed  SCV O2 69% Creatinine stable at 1.33 Platelet count appears to be rebounding at 61 Assessment & Plan:  Postoperative ventilator management Post cardiotomy shock state STEMI s/p 2V CABG Likely baseline CKD GERD Hx PE on AC PTA Thrombocytopenia question IABP shearing/hemolysis  -Wean nitric oxide to off. -If tolerates SBT for 2 hours or more and has acceptable lung mechanics will consider extubating -Marginal cardiac index this morning plan to continue IABP support - Pressors for MAP 70-80 - Sedation titrated to vent compliance, comfortable on minimal sedation - VAP prevention bundle - Resume AC when deemed safe by TCTS  Best Practice (right click and "Reselect all SmartList Selections" daily)   Diet/type: NPO tube feeds DVT prophylaxis: not indicated GI prophylaxis: PPI Lines: Central line Foley:  Yes, and it is still needed Code Status:  full code Last date of multidisciplinary goals of care discussion [per primary]  CRITICAL CARE Performed by: Kipp Brood   Total critical care time: 40 minutes  Critical care time was exclusive of separately billable procedures and treating other patients.  Critical care  was necessary to treat or prevent imminent or life-threatening deterioration.  Critical care was time spent personally by me on the following activities: development of treatment plan with patient and/or surrogate as well as nursing, discussions with consultants, evaluation of patient's response to treatment, examination of  patient, obtaining history from patient or surrogate, ordering and performing treatments and interventions, ordering and review of laboratory studies, ordering and review of radiographic studies, pulse oximetry, re-evaluation of patient's condition and participation in multidisciplinary rounds.  Kipp Brood, MD [facility] ICU Physician [place]  Pager: [phone] Mobile: [phone] After hours: [phone].

## 2022-03-14 NOTE — Progress Notes (Signed)
[address]       [place] [address]             [phone]                 2 Days Post-Op Procedure(s) (LRB): CORONARY ARTERY BYPASS GRAFTING (CABG) X TWO BYPASSES USING LEFT INTERNAL MAMMARY ARTERY AND RIGHT GREATER SAPHENOUS  VEIN HARVEST. (N/A) TRANSESOPHAGEAL ECHOCARDIOGRAM (TEE) (N/A)   Events: No events overnight  _______________________________________________________________ Vitals: BP (!) 138/94   Pulse 75   Temp 99.7 F (37.6 C)   Resp (!) 26   Ht '5\' 7"'$  (1.702 m)   Wt 98.5 kg   SpO2 98%   BMI 34.01 kg/m  Filed Weights   03/12/22 0600 03/13/22 0500 03/14/22 0500  Weight: 87.4 kg 98.9 kg 98.5 kg     - Neuro: arousable, follows command  - Cardiovascular: sinus  Drips: milr 0.375, dob 5 IABP 1:1 iNO 9ppm.   PAP: (10-58)/(-2-26) 32/18 CVP:  [4 mmHg-24 mmHg] 11 mmHg CO:  [4.2 L/min-4.7 L/min] 4.3 L/min CI:  [2.1 L/min/m2-2.4 L/min/m2] 2.1 L/min/m2  - Pulm:  Vent Mode: PRVC FiO2 (%):  [45 %-60 %] 45 % Set Rate:  [26 bmp] 26 bmp Vt Set:  [530 mL] 530 mL PEEP:  [8 cmH20] 8 cmH20 Plateau Pressure:  [11 cmH20-19 cmH20] 19 cmH20  ABG    Component Value Date/Time   PHART 7.357 03/12/2022 1724   PCO2ART 34.4 03/12/2022 1724   PO2ART 164 (H) 03/12/2022 1724   HCO3 19.4 (L) 03/12/2022 1724   TCO2 20 (L) 03/12/2022 1724   ACIDBASEDEF 6.0 (H) 03/12/2022 1724   O2SAT 46.6 03/14/2022 0615    - Abd: ND - Extremity: warm  .Intake/Output      01/10 0701 01/11 0700 01/11 0701 01/12 0700   I.V. (mL/kg) 1029.9 (10.5)    Blood     NG/GT 250    IV Piggyback 350.1    Total Intake(mL/kg) 1630 (16.5)    Urine (mL/kg/hr) 1670 (0.7)    Blood     Chest Tube 160    Total Output 1830    Net -200.1            _______________________________________________________________ Labs:    Latest Ref Rng & Units 03/14/2022    3:08 AM 03/13/2022    5:32 PM 03/13/2022    4:30 AM  CBC  WBC 4.0 - 10.5 K/uL 9.0  9.8  9.8   Hemoglobin 13.0 -  17.0 g/dL 10.2  10.3  11.0   Hematocrit 39.0 - 52.0 % 30.0  28.9  30.7   Platelets 150 - 400 K/uL 61  57  57       Latest Ref Rng & Units 03/14/2022    3:08 AM 03/13/2022    5:32 PM 03/13/2022    4:30 AM  CMP  Glucose 70 - 99 mg/dL 100  88  99   BUN 8 - 23 mg/dL '18  17  16   '$ Creatinine 0.61 - 1.24 mg/dL 1.33  1.42  1.36   Sodium 135 - 145 mmol/L 133  134  133   Potassium 3.5 - 5.1 mmol/L 4.4  4.0  4.2   Chloride 98 - 111 mmol/L 102  101  104   CO2 22 - 32 mmol/L '22  24  23   '$ Calcium 8.9 - 10.3 mg/dL 7.7  7.7  7.8     CXR: PV congestion  _______________________________________________________________  Assessment and Plan:  POD 2 s/p CABG  Neuro: mentating well.  On prn sedation CV: will replace swan for weaning of iNO.  Hopefully can remove IABP today.   Pulm: intubated for IABP and iNO Renal: creat stable.  Will start lasix gtt GI: consider tube feeds if not extubated today   Heme: stable ID: [email or web address] Endo: SSI Dispo: continue ICU care   Lajuana Matte 03/14/2022 7:16 AM

## 2022-03-14 NOTE — Progress Notes (Signed)
Patient ID: Juan Snow, male   DOB: 12-19-1955, 67 y.o.   MRN: [id number]     Advanced Heart Failure Rounding Note  PCP-Cardiologist: None   Subjective:    S/p CABG x 2   POD #2   Stable overnight, awake on vent.  FiO2 0.45  iNO weaned to 9. Off pressors with stable MAP. Remains on DBA 2.5, Milrinone 0.375. IABP at 1:1.    Rhythm stable, a-paced  I/Os even with one dose of Lasix 40 mg IV.  Creatinine stable 1.33.   Platelets 57K => 61K.  Swan numbers: CVP 16-17 PA 36/20 CI 1.7 Co-ox 47%   Objective:   Weight Range: 98.5 kg Body mass index is 34.01 kg/m.   Vital Signs:   Temp:  [80.6 F (27 C)-100 F (37.8 C)] 99.7 F (37.6 C) (01/11 0700) Pulse Rate:  [66-218] 75 (01/11 0700) Resp:  [13-33] 26 (01/11 0700) BP: (96-138)/(51-94) 138/94 (01/11 0601) SpO2:  [93 %-100 %] 98 % (01/11 0700) Arterial Line BP: (91-170)/(48-72) 109/50 (01/11 0700) FiO2 (%):  [45 %-60 %] 45 % (01/11 0400) Weight:  [98.5 kg] 98.5 kg (01/11 0500) Last BM Date : 03/10/22  Weight change: Filed Weights   03/12/22 0600 03/13/22 0500 03/14/22 0500  Weight: 87.4 kg 98.9 kg 98.5 kg    Intake/Output:   Intake/Output Summary (Last 24 hours) at 03/14/2022 0746 Last data filed at 03/14/2022 0700 Gross per 24 hour  Intake 1629.95 ml  Output 1830 ml  Net -200.05 ml      Physical Exam    General: Awake on vent Neck: JVP 16 cm, no thyromegaly or thyroid nodule.  Lungs: Clear to auscultation bilaterally with normal respiratory effort. CV: Nondisplaced PMI.  Heart regular S1/S2, no S3/S4, friction rub noted.  No peripheral edema.   Abdomen: Soft, nontender, no hepatosplenomegaly, no distention.  Skin: Intact without lesions or rashes.  Neurologic: Awake, follows commands.  Extremities: No clubbing or cyanosis.  HEENT: Normal.    Telemetry   A-paced 80s (personally reviewed)  Labs    CBC Recent Labs    03/13/22 1732 03/14/22 0308  WBC 9.8 9.0  HGB 10.3* 10.2*  HCT 28.9*  30.0*  MCV 88.1 90.1  PLT 57* 61*   Basic Metabolic Panel Recent Labs    03/13/22 1732 03/14/22 0308  NA 134* 133*  K 4.0 4.4  CL 101 102  CO2 24 22  GLUCOSE 88 100*  BUN 17 18  CREATININE 1.42* 1.33*  CALCIUM 7.7* 7.7*  MG 2.4 2.2   Liver Function Tests No results for input(s): "AST", "ALT", "ALKPHOS", "BILITOT", "PROT", "ALBUMIN" in the last 72 hours.  No results for input(s): "LIPASE", "AMYLASE" in the last 72 hours. Cardiac Enzymes No results for input(s): "CKTOTAL", "CKMB", "CKMBINDEX", "TROPONINI" in the last 72 hours.  BNP: BNP (last 3 results) Recent Labs    05/11/21 1052  BNP 23.1    ProBNP (last 3 results) No results for input(s): "PROBNP" in the last 8760 hours.   D-Dimer No results for input(s): "DDIMER" in the last 72 hours. Hemoglobin A1C No results for input(s): "HGBA1C" in the last 72 hours.  Fasting Lipid Panel No results for input(s): "CHOL", "HDL", "LDLCALC", "TRIG", "CHOLHDL", "LDLDIRECT" in the last 72 hours.  Thyroid Function Tests No results for input(s): "TSH", "T4TOTAL", "T3FREE", "THYROIDAB" in the last 72 hours.  Invalid input(s): "FREET3"  Other results:   Imaging    DG CHEST PORT 1 VIEW  Result Date: 03/13/2022 CLINICAL DATA:  Status post central line placement EXAM: PORTABLE CHEST 1 VIEW COMPARISON:  Chest radiograph dated 03/13/2022 FINDINGS: Lines/tubes: Endotracheal tube tip projects 3.4 cm above the carina. Enteric tube tip reaches the diaphragm and terminates below the field of view. Interval placement of right IJ PA catheter, which appears looped in the right atrium with tip projecting over the pulmonary artery trunk. Mediastinal and bibasilar chest tubes are unchanged. Chest: Slightly increased lung aeration with right upper lung linear bibasilar patchy opacities. Pleura: Trace bilateral pleural effusions. Heart/mediastinum: Similar postsurgical cardiomediastinal silhouette. Bones: Median sternotomy wires are  nondisplaced. IMPRESSION: 1. Interval placement of right IJ PA catheter, which appears looped in the right atrium with tip projecting over the pulmonary artery trunk. 2. Slightly increased lung aeration with right upper lung and bibasilar atelectasis. 3. Trace bilateral pleural effusions. Electronically Signed   By: Darrin Nipper M.D.   On: 03/13/2022 11:10     Medications:     Scheduled Medications:  acetaminophen  1,000 mg Oral Q6H   Or   acetaminophen (TYLENOL) oral liquid 160 mg/5 mL  1,000 mg Per Tube Q6H   aspirin EC  325 mg Oral Daily   Or   aspirin  324 mg Per Tube Daily   atorvastatin  80 mg Oral Daily   bisacodyl  10 mg Oral Daily   Or   bisacodyl  10 mg Rectal Daily   Chlorhexidine Gluconate Cloth  6 each Topical Daily   docusate sodium  200 mg Oral Daily   fentaNYL (SUBLIMAZE) injection  100 mcg Intravenous Once   furosemide  40 mg Intravenous Once   mouth rinse  15 mL Mouth Rinse Q2H   pantoprazole  40 mg Oral Daily   sodium chloride flush  3 mL Intravenous Q12H    Infusions:  sodium chloride Stopped (03/12/22 2030)   sodium chloride     sodium chloride Stopped (03/12/22 2101)   dexmedetomidine (PRECEDEX) IV infusion 0.4 mcg/kg/hr (03/14/22 0700)   DOBUTamine 2.5 mcg/kg/min (03/14/22 0700)   furosemide (LASIX) 200 mg in dextrose 5 % 100 mL (2 mg/mL) infusion     insulin 4.4 Units/hr (03/14/22 0714)   lactated ringers     lactated ringers Stopped (03/12/22 2225)   lactated ringers 20 mL/hr at 03/14/22 0700   milrinone 0.375 mcg/kg/min (03/14/22 0700)   norepinephrine (LEVOPHED) Adult infusion      PRN Medications: sodium chloride, dextrose, lactated ringers, midazolam, morphine injection, mouth rinse, oxyCODONE, prochlorperazine, sodium chloride, sodium chloride flush, traMADol    Patient Profile   67 y/o male w/ h/o submassive PE in March 2023 w/ subsequent RV dysfunction that recovered post treatment with catheter directed thrombolysis, on Xarelto, admitted  w/ large acute anterolateral STEMI c/b acute systolic heart failure/shock requiring placement of IABP. Now s/p CABG. AHF team consulted to assist w/ management of HF. Also COVID +   Assessment/Plan   1. Cardiogenic shock/ischemic cardiomyopathy: Echo pre-op with LV EF 25-30%, wall motion abnormalities, normal RV.  IABP placed pre-op for management of angina.  Intra-op, patient developed acute worsening of LV and RV function as well as acute rise in PA pressure (PASBP 80s-90s).  Difficult to wean off bypass, but eventually successful wean with iNO, inotropes, pressors.  Currently on IABP 1:1, iNO weaned to 9, dobutamine 2.5, milrinone 0.375. Now off NE, epinephrine, and VP w/ stable MAPs. CVP elevated 16-17 with marginal cardiac output (CI 1.7 from [place] and co-ox 47%).    - I think we can wean NO to off.  -  Lasix 40 mg IV x 1 then start gtt 8 mg/hr.  - Would not wean milrinone and dobutamine.  - With output low this morning, would hold off on weaning IABP.  Will diurese and recheck numbers around noon.    2. CAD: Anterolateral MI with cath showing 80% ostial LAD stenosis and 80% ostial large ramus stenosis.  Ostial lesions in close proximity made PCI very difficult and CABG was elected.  Patient underwent LIMA-LAD and SVG-ramus  - ASA - Continue statin.  3. H/o PE: Submassive PE in 3/23, treated with catheter-directed thrombolysis.   - Resume anticoagulation when stable from surgical standpoint.  4. COVID-19+: Incidental finding at admission. Not on antivirals.  5. Acute hypoxemic respiratory failure: Intubated post CABG.   - Diuresis.  - Wean iNO 6. Thrombocytopenia: Platelets 57K => 61K, suspect post-op though IABP may play role.  7. Anemia: Post-op.   CRITICAL CARE Performed by: Loralie Champagne  Total critical care time: 40 minutes  Critical care time was exclusive of separately billable procedures and treating other patients.  Critical care was necessary to treat or prevent imminent or  life-threatening deterioration.  Critical care was time spent personally by me on the following activities: development of treatment plan with patient and/or surrogate as well as nursing, discussions with consultants, evaluation of patient's response to treatment, examination of patient, obtaining history from patient or surrogate, ordering and performing treatments and interventions, ordering and review of laboratory studies, ordering and review of radiographic studies, pulse oximetry and re-evaluation of patient's condition.  Loralie Champagne 03/14/2022 7:46 AM

## 2022-03-14 NOTE — Progress Notes (Signed)
iNO therapy wean from 15 to 10ppm. Patient Pulmonary Artery Pressure are stable 27/47mhg currently. No respiratory compromise noted. Patient tolerating it well.

## 2022-03-14 NOTE — Progress Notes (Signed)
      [address]       ,[place] [address]             [phone]      Resting comfortably   BP (!) 138/94   Pulse 82   Temp 99.5 F (37.5 C)   Resp (!) 26   Ht '5\' 7"'$  (1.702 m)   Wt 98.5 kg   SpO2 94%   BMI 34.01 kg/m  39/17 CI= 2.2 IABP 1:1, milrinone 0.375, dobutamine 2.5  Intake/Output Summary (Last 24 hours) at 03/14/2022 1711 Last data filed at 03/14/2022 1600 Gross per 24 hour  Intake 1503.66 ml  Output 4820 ml  Net -3316.34 ml   Extubated earlier today Diuresing well  Continue current Rx  Remo Lipps C. Roxan Hockey, MD [facility] [phone]

## 2022-03-14 NOTE — Progress Notes (Addendum)
RT weaned NO to 5.0ppm. Pt tolerating well at this time, Vitals stable, RN at bedside, MD aware, RT will monitor.     03/14/22 0743  INOMAX  NO2 (ppm) 0 ppm  NO (ppm) (S)  5 ppm  Oxygen (%) 40 %  Tank PSI 1750 PSI (tank 2 50)

## 2022-03-14 NOTE — Progress Notes (Addendum)
RT weaned NO to 4ppm per protocol. Pt tolerating well, vitals stable, RN aware, RT will monitor.    03/14/22 0854  INOMAX  NO2 (ppm) 0 ppm  NO (ppm) (S)  4 ppm  Oxygen (%) 40 %  Tank PSI 1750 PSI (tank 2 50)

## 2022-03-14 NOTE — Progress Notes (Signed)
iNO weaned by RRT from 15 to 10 ppm per order. Pt tolerating well. CVP 11, PAP 29/17.

## 2022-03-14 NOTE — Progress Notes (Addendum)
RT weaned NO to 2ppm per protocol. Pt tolerating well at this time, vitals stable, MD at bedside, RN aware, RT will monitor.      03/14/22 1052  INOMAX  NO2 (ppm) 0 ppm  NO (ppm) (S)  2 ppm  Oxygen (%) 36 %  Tank PSI 1750 PSI (tank 2 50)

## 2022-03-14 NOTE — Progress Notes (Signed)
Pt achieved a NIF of -30 and VC of 1.1L with good pt effort on all attempts. RN aware,MD notifed, pt tolerated well, RT will monitor.

## 2022-03-14 NOTE — Progress Notes (Addendum)
RT weaned NO to 1ppm per protocol. Pt tolerating well, vitals stable, RN aware, RT will monitor.     03/14/22 1212  INOMAX  $ INO Therapy Daily Yes  NO2 (ppm) 0 ppm  NO (ppm) (S)  1 ppm  Oxygen (%) 38 %  Tank PSI 1750 PSI (tank 2 50)

## 2022-03-14 NOTE — Progress Notes (Addendum)
RT weaned NO to 3ppm per protocol. Pt tolerating well at this, vitals stable, RN aware, RT will monito.      03/14/22 1006  INOMAX  NO2 (ppm) 0 ppm  NO (ppm) (S)  3 ppm  Oxygen (%) 38 %  Tank PSI 1750 PSI (tank 2 50)

## 2022-03-14 NOTE — Progress Notes (Signed)
RT wean Nitric oxide off (dose 0 ppm) per protocol. Pt tolerating well at this time, RN aware, vitals stable, RT will monitor.   03/14/22 1412  INOMAX  NO2 (ppm) 0 ppm  NO (ppm) (S)  0 ppm  Oxygen (%) 32 %  Tank PSI 1750 PSI (tank 2 50)

## 2022-03-14 NOTE — Procedures (Signed)
Extubation Procedure Note  Patient Details:   Name: Juan Snow DOB: 1955/11/24 MRN: [id number]   Airway Documentation:    Vent end date: 03/14/22 Vent end time: 1538   Evaluation  O2 sats: stable throughout Complications: No apparent complications Patient did tolerate procedure well. Bilateral Breath Sounds: Diminished, Rhonchi   Yes  Pt extubated to 2L , pt tolerating well at this time. Cuff leak present, no stridor noted, RN at bedside,RT will monitor.   Carren Rang 03/14/2022, 3:39 PM

## 2022-03-15 ENCOUNTER — Inpatient Hospital Stay (HOSPITAL_COMMUNITY): Payer: Medicare Other

## 2022-03-15 DIAGNOSIS — R570 Cardiogenic shock: Secondary | ICD-10-CM | POA: Diagnosis not present

## 2022-03-15 DIAGNOSIS — I2102 ST elevation (STEMI) myocardial infarction involving left anterior descending coronary artery: Secondary | ICD-10-CM | POA: Diagnosis not present

## 2022-03-15 DIAGNOSIS — J90 Pleural effusion, not elsewhere classified: Secondary | ICD-10-CM | POA: Diagnosis not present

## 2022-03-15 LAB — CBC
HCT: 30.8 % — ABNORMAL LOW (ref 39.0–52.0)
Hemoglobin: 10.7 g/dL — ABNORMAL LOW (ref 13.0–17.0)
MCH: 30.8 pg (ref 26.0–34.0)
MCHC: 34.7 g/dL (ref 30.0–36.0)
MCV: 88.8 fL (ref 80.0–100.0)
Platelets: 106 10*3/uL — ABNORMAL LOW (ref 150–400)
RBC: 3.47 MIL/uL — ABNORMAL LOW (ref 4.22–5.81)
RDW: 14 % (ref 11.5–15.5)
WBC: 8.9 10*3/uL (ref 4.0–10.5)
nRBC: 0 % (ref 0.0–0.2)

## 2022-03-15 LAB — BASIC METABOLIC PANEL
Anion gap: 13 (ref 5–15)
Anion gap: 14 (ref 5–15)
BUN: 19 mg/dL (ref 8–23)
BUN: 18 mg/dL (ref 8–23)
CO2: 27 mmol/L (ref 22–32)
CO2: 25 mmol/L (ref 22–32)
Calcium: 7.7 mg/dL — ABNORMAL LOW (ref 8.9–10.3)
Calcium: 7.8 mg/dL — ABNORMAL LOW (ref 8.9–10.3)
Chloride: 93 mmol/L — ABNORMAL LOW (ref 98–111)
Chloride: 91 mmol/L — ABNORMAL LOW (ref 98–111)
Creatinine, Ser: 1.49 mg/dL — ABNORMAL HIGH (ref 0.61–1.24)
Creatinine, Ser: 1.42 mg/dL — ABNORMAL HIGH (ref 0.61–1.24)
GFR, Estimated: 52 mL/min — ABNORMAL LOW (ref >60)
GFR, Estimated: 55 mL/min — ABNORMAL LOW (ref >60)
Glucose, Bld: 121 mg/dL — ABNORMAL HIGH (ref 70–99)
Glucose, Bld: 117 mg/dL — ABNORMAL HIGH (ref 70–99)
Potassium: 3.3 mmol/L — ABNORMAL LOW (ref 3.5–5.1)
Potassium: 3.6 mmol/L (ref 3.5–5.1)
Sodium: 133 mmol/L — ABNORMAL LOW (ref 135–145)
Sodium: 130 mmol/L — ABNORMAL LOW (ref 135–145)

## 2022-03-15 LAB — COOXEMETRY PANEL
Carboxyhemoglobin: 1.6 % — ABNORMAL HIGH (ref 0.5–1.5)
Carboxyhemoglobin: 1.5 % (ref 0.5–1.5)
Carboxyhemoglobin: 1.4 % (ref 0.5–1.5)
Carboxyhemoglobin: 1.6 % — ABNORMAL HIGH (ref 0.5–1.5)
Methemoglobin: <0.7 % (ref 0.0–1.5)
Methemoglobin: <0.7 % (ref 0.0–1.5)
Methemoglobin: <0.7 % (ref 0.0–1.5)
Methemoglobin: <0.7 % (ref 0.0–1.5)
O2 Saturation: 51 %
O2 Saturation: 54.7 %
O2 Saturation: 50.9 %
O2 Saturation: 52.8 %
Total hemoglobin: 10.8 g/dL — ABNORMAL LOW (ref 12.0–16.0)
Total hemoglobin: 11.3 g/dL — ABNORMAL LOW (ref 12.0–16.0)
Total hemoglobin: 11.3 g/dL — ABNORMAL LOW (ref 12.0–16.0)
Total hemoglobin: 11 g/dL — ABNORMAL LOW (ref 12.0–16.0)

## 2022-03-15 LAB — HEPARIN LEVEL (UNFRACTIONATED)
Heparin Unfractionated: <0.1 IU/mL — ABNORMAL LOW (ref 0.30–0.70)
Heparin Unfractionated: <0.1 IU/mL — ABNORMAL LOW (ref 0.30–0.70)

## 2022-03-15 LAB — GLUCOSE, CAPILLARY
Glucose-Capillary: 120 mg/dL — ABNORMAL HIGH (ref 70–99)
Glucose-Capillary: 120 mg/dL — ABNORMAL HIGH (ref 70–99)
Glucose-Capillary: 112 mg/dL — ABNORMAL HIGH (ref 70–99)
Glucose-Capillary: 113 mg/dL — ABNORMAL HIGH (ref 70–99)
Glucose-Capillary: 126 mg/dL — ABNORMAL HIGH (ref 70–99)
Glucose-Capillary: 132 mg/dL — ABNORMAL HIGH (ref 70–99)

## 2022-03-15 LAB — MAGNESIUM: Magnesium: 2 mg/dL (ref 1.7–2.4)

## 2022-03-15 MED ORDER — POTASSIUM CHLORIDE 10 MEQ/50ML IV SOLN
10.0000 meq | INTRAVENOUS | Status: AC
  Administered 2022-03-15 (×3): 10 meq via INTRAVENOUS

## 2022-03-15 MED ORDER — DOCUSATE SODIUM 100 MG PO CAPS
200.0000 mg | ORAL_CAPSULE | Freq: Every day | ORAL | Status: DC
  Administered 2022-03-16 – 2022-03-24 (×6): 200 mg via ORAL
  Filled 2022-03-15 (×8): qty 2

## 2022-03-15 MED ORDER — HEPARIN (PORCINE) 25000 UT/250ML-% IV SOLN
1200.0000 [IU]/h | INTRAVENOUS | Status: DC
  Administered 2022-03-15: 900 [IU]/h via INTRAVENOUS
  Administered 2022-03-16: 1200 [IU]/h via INTRAVENOUS
  Filled 2022-03-15 (×2): qty 250

## 2022-03-15 MED ORDER — POTASSIUM CHLORIDE CRYS ER 20 MEQ PO TBCR
20.0000 meq | EXTENDED_RELEASE_TABLET | ORAL | Status: AC
  Administered 2022-03-15 (×3): 20 meq via ORAL
  Filled 2022-03-15 (×3): qty 1

## 2022-03-15 MED ORDER — AMIODARONE HCL IN DEXTROSE 360-4.14 MG/200ML-% IV SOLN
30.0000 mg/h | INTRAVENOUS | Status: DC
  Administered 2022-03-15 – 2022-03-19 (×11): 30 mg/h via INTRAVENOUS
  Filled 2022-03-15 (×8): qty 200

## 2022-03-15 MED ORDER — POTASSIUM CHLORIDE CRYS ER 20 MEQ PO TBCR
40.0000 meq | EXTENDED_RELEASE_TABLET | Freq: Once | ORAL | Status: AC
  Administered 2022-03-15: 40 meq via ORAL
  Filled 2022-03-15: qty 2

## 2022-03-15 MED ORDER — DOBUTAMINE NICU 2 MG/ML IV INFUSION =/>1.5 KG (25 ML) - SIMPLE MED
4.0000 ug/kg/min | INTRAVENOUS | Status: DC
  Filled 2022-03-15 (×2): qty 25

## 2022-03-15 MED ORDER — DOBUTAMINE IN D5W 4-5 MG/ML-% IV SOLN
1.0000 ug/kg/min | INTRAVENOUS | Status: DC
  Administered 2022-03-17: 4 ug/kg/min via INTRAVENOUS
  Administered 2022-03-19 – 2022-03-23 (×4): 5 ug/kg/min via INTRAVENOUS
  Administered 2022-03-25: 1 ug/kg/min via INTRAVENOUS
  Filled 2022-03-15 (×6): qty 250

## 2022-03-15 NOTE — Progress Notes (Addendum)
Patient ID: Juan Snow, male   DOB: [birth year], 67 y.o.   MRN: [id number]     Advanced Heart Failure Rounding Note  PCP-Cardiologist: None   Subjective:    S/p CABG x 2   POD #3   Extubated yesterday.   Currently on 2.5 DBA + 0.25 Milrinone. IABP at 1:1.  [place] numbers: CVP 12 PA 31/22 CO 3.15 CI 1.6 Co-ox 55%  Brisk diuresis yesterday with lasix gtt at 8/hr, weight down.   Fever overnight with Tmax 100.8, AF this am. No leukocytosis.   Objective:   Weight Range: 90.7 kg Body mass index is 31.32 kg/m.   Vital Signs:   Temp:  [98.8 F (37.1 C)-100.8 F (38.2 C)] 98.8 F (37.1 C) (01/12 0700) Pulse Rate:  [80-267] 96 (01/12 0700) Resp:  [15-31] 21 (01/12 0700) BP: (119-139)/(62-84) 127/84 (01/12 0700) SpO2:  [90 %-99 %] 97 % (01/12 0700) Arterial Line BP: (99-139)/(37-65) 124/65 (01/12 0700) FiO2 (%):  [40 %] 40 % (01/11 1458) Weight:  [90.7 kg] 90.7 kg (01/12 0500) Last BM Date : 03/10/22  Weight change: Filed Weights   03/13/22 0500 03/14/22 0500 03/15/22 0500  Weight: 98.9 kg 98.5 kg 90.7 kg    Intake/Output:   Intake/Output Summary (Last 24 hours) at 03/15/2022 0751 Last data filed at 03/15/2022 0700 Gross per 24 hour  Intake 1243.8 ml  Output 6570 ml  Net -5326.2 ml      Physical Exam    General:  Sitting up in bed. No distress. HEENT: normal Neck: supple. JVP 12-14. R IJ swan. Carotids 2+ bilat; no bruits.  Cor: PMI nondisplaced. Regular rate & rhythm. No rubs, gallops or murmurs. + R fem IABP Lungs: bibasilar crackles,  Abdomen: soft, nontender, nondistended.  Extremities: no cyanosis, clubbing, rash, edema Neuro: alert & orientedx3, cranial nerves grossly intact. Affect pleasant.  Telemetry   SR 80s-90s (personally reviewed)  Labs    CBC Recent Labs    03/14/22 0308 03/15/22 0404  WBC 9.0 8.9  HGB 10.2* 10.7*  HCT 30.0* 30.8*  MCV 90.1 88.8  PLT 61* 244*   Basic Metabolic Panel Recent Labs    03/14/22 0308  03/15/22 0404  NA 133* 133*  K 4.4 3.3*  CL 102 93*  CO2 22 27  GLUCOSE 100* 121*  BUN 18 19  CREATININE 1.33* 1.49*  CALCIUM 7.7* 7.7*  MG 2.2 2.0   Liver Function Tests No results for input(s): "AST", "ALT", "ALKPHOS", "BILITOT", "PROT", "ALBUMIN" in the last 72 hours.  No results for input(s): "LIPASE", "AMYLASE" in the last 72 hours. Cardiac Enzymes No results for input(s): "CKTOTAL", "CKMB", "CKMBINDEX", "TROPONINI" in the last 72 hours.  BNP: BNP (last 3 results) Recent Labs    05/11/21 1052  BNP 23.1    ProBNP (last 3 results) No results for input(s): "PROBNP" in the last 8760 hours.   D-Dimer No results for input(s): "DDIMER" in the last 72 hours. Hemoglobin A1C No results for input(s): "HGBA1C" in the last 72 hours.  Fasting Lipid Panel No results for input(s): "CHOL", "HDL", "LDLCALC", "TRIG", "CHOLHDL", "LDLDIRECT" in the last 72 hours.  Thyroid Function Tests No results for input(s): "TSH", "T4TOTAL", "T3FREE", "THYROIDAB" in the last 72 hours.  Invalid input(s): "FREET3"  Other results:   Imaging    No results found.   Medications:     Scheduled Medications:  acetaminophen  1,000 mg Oral Q6H   Or   acetaminophen (TYLENOL) oral liquid 160 mg/5 mL  1,000 mg Per  Tube Q6H   aspirin EC  325 mg Oral Daily   Or   aspirin  324 mg Per Tube Daily   atorvastatin  80 mg Oral Daily   bisacodyl  10 mg Oral Daily   Or   bisacodyl  10 mg Rectal Daily   Chlorhexidine Gluconate Cloth  6 each Topical Daily   docusate  200 mg Per Tube Daily   famotidine  20 mg Per Tube Daily   fentaNYL (SUBLIMAZE) injection  100 mcg Intravenous Once   insulin aspart  3-9 Units Subcutaneous Q4H   insulin detemir  15 Units Subcutaneous Q12H   sodium chloride flush  3 mL Intravenous Q12H    Infusions:  sodium chloride Stopped (03/12/22 2030)   sodium chloride     sodium chloride Stopped (03/12/22 2101)   dexmedetomidine (PRECEDEX) IV infusion Stopped (03/14/22  1549)   DOBUTamine 2.5 mcg/kg/min (03/15/22 0700)   furosemide (LASIX) 200 mg in dextrose 5 % 100 mL (2 mg/mL) infusion 8 mg/hr (03/15/22 0732)   insulin Stopped (03/14/22 2300)   lactated ringers     lactated ringers Stopped (03/12/22 2225)   lactated ringers 20 mL/hr at 03/15/22 0700   milrinone 0.25 mcg/kg/min (03/15/22 0700)   norepinephrine (LEVOPHED) Adult infusion     potassium chloride 10 mEq (03/15/22 0730)    PRN Medications: sodium chloride, dextrose, lactated ringers, midazolam, morphine injection, mouth rinse, oxyCODONE, prochlorperazine, sodium chloride, sodium chloride flush, traMADol    Patient Profile   67 y/o male w/ h/o submassive PE in March 2023 w/ subsequent RV dysfunction that recovered post treatment with catheter directed thrombolysis, on Xarelto, admitted w/ large acute anterolateral STEMI c/b acute systolic heart failure/shock requiring placement of IABP. Now s/p CABG. AHF team consulted to assist w/ management of HF. Also COVID +   Assessment/Plan   1. Cardiogenic shock/ischemic cardiomyopathy: Echo pre-op with LV EF 25-30%, wall motion abnormalities, normal RV.  IABP placed pre-op for management of angina.  Intra-op, patient developed acute worsening of LV and RV function as well as acute rise in PA pressure (PASBP 80s-90s).  Difficult to wean off bypass, but eventually successful wean with iNO, inotropes, pressors.  Currently on IABP 1:1, dobutamine 2.5, milrinone 0.25. Now off NE, epinephrine, and VP w/ stable MAPs. CI 1.6 and CO-OX 51%. Increase milrinone to 0.375 to support CO. Would like to start weaning IABP once hemodynamics improve. -Start low-dose heparin -Recheck CO-OX this afternoon -CVP 12-13. Reduce lasix gtt to 6/hr, very brisk diuresis yesterday.    2. CAD: Anterolateral MI with cath showing 80% ostial LAD stenosis and 80% ostial large ramus stenosis.  Ostial lesions in close proximity made PCI very difficult and CABG was elected.  Patient  underwent LIMA-LAD and SVG-ramus  - ASA - Continue statin.  3. H/o PE: Submassive PE in 3/23, treated with catheter-directed thrombolysis.   - Starting anticoagulation 4. COVID-19+: Incidental finding at admission. Not on antivirals.  5. Acute hypoxemic respiratory failure: Extubated yesterday afternoon. - Diuresis.  -O2 stable on 4L [place] 6. Thrombocytopenia: Platelets 57K => 61K => 106K, suspect post-op though IABP may play role.  - Anticoagulation as above 7. Anemia: Post-op.  - Hgb stable 10.7 as above 8. Fever: Tmax 100.8 F overnight, now AF - No leukocytosis - ? ATX. Check CXR.   FINCH, LINDSAY N 03/15/2022 7:51 AM  Patient seen with PA, agree with the above note.   He is on dobutamine 2.5 + milrinone 0.25 today with IABP 1:1.  Co-ox  marginal at 55% with low CI 1.6 from [place].  CVP 12-13, diuresed quite well overnight on Lasix 6 mg/hr.  Creatinine mildly higher at 1.49.   General: NAD Neck: JVP 12 cm, no thyromegaly or thyroid nodule.  Lungs: Clear to auscultation bilaterally with normal respiratory effort. CV: Nondisplaced PMI.  Heart regular S1/S2, no S3/S4, no murmur.  No peripheral edema.   Abdomen: Soft, nontender, no hepatosplenomegaly, no distention.  Skin: Intact without lesions or rashes.  Neurologic: Alert and oriented x 3.  Psych: Normal affect. Extremities: No clubbing or cyanosis.  HEENT: Normal.   Increase milrinone to 0.375 today, continue current dobutamine.  Repeat co-ox and thermo CI around noon.  If adequate, will try to wean IABP today.  In the mean time, will anticoagulate with heparin gtt (will turn off if it looks like we can remove IABP today).   Continue Lasix but will decrease dose to 6 mg/hr.  Still want diuresis but can slow it a little.   Platelets improving.   Will need eventual oral anticoagulation with large PE in 3/23.    CRITICAL CARE Performed by: Loralie Champagne  Total critical care time: 40 minutes  Critical care time was exclusive  of separately billable procedures and treating other patients.  Critical care was necessary to treat or prevent imminent or life-threatening deterioration.  Critical care was time spent personally by me on the following activities: development of treatment plan with patient and/or surrogate as well as nursing, discussions with consultants, evaluation of patient's response to treatment, examination of patient, obtaining history from patient or surrogate, ordering and performing treatments and interventions, ordering and review of laboratory studies, ordering and review of radiographic studies, pulse oximetry and re-evaluation of patient's condition.  Loralie Champagne 03/15/2022 9:16 AM

## 2022-03-15 NOTE — Progress Notes (Signed)
ANTICOAGULATION CONSULT NOTE - Initial Consult  Pharmacy Consult for IV heparin Indication: IABP + hx DVT/PE   No Known Allergies  Patient Measurements: Height: '5\' 7"'$  (170.2 cm) Weight: 90.7 kg (199 lb 15.3 oz) IBW/kg (Calculated) : 66.1 Heparin Dosing Weight: 87 kg  Vital Signs: Temp: 99.1 F (37.3 C) (01/12 1630) Temp Source: Core (01/12 1600) BP: 134/55 (01/12 0800) Pulse Rate: 157 (01/12 1630)  Labs: Recent Labs    03/13/22 1732 03/14/22 0308 03/15/22 0404 03/15/22 0826 03/15/22 1130 03/15/22 1545  HGB 10.3* 10.2* 10.7*  --   --   --   HCT 28.9* 30.0* 30.8*  --   --   --   PLT 57* 61* 106*  --   --   --   HEPARINUNFRC  --   --   --  <0.10*  --  <0.10*  CREATININE 1.42* 1.33* 1.49*  --  1.42*  --      Estimated Creatinine Clearance: 55.7 mL/min (A) (by C-G formula based on SCr of 1.42 mg/dL (H)).   Medical History: Past Medical History:  Diagnosis Date   Arthritis    OA   Cough    WHITE SPUTUM OCC, NO FEVER COLD SYMPTOMS MAINLY   GERD (gastroesophageal reflux disease)     Medications:  Infusions:   sodium chloride Stopped (03/12/22 2030)   sodium chloride     sodium chloride Stopped (03/12/22 2101)   dexmedetomidine (PRECEDEX) IV infusion Stopped (03/14/22 1549)   DOBUTamine 4 mcg/kg/min (03/15/22 1609)   furosemide (LASIX) 200 mg in dextrose 5 % 100 mL (2 mg/mL) infusion 6 mg/hr (03/15/22 1600)   heparin 900 Units/hr (03/15/22 1600)   insulin Stopped (03/14/22 2300)   lactated ringers Stopped (03/12/22 2225)   lactated ringers 20 mL/hr at 03/15/22 1600   milrinone 0.375 mcg/kg/min (03/15/22 1600)   norepinephrine (LEVOPHED) Adult infusion      Assessment: 67 yo male CABG, now with IABP.  Also with hx PE/DVT.  Pharmacy asked to begin low-dose IV heparin gtt.  No overt bleeding noted.  Initial heparin level checked prior to heparin initiation is undetectable.  Last Xarelto dose PTA 1/7.  Heparin came back <0.1. No bleeding per Rn. We will  increase rate and check a 6 hr HL  Goal of Therapy:  Heparin level 0.3-0.5 while IABP in place. Monitor platelets by anticoagulation protocol: Yes   Plan:  Increase heparin gtt to 1050 units/hr. Check heparin level in 8 hrs. Daily heparin level and CBC.   Onnie Boer, PharmD, BCIDP, AAHIVP, CPP Infectious Disease Pharmacist 03/15/2022 4:43 PM

## 2022-03-15 NOTE — Progress Notes (Signed)
ANTICOAGULATION CONSULT NOTE - Initial Consult  Pharmacy Consult for IV heparin Indication: IABP + hx DVT/PE   No Known Allergies  Patient Measurements: Height: '5\' 7"'$  (170.2 cm) Weight: 90.7 kg (199 lb 15.3 oz) IBW/kg (Calculated) : 66.1 Heparin Dosing Weight: 87 kg  Vital Signs: Temp: 99.1 F (37.3 C) (01/12 1330) Temp Source: Core (01/12 0800) BP: 134/55 (01/12 0800) Pulse Rate: 97 (01/12 1330)  Labs: Recent Labs    03/12/22 1416 03/12/22 1420 03/13/22 1732 03/14/22 0308 03/15/22 0404 03/15/22 0826 03/15/22 1130  HGB 11.7*   < > 10.3* 10.2* 10.7*  --   --   HCT 36.0*   < > 28.9* 30.0* 30.8*  --   --   PLT 60*   < > 57* 61* 106*  --   --   APTT 37*  --   --   --   --   --   --   LABPROT 19.2*  --   --   --   --   --   --   INR 1.6*  --   --   --   --   --   --   HEPARINUNFRC  --   --   --   --   --  <0.10*  --   CREATININE  --    < > 1.42* 1.33* 1.49*  --  1.42*   < > = values in this interval not displayed.    Estimated Creatinine Clearance: 55.7 mL/min (A) (by C-G formula based on SCr of 1.42 mg/dL (H)).   Medical History: Past Medical History:  Diagnosis Date   Arthritis    OA   Cough    WHITE SPUTUM OCC, NO FEVER COLD SYMPTOMS MAINLY   GERD (gastroesophageal reflux disease)     Medications:  Infusions:   sodium chloride Stopped (03/12/22 2030)   sodium chloride     sodium chloride Stopped (03/12/22 2101)   dexmedetomidine (PRECEDEX) IV infusion Stopped (03/14/22 1549)   DOBUTamine 2.5 mcg/kg/min (03/15/22 1300)   furosemide (LASIX) 200 mg in dextrose 5 % 100 mL (2 mg/mL) infusion 6 mg/hr (03/15/22 1300)   heparin 900 Units/hr (03/15/22 1300)   insulin Stopped (03/14/22 2300)   lactated ringers Stopped (03/12/22 2225)   lactated ringers 20 mL/hr at 03/15/22 1300   milrinone 0.375 mcg/kg/min (03/15/22 1308)   norepinephrine (LEVOPHED) Adult infusion      Assessment: 67 yo male CABG, now with IABP.  Also with hx PE/DVT.  Pharmacy asked to begin  low-dose IV heparin gtt.  No overt bleeding noted.  Initial heparin level checked prior to heparin initiation is undetectable.  Last Xarelto dose PTA 1/7.  Goal of Therapy:  Heparin level 0.3-0.5 while IABP in place. Monitor platelets by anticoagulation protocol: Yes   Plan:  Start heparin gtt at 900 units/hr. Check heparin level in 8 hrs. Daily heparin level and CBC.   [place] Crane, Roylene Reason, BCCP Clinical Pharmacist  03/15/2022 1:53 PM   [facility] pharmacy phone numbers are listed on [email or web address]

## 2022-03-15 NOTE — Progress Notes (Addendum)
[address]       [place] [address]             [phone]      3 Days Post-Op Procedure(s) (LRB): CORONARY ARTERY BYPASS GRAFTING (CABG) X TWO BYPASSES USING LEFT INTERNAL MAMMARY ARTERY AND RIGHT GREATER SAPHENOUS  VEIN HARVEST. (N/A) TRANSESOPHAGEAL ECHOCARDIOGRAM (TEE) (N/A) Subjective: Patient is comfortably lying in bed.   Objective: Vital signs in last 24 hours: Temp:  [98.4 F (36.9 C)-100.8 F (38.2 C)] 99.1 F (37.3 C) (01/12 1315) Pulse Rate:  [80-267] 96 (01/12 1315) Cardiac Rhythm: Normal sinus rhythm (01/12 1200) Resp:  [8-32] 21 (01/12 1315) BP: (119-139)/(55-84) 134/55 (01/12 0800) SpO2:  [90 %-98 %] 94 % (01/12 1315) Arterial Line BP: (99-161)/(38-66) 135/56 (01/12 1315) FiO2 (%):  [40 %] 40 % (01/11 1458) Weight:  [90.7 kg] 90.7 kg (01/12 0500)  Hemodynamic parameters for last 24 hours: PAP: (22-47)/(12-22) 43/13 CVP:  [2 mmHg-15 mmHg] 6 mmHg CO:  [3.4 L/min-3.9 L/min] 3.9 L/min CI:  [1.7 L/min/m2-2 L/min/m2] 2 L/min/m2  Intake/Output from previous day: 01/11 0701 - 01/12 0700 In: 1243.8 [P.O.:170; I.V.:980.6; IV Piggyback:93.2] Out: 9476 [LYYTK:3546; Chest Tube:140] Intake/Output this shift: Total I/O In: 308.8 [I.V.:251.9; IV Piggyback:56.9] Out: 860 [Urine:860]  General appearance: alert and cooperative Neurologic: intact Heart: regular rate and rhythm, S1, S2 normal, no murmur, click, rub or gallop Lungs: Coarse lung sounds Abdomen: soft, non-tender; bowel sounds normal; no masses,  no organomegaly Extremities: SCDs in place Wound: Clean and dry, no erythema or sign of infection  Lab Results: Recent Labs    03/14/22 0308 03/15/22 0404  WBC 9.0 8.9  HGB 10.2* 10.7*  HCT 30.0* 30.8*  PLT 61* 106*   BMET:  Recent Labs    03/15/22 0404 03/15/22 1130  NA 133* 130*  K 3.3* 3.6  CL 93* 91*  CO2 27 25  GLUCOSE 121* 117*  BUN 19 18  CREATININE 1.49* 1.42*  CALCIUM 7.7* 7.8*    PT/INR:  Recent Labs     03/12/22 1416  LABPROT 19.2*  INR 1.6*   ABG    Component Value Date/Time   PHART 7.357 03/12/2022 1724   HCO3 19.4 (L) 03/12/2022 1724   TCO2 20 (L) 03/12/2022 1724   ACIDBASEDEF 6.0 (H) 03/12/2022 1724   O2SAT 50.9 03/15/2022 1150   CBG (last 3)  Recent Labs    03/15/22 0416 03/15/22 0654 03/15/22 1128  GLUCAP 120* 120* 112*    Assessment/Plan: S/P Procedure(s) (LRB): CORONARY ARTERY BYPASS GRAFTING (CABG) X TWO BYPASSES USING LEFT INTERNAL MAMMARY ARTERY AND RIGHT GREATER SAPHENOUS  VEIN HARVEST. (N/A) TRANSESOPHAGEAL ECHOCARDIOGRAM (TEE) (N/A)  CV: Extubated yesterday. NSR. Co-ox 55% this AM, 50% this afternoon. CI 2. On dobutamine 2.5, milrinone increased from 0.25 to 0.375 this afternoon. IABP 1:1 in place. Per Heart failure will wean IABP once hemodynamics improve. Appreciate heart failure team and critical care team assistance.   Pulm: Saturating well on 4L . Hx of PE, low dose heparin gtt started this AM per heart failure team. CT without air leak. CT output 140cc/24 hours. Leave CT for now.   GI: No nausea, clear liquid diet as tolerated.   Endo: CBG 120/120/112, on SSI.  Renal: Volume overload, +12lbs. Cr 1.42. Lasix gtt decreased to 6/hr.   Expected postop ABLA: trending up H/H 10.7/30.8  ID: Tmax 100.9, WBC 8.9  Dispo: Continue milrinone and dobutamine for improved hemodynamics. Continue IABP until improved hemodynamics. Heart failure and  critical care teams following.    LOS: 5 days    Magdalene River, PA-C 03/15/2022  Agree with above. Rounded with CCM and HF team this am and plan generated

## 2022-03-15 NOTE — Progress Notes (Signed)
Visited with patient and provided emotional and spiritual support.  Wife and daughter at bedside. Chaplain available as needed.  Jaclynn Major, [place], [facility], Pager [phone]

## 2022-03-15 NOTE — Progress Notes (Signed)
NAME:  Juan Snow, MRN:  [id number], DOB:  05/14/1955, LOS: 5 ADMISSION DATE:  03/10/2022, CONSULTATION DATE:  03/12/22 REFERRING MD:  Kipp Brood, CHIEF COMPLAINT:  CABG   History of Present Illness:  67 year old man presented with chest pain on 03/10/22 found to have STEMI and severe 2V CAD, IABP placed with improvement in anginal symptoms and he was seen by cardiothoracic surgery.  Underwent CABG x 2 today (LIMA-LAD, SVG-ramus) with intra-op course complicated by difficulty weaning off bypass due to high PA pressures and low LV function; with iNO and high dose inotropes+pressors, able to be eventually weaned off pump.  Brought to ICU on pressors, inotropes, iNO, IABP, vent.  PCCM consulted to assist with management.   Pertinent  Medical History  GERD Prior VTE on [facility] PTA Arthtritis  Significant Hospital Events: Including procedures, antibiotic start and stop dates in addition to other pertinent events   1/7 admit 1/9 CABG x 2, saphenous graft harvest 1/10 weaning inotropic and nitric oxide support 1/11 extubated   Interim History / Subjective:  Extubated yesterday. Remains comfortable with minimal pain. Off iNO.  Objective   Blood pressure (!) 134/55, pulse 90, temperature 98.6 F (37 C), resp. rate (!) 23, height '5\' 7"'$  (1.702 m), weight 90.7 kg, SpO2 94 %. PAP: (28-42)/(8-19) 35/14 CVP:  [3 mmHg-14 mmHg] 5 mmHg CO:  [3.4 L/min-3.9 L/min] 3.4 L/min CI:  [1.7 L/min/m2-1.9 L/min/m2] 1.7 L/min/m2  Vent Mode: PSV;CPAP FiO2 (%):  [40 %] 40 % PEEP:  [5 cmH20] 5 cmH20 Pressure Support:  [5 cmH20-8 cmH20] 5 cmH20   Intake/Output Summary (Last 24 hours) at 03/15/2022 0936 Last data filed at 03/15/2022 0900 Gross per 24 hour  Intake 1279.64 ml  Output 6350 ml  Net -5070.36 ml    Filed Weights   03/13/22 0500 03/14/22 0500 03/15/22 0500  Weight: 98.9 kg 98.5 kg 90.7 kg    Examination: General: Well-nourished man in no distress HEENT: OG tube NG tube in place Chest: Clear to  auscultation bilaterally.  Tolerating pressure support. Cardiac: Midline sternotomy incision heart sounds normal with no rub.  Extremities warm well-perfused.  Intracardiac balloon pump at one-to-one. Abdomen soft nontender Extremities IV sites intact good distal perfusion GU: Foley catheter in place with good urine output.  ancillary tests personally reviewed  SCV O2 54.7% Creatinine slowly increasing to 1.49 Platelet count appears to be rebounding at 106  Assessment & Plan:   Post cardiotomy shock state - appears largely cardiogenic at this time.  STEMI s/p 2V CABG Likely baseline CKD GERD Hx PE on AC PTA  - Continue IS and multi-modal pain control.  - Marginal cardiac index but clinical perfusion appears better. Continue dobutamine and milrinone and attempt to wean IABP - Continue furosemide infusion as CVP and PAD still high.  Creatinine rising so infusion rate decreased.  - Pressors for MAP 70-80 - Restarting low dose heparin.   Best Practice (right click and "Reselect all SmartList Selections" daily)   Diet/type: NPO tube feeds DVT prophylaxis: not indicated GI prophylaxis: PPI Lines: Central line Foley:  Yes, and it is still needed Code Status:  full code Last date of multidisciplinary goals of care discussion [per primary]  CRITICAL CARE Performed by: Kipp Brood   Total critical care time: 40 minutes  Critical care time was exclusive of separately billable procedures and treating other patients.  Critical care was necessary to treat or prevent imminent or life-threatening deterioration.  Critical care was time spent personally by me  on the following activities: development of treatment plan with patient and/or surrogate as well as nursing, discussions with consultants, evaluation of patient's response to treatment, examination of patient, obtaining history from patient or surrogate, ordering and performing treatments and interventions, ordering and review of  laboratory studies, ordering and review of radiographic studies, pulse oximetry, re-evaluation of patient's condition and participation in multidisciplinary rounds.  Kipp Brood, MD [facility] ICU Physician [place]  Pager: [phone] Mobile: [phone] After hours: [phone].

## 2022-03-16 ENCOUNTER — Inpatient Hospital Stay (HOSPITAL_COMMUNITY): Payer: Medicare Other

## 2022-03-16 DIAGNOSIS — R570 Cardiogenic shock: Secondary | ICD-10-CM | POA: Diagnosis not present

## 2022-03-16 DIAGNOSIS — I2102 ST elevation (STEMI) myocardial infarction involving left anterior descending coronary artery: Secondary | ICD-10-CM | POA: Diagnosis not present

## 2022-03-16 DIAGNOSIS — J90 Pleural effusion, not elsewhere classified: Secondary | ICD-10-CM | POA: Diagnosis not present

## 2022-03-16 LAB — CBC
HCT: 32.5 % — ABNORMAL LOW (ref 39.0–52.0)
Hemoglobin: 11.5 g/dL — ABNORMAL LOW (ref 13.0–17.0)
MCH: 31 pg (ref 26.0–34.0)
MCHC: 35.4 g/dL (ref 30.0–36.0)
MCV: 87.6 fL (ref 80.0–100.0)
Platelets: 112 10*3/uL — ABNORMAL LOW (ref 150–400)
RBC: 3.71 MIL/uL — ABNORMAL LOW (ref 4.22–5.81)
RDW: 13.8 % (ref 11.5–15.5)
WBC: 9.5 10*3/uL (ref 4.0–10.5)
nRBC: 0 % (ref 0.0–0.2)

## 2022-03-16 LAB — BASIC METABOLIC PANEL
Anion gap: 12 (ref 5–15)
Anion gap: 11 (ref 5–15)
BUN: 18 mg/dL (ref 8–23)
BUN: 18 mg/dL (ref 8–23)
CO2: 29 mmol/L (ref 22–32)
CO2: 27 mmol/L (ref 22–32)
Calcium: 8.1 mg/dL — ABNORMAL LOW (ref 8.9–10.3)
Calcium: 8.3 mg/dL — ABNORMAL LOW (ref 8.9–10.3)
Chloride: 91 mmol/L — ABNORMAL LOW (ref 98–111)
Chloride: 91 mmol/L — ABNORMAL LOW (ref 98–111)
Creatinine, Ser: 1.36 mg/dL — ABNORMAL HIGH (ref 0.61–1.24)
Creatinine, Ser: 1.35 mg/dL — ABNORMAL HIGH (ref 0.61–1.24)
GFR, Estimated: 58 mL/min — ABNORMAL LOW (ref >60)
GFR, Estimated: 58 mL/min — ABNORMAL LOW (ref >60)
Glucose, Bld: 110 mg/dL — ABNORMAL HIGH (ref 70–99)
Glucose, Bld: 135 mg/dL — ABNORMAL HIGH (ref 70–99)
Potassium: 3.6 mmol/L (ref 3.5–5.1)
Potassium: 4.6 mmol/L (ref 3.5–5.1)
Sodium: 132 mmol/L — ABNORMAL LOW (ref 135–145)
Sodium: 129 mmol/L — ABNORMAL LOW (ref 135–145)

## 2022-03-16 LAB — COOXEMETRY PANEL
Carboxyhemoglobin: 0.6 % (ref 0.5–1.5)
Carboxyhemoglobin: 2.3 % — ABNORMAL HIGH (ref 0.5–1.5)
Methemoglobin: 1.5 % (ref 0.0–1.5)
Methemoglobin: <0.7 % (ref 0.0–1.5)
O2 Saturation: 57.9 %
O2 Saturation: 53.9 %
Total hemoglobin: 11.9 g/dL — ABNORMAL LOW (ref 12.0–16.0)
Total hemoglobin: 11.5 g/dL — ABNORMAL LOW (ref 12.0–16.0)

## 2022-03-16 LAB — HEPARIN LEVEL (UNFRACTIONATED): Heparin Unfractionated: <0.1 IU/mL — ABNORMAL LOW (ref 0.30–0.70)

## 2022-03-16 LAB — MAGNESIUM: Magnesium: 2.1 mg/dL (ref 1.7–2.4)

## 2022-03-16 LAB — GLUCOSE, CAPILLARY
Glucose-Capillary: 108 mg/dL — ABNORMAL HIGH (ref 70–99)
Glucose-Capillary: 109 mg/dL — ABNORMAL HIGH (ref 70–99)
Glucose-Capillary: 114 mg/dL — ABNORMAL HIGH (ref 70–99)
Glucose-Capillary: 119 mg/dL — ABNORMAL HIGH (ref 70–99)
Glucose-Capillary: 123 mg/dL — ABNORMAL HIGH (ref 70–99)
Glucose-Capillary: 129 mg/dL — ABNORMAL HIGH (ref 70–99)

## 2022-03-16 LAB — POCT ACTIVATED CLOTTING TIME
Activated Clotting Time: 179 seconds
Activated Clotting Time: 185 seconds
Activated Clotting Time: 185 seconds

## 2022-03-16 LAB — APTT: aPTT: 42 seconds — ABNORMAL HIGH (ref 24–36)

## 2022-03-16 MED ORDER — POTASSIUM CHLORIDE 20 MEQ PO PACK
40.0000 meq | PACK | ORAL | Status: DC
  Administered 2022-03-16: 40 meq via ORAL
  Filled 2022-03-16: qty 2

## 2022-03-16 MED ORDER — POTASSIUM CHLORIDE 10 MEQ/50ML IV SOLN
10.0000 meq | INTRAVENOUS | Status: AC
  Administered 2022-03-16 (×3): 10 meq via INTRAVENOUS
  Filled 2022-03-16 (×3): qty 50

## 2022-03-16 MED ORDER — POTASSIUM CHLORIDE CRYS ER 20 MEQ PO TBCR: 40.0000 meq | EXTENDED_RELEASE_TABLET | ORAL | Status: DC

## 2022-03-16 MED ORDER — POTASSIUM CHLORIDE 20 MEQ PO PACK
40.0000 meq | PACK | Freq: Once | ORAL | Status: AC
  Administered 2022-03-16: 40 meq via ORAL
  Filled 2022-03-16: qty 2

## 2022-03-16 MED ORDER — LIDOCAINE 5 % EX PTCH
2.0000 | MEDICATED_PATCH | Freq: Every day | CUTANEOUS | Status: DC
  Administered 2022-03-16 – 2022-03-25 (×6): 2 via TRANSDERMAL
  Filled 2022-03-16 (×10): qty 2

## 2022-03-16 MED ORDER — HEPARIN (PORCINE) 25000 UT/250ML-% IV SOLN
1500.0000 [IU]/h | INTRAVENOUS | Status: DC
  Administered 2022-03-17 – 2022-03-19 (×3): 1500 [IU]/h via INTRAVENOUS
  Filled 2022-03-16 (×3): qty 250

## 2022-03-16 MED ORDER — DIGOXIN 125 MCG PO TABS
0.1250 mg | ORAL_TABLET | Freq: Every day | ORAL | Status: DC
  Administered 2022-03-16 – 2022-03-20 (×5): 0.125 mg via ORAL
  Filled 2022-03-16 (×5): qty 1

## 2022-03-16 MED ORDER — FAMOTIDINE 20 MG PO TABS
20.0000 mg | ORAL_TABLET | Freq: Every day | ORAL | Status: DC
  Administered 2022-03-16 – 2022-03-28 (×13): 20 mg via ORAL
  Filled 2022-03-16 (×13): qty 1

## 2022-03-16 NOTE — Progress Notes (Signed)
[address]       [place] [address]             [phone]      4 Days Post-Op  Procedure(s) (LRB): CORONARY ARTERY BYPASS GRAFTING (CABG) X TWO BYPASSES USING LEFT INTERNAL MAMMARY ARTERY AND RIGHT GREATER SAPHENOUS  VEIN HARVEST. (N/A) TRANSESOPHAGEAL ECHOCARDIOGRAM (TEE) (N/A)  Total Length of Stay:  LOS: 6 days   SUBJECTIVE: No issues overnight. Pt has no specific complaints Not very hungry  Vitals:   03/16/22 0700 03/16/22 0715  BP:    Pulse: 86 83  Resp: (!) 22 20  Temp: 99 F (37.2 C) 98.6 F (37 C)  SpO2: 95% 94%    Intake/Output      01/12 0701 01/13 0700 01/13 0701 01/14 0700   P.O.     I.V. (mL/kg) 1345.7 (14.9)    IV Piggyback 197.1    Total Intake(mL/kg) 1542.8 (17)    Urine (mL/kg/hr) 2675 (1.2)    Chest Tube 50    Total Output 2725    Net -1182.2             sodium chloride Stopped (03/12/22 2030)   sodium chloride     sodium chloride Stopped (03/12/22 2101)   amiodarone 30 mg/hr (03/16/22 0700)   dexmedetomidine (PRECEDEX) IV infusion Stopped (03/14/22 1549)   DOBUTamine 4 mcg/kg/min (03/16/22 0700)   furosemide (LASIX) 200 mg in dextrose 5 % 100 mL (2 mg/mL) infusion 6 mg/hr (03/16/22 0700)   heparin 1,200 Units/hr (03/16/22 0700)   insulin Stopped (03/14/22 2300)   lactated ringers Stopped (03/12/22 2225)   lactated ringers 20 mL/hr at 03/16/22 0700   milrinone 0.375 mcg/kg/min (03/16/22 0700)   norepinephrine (LEVOPHED) Adult infusion      CBC    Component Value Date/Time   WBC 9.5 03/16/2022 0325   RBC 3.71 (L) 03/16/2022 0325   HGB 11.5 (L) 03/16/2022 0325   HCT 32.5 (L) 03/16/2022 0325   PLT 112 (L) 03/16/2022 0325   MCV 87.6 03/16/2022 0325   MCH 31.0 03/16/2022 0325   MCHC 35.4 03/16/2022 0325   RDW 13.8 03/16/2022 0325   LYMPHSABS 1.3 03/10/2022 1155   MONOABS 1.3 (H) 03/10/2022 1155   EOSABS 0.1 03/10/2022 1155   BASOSABS 0.0 03/10/2022 1155   CMP     Component Value Date/Time   NA  132 (L) 03/16/2022 0325   K 3.6 03/16/2022 0325   CL 91 (L) 03/16/2022 0325   CO2 29 03/16/2022 0325   GLUCOSE 110 (H) 03/16/2022 0325   BUN 18 03/16/2022 0325   CREATININE 1.36 (H) 03/16/2022 0325   CALCIUM 8.1 (L) 03/16/2022 0325   PROT 7.0 03/10/2022 1155   ALBUMIN 3.7 03/10/2022 1155   AST 26 03/10/2022 1155   ALT 24 03/10/2022 1155   ALKPHOS 48 03/10/2022 1155   BILITOT 0.5 03/10/2022 1155   GFRNONAA 58 (L) 03/16/2022 0325   GFRAA >60 06/26/2017 0529   ABG    Component Value Date/Time   PHART 7.357 03/12/2022 1724   PCO2ART 34.4 03/12/2022 1724   PO2ART 164 (H) 03/12/2022 1724   HCO3 19.4 (L) 03/12/2022 1724   TCO2 20 (L) 03/12/2022 1724   ACIDBASEDEF 6.0 (H) 03/12/2022 1724   O2SAT 57.9 03/16/2022 0520   CBG (last 3)  Recent Labs    03/15/22 1953 03/15/22 2325 03/16/22 0325  GLUCAP 126* 132* 123*  EXAM Lungs: overall decreased at bases Card: RR, incision dressing dry  Abd: firm but non tender Ext: cool Neuro: intact   ASSESSMENT: POD #4 SP CABG Hemodynamics stable with CI above 2 and Coox 59 on dobut 4, milrinone 0.375, amiodarone '30mg'$ /hr IABP 1:1.  Will put IABP to 1:2 and observe hemodynamics and hopefully remove later today Renal: on lasix drip with neg 1200cc. Cr 1.38  Will continue for now PACs: much decreased on amio drip. Continue for now Heparin drip: should stop before remove IABP Remove Cts after IABP out when off heparin  Coralie Common, MD 03/16/2022

## 2022-03-16 NOTE — Progress Notes (Signed)
NAME:  Juan Snow, MRN:  [id number], DOB:  [birth year], LOS: 6 ADMISSION DATE:  03/10/2022, CONSULTATION DATE:  03/12/22 REFERRING MD:  Kipp Brood, CHIEF COMPLAINT:  CABG   History of Present Illness:  67 year old man presented with chest pain on 03/10/22 found to have STEMI and severe 2V CAD, IABP placed with improvement in anginal symptoms and he was seen by cardiothoracic surgery.  Underwent CABG x 2 today (LIMA-LAD, SVG-ramus) with intra-op course complicated by difficulty weaning off bypass due to high PA pressures and low LV function; with iNO and high dose inotropes+pressors, able to be eventually weaned off pump.  Brought to ICU on pressors, inotropes, iNO, IABP, vent.  PCCM consulted to assist with management.   Pertinent  Medical History  GERD Prior VTE on [facility] PTA Arthtritis  Significant Hospital Events: Including procedures, antibiotic start and stop dates in addition to other pertinent events   1/7 admit 1/9 CABG x 2, saphenous graft harvest 1/10 weaning inotropic and nitric oxide support 1/11 extubated   Interim History / Subjective:  Feels well but still complains of some chest discomfort on deep inspiration.  Nurse states that he is not using his incentive spirometer effectively.  Objective   Blood pressure (!) 134/55, pulse 98, temperature 98.4 F (36.9 C), resp. rate (!) 21, height '5\' 7"'$  (1.702 m), weight 90.5 kg, SpO2 96 %. PAP: (32-54)/(10-28) 37/14 CVP:  [3 mmHg-23 mmHg] 8 mmHg CO:  [3.5 L/min-4.3 L/min] 4 L/min CI:  [1.8 L/min/m2-2.2 L/min/m2] 2 L/min/m2      Intake/Output Summary (Last 24 hours) at 03/16/2022 1144 Last data filed at 03/16/2022 1100 Gross per 24 hour  Intake 1600.56 ml  Output 2540 ml  Net -939.44 ml    Filed Weights   03/14/22 0500 03/15/22 0500 03/16/22 0645  Weight: 98.5 kg 90.7 kg 90.5 kg    Examination: General: Well-nourished man in no distress HEENT: Intact oral mucosa.  No pressure injuries. Chest: Clear to auscultation  bilaterally.  Splinting on deep inspiration Cardiac: Midline sternotomy incision intact.  Positive rub.Marland Kitchen  Extremities warm well-perfused.  Intracardiac balloon pump at 1-2 Abdomen soft nontender Extremities IV sites intact good distal perfusion GU: Foley catheter in place with good urine output.  ancillary tests personally reviewed  SCV O2 from 57.9 to 53.9 decrease in balloon pump frequency Creatinine slowly decreasing to 1.35 Platelet count increasing to 112  Assessment & Plan:   Post cardiotomy shock state - appears largely cardiogenic at this time.  STEMI s/p 2V CABG Likely baseline CKD GERD Hx PE on AC PTA  - Continue IS and multi-modal pain control.  Added lidocaine patch.  Will consider gabapentin if continues to have discomfort -Cardiac index and SCV O2 remain somewhat marginal but good pulse pressure and good clinical perfusion.  SCV O2 did not change substantially with balloon pump at 1-2.  Will remove. -Slow down furosemide infusion as CVP and PAD still high but improving -Continue dobutamine and milrinone at current rates. - Restarting low dose heparin.   Best Practice (right click and "Reselect all SmartList Selections" daily)   Diet/type: Regular diet. DVT prophylaxis: not indicated GI prophylaxis: PPI Lines: Central line Foley:  Yes, and it is still needed Code Status:  full code Last date of multidisciplinary goals of care discussion [per primary]  CRITICAL CARE Performed by: Kipp Brood   Total critical care time: 40 minutes  Critical care time was exclusive of separately billable procedures and treating other patients.  Critical care was  necessary to treat or prevent imminent or life-threatening deterioration.  Critical care was time spent personally by me on the following activities: development of treatment plan with patient and/or surrogate as well as nursing, discussions with consultants, evaluation of patient's response to treatment, examination  of patient, obtaining history from patient or surrogate, ordering and performing treatments and interventions, ordering and review of laboratory studies, ordering and review of radiographic studies, pulse oximetry, re-evaluation of patient's condition and participation in multidisciplinary rounds.  Kipp Brood, MD [facility] ICU Physician [place]  Pager: [phone] Mobile: [phone] After hours: [phone].

## 2022-03-16 NOTE — Progress Notes (Signed)
Called in to removed IABP. ACT 185 and has been consistent within the last 2 hours. PTT 42. Verbal order was given to Efraim Kaufmann, Rn okay to remove IABP per Dr. Lynetta Mare. IABP was removed intact from right femoral artery. DP pulses were audible via doppler prior to removal. Manual pressure was applied x 89mn per VTally Joe RN. No bleeding or hematoma noted post removal. Site level 0. 4x4 tegaderm dressing was applied to site. Post activity and precautions were explained to patient and family; verbalized understanding. DP pulses were audible post removal of IABP. Vital signs remained stable throughout removal and post. Care released to [facility] [place]

## 2022-03-16 NOTE — Progress Notes (Signed)
Patient ID: Juan Snow, male   DOB: [birth year], 67 y.o.   MRN: [id number]     Advanced Heart Failure Rounding Note  PCP-Cardiologist: None   Subjective:    S/p CABG x 2   POD #4   Currently on 4 DBA + 0.375 Milrinone. IABP at 1:1. Heparin gtt.   [place]wan numbers: CVP 11 PA 42/21 CI 2.1 Co-ox 58%  I/Os net negative 1182 on Lasix gtt 6 mg/hr.  Weight trending down. Creatinine stable.   He remains on amiodarone 30 mg/hr with SVT runs. No significant arrhythmias overnight.   Afebrile, WBCs normal.   No complaints.    Objective:   Weight Range: 90.5 kg Body mass index is 31.25 kg/m.   Vital Signs:   Temp:  [98.4 F (36.9 C)-99.5 F (37.5 C)] 98.4 F (36.9 C) (01/13 0745) Pulse Rate:  [83-208] 163 (01/13 0745) Resp:  [5-31] 19 (01/13 0745) SpO2:  [88 %-96 %] 95 % (01/13 0745) Arterial Line BP: (93-162)/(46-84) 121/64 (01/13 0745) Weight:  [90.5 kg] 90.5 kg (01/13 0645) Last BM Date : 03/10/22  Weight change: Filed Weights   03/14/22 0500 03/15/22 0500 03/16/22 0645  Weight: 98.5 kg 90.7 kg 90.5 kg    Intake/Output:   Intake/Output Summary (Last 24 hours) at 03/16/2022 0802 Last data filed at 03/16/2022 0700 Gross per 24 hour  Intake 1477.69 ml  Output 2505 ml  Net -1027.31 ml      Physical Exam    General: NAD Neck: JVP 10 cm, no thyromegaly or thyroid nodule.  Lungs: Clear to auscultation bilaterally with normal respiratory effort. CV: Nondisplaced PMI.  Heart regular S1/S2, no S3/S4, no murmur.  No peripheral edema.   Abdomen: Soft, nontender, no hepatosplenomegaly, no distention.  Skin: Intact without lesions or rashes.  Neurologic: Alert and oriented x 3.  Psych: Normal affect. Extremities: No clubbing or cyanosis.  HEENT: Normal.   Telemetry   SR 80s-90s, short SVT runs (personally reviewed)  Labs    CBC Recent Labs    03/15/22 0404 03/16/22 0325  WBC 8.9 9.5  HGB 10.7* 11.5*  HCT 30.8* 32.5*  MCV 88.8 87.6  PLT 106* 112*    Basic Metabolic Panel Recent Labs    03/14/22 0308 03/15/22 0404 03/15/22 1130 03/16/22 0325  NA 133* 133* 130* 132*  K 4.4 3.3* 3.6 3.6  CL 102 93* 91* 91*  CO2 '22 27 25 29  '$ GLUCOSE 100* 121* 117* 110*  BUN '18 19 18 18  '$ CREATININE 1.33* 1.49* 1.42* 1.36*  CALCIUM 7.7* 7.7* 7.8* 8.1*  MG 2.2 2.0  --   --    Liver Function Tests No results for input(s): "AST", "ALT", "ALKPHOS", "BILITOT", "PROT", "ALBUMIN" in the last 72 hours.  No results for input(s): "LIPASE", "AMYLASE" in the last 72 hours. Cardiac Enzymes No results for input(s): "CKTOTAL", "CKMB", "CKMBINDEX", "TROPONINI" in the last 72 hours.  BNP: BNP (last 3 results) Recent Labs    05/11/21 1052  BNP 23.1    ProBNP (last 3 results) No results for input(s): "PROBNP" in the last 8760 hours.   D-Dimer No results for input(s): "DDIMER" in the last 72 hours. Hemoglobin A1C No results for input(s): "HGBA1C" in the last 72 hours.  Fasting Lipid Panel No results for input(s): "CHOL", "HDL", "LDLCALC", "TRIG", "CHOLHDL", "LDLDIRECT" in the last 72 hours.  Thyroid Function Tests No results for input(s): "TSH", "T4TOTAL", "T3FREE", "THYROIDAB" in the last 72 hours.  Invalid input(s): "FREET3"  Other results:   Imaging  DG CHEST PORT 1 VIEW  Result Date: 03/15/2022 CLINICAL DATA:  Fever. EXAM: PORTABLE CHEST 1 VIEW COMPARISON:  Chest radiograph 03/13/2022. FINDINGS: Interval removal of the endotracheal tube and enteric tube. Unchanged position of mediastinal and bilateral pleural drainage catheters. Unchanged position of the right IJ approach pulmonary artery catheter with apparent coiling in the right atrium and tip projecting over the pulmonary trunk. Intact median sternotomy wires. Intra-aortic balloon pump is noted over the left lower chest. Low lung volumes with bibasilar opacities favored to represent atelectasis. Unchanged linear opacity in the right midlung, also likely atelectasis. Mild blunting of  the costophrenic sulci bilaterally. Prominence of the cardiomediastinal silhouette is favored secondary to decreased lung volumes. IMPRESSION: 1. Interval removal of the endotracheal and enteric tubes with decreased lung volumes and bibasilar atelectasis. 2. Otherwise unchanged support lines and tubes. 3. Trace bilateral pleural effusions. Electronically Signed   By: Emmit Alexanders M.D   On: 03/15/2022 09:24     Medications:     Scheduled Medications:  acetaminophen  1,000 mg Oral Q6H   Or   acetaminophen (TYLENOL) oral liquid 160 mg/5 mL  1,000 mg Per Tube Q6H   aspirin EC  325 mg Oral Daily   Or   aspirin  324 mg Per Tube Daily   atorvastatin  80 mg Oral Daily   Chlorhexidine Gluconate Cloth  6 each Topical Daily   docusate sodium  200 mg Oral Daily   famotidine  20 mg Per Tube Daily   fentaNYL (SUBLIMAZE) injection  100 mcg Intravenous Once   insulin aspart  3-9 Units Subcutaneous Q4H   insulin detemir  15 Units Subcutaneous Q12H   lidocaine  2 patch Transdermal Daily   sodium chloride flush  3 mL Intravenous Q12H    Infusions:  sodium chloride Stopped (03/12/22 2030)   sodium chloride     sodium chloride Stopped (03/12/22 2101)   amiodarone 30 mg/hr (03/16/22 0700)   dexmedetomidine (PRECEDEX) IV infusion Stopped (03/14/22 1549)   DOBUTamine 4 mcg/kg/min (03/16/22 0700)   furosemide (LASIX) 200 mg in dextrose 5 % 100 mL (2 mg/mL) infusion 6 mg/hr (03/16/22 0700)   heparin 1,200 Units/hr (03/16/22 0700)   insulin Stopped (03/14/22 2300)   lactated ringers Stopped (03/12/22 2225)   lactated ringers 20 mL/hr at 03/16/22 0700   milrinone 0.375 mcg/kg/min (03/16/22 0700)   norepinephrine (LEVOPHED) Adult infusion      PRN Medications: sodium chloride, dextrose, morphine injection, mouth rinse, oxyCODONE, prochlorperazine, sodium chloride, sodium chloride flush, traMADol    Patient Profile   67 y/o male w/ h/o submassive PE in March 2023 w/ subsequent RV dysfunction that  recovered post treatment with catheter directed thrombolysis, on Xarelto, admitted w/ large acute anterolateral STEMI c/b acute systolic heart failure/shock requiring placement of IABP. Now s/p CABG. AHF team consulted to assist w/ management of HF. Also COVID +   Assessment/Plan   1. Cardiogenic shock/ischemic cardiomyopathy: Echo pre-op with LV EF 25-30%, wall motion abnormalities, normal RV.  IABP placed pre-op for management of angina.  Intra-op, patient developed acute worsening of LV and RV function as well as acute rise in PA pressure (PASBP 80s-90s).  Difficult to wean off bypass, but eventually successful wean with iNO, inotropes, pressors.  Currently on IABP 1:1, dobutamine 4, milrinone 0.375. Now off NE, epinephrine, and VP w/ stable MAPs. CI 2.1 and CO-OX 58%. CVP 11 with good diuresis on Lasix 6 mg/hr.  Hemodynamics stable enough to try wean of IABP today.  -  Drop IABP to 1:2; if he tolerates decrease to 1:3, hold heparin, and will remove.  - Continue milrinone and dobutamine at current doses.  - Add digoxin 0.125 daily.  - Continue Lasix 6 mg/hr today to aim for gentle diuresis.    2. CAD: Anterolateral MI with cath showing 80% ostial LAD stenosis and 80% ostial large ramus stenosis.  Ostial lesions in close proximity made PCI very difficult and CABG was elected.  Patient underwent LIMA-LAD and SVG-ramus  - ASA - Continue statin.  3. H/o PE: Submassive PE in 3/23, treated with catheter-directed thrombolysis.   - On heparin gtt, will hold for IABP pull then restart afterwards.  4. COVID-19+: Incidental finding at admission. Not on antivirals.  5. Acute hypoxemic respiratory failure: Extubated, stable.  6. Thrombocytopenia: Platelets 57K => 61K => 106K => 112K, suspect post-op though IABP may play role.  7. Anemia: Post-op.  - Hgb stable 11.5 8. Atrial ectopy: PACs and SVT, have not seen definite atrial fibrillation.  - Continue amiodarone gtt.   CRITICAL CARE Performed by: Loralie Champagne  Total critical care time: 40 minutes  Critical care time was exclusive of separately billable procedures and treating other patients.  Critical care was necessary to treat or prevent imminent or life-threatening deterioration.  Critical care was time spent personally by me on the following activities: development of treatment plan with patient and/or surrogate as well as nursing, discussions with consultants, evaluation of patient's response to treatment, examination of patient, obtaining history from patient or surrogate, ordering and performing treatments and interventions, ordering and review of laboratory studies, ordering and review of radiographic studies, pulse oximetry and re-evaluation of patient's condition.  Loralie Champagne 03/16/2022 8:02 AM

## 2022-03-16 NOTE — Progress Notes (Addendum)
ANTICOAGULATION CONSULT NOTE  Pharmacy Consult for IV heparin Indication: IABP + hx DVT/PE   No Known Allergies  Patient Measurements: Height: '5\' 7"'$  (170.2 cm) Weight: 90.5 kg (199 lb 8.3 oz) IBW/kg (Calculated) : 66.1 Heparin Dosing Weight: 87 kg  Vital Signs: Temp: 98.6 F (37 C) (01/13 1415) Temp Source: Core (01/13 1200) Pulse Rate: 83 (01/13 1415)  Labs: Recent Labs    03/14/22 0308 03/15/22 0404 03/15/22 0826 03/15/22 1130 03/15/22 1545 03/16/22 0148 03/16/22 0325 03/16/22 0900 03/16/22 1233  HGB 10.2* 10.7*  --   --   --   --  11.5*  --   --   HCT 30.0* 30.8*  --   --   --   --  32.5*  --   --   PLT 61* 106*  --   --   --   --  112*  --   --   APTT  --   --   --   --   --   --   --   --  42*  HEPARINUNFRC  --   --  <0.10*  --  <0.10* <0.10*  --   --   --   CREATININE 1.33* 1.49*  --  1.42*  --   --  1.36* 1.35*  --      Estimated Creatinine Clearance: 58.6 mL/min (A) (by C-G formula based on SCr of 1.35 mg/dL (H)).   Medical History: Past Medical History:  Diagnosis Date   Arthritis    OA   Cough    WHITE SPUTUM OCC, NO FEVER COLD SYMPTOMS MAINLY   GERD (gastroesophageal reflux disease)     Medications:  Infusions:   sodium chloride Stopped (03/12/22 2030)   sodium chloride     sodium chloride Stopped (03/12/22 2101)   amiodarone 30 mg/hr (03/16/22 1400)   DOBUTamine 4 mcg/kg/min (03/16/22 1400)   furosemide (LASIX) 200 mg in dextrose 5 % 100 mL (2 mg/mL) infusion 6 mg/hr (03/16/22 1400)   lactated ringers Stopped (03/12/22 2225)   lactated ringers 20 mL/hr at 03/16/22 1400   milrinone 0.375 mcg/kg/min (03/16/22 1400)   norepinephrine (LEVOPHED) Adult infusion      Assessment: 67 yo male CABG, now with IABP.  Also with hx PE/DVT.  Pharmacy asked to begin low-dose IV heparin gtt.  No overt bleeding noted. Initial heparin level checked prior to heparin initiation is undetectable.  Last Xarelto dose PTA 1/7.  Heparin held for IABP removal.  IABP removed ~1330 and pressure held until ~1400. Discussed with Dr. Aundra Dubin and will hold heparin x6h. Will keep low heparin level goal given recent sternotomy.  Goal of Therapy:  Heparin level 0.3-0.5  Monitor platelets by anticoagulation protocol: Yes   Plan:  Resume heparin with no bolus at 1200 units/h tonight at 2000 Check heparin level 8h after restarting  Arrie Senate, PharmD, BCPS, [facility] Clinical Pharmacist [phone] Please check AMION for all [facility] Pharmacy numbers 03/16/2022

## 2022-03-16 NOTE — Progress Notes (Signed)
ANTICOAGULATION CONSULT NOTE  Pharmacy Consult for IV heparin Indication: IABP + hx DVT/PE   No Known Allergies  Patient Measurements: Height: '5\' 7"'$  (170.2 cm) Weight: 90.7 kg (199 lb 15.3 oz) IBW/kg (Calculated) : 66.1 Heparin Dosing Weight: 87 kg  Vital Signs: Temp: 98.8 F (37.1 C) (01/13 0200) Temp Source: Core (01/12 1600) Pulse Rate: 208 (01/13 0200)  Labs: Recent Labs    03/13/22 1732 03/14/22 0308 03/15/22 0404 03/15/22 0826 03/15/22 1130 03/15/22 1545 03/16/22 0148  HGB 10.3* 10.2* 10.7*  --   --   --   --   HCT 28.9* 30.0* 30.8*  --   --   --   --   PLT 57* 61* 106*  --   --   --   --   HEPARINUNFRC  --   --   --  <0.10*  --  <0.10* <0.10*  CREATININE 1.42* 1.33* 1.49*  --  1.42*  --   --      Estimated Creatinine Clearance: 55.7 mL/min (A) (by C-G formula based on SCr of 1.42 mg/dL (H)).   Medical History: Past Medical History:  Diagnosis Date   Arthritis    OA   Cough    WHITE SPUTUM OCC, NO FEVER COLD SYMPTOMS MAINLY   GERD (gastroesophageal reflux disease)     Medications:  Infusions:   sodium chloride Stopped (03/12/22 2030)   sodium chloride     sodium chloride Stopped (03/12/22 2101)   amiodarone 30 mg/hr (03/16/22 0200)   dexmedetomidine (PRECEDEX) IV infusion Stopped (03/14/22 1549)   DOBUTamine 4 mcg/kg/min (03/16/22 0200)   furosemide (LASIX) 200 mg in dextrose 5 % 100 mL (2 mg/mL) infusion 6 mg/hr (03/16/22 0200)   heparin 1,050 Units/hr (03/16/22 0200)   insulin Stopped (03/14/22 2300)   lactated ringers Stopped (03/12/22 2225)   lactated ringers 20 mL/hr at 03/16/22 0200   milrinone 0.375 mcg/kg/min (03/16/22 0200)   norepinephrine (LEVOPHED) Adult infusion      Assessment: 67 yo male CABG, now with IABP.  Also with hx PE/DVT.  Pharmacy asked to begin low-dose IV heparin gtt.  No overt bleeding noted.  Initial heparin level checked prior to heparin initiation is undetectable.  Last Xarelto dose PTA 1/7.  1/13 AM update:   Heparin level remains low  Goal of Therapy:  Heparin level 0.3-0.5 while IABP in place. Monitor platelets by anticoagulation protocol: Yes   Plan:  Inc heparin to 1200 units/hr. Check heparin level in 8 hrs. Daily heparin level and CBC.   Narda Bonds, PharmD, BCPS Clinical Pharmacist Phone: [phone]

## 2022-03-17 ENCOUNTER — Inpatient Hospital Stay (HOSPITAL_COMMUNITY): Payer: Medicare Other

## 2022-03-17 DIAGNOSIS — I2102 ST elevation (STEMI) myocardial infarction involving left anterior descending coronary artery: Secondary | ICD-10-CM | POA: Diagnosis not present

## 2022-03-17 DIAGNOSIS — J90 Pleural effusion, not elsewhere classified: Secondary | ICD-10-CM | POA: Diagnosis not present

## 2022-03-17 DIAGNOSIS — R570 Cardiogenic shock: Secondary | ICD-10-CM | POA: Diagnosis not present

## 2022-03-17 LAB — COOXEMETRY PANEL
Carboxyhemoglobin: 1.3 % (ref 0.5–1.5)
Carboxyhemoglobin: 1.2 % (ref 0.5–1.5)
Methemoglobin: <0.7 % (ref 0.0–1.5)
Methemoglobin: <0.7 % (ref 0.0–1.5)
O2 Saturation: 45.7 %
O2 Saturation: 56 %
Total hemoglobin: 11.9 g/dL — ABNORMAL LOW (ref 12.0–16.0)
Total hemoglobin: 12 g/dL (ref 12.0–16.0)

## 2022-03-17 LAB — GLUCOSE, CAPILLARY
Glucose-Capillary: 147 mg/dL — ABNORMAL HIGH (ref 70–99)
Glucose-Capillary: 109 mg/dL — ABNORMAL HIGH (ref 70–99)
Glucose-Capillary: 109 mg/dL — ABNORMAL HIGH (ref 70–99)
Glucose-Capillary: 140 mg/dL — ABNORMAL HIGH (ref 70–99)

## 2022-03-17 LAB — BASIC METABOLIC PANEL
Anion gap: 11 (ref 5–15)
BUN: 19 mg/dL (ref 8–23)
CO2: 28 mmol/L (ref 22–32)
Calcium: 8.2 mg/dL — ABNORMAL LOW (ref 8.9–10.3)
Chloride: 93 mmol/L — ABNORMAL LOW (ref 98–111)
Creatinine, Ser: 1.28 mg/dL — ABNORMAL HIGH (ref 0.61–1.24)
GFR, Estimated: >60 mL/min (ref >60)
Glucose, Bld: 107 mg/dL — ABNORMAL HIGH (ref 70–99)
Potassium: 3.8 mmol/L (ref 3.5–5.1)
Sodium: 132 mmol/L — ABNORMAL LOW (ref 135–145)

## 2022-03-17 LAB — CBC
HCT: 33.5 % — ABNORMAL LOW (ref 39.0–52.0)
Hemoglobin: 11.8 g/dL — ABNORMAL LOW (ref 13.0–17.0)
MCH: 30.7 pg (ref 26.0–34.0)
MCHC: 35.2 g/dL (ref 30.0–36.0)
MCV: 87.2 fL (ref 80.0–100.0)
Platelets: 118 10*3/uL — ABNORMAL LOW (ref 150–400)
RBC: 3.84 MIL/uL — ABNORMAL LOW (ref 4.22–5.81)
RDW: 13.8 % (ref 11.5–15.5)
WBC: 9.4 10*3/uL (ref 4.0–10.5)
nRBC: 0.5 % — ABNORMAL HIGH (ref 0.0–0.2)

## 2022-03-17 LAB — HEPARIN LEVEL (UNFRACTIONATED)
Heparin Unfractionated: 0.13 IU/mL — ABNORMAL LOW (ref 0.30–0.70)
Heparin Unfractionated: 0.23 IU/mL — ABNORMAL LOW (ref 0.30–0.70)

## 2022-03-17 MED ORDER — POTASSIUM CHLORIDE CRYS ER 20 MEQ PO TBCR
40.0000 meq | EXTENDED_RELEASE_TABLET | ORAL | Status: AC
  Administered 2022-03-17 (×2): 40 meq via ORAL
  Filled 2022-03-17 (×2): qty 2

## 2022-03-17 NOTE — Progress Notes (Signed)
[address]       ,[place] [address]             [phone]      5 Days Post-Op  Procedure(s) (LRB): CORONARY ARTERY BYPASS GRAFTING (CABG) X TWO BYPASSES USING LEFT INTERNAL MAMMARY ARTERY AND RIGHT GREATER SAPHENOUS  VEIN HARVEST. (N/A) TRANSESOPHAGEAL ECHOCARDIOGRAM (TEE) (N/A)  Total Length of Stay:  LOS: 7 days   SUBJECTIVE: Dangled over bedside and did much better than last night Had only some sleep last night Tolerated diet  Vitals:   03/17/22 0600 03/17/22 0700  BP:    Pulse: 85 82  Resp: (!) 21 (!) 23  Temp: 99.1 F (37.3 C) 99 F (37.2 C)  SpO2: 93% 91%    Intake/Output      01/13 0701 01/14 0700 01/14 0701 01/15 0700   I.V. (mL/kg) 1486.7 (16.1)    IV Piggyback 15.7    Total Intake(mL/kg) 1502.4 (16.3)    Urine (mL/kg/hr) 1810 (0.8)    Chest Tube 60    Total Output 1870    Net -367.6             sodium chloride Stopped (03/12/22 2030)   sodium chloride     sodium chloride Stopped (03/12/22 2101)   amiodarone 30 mg/hr (03/17/22 0700)   DOBUTamine 4 mcg/kg/min (03/17/22 0700)   furosemide (LASIX) 200 mg in dextrose 5 % 100 mL (2 mg/mL) infusion 6 mg/hr (03/17/22 0700)   heparin 1,350 Units/hr (03/17/22 0700)   lactated ringers Stopped (03/12/22 2225)   lactated ringers 20 mL/hr at 03/17/22 0700   milrinone 0.375 mcg/kg/min (03/17/22 0700)   norepinephrine (LEVOPHED) Adult infusion      CBC    Component Value Date/Time   WBC 9.4 03/17/2022 0340   RBC 3.84 (L) 03/17/2022 0340   HGB 11.8 (L) 03/17/2022 0340   HCT 33.5 (L) 03/17/2022 0340   PLT 118 (L) 03/17/2022 0340   MCV 87.2 03/17/2022 0340   MCH 30.7 03/17/2022 0340   MCHC 35.2 03/17/2022 0340   RDW 13.8 03/17/2022 0340   LYMPHSABS 1.3 03/10/2022 1155   MONOABS 1.3 (H) 03/10/2022 1155   EOSABS 0.1 03/10/2022 1155   BASOSABS 0.0 03/10/2022 1155   CMP     Component Value Date/Time   NA 132 (L) 03/17/2022 0340   K 3.8 03/17/2022 0340   CL 93 (L)  03/17/2022 0340   CO2 28 03/17/2022 0340   GLUCOSE 107 (H) 03/17/2022 0340   BUN 19 03/17/2022 0340   CREATININE 1.28 (H) 03/17/2022 0340   CALCIUM 8.2 (L) 03/17/2022 0340   PROT 7.0 03/10/2022 1155   ALBUMIN 3.7 03/10/2022 1155   AST 26 03/10/2022 1155   ALT 24 03/10/2022 1155   ALKPHOS 48 03/10/2022 1155   BILITOT 0.5 03/10/2022 1155   GFRNONAA >60 03/17/2022 0340   GFRAA >60 06/26/2017 0529   ABG    Component Value Date/Time   PHART 7.357 03/12/2022 1724   PCO2ART 34.4 03/12/2022 1724   PO2ART 164 (H) 03/12/2022 1724   HCO3 19.4 (L) 03/12/2022 1724   TCO2 20 (L) 03/12/2022 1724   ACIDBASEDEF 6.0 (H) 03/12/2022 1724   O2SAT 45.7 03/17/2022 0505   CBG (last 3)  Recent Labs    03/16/22 1556 03/16/22 2019 03/16/22 2353  GLUCAP 108* 119* 129*  EXAM Lungs: clear Card: rr Wound: clean and dry Ext: warm Neuro: alert   ASSESSMENT: POD#5 SP CABG Hemodynamics stable still on Milrinone  at .375 [place] 4 but Coox this am 45.7  Will remove swan today if ok with HF team. No real room to wean meds at this point Renal: cr improved today. Continue diuresis Continues on heparin drip   Coralie Common, MD 03/17/2022

## 2022-03-17 NOTE — Progress Notes (Signed)
NAME:  Juan Snow, MRN:  [id number], DOB:  [birth year], LOS: 7 ADMISSION DATE:  03/10/2022, CONSULTATION DATE:  03/12/22 REFERRING MD:  Kipp Brood, CHIEF COMPLAINT:  CABG   History of Present Illness:  67 year old man presented with chest pain on 03/10/22 found to have STEMI and severe 2V CAD, IABP placed with improvement in anginal symptoms and he was seen by cardiothoracic surgery.  Underwent CABG x 2 today (LIMA-LAD, SVG-ramus) with intra-op course complicated by difficulty weaning off bypass due to high PA pressures and low LV function; with iNO and high dose inotropes+pressors, able to be eventually weaned off pump.  Brought to ICU on pressors, inotropes, iNO, IABP, vent.  PCCM consulted to assist with management.   Pertinent  Medical History  GERD Prior VTE on [facility] PTA Arthtritis  Significant Hospital Events: Including procedures, antibiotic start and stop dates in addition to other pertinent events   1/7 admit 1/9 CABG x 2, saphenous graft harvest 1/10 weaning inotropic and nitric oxide support 1/11 extubated  11/13 intracardiac balloon pump removed.  Interim History / Subjective:  Feels well but still complains of some chest discomfort on deep inspiration.  Nurse states that he is not using his incentive spirometer effectively.  Objective   Blood pressure 97/82, pulse 94, temperature 98.6 F (37 C), temperature source Oral, resp. rate (!) 34, height '5\' 7"'$  (1.702 m), weight 92.1 kg, SpO2 94 %. PAP: (30-53)/(12-26) 39/20 CVP:  [5 mmHg-39 mmHg] 14 mmHg CO:  [3.3 L/min-4.4 L/min] 3.9 L/min CI:  [1.7 L/min/m2-2.2 L/min/m2] 1.9 L/min/m2      Intake/Output Summary (Last 24 hours) at 03/17/2022 1430 Last data filed at 03/17/2022 1400 Gross per 24 hour  Intake 1537.22 ml  Output 2032 ml  Net -494.78 ml    Filed Weights   03/15/22 0500 03/16/22 0645 03/17/22 0600  Weight: 90.7 kg 90.5 kg 92.1 kg    Examination: General: Well-nourished man in no distress HEENT: Intact oral  mucosa.  No pressure injuries. Chest: Clear to auscultation bilaterally.  Splinting on deep inspiration Cardiac: Midline sternotomy incision intact.  Positive rub.Marland Kitchen  Extremities warm well-perfused.  On amiodarone dobutamine furosemide and milrinone Abdomen soft nontender Extremities IV sites intact good distal perfusion GU: Foley catheter in place with good urine output.  ancillary tests personally reviewed  SCV O2 from 56 off intra-aortic balloon pump. Sodium stable at 132 Creatinine slowly decreasing to 1.28 with further diuresis Platelet count increasing to 118  Assessment & Plan:   Post cardiotomy shock state - appears largely cardiogenic at this time.  STEMI s/p 2V CABG Likely baseline CKD GERD Hx PE on AC PTA  - Continue IS and multi-modal pain control.  Added lidocaine patch.  Will consider gabapentin if continues to have discomfort, but seems well controlled. -Incentive spirometry and mobilization -Wean O2 to keep sat greater than 92% -Continue aggressive diuresis now nearly 4 L negative -Continue dobutamine and milrinone at current rates. -Continue low dose heparin.   Best Practice (right click and "Reselect all SmartList Selections" daily)   Diet/type: Regular diet. DVT prophylaxis: not indicated GI prophylaxis: PPI Lines: Central line Foley:  Yes, and it is still needed Code Status:  full code Last date of multidisciplinary goals of care discussion [per primary]  CRITICAL CARE Performed by: Kipp Brood   Total critical care time: 35 minutes  Critical care time was exclusive of separately billable procedures and treating other patients.  Critical care was necessary to treat or prevent imminent or life-threatening  deterioration.  Critical care was time spent personally by me on the following activities: development of treatment plan with patient and/or surrogate as well as nursing, discussions with consultants, evaluation of patient's response to treatment,  examination of patient, obtaining history from patient or surrogate, ordering and performing treatments and interventions, ordering and review of laboratory studies, ordering and review of radiographic studies, pulse oximetry, re-evaluation of patient's condition and participation in multidisciplinary rounds.  Kipp Brood, MD [facility] ICU Physician [place]  Pager: [phone] Mobile: [phone] After hours: [phone].

## 2022-03-17 NOTE — Progress Notes (Signed)
ANTICOAGULATION CONSULT NOTE  Pharmacy Consult for IV heparin Indication: IABP + hx DVT/PE   No Known Allergies  Patient Measurements: Height: '5\' 7"'$  (170.2 cm) Weight: 92.1 kg (203 lb 0.7 oz) IBW/kg (Calculated) : 66.1 Heparin Dosing Weight: 87 kg  Vital Signs: Temp: 98.6 F (37 C) (01/14 1110) Temp Source: Oral (01/14 1110) BP: 126/81 (01/14 1100) Pulse Rate: 80 (01/14 1100)  Labs: Recent Labs    03/15/22 0404 03/15/22 0826 03/16/22 0148 03/16/22 0325 03/16/22 0900 03/16/22 1233 03/17/22 0340 03/17/22 1305  HGB 10.7*  --   --  11.5*  --   --  11.8*  --   HCT 30.8*  --   --  32.5*  --   --  33.5*  --   PLT 106*  --   --  112*  --   --  118*  --   APTT  --   --   --   --   --  42*  --   --   HEPARINUNFRC  --    < > <0.10*  --   --   --  0.13* 0.23*  CREATININE 1.49*   < >  --  1.36* 1.35*  --  1.28*  --    < > = values in this interval not displayed.     Estimated Creatinine Clearance: 62.3 mL/min (A) (by C-G formula based on SCr of 1.28 mg/dL (H)).   Medical History: Past Medical History:  Diagnosis Date   Arthritis    OA   Cough    WHITE SPUTUM OCC, NO FEVER COLD SYMPTOMS MAINLY   GERD (gastroesophageal reflux disease)     Medications:  Infusions:   sodium chloride Stopped (03/12/22 2030)   sodium chloride     sodium chloride Stopped (03/12/22 2101)   amiodarone 30 mg/hr (03/17/22 1200)   DOBUTamine 4 mcg/kg/min (03/17/22 1200)   furosemide (LASIX) 200 mg in dextrose 5 % 100 mL (2 mg/mL) infusion 6 mg/hr (03/17/22 1200)   heparin 1,350 Units/hr (03/17/22 1200)   lactated ringers Stopped (03/12/22 2225)   lactated ringers 20 mL/hr at 03/17/22 1200   milrinone 0.375 mcg/kg/min (03/17/22 1200)   norepinephrine (LEVOPHED) Adult infusion      Assessment: 67 yo male CABG, now with IABP.  Also with hx PE/DVT.  Pharmacy asked to begin low-dose IV heparin gtt.  No overt bleeding noted. Initial heparin level checked prior to heparin initiation is  undetectable and last Xarelto dose PTA 1/7.  Heparin resumed after IABP removal 1/13. Heparin level remains subtherapeutic at 0.23 but trending up. Will keep low heparin level goal given recent sternotomy.  Goal of Therapy:  Heparin level 0.3-0.5  Monitor platelets by anticoagulation protocol: Yes   Plan:  Increase heparin to 1500 units/h Recheck heparin level with morning labs   Arrie Senate, PharmD, BCPS, [facility] Clinical Pharmacist [phone] Please check AMION for all [facility] Pharmacy numbers 03/17/2022

## 2022-03-17 NOTE — Progress Notes (Addendum)
Patient ID: Juan Snow, male   DOB: [birth year], 67 y.o.   MRN: [id number]   Advanced Heart Failure Rounding Note  PCP-Cardiologist: None   Subjective:    S/p CABG x 2   POD #5   Currently on 4 DBA + 0.375 Milrinone. IABP removed yesterday.   Swan numbers: CVP 8 PA 38/18 CI 2 Co-ox 46%  I/Os mildly negative on Lasix gtt 6 mg/hr, creatinine lower at 1.28.    He remains on amiodarone 30 mg/hr with SVT runs. No significant arrhythmias overnight.   Afebrile, WBCs normal.   Had bowel movement, hungry this morning.  No complaints.    Objective:   Weight Range: 92.1 kg Body mass index is 31.8 kg/m.   Vital Signs:   Temp:  [98.2 F (36.8 C)-99.5 F (37.5 C)] 98.6 F (37 C) (01/14 0730) Pulse Rate:  [75-178] 80 (01/14 0730) Resp:  [13-31] 20 (01/14 0730) BP: (99-123)/(70-91) 121/81 (01/14 0500) SpO2:  [89 %-97 %] 93 % (01/14 0730) Arterial Line BP: (96-141)/(56-90) 106/67 (01/14 0730) Weight:  [92.1 kg] 92.1 kg (01/14 0600) Last BM Date : 03/10/22  Weight change: Filed Weights   03/15/22 0500 03/16/22 0645 03/17/22 0600  Weight: 90.7 kg 90.5 kg 92.1 kg    Intake/Output:   Intake/Output Summary (Last 24 hours) at 03/17/2022 0804 Last data filed at 03/17/2022 0700 Gross per 24 hour  Intake 1420.3 ml  Output 1705 ml  Net -284.7 ml      Physical Exam    General: NAD Neck: No JVD, no thyromegaly or thyroid nodule.  Lungs: Clear to auscultation bilaterally with normal respiratory effort. CV: Nondisplaced PMI.  Heart regular S1/S2, no S3/S4, no murmur.  No peripheral edema.   Abdomen: Soft, nontender, no hepatosplenomegaly, no distention.  Skin: Intact without lesions or rashes.  Neurologic: Alert and oriented x 3.  Psych: Normal affect. Extremities: No clubbing or cyanosis.  HEENT: Normal.    Telemetry   SR 80s-90s, short SVT runs (personally reviewed)  Labs    CBC Recent Labs    03/16/22 0325 03/17/22 0340  WBC 9.5 9.4  HGB 11.5* 11.8*  HCT  32.5* 33.5*  MCV 87.6 87.2  PLT 112* 106*   Basic Metabolic Panel Recent Labs    03/15/22 0404 03/15/22 1130 03/16/22 0900 03/17/22 0340  NA 133*   < > 129* 132*  K 3.3*   < > 4.6 3.8  CL 93*   < > 91* 93*  CO2 27   < > 27 28  GLUCOSE 121*   < > 135* 107*  BUN 19   < > 18 19  CREATININE 1.49*   < > 1.35* 1.28*  CALCIUM 7.7*   < > 8.3* 8.2*  MG 2.0  --  2.1  --    < > = values in this interval not displayed.   Liver Function Tests No results for input(s): "AST", "ALT", "ALKPHOS", "BILITOT", "PROT", "ALBUMIN" in the last 72 hours.  No results for input(s): "LIPASE", "AMYLASE" in the last 72 hours. Cardiac Enzymes No results for input(s): "CKTOTAL", "CKMB", "CKMBINDEX", "TROPONINI" in the last 72 hours.  BNP: BNP (last 3 results) Recent Labs    05/11/21 1052  BNP 23.1    ProBNP (last 3 results) No results for input(s): "PROBNP" in the last 8760 hours.   D-Dimer No results for input(s): "DDIMER" in the last 72 hours. Hemoglobin A1C No results for input(s): "HGBA1C" in the last 72 hours.  Fasting Lipid Panel No  results for input(s): "CHOL", "HDL", "LDLCALC", "TRIG", "CHOLHDL", "LDLDIRECT" in the last 72 hours.  Thyroid Function Tests No results for input(s): "TSH", "T4TOTAL", "T3FREE", "THYROIDAB" in the last 72 hours.  Invalid input(s): "FREET3"  Other results:   Imaging    DG CHEST PORT 1 VIEW  Result Date: 03/17/2022 CLINICAL DATA:  Status post CABG EXAM: PORTABLE CHEST 1 VIEW COMPARISON:  Prior chest x-ray yesterday FINDINGS: Right IJ vascular sheath conveys a Swan-Ganz catheter into the heart. As before, this 1 does a 360 degree loop in the right atrium before passing into the main pulmonary artery. The tip of the Swan overlies the pulmonary outflow tract. Patient is status post median sternotomy with evidence of multivessel CABG. Slightly improved aeration with decreased pulmonary edema and atelectasis. Persistent low inspiratory volumes with residual  bibasilar atelectasis. Probable small left pleural effusion. IMPRESSION: 1. Overall improved aeration and decreased edema. 2. Persistently low inspiratory volumes with bibasilar atelectasis. 3. Unchanged support apparatus. Electronically Signed   By: Jacqulynn Cadet M.D.   On: 03/17/2022 07:37     Medications:     Scheduled Medications:  acetaminophen  1,000 mg Oral Q6H   Or   acetaminophen (TYLENOL) oral liquid 160 mg/5 mL  1,000 mg Per Tube Q6H   aspirin EC  325 mg Oral Daily   Or   aspirin  324 mg Per Tube Daily   atorvastatin  80 mg Oral Daily   Chlorhexidine Gluconate Cloth  6 each Topical Daily   digoxin  0.125 mg Oral Daily   docusate sodium  200 mg Oral Daily   famotidine  20 mg Oral Daily   fentaNYL (SUBLIMAZE) injection  100 mcg Intravenous Once   insulin aspart  3-9 Units Subcutaneous Q4H   insulin detemir  15 Units Subcutaneous Q12H   lidocaine  2 patch Transdermal Daily   potassium chloride  40 mEq Oral Q4H   sodium chloride flush  3 mL Intravenous Q12H    Infusions:  sodium chloride Stopped (03/12/22 2030)   sodium chloride     sodium chloride Stopped (03/12/22 2101)   amiodarone 30 mg/hr (03/17/22 0700)   DOBUTamine 4 mcg/kg/min (03/17/22 0700)   furosemide (LASIX) 200 mg in dextrose 5 % 100 mL (2 mg/mL) infusion 6 mg/hr (03/17/22 0700)   heparin 1,350 Units/hr (03/17/22 0700)   lactated ringers Stopped (03/12/22 2225)   lactated ringers 20 mL/hr at 03/17/22 0700   milrinone 0.375 mcg/kg/min (03/17/22 0700)   norepinephrine (LEVOPHED) Adult infusion      PRN Medications: sodium chloride, dextrose, morphine injection, mouth rinse, oxyCODONE, prochlorperazine, sodium chloride, sodium chloride flush, traMADol    Patient Profile   67 y/o male w/ h/o submassive PE in March 2023 w/ subsequent RV dysfunction that recovered post treatment with catheter directed thrombolysis, on Xarelto, admitted w/ large acute anterolateral STEMI c/b acute systolic heart  failure/shock requiring placement of IABP. Now s/p CABG. AHF team consulted to assist w/ management of HF. Also COVID +   Assessment/Plan   1. Cardiogenic shock/ischemic cardiomyopathy: Echo pre-op with LV EF 25-30%, wall motion abnormalities, normal RV.  IABP placed pre-op for management of angina.  Intra-op, patient developed acute worsening of LV and RV function as well as acute rise in PA pressure (PASBP 80s-90s).  Difficult to wean off bypass, but eventually successful wean with iNO, inotropes, pressors, IABP.  After prolonged course, pressors weaned off and IABP removed yesterday.  Currently on dobutamine 4, milrinone 0.375. Cardiac output has remained marginal though creatinine  continues to trend down at 1.28.  CI 2 and early am CO-OX 46% today. CVP 8 on Lasix 6 mg/hr.   - Continue milrinone and dobutamine at current doses. Will resend co-ox.  - Continue digoxin 0.125 daily.  - Continue Lasix 6 mg/hr today to aim for gentle diuresis.  CVP 8 but weight still well above pre-op.   2. CAD: Anterolateral MI with cath showing 80% ostial LAD stenosis and 80% ostial large ramus stenosis.  Ostial lesions in close proximity made PCI very difficult and CABG was elected.  Patient underwent LIMA-LAD and SVG-ramus  - ASA - Continue statin.  3. H/o PE: Submassive PE in 3/23, treated with catheter-directed thrombolysis.   - On heparin gtt, will need eventual oral anticoagulation.  4. COVID-19+: Incidental finding at admission. Not on antivirals.  5. Acute hypoxemic respiratory failure: Extubated, stable.  6. Thrombocytopenia: Platelets 57K => 61K => 106K => 112K => 118, suspect post-op though IABP may have played role.  7. Anemia: Post-op. Hgb 9.4 today.  8. Atrial ectopy: PACs and SVT runs, have not seen definite atrial fibrillation. No arrhythmias overnight.  - Continue amiodarone gtt.   Mobilize.   CRITICAL CARE Performed by: Loralie Champagne  Total critical care time: 40 minutes  Critical care  time was exclusive of separately billable procedures and treating other patients.  Critical care was necessary to treat or prevent imminent or life-threatening deterioration.  Critical care was time spent personally by me on the following activities: development of treatment plan with patient and/or surrogate as well as nursing, discussions with consultants, evaluation of patient's response to treatment, examination of patient, obtaining history from patient or surrogate, ordering and performing treatments and interventions, ordering and review of laboratory studies, ordering and review of radiographic studies, pulse oximetry and re-evaluation of patient's condition.  Loralie Champagne 03/17/2022 8:04 AM

## 2022-03-17 NOTE — Progress Notes (Signed)
[address]       [place] [address]             [phone]       EVENING ROUNDS  Had a good day Up in chair Coox improved to 56 Will remove art line

## 2022-03-17 NOTE — Progress Notes (Signed)
ANTICOAGULATION CONSULT NOTE Pharmacy Consult for IV heparin Indication: hx DVT/PE  Brief A/P: Heparin level subtherapeutic Increase Heparin rate  No Known Allergies  Patient Measurements: Height: '5\' 7"'$  (170.2 cm) Weight: 90.5 kg (199 lb 8.3 oz) IBW/kg (Calculated) : 66.1 Heparin Dosing Weight: 87 kg  Vital Signs: Temp: 98.4 F (36.9 C) (01/14 0400) BP: 101/71 (01/14 0400) Pulse Rate: 81 (01/14 0400)  Labs: Recent Labs    03/15/22 0404 03/15/22 0826 03/15/22 1545 03/16/22 0148 03/16/22 0325 03/16/22 0900 03/16/22 1233 03/17/22 0340  HGB 10.7*  --   --   --  11.5*  --   --  11.8*  HCT 30.8*  --   --   --  32.5*  --   --  33.5*  PLT 106*  --   --   --  112*  --   --  118*  APTT  --   --   --   --   --   --  42*  --   HEPARINUNFRC  --    < > <0.10* <0.10*  --   --   --  0.13*  CREATININE 1.49*   < >  --   --  1.36* 1.35*  --  1.28*   < > = values in this interval not displayed.     Estimated Creatinine Clearance: 61.8 mL/min (A) (by C-G formula based on SCr of 1.28 mg/dL (H)).  Assessment: 67 yo male s/p CABG 1/9 with hx PE/DVT, Xarelto on hold, for heparin.   Goal of Therapy:  Heparin level 0.3-0.5 units/mL Monitor platelets by anticoagulation protocol: Yes   Plan:  Increase Heparin 1350 units/hr Check heparin level in 8 hours.  Phillis Knack, PharmD, BCPS

## 2022-03-18 ENCOUNTER — Inpatient Hospital Stay (HOSPITAL_COMMUNITY): Payer: Medicare Other

## 2022-03-18 DIAGNOSIS — N179 Acute kidney failure, unspecified: Secondary | ICD-10-CM | POA: Diagnosis not present

## 2022-03-18 DIAGNOSIS — R570 Cardiogenic shock: Secondary | ICD-10-CM | POA: Diagnosis not present

## 2022-03-18 DIAGNOSIS — J90 Pleural effusion, not elsewhere classified: Secondary | ICD-10-CM | POA: Insufficient documentation

## 2022-03-18 DIAGNOSIS — I5020 Unspecified systolic (congestive) heart failure: Secondary | ICD-10-CM | POA: Insufficient documentation

## 2022-03-18 DIAGNOSIS — I2102 ST elevation (STEMI) myocardial infarction involving left anterior descending coronary artery: Secondary | ICD-10-CM | POA: Diagnosis not present

## 2022-03-18 DIAGNOSIS — I5021 Acute systolic (congestive) heart failure: Secondary | ICD-10-CM | POA: Diagnosis not present

## 2022-03-18 DIAGNOSIS — I2510 Atherosclerotic heart disease of native coronary artery without angina pectoris: Secondary | ICD-10-CM | POA: Insufficient documentation

## 2022-03-18 LAB — COOXEMETRY PANEL
Carboxyhemoglobin: 1.1 % (ref 0.5–1.5)
Carboxyhemoglobin: 0.4 % — ABNORMAL LOW (ref 0.5–1.5)
Methemoglobin: 0.9 % (ref 0.0–1.5)
Methemoglobin: 1.5 % (ref 0.0–1.5)
O2 Saturation: 50.5 %
O2 Saturation: 55.5 %
Total hemoglobin: 11.4 g/dL — ABNORMAL LOW (ref 12.0–16.0)
Total hemoglobin: 11.3 g/dL — ABNORMAL LOW (ref 12.0–16.0)

## 2022-03-18 LAB — BASIC METABOLIC PANEL
Anion gap: 9 (ref 5–15)
Anion gap: 15 (ref 5–15)
BUN: 20 mg/dL (ref 8–23)
BUN: 24 mg/dL — ABNORMAL HIGH (ref 8–23)
CO2: 30 mmol/L (ref 22–32)
CO2: 25 mmol/L (ref 22–32)
Calcium: 8.1 mg/dL — ABNORMAL LOW (ref 8.9–10.3)
Calcium: 7.6 mg/dL — ABNORMAL LOW (ref 8.9–10.3)
Chloride: 91 mmol/L — ABNORMAL LOW (ref 98–111)
Chloride: 88 mmol/L — ABNORMAL LOW (ref 98–111)
Creatinine, Ser: 1.49 mg/dL — ABNORMAL HIGH (ref 0.61–1.24)
Creatinine, Ser: 1.7 mg/dL — ABNORMAL HIGH (ref 0.61–1.24)
GFR, Estimated: 52 mL/min — ABNORMAL LOW (ref >60)
GFR, Estimated: 44 mL/min — ABNORMAL LOW (ref >60)
Glucose, Bld: 104 mg/dL — ABNORMAL HIGH (ref 70–99)
Glucose, Bld: 193 mg/dL — ABNORMAL HIGH (ref 70–99)
Potassium: 4.5 mmol/L (ref 3.5–5.1)
Potassium: 3.6 mmol/L (ref 3.5–5.1)
Sodium: 130 mmol/L — ABNORMAL LOW (ref 135–145)
Sodium: 128 mmol/L — ABNORMAL LOW (ref 135–145)

## 2022-03-18 LAB — GLUCOSE, CAPILLARY
Glucose-Capillary: 90 mg/dL (ref 70–99)
Glucose-Capillary: 118 mg/dL — ABNORMAL HIGH (ref 70–99)
Glucose-Capillary: 128 mg/dL — ABNORMAL HIGH (ref 70–99)
Glucose-Capillary: 145 mg/dL — ABNORMAL HIGH (ref 70–99)
Glucose-Capillary: 134 mg/dL — ABNORMAL HIGH (ref 70–99)

## 2022-03-18 LAB — URINALYSIS, ROUTINE W REFLEX MICROSCOPIC
Bilirubin Urine: NEGATIVE
Glucose, UA: NEGATIVE mg/dL
Ketones, ur: NEGATIVE mg/dL
Nitrite: NEGATIVE
Protein, ur: NEGATIVE mg/dL
Specific Gravity, Urine: 1.017 (ref 1.005–1.030)
WBC, UA: >50 WBC/hpf — ABNORMAL HIGH (ref 0–5)
pH: 5 (ref 5.0–8.0)

## 2022-03-18 LAB — CBC
HCT: 32.2 % — ABNORMAL LOW (ref 39.0–52.0)
Hemoglobin: 11.1 g/dL — ABNORMAL LOW (ref 13.0–17.0)
MCH: 30.7 pg (ref 26.0–34.0)
MCHC: 34.5 g/dL (ref 30.0–36.0)
MCV: 89.2 fL (ref 80.0–100.0)
Platelets: 146 10*3/uL — ABNORMAL LOW (ref 150–400)
RBC: 3.61 MIL/uL — ABNORMAL LOW (ref 4.22–5.81)
RDW: 14.1 % (ref 11.5–15.5)
WBC: 22.6 10*3/uL — ABNORMAL HIGH (ref 4.0–10.5)
nRBC: 0.4 % — ABNORMAL HIGH (ref 0.0–0.2)

## 2022-03-18 LAB — PROCALCITONIN: Procalcitonin: 7.12 ng/mL

## 2022-03-18 LAB — HEPARIN LEVEL (UNFRACTIONATED): Heparin Unfractionated: 0.38 IU/mL (ref 0.30–0.70)

## 2022-03-18 LAB — MAGNESIUM: Magnesium: 1.7 mg/dL (ref 1.7–2.4)

## 2022-03-18 MED ORDER — METOLAZONE 2.5 MG PO TABS
2.5000 mg | ORAL_TABLET | Freq: Once | ORAL | Status: AC
  Administered 2022-03-18: 2.5 mg via ORAL
  Filled 2022-03-18: qty 1

## 2022-03-18 MED ORDER — VANCOMYCIN HCL IN DEXTROSE 1-5 GM/200ML-% IV SOLN
1000.0000 mg | INTRAVENOUS | Status: DC
  Administered 2022-03-19: 1000 mg via INTRAVENOUS
  Filled 2022-03-18: qty 200

## 2022-03-18 MED ORDER — VANCOMYCIN HCL 1750 MG/350ML IV SOLN
1750.0000 mg | Freq: Once | INTRAVENOUS | Status: AC
  Administered 2022-03-18: 1750 mg via INTRAVENOUS
  Filled 2022-03-18: qty 350

## 2022-03-18 MED ORDER — MAGNESIUM SULFATE 4 GM/100ML IV SOLN
4.0000 g | Freq: Once | INTRAVENOUS | Status: AC
  Administered 2022-03-18: 4 g via INTRAVENOUS
  Filled 2022-03-18: qty 100

## 2022-03-18 MED ORDER — SODIUM CHLORIDE 0.9 % IV SOLN
2.0000 g | Freq: Two times a day (BID) | INTRAVENOUS | Status: DC
  Administered 2022-03-18 – 2022-03-20 (×5): 2 g via INTRAVENOUS
  Filled 2022-03-18 (×5): qty 12.5

## 2022-03-18 MED ORDER — POTASSIUM CHLORIDE CRYS ER 20 MEQ PO TBCR
20.0000 meq | EXTENDED_RELEASE_TABLET | Freq: Once | ORAL | Status: AC
  Administered 2022-03-18: 20 meq via ORAL
  Filled 2022-03-18: qty 1

## 2022-03-18 NOTE — Progress Notes (Deleted)
ANTICOAGULATION CONSULT NOTE - Follow Up Consult  Pharmacy Consult for heparin Indication: hx DVT/PE  No Known Allergies  Patient Measurements: Height: '5\' 7"'$  (170.2 cm) Weight: 90.8 kg (200 lb 2.8 oz) IBW/kg (Calculated) : 66.1 Heparin Dosing Weight: 87 kg  Vital Signs: Temp: 98.6 F (37 C) (01/15 0318) Temp Source: Oral (01/15 0318) BP: 86/76 (01/15 0600) Pulse Rate: 96 (01/15 0700)  Labs: Recent Labs    03/16/22 0325 03/16/22 0900 03/16/22 1233 03/17/22 0340 03/17/22 1305 03/18/22 0440  HGB 11.5*  --   --  11.8*  --  11.1*  HCT 32.5*  --   --  33.5*  --  32.2*  PLT 112*  --   --  118*  --  146*  APTT  --   --  42*  --   --   --   HEPARINUNFRC  --   --   --  0.13* 0.23* 0.38  CREATININE 1.36* 1.35*  --  1.28*  --  1.49*    Estimated Creatinine Clearance: 53.1 mL/min (A) (by C-G formula based on SCr of 1.49 mg/dL (H)).   Medications:  Infusions:   sodium chloride Stopped (03/12/22 2030)   sodium chloride     sodium chloride Stopped (03/12/22 2101)   amiodarone 30 mg/hr (03/18/22 0700)   DOBUTamine 4 mcg/kg/min (03/18/22 0700)   furosemide (LASIX) 200 mg in dextrose 5 % 100 mL (2 mg/mL) infusion 6 mg/hr (03/18/22 0700)   heparin 1,500 Units/hr (03/18/22 0700)   lactated ringers Stopped (03/12/22 2225)   lactated ringers 20 mL/hr at 03/18/22 0700   milrinone 0.375 mcg/kg/min (03/18/22 0700)   norepinephrine (LEVOPHED) Adult infusion      Assessment: 67 yo male with CABG and IABP. Also with hx of PE/DVT.  Pharmacy asked to begin low-dose IV heparin gtt.  No overt bleeding noted. Initial heparin level checked prior to heparin initiation is undetectable and last Xarelto dose PTA 1/7.   Heparin resumed after IABP removal 1/13, targeting low heparin level goal given recent sternotomy. Heparin level this AM therapeutic at 0.38. PLTs 146 (uptrending), hemoglobin 11.4 (stable). No issues with infusion site or s/sx bleeding per RN.   Goal of Therapy:  Heparin level  0.3-0.5 units/ml Monitor platelets by anticoagulation protocol: Yes   Plan:  Continue heparin infusion at 1500 units/hr Recheck heparin level, platelets, hemoglobin, and s/sx of bleeding daily  Freddie Breech 03/18/2022,7:10 AM

## 2022-03-18 NOTE — Progress Notes (Addendum)
[address]       [place] [address]             [phone]      6 Days Post-Op Procedure(s) (LRB): CORONARY ARTERY BYPASS GRAFTING (CABG) X TWO BYPASSES USING LEFT INTERNAL MAMMARY ARTERY AND RIGHT GREATER SAPHENOUS  VEIN HARVEST. (N/A) TRANSESOPHAGEAL ECHOCARDIOGRAM (TEE) (N/A)  Subjective:  Patient states he is doing okay.  Making slow progress.  Only was able to walk 2 steps.  + BM  Objective: Vital signs in last 24 hours: Temp:  [97.8 F (36.6 C)-101 F (38.3 C)] 101 F (38.3 C) (01/15 0812) Pulse Rate:  [79-111] 96 (01/15 0700) Cardiac Rhythm: Normal sinus rhythm (01/14 2100) Resp:  [5-36] 31 (01/15 0700) BP: (83-134)/(46-91) 101/87 (01/15 0700) SpO2:  [90 %-98 %] 98 % (01/15 0700) Arterial Line BP: (90-148)/(46-80) 110/46 (01/14 1615) Weight:  [90.8 kg] 90.8 kg (01/15 0600)  Hemodynamic parameters for last 24 hours: CVP:  [5 mmHg-39 mmHg] 10 mmHg  Intake/Output from previous day: 01/14 0701 - 01/15 0700 In: 1657.6 [I.V.:1657.6] Out: 1972 [Urine:1972]  General appearance: alert, cooperative, and no distress Heart: regular rate and rhythm Lungs: diminished breath sounds bibasilar Abdomen: soft, non-tender; bowel sounds normal; no masses,  no organomegaly Extremities: edema trac Wound: clean and dry  Lab Results: Recent Labs    03/17/22 0340 03/18/22 0440  WBC 9.4 22.6*  HGB 11.8* 11.1*  HCT 33.5* 32.2*  PLT 118* 146*   BMET:  Recent Labs    03/17/22 0340 03/18/22 0440  NA 132* 130*  K 3.8 4.5  CL 93* 91*  CO2 28 30  GLUCOSE 107* 104*  BUN 19 20  CREATININE 1.28* 1.49*  CALCIUM 8.2* 8.1*    PT/INR: No results for input(s): "LABPROT", "INR" in the last 72 hours. ABG    Component Value Date/Time   PHART 7.357 03/12/2022 1724   HCO3 19.4 (L) 03/12/2022 1724   TCO2 20 (L) 03/12/2022 1724   ACIDBASEDEF 6.0 (H) 03/12/2022 1724   O2SAT 50.5 03/18/2022 0445   CBG (last 3)  Recent Labs    03/17/22 2016  03/18/22 0322 03/18/22 0808  GLUCAP 140* 90 118*    Assessment/Plan: S/P Procedure(s) (LRB): CORONARY ARTERY BYPASS GRAFTING (CABG) X TWO BYPASSES USING LEFT INTERNAL MAMMARY ARTERY AND RIGHT GREATER SAPHENOUS  VEIN HARVEST. (N/A) TRANSESOPHAGEAL ECHOCARDIOGRAM (TEE) (N/A)  CV- NSR, some atrial ectopy, remains on Dobutamine, Milrinone, Amiodarone... AHF is following Pulm- CXR with left pleural effusion/atelectasis.. Per CCM they will perform Ultrasound today, may need thoracentesis Renal-creatinine slightly elevated, likely related to diuresis.Marland Kitchen remains volume overloaded on Lasix drip at 10/hr per AHF ID- low grade temp today, spike in leukocytes to 22.4.Marland Kitchen no obvious signs of infection, blood cultures ordered, plan to replace central line, empiric ABX started CCM following Deconditioning- PT/OT consult placed CBGs controlled    LOS: 8 days    Ellwood Handler, PA-C 03/18/2022 Agree with above excellent assessment and plan Pt still requiring inotropic support  Work up of WBC and left effusion underway

## 2022-03-18 NOTE — Progress Notes (Signed)
ANTICOAGULATION CONSULT NOTE  Pharmacy Consult for IV heparin Indication: IABP + hx DVT/PE   No Known Allergies  Patient Measurements: Height: '5\' 7"'$  (170.2 cm) Weight: 90.8 kg (200 lb 2.8 oz) IBW/kg (Calculated) : 66.1 Heparin Dosing Weight: 87 kg  Vital Signs: Temp: 98.6 F (37 C) (01/15 0318) Temp Source: Oral (01/15 0318) BP: 101/87 (01/15 0700) Pulse Rate: 96 (01/15 0700)  Labs: Recent Labs    03/16/22 0325 03/16/22 0900 03/16/22 1233 03/17/22 0340 03/17/22 1305 03/18/22 0440  HGB 11.5*  --   --  11.8*  --  11.1*  HCT 32.5*  --   --  33.5*  --  32.2*  PLT 112*  --   --  118*  --  146*  APTT  --   --  42*  --   --   --   HEPARINUNFRC  --   --   --  0.13* 0.23* 0.38  CREATININE 1.36* 1.35*  --  1.28*  --  1.49*     Estimated Creatinine Clearance: 53.1 mL/min (A) (by C-G formula based on SCr of 1.49 mg/dL (H)).   Medical History: Past Medical History:  Diagnosis Date   Arthritis    OA   Cough    WHITE SPUTUM OCC, NO FEVER COLD SYMPTOMS MAINLY   GERD (gastroesophageal reflux disease)     Medications:  Infusions:   sodium chloride Stopped (03/12/22 2030)   sodium chloride     sodium chloride Stopped (03/12/22 2101)   amiodarone 30 mg/hr (03/18/22 0700)   DOBUTamine 4 mcg/kg/min (03/18/22 0700)   furosemide (LASIX) 200 mg in dextrose 5 % 100 mL (2 mg/mL) infusion 6 mg/hr (03/18/22 0700)   heparin 1,500 Units/hr (03/18/22 0700)   lactated ringers Stopped (03/12/22 2225)   lactated ringers 20 mL/hr at 03/18/22 0700   magnesium sulfate bolus IVPB     milrinone 0.375 mcg/kg/min (03/18/22 0700)   norepinephrine (LEVOPHED) Adult infusion      Assessment: 67 yo male CABG, now with IABP.  Also with hx PE/DVT.  Pharmacy asked to begin low-dose IV heparin gtt.  No overt bleeding noted. Initial heparin level checked prior to heparin initiation is undetectable and last Xarelto dose PTA 1/7.  Heparin resumed after IABP removal 1/13. Heparin level at goal this  morning at 0.38. Will keep low heparin level goal given recent sternotomy.  Goal of Therapy:  Heparin level 0.3-0.5  Monitor platelets by anticoagulation protocol: Yes   Plan:  Continue IV heparin at current rate. Daily heparin level and CBC.   [place] Crane, Roylene Reason, BCCP Clinical Pharmacist  03/18/2022 7:36 AM   [facility] pharmacy phone numbers are listed on [email or web address]

## 2022-03-18 NOTE — Procedures (Signed)
Chest ultrasound:  Bedside ultrasound of left side of chest was performed, showing very small pleural effusion with atelectasis

## 2022-03-18 NOTE — Progress Notes (Signed)
[facility] Progress Note: POD 6 CAB x 2  Dobut 5 Milrinone 0.25 Amio Heparin   Walking in hallway     Latest Ref Rng & Units 03/18/2022    4:40 AM 03/17/2022    3:40 AM 03/16/2022    3:25 AM  CBC  WBC 4.0 - 10.5 K/uL 22.6  9.4  9.5   Hemoglobin 13.0 - 17.0 g/dL 11.1  11.8  11.5   Hematocrit 39.0 - 52.0 % 32.2  33.5  32.5   Platelets 150 - 400 K/uL 146  118  112        Latest Ref Rng & Units 03/18/2022    4:40 AM 03/17/2022    3:40 AM 03/16/2022    9:00 AM  CMP  Glucose 70 - 99 mg/dL 104  107  135   BUN 8 - 23 mg/dL '20  19  18   '$ Creatinine 0.61 - 1.24 mg/dL 1.49  1.28  1.35   Sodium 135 - 145 mmol/L 130  132  129   Potassium 3.5 - 5.1 mmol/L 4.5  3.8  4.6   Chloride 98 - 111 mmol/L 91  93  91   CO2 22 - 32 mmol/L '30  28  27   '$ Calcium 8.9 - 10.3 mg/dL 8.1  8.2  8.3     ABG    Component Value Date/Time   PHART 7.357 03/12/2022 1724   PCO2ART 34.4 03/12/2022 1724   PO2ART 164 (H) 03/12/2022 1724   HCO3 19.4 (L) 03/12/2022 1724   TCO2 20 (L) 03/12/2022 1724   ACIDBASEDEF 6.0 (H) 03/12/2022 1724   O2SAT 55.5 03/18/2022 1118   Cont current plans.     WC 22 today up from 9. -  per primary Work up of WBC and left effusion underway. I see an ultrasound in chart demonstrating no sig pleural effusion.  Urine with leukocytes and rare bacteria  Started on vanc/cefepime this AM

## 2022-03-18 NOTE — Progress Notes (Signed)
Seen on afternoon rounds.  750 cc UOP so far since 7 am.  Lasix gtt increased from 6 to 10/hr this am.   CVP 8 this afternoon. Up from pre-op weight and appears overloaded on exam.  Will give 2.5 mg metolazone X 1 and 20 mEq potassium.

## 2022-03-18 NOTE — Progress Notes (Addendum)
Patient ID: Juan Snow, male   DOB: 1955/07/27, 67 y.o.   MRN: [id number]   Advanced Heart Failure Rounding Note  PCP-Cardiologist: None   Subjective:    S/p CABG x 2   POD #6   Currently on 4 DBA + 0.375 Milrinone. IABP removed 01/13.  SWAN out. Still has transducer.  CO-OX 50% >> recheck  CVP 10 sitting up in chair.  Continues on lasix gtt at 6/hr. ~2L UOP charted yesterday, only negative 300 cc.  WBC 9.4>>22.6. AF.   Feels okay. Reports he needs to have a bowel movement. Denies dyspnea but appears tachypneic.  Objective:   Weight Range: 90.8 kg Body mass index is 31.35 kg/m.   Vital Signs:   Temp:  [97.8 F (36.6 C)-98.6 F (37 C)] 98.6 F (37 C) (01/15 0318) Pulse Rate:  [79-111] 96 (01/15 0700) Resp:  [5-36] 31 (01/15 0700) BP: (83-134)/(46-91) 86/76 (01/15 0600) SpO2:  [90 %-98 %] 98 % (01/15 0700) Arterial Line BP: (90-148)/(46-80) 110/46 (01/14 1615) Weight:  [90.8 kg] 90.8 kg (01/15 0600) Last BM Date : 03/17/22  Weight change: Filed Weights   03/16/22 0645 03/17/22 0600 03/18/22 0600  Weight: 90.5 kg 92.1 kg 90.8 kg    Intake/Output:   Intake/Output Summary (Last 24 hours) at 03/18/2022 1062 Last data filed at 03/18/2022 0700 Gross per 24 hour  Intake 1657.55 ml  Output 1972 ml  Net -314.45 ml      Physical Exam    General:  Sitting up in chair.  HEENT: normal Neck: supple. Jvp 10 cm. Carotids 2+ bilat; no bruits. R IJ transducer. Cor: PMI nondisplaced. Regular rate & rhythm. No rubs, gallops or murmurs. Lungs: diminished in bases Abdomen: soft, nontender, + mildly distended.  Extremities: no cyanosis, clubbing, rash, 1+ edema Neuro: alert & orientedx3, cranial nerves grossly intact. moves all 4 extremities w/o difficulty. Affect pleasant    Telemetry   SR 80s-90s  Labs    CBC Recent Labs    03/17/22 0340 03/18/22 0440  WBC 9.4 22.6*  HGB 11.8* 11.1*  HCT 33.5* 32.2*  MCV 87.2 89.2  PLT 118* 694*   Basic Metabolic  Panel Recent Labs    03/16/22 0900 03/17/22 0340 03/18/22 0440  NA 129* 132* 130*  K 4.6 3.8 4.5  CL 91* 93* 91*  CO2 '27 28 30  '$ GLUCOSE 135* 107* 104*  BUN '18 19 20  '$ CREATININE 1.35* 1.28* 1.49*  CALCIUM 8.3* 8.2* 8.1*  MG 2.1  --  1.7   Liver Function Tests No results for input(s): "AST", "ALT", "ALKPHOS", "BILITOT", "PROT", "ALBUMIN" in the last 72 hours.  No results for input(s): "LIPASE", "AMYLASE" in the last 72 hours. Cardiac Enzymes No results for input(s): "CKTOTAL", "CKMB", "CKMBINDEX", "TROPONINI" in the last 72 hours.  BNP: BNP (last 3 results) Recent Labs    05/11/21 1052  BNP 23.1    ProBNP (last 3 results) No results for input(s): "PROBNP" in the last 8760 hours.   D-Dimer No results for input(s): "DDIMER" in the last 72 hours. Hemoglobin A1C No results for input(s): "HGBA1C" in the last 72 hours.  Fasting Lipid Panel No results for input(s): "CHOL", "HDL", "LDLCALC", "TRIG", "CHOLHDL", "LDLDIRECT" in the last 72 hours.  Thyroid Function Tests No results for input(s): "TSH", "T4TOTAL", "T3FREE", "THYROIDAB" in the last 72 hours.  Invalid input(s): "FREET3"  Other results:   Imaging    No results found.   Medications:     Scheduled Medications:  aspirin EC  325 mg Oral Daily   Or   aspirin  324 mg Per Tube Daily   atorvastatin  80 mg Oral Daily   Chlorhexidine Gluconate Cloth  6 each Topical Daily   digoxin  0.125 mg Oral Daily   docusate sodium  200 mg Oral Daily   famotidine  20 mg Oral Daily   fentaNYL (SUBLIMAZE) injection  100 mcg Intravenous Once   insulin aspart  3-9 Units Subcutaneous Q4H   insulin detemir  15 Units Subcutaneous Q12H   lidocaine  2 patch Transdermal Daily   sodium chloride flush  3 mL Intravenous Q12H    Infusions:  sodium chloride Stopped (03/12/22 2030)   sodium chloride     sodium chloride Stopped (03/12/22 2101)   amiodarone 30 mg/hr (03/18/22 0700)   DOBUTamine 4 mcg/kg/min (03/18/22 0700)    furosemide (LASIX) 200 mg in dextrose 5 % 100 mL (2 mg/mL) infusion 6 mg/hr (03/18/22 0700)   heparin 1,500 Units/hr (03/18/22 0700)   lactated ringers Stopped (03/12/22 2225)   lactated ringers 20 mL/hr at 03/18/22 0700   milrinone 0.375 mcg/kg/min (03/18/22 0700)   norepinephrine (LEVOPHED) Adult infusion      PRN Medications: sodium chloride, dextrose, morphine injection, mouth rinse, oxyCODONE, prochlorperazine, sodium chloride, sodium chloride flush, traMADol    Patient Profile   67 y/o male w/ h/o submassive PE in March 2023 w/ subsequent RV dysfunction that recovered post treatment with catheter directed thrombolysis, on Xarelto, admitted w/ large acute anterolateral STEMI c/b acute systolic heart failure/shock requiring placement of IABP. Now s/p CABG. AHF team consulted to assist w/ management of HF. Also COVID +   Assessment/Plan   1. Cardiogenic shock/ischemic cardiomyopathy: Echo pre-op with LV EF 25-30%, wall motion abnormalities, normal RV.  IABP placed pre-op for management of angina.  Intra-op, patient developed acute worsening of LV and RV function as well as acute rise in PA pressure (PASBP 80s-90s).  Difficult to wean off bypass, but eventually successful wean with iNO, inotropes, pressors, IABP.  After prolonged course, pressors weaned off and IABP removed yesterday.  Currently on dobutamine 4, milrinone 0.375. CO-OX 50%. Scr mildly up.  -Will try to narrow to 1 inotrope today. BP soft. Decrease milrinone to 0.25 and increase DBA to 5. Recheck CO-OX in a couple of hours. - SWAN out, still has transducer. - Continues on lasix gtt at 6/hr. Negative only 300 cc yesterday. Weight down another 3 lb but still up from preop. Increase lasix gtt to 10/hr. - Continue digoxin 0.125 daily.  - TED hose  2. CAD: Anterolateral MI with cath showing 80% ostial LAD stenosis and 80% ostial large ramus stenosis.  Ostial lesions in close proximity made PCI very difficult and CABG was  elected.  Patient underwent LIMA-LAD and SVG-ramus  - ASA - Continue statin.   3. H/o PE: Submassive PE in 3/23, treated with catheter-directed thrombolysis.   - On heparin gtt, will need eventual oral anticoagulation.   4. COVID-19+: Incidental finding at admission. Not on antivirals.   5. Acute hypoxemic respiratory failure: Extubated, stable.   6. Thrombocytopenia: Platelets as low as 57K now 146K, suspect post-op though IABP may have played role.   7. Anemia: Post-op. Hgb stable, 11.1 this am.  8. Atrial ectopy: PACs and SVT runs, have not seen definite atrial fibrillation. No arrhythmias overnight.  - Continue amiodarone gtt while on inotrope support.   9. Leukocytosis: -WBC 9.4>22.6 -Discussed with CCM at bedside. Plan to cover with empiric abx. -Aline out,  still has R IJ transducer -Check CXR and BC X 2   Mobilize today  FINCH, LINDSAY N 03/18/2022 7:12 AM   Agree with above.   Remains tenuous post CABG. On milrinone and DBA. Co-ox low. Narrow pulse pressure. CVP 9-10.   Feels weak. WBC up but AF.   General:  Sitting in chair. Tachypneic HEENT: normal Neck: supple. RIJ cath . Carotids 2+ bilat; no bruits. No lymphadenopathy or thryomegaly appreciated. Cor: PMI nondisplaced. Sternal incision ok Regular rate & rhythm. No rubs, gallops or murmurs. Lungs: clear +dull at bases Abdomen: soft, nontender, + distended. No hepatosplenomegaly. No bruits or masses. Hypoactive bowel sounds. Extremities: no cyanosis, clubbing, rash, 1+ edema Neuro: alert & orientedx3, cranial nerves grossly intact. moves all 4 extremities w/o difficulty. Affect pleasant  Remains tenuous. With soft BPs will increase DBA. Wean milrinone as tolerated.  Continue diuresis. Bcx drawn. Start broad spectrum abx. Continue aggressive pulmonary toilet. (D/w CCM).  Mobilize with PT.   CRITICAL CARE Performed by: Glori Bickers  Total critical care time: 35 minutes  Critical care time was  exclusive of separately billable procedures and treating other patients.  Critical care was necessary to treat or prevent imminent or life-threatening deterioration.  Critical care was time spent personally by me (independent of midlevel providers or residents) on the following activities: development of treatment plan with patient and/or surrogate as well as nursing, discussions with consultants, evaluation of patient's response to treatment, examination of patient, obtaining history from patient or surrogate, ordering and performing treatments and interventions, ordering and review of laboratory studies, ordering and review of radiographic studies, pulse oximetry and re-evaluation of patient's condition.  Glori Bickers, MD  9:05 AM

## 2022-03-18 NOTE — Progress Notes (Addendum)
NAME:  Juan Snow, MRN:  [id number], DOB:  05-05-1955, LOS: 7 ADMISSION DATE:  03/10/2022, CONSULTATION DATE:  03/12/22 REFERRING MD:  Kipp Brood, CHIEF COMPLAINT:  CABG   History of Present Illness:  67 year old man presented with chest pain on 03/10/22 found to have STEMI and severe 2V CAD, IABP placed with improvement in anginal symptoms and he was seen by cardiothoracic surgery.  Underwent CABG x 2 today (LIMA-LAD, SVG-ramus) with intra-op course complicated by difficulty weaning off bypass due to high PA pressures and low LV function; with iNO and high dose inotropes+pressors, able to be eventually weaned off pump.  Brought to ICU on pressors, inotropes, iNO, IABP, vent.  PCCM consulted to assist with management.   Pertinent  Medical History  GERD Prior VTE on [facility] PTA Arthtritis  Significant Hospital Events: Including procedures, antibiotic start and stop dates in addition to other pertinent events   1/7 admit 1/9 CABG x 2, saphenous graft harvest 1/10 weaning inotropic and nitric oxide support 1/11 extubated  11/13 intracardiac balloon pump removed. 1/15 worsening leukocytosis. Vanc and cefepime started. Cultures sent   Interim History / Subjective:  He is currently up in chair.  Denies any discomfort.  Does get diaphoretic with getting out of bed  Objective   Blood pressure 97/82, pulse 94, temperature 98.6 F (37 C), temperature source Oral, resp. rate (!) 34, height '5\' 7"'$  (1.702 m), weight 92.1 kg, SpO2 94 %. PAP: (30-53)/(12-26) 39/20 CVP:  [5 mmHg-39 mmHg] 14 mmHg CO:  [3.3 L/min-4.4 L/min] 3.9 L/min CI:  [1.7 L/min/m2-2.2 L/min/m2] 1.9 L/min/m2      Intake/Output Summary (Last 24 hours) at 03/17/2022 1430 Last data filed at 03/17/2022 1400 Gross per 24 hour  Intake 1537.22 ml  Output 2032 ml  Net -494.78 ml    Filed Weights   03/15/22 0500 03/16/22 0645 03/17/22 0600  Weight: 90.7 kg 90.5 kg 92.1 kg    Examination: General well-nourished 67 year old male  patient currently sitting up in chair.  He is in no acute distress this morning HEENT normocephalic atraumatic has a right IJ introducer in place the dressing is intact currently Pulmonary: Diminished bases.  A bedside ultrasound was used to evaluate left chest.  There is a small to moderate seemingly free-flowing left pleural effusion PCXR low volume film w/ bibasilar vol loss. ? Left effusion  Card co-ox 50.6 on: Milrinone and dobutamine, sternal incision well-approximated regular rate and rhythm Abdomen soft nontender no organomegaly Extremities are warm and dry with palpable pulses Neuro awake oriented no focal deficits Resolved issues     Assessment & Plan:   Post cardiotomy shock state - appears largely cardiogenic at this time.  STEMI s/p 2V CABG Plan Cont multi-modal pain rx Consider gabapentin if pain not tolerated IS  Mobilize  Lasix gtt per AHF team (currently 6/hr-->increased to '10mg'$ /hr) Cont Dobutamine, Milrinone & digoxin  F/u serial co-ox Amiodarone gtt  Small left pleural effusion Plan Continue to monitor bedside, will reeval with ultrasound tomorrow If spikes fever, leukocytosis persists, or clinically worsens might warrant thoracentesis  Leukocytosis  -blood cultures ordered -still has PAC introducer Plan F/u blood cultures Send and trend PCT Start empiric vanc and cefepime to cover for nosocomial infection  F/u ab cxr Will need new line   Likely baseline CKD (now acute on chronic renal failure)  Now w/ rising scr Plan Optimize CO Serial chems Will change chems to q 12 as we increased lasix   Hyponatremia, hypomagnesemia  Plan Trend  Replace Mg  GERD Plan PPI  Hx PE on AC PTA Plan Heparin   Hyperglycemia Plan Ssi   Best Practice (right click and "Reselect all SmartList Selections" daily)   Diet/type: Regular diet. DVT prophylaxis: not indicated GI prophylaxis: PPI Lines: Central line Foley:  Yes, and it is still needed Code  Status:  full code Last date of multidisciplinary goals of care discussion [per primary]  CRITICAL CARE NA  Erick Colace ACNP-BC [place] Pager # [phone] OR # [phone] if no answer

## 2022-03-18 NOTE — Progress Notes (Signed)
Pharmacy Antibiotic Note  Juan Snow is a 67 y.o. male admitted on 03/10/2022 with STEMI s/p IABP and CABG x2.  Pharmacy has been consulted for empiric vancomycin and cefepime dosing given fever (Tmax 101 F) and leukocytosis (WBC 22.4 up from 9.4).  Plan: - Initiate vancomycin 1750 mg IV x1 dose, then 1000 mg IV q24h (eAUC 515, SCr 1.49) - Initiate cefepime 2 g IV q12h - Monitor culture data and s/sx of infection for opportunities to narrow antibiotic regimen, length of therapy  Height: '5\' 7"'$  (170.2 cm) Weight: 90.8 kg (200 lb 2.8 oz) IBW/kg (Calculated) : 66.1  Temp (24hrs), Avg:98.7 F (37.1 C), Min:97.8 F (36.6 C), Max:101 F (38.3 C)  Recent Labs  Lab 03/14/22 0308 03/15/22 0404 03/15/22 1130 03/16/22 0325 03/16/22 0900 03/17/22 0340 03/18/22 0440  WBC 9.0 8.9  --  9.5  --  9.4 22.6*  CREATININE 1.33* 1.49* 1.42* 1.36* 1.35* 1.28* 1.49*    Estimated Creatinine Clearance: 53.1 mL/min (A) (by C-G formula based on SCr of 1.49 mg/dL (H)).    No Known Allergies  Antimicrobials this admission: 1/15 vancomycin >> 1/15 cefepime >>  Dose adjustments this admission: N/A  Microbiology results: 1/15 BCx: pending 1/8 COVID PCR: positive (considered incidental finding due to recent previous infection)  Thank you for allowing pharmacy to be a part of this patient's care.  Freddie Breech 03/18/2022 8:36 AM

## 2022-03-19 ENCOUNTER — Inpatient Hospital Stay (HOSPITAL_COMMUNITY): Payer: Medicare Other

## 2022-03-19 ENCOUNTER — Other Ambulatory Visit: Payer: Medicare Other

## 2022-03-19 DIAGNOSIS — J90 Pleural effusion, not elsewhere classified: Secondary | ICD-10-CM | POA: Diagnosis not present

## 2022-03-19 DIAGNOSIS — R570 Cardiogenic shock: Secondary | ICD-10-CM | POA: Diagnosis not present

## 2022-03-19 DIAGNOSIS — I2102 ST elevation (STEMI) myocardial infarction involving left anterior descending coronary artery: Secondary | ICD-10-CM | POA: Diagnosis not present

## 2022-03-19 DIAGNOSIS — I5021 Acute systolic (congestive) heart failure: Secondary | ICD-10-CM | POA: Diagnosis not present

## 2022-03-19 LAB — GLUCOSE, CAPILLARY
Glucose-Capillary: 117 mg/dL — ABNORMAL HIGH (ref 70–99)
Glucose-Capillary: 121 mg/dL — ABNORMAL HIGH (ref 70–99)
Glucose-Capillary: 122 mg/dL — ABNORMAL HIGH (ref 70–99)
Glucose-Capillary: 122 mg/dL — ABNORMAL HIGH (ref 70–99)
Glucose-Capillary: 126 mg/dL — ABNORMAL HIGH (ref 70–99)
Glucose-Capillary: 128 mg/dL — ABNORMAL HIGH (ref 70–99)
Glucose-Capillary: 95 mg/dL (ref 70–99)

## 2022-03-19 LAB — COOXEMETRY PANEL
Carboxyhemoglobin: 1.6 % — ABNORMAL HIGH (ref 0.5–1.5)
Methemoglobin: 0.8 % (ref 0.0–1.5)
O2 Saturation: 58 %
Total hemoglobin: 9.4 g/dL — ABNORMAL LOW (ref 12.0–16.0)

## 2022-03-19 LAB — BLOOD CULTURE ID PANEL (REFLEXED) - BCID2

## 2022-03-19 LAB — ALBUMIN, PLEURAL OR PERITONEAL FLUID: Albumin, Fluid: <1.5 g/dL

## 2022-03-19 LAB — BASIC METABOLIC PANEL
Anion gap: 13 (ref 5–15)
Anion gap: 15 (ref 5–15)
Anion gap: 16 — ABNORMAL HIGH (ref 5–15)
BUN: 27 mg/dL — ABNORMAL HIGH (ref 8–23)
BUN: 31 mg/dL — ABNORMAL HIGH (ref 8–23)
BUN: 40 mg/dL — ABNORMAL HIGH (ref 8–23)
CO2: 29 mmol/L (ref 22–32)
CO2: 29 mmol/L (ref 22–32)
CO2: 27 mmol/L (ref 22–32)
Calcium: 7.8 mg/dL — ABNORMAL LOW (ref 8.9–10.3)
Calcium: 8.2 mg/dL — ABNORMAL LOW (ref 8.9–10.3)
Calcium: 8.6 mg/dL — ABNORMAL LOW (ref 8.9–10.3)
Chloride: 83 mmol/L — ABNORMAL LOW (ref 98–111)
Chloride: 82 mmol/L — ABNORMAL LOW (ref 98–111)
Chloride: 85 mmol/L — ABNORMAL LOW (ref 98–111)
Creatinine, Ser: 1.56 mg/dL — ABNORMAL HIGH (ref 0.61–1.24)
Creatinine, Ser: 1.6 mg/dL — ABNORMAL HIGH (ref 0.61–1.24)
Creatinine, Ser: 1.58 mg/dL — ABNORMAL HIGH (ref 0.61–1.24)
GFR, Estimated: 49 mL/min — ABNORMAL LOW (ref >60)
GFR, Estimated: 48 mL/min — ABNORMAL LOW (ref >60)
GFR, Estimated: 48 mL/min — ABNORMAL LOW (ref >60)
Glucose, Bld: 277 mg/dL — ABNORMAL HIGH (ref 70–99)
Glucose, Bld: 148 mg/dL — ABNORMAL HIGH (ref 70–99)
Glucose, Bld: 113 mg/dL — ABNORMAL HIGH (ref 70–99)
Potassium: 2.8 mmol/L — ABNORMAL LOW (ref 3.5–5.1)
Potassium: 3 mmol/L — ABNORMAL LOW (ref 3.5–5.1)
Potassium: 3.6 mmol/L (ref 3.5–5.1)
Sodium: 125 mmol/L — ABNORMAL LOW (ref 135–145)
Sodium: 126 mmol/L — ABNORMAL LOW (ref 135–145)
Sodium: 128 mmol/L — ABNORMAL LOW (ref 135–145)

## 2022-03-19 LAB — CBC
HCT: 28.1 % — ABNORMAL LOW (ref 39.0–52.0)
Hemoglobin: 9.9 g/dL — ABNORMAL LOW (ref 13.0–17.0)
MCH: 30.9 pg (ref 26.0–34.0)
MCHC: 35.2 g/dL (ref 30.0–36.0)
MCV: 87.8 fL (ref 80.0–100.0)
Platelets: 160 10*3/uL (ref 150–400)
RBC: 3.2 MIL/uL — ABNORMAL LOW (ref 4.22–5.81)
RDW: 14.4 % (ref 11.5–15.5)
WBC: 27.7 10*3/uL — ABNORMAL HIGH (ref 4.0–10.5)
nRBC: 0.1 % (ref 0.0–0.2)

## 2022-03-19 LAB — LACTATE DEHYDROGENASE: LDH: 327 U/L — ABNORMAL HIGH (ref 98–192)

## 2022-03-19 LAB — MRSA NEXT GEN BY PCR, NASAL: MRSA by PCR Next Gen: NOT DETECTED

## 2022-03-19 LAB — BODY FLUID CELL COUNT WITH DIFFERENTIAL
Lymphs, Fluid: 12 %
Monocyte-Macrophage-Serous Fluid: 41 % — ABNORMAL LOW (ref 50–90)
Neutrophil Count, Fluid: 47 % — ABNORMAL HIGH (ref 0–25)
Total Nucleated Cell Count, Fluid: 470 cu mm (ref 0–1000)

## 2022-03-19 LAB — HEPARIN LEVEL (UNFRACTIONATED): Heparin Unfractionated: 0.32 IU/mL (ref 0.30–0.70)

## 2022-03-19 LAB — PROCALCITONIN: Procalcitonin: 7.41 ng/mL

## 2022-03-19 LAB — MAGNESIUM: Magnesium: 2.2 mg/dL (ref 1.7–2.4)

## 2022-03-19 LAB — PROTEIN, PLEURAL OR PERITONEAL FLUID: Total protein, fluid: <3 g/dL

## 2022-03-19 LAB — PROTEIN, TOTAL: Total Protein: 6.1 g/dL — ABNORMAL LOW (ref 6.5–8.1)

## 2022-03-19 LAB — LACTATE DEHYDROGENASE, PLEURAL OR PERITONEAL FLUID: LD, Fluid: 282 U/L — ABNORMAL HIGH (ref 3–23)

## 2022-03-19 MED ORDER — POTASSIUM CHLORIDE CRYS ER 20 MEQ PO TBCR
40.0000 meq | EXTENDED_RELEASE_TABLET | ORAL | Status: AC
  Administered 2022-03-19 (×2): 40 meq via ORAL
  Filled 2022-03-19 (×2): qty 2

## 2022-03-19 MED ORDER — POTASSIUM CHLORIDE CRYS ER 20 MEQ PO TBCR
20.0000 meq | EXTENDED_RELEASE_TABLET | ORAL | Status: DC
  Administered 2022-03-19: 20 meq via ORAL
  Filled 2022-03-19: qty 1

## 2022-03-19 MED ORDER — POTASSIUM CHLORIDE CRYS ER 20 MEQ PO TBCR: 40.0000 meq | EXTENDED_RELEASE_TABLET | ORAL | Status: DC

## 2022-03-19 MED ORDER — METOLAZONE 2.5 MG PO TABS: 2.5000 mg | ORAL_TABLET | Freq: Once | ORAL | Status: DC

## 2022-03-19 MED ORDER — POTASSIUM CHLORIDE CRYS ER 20 MEQ PO TBCR
40.0000 meq | EXTENDED_RELEASE_TABLET | Freq: Once | ORAL | Status: AC
  Administered 2022-03-19: 40 meq via ORAL
  Filled 2022-03-19: qty 2

## 2022-03-19 MED ORDER — METOLAZONE 2.5 MG PO TABS
2.5000 mg | ORAL_TABLET | Freq: Once | ORAL | Status: AC
  Administered 2022-03-19: 2.5 mg via ORAL
  Filled 2022-03-19: qty 1

## 2022-03-19 MED ORDER — HEPARIN (PORCINE) 25000 UT/250ML-% IV SOLN
1550.0000 [IU]/h | INTRAVENOUS | Status: DC
  Administered 2022-03-20: 1500 [IU]/h via INTRAVENOUS
  Administered 2022-03-20 – 2022-03-22 (×3): 1650 [IU]/h via INTRAVENOUS
  Administered 2022-03-22 – 2022-03-23 (×2): 1550 [IU]/h via INTRAVENOUS
  Filled 2022-03-19 (×6): qty 250

## 2022-03-19 MED ORDER — AMIODARONE HCL 200 MG PO TABS
200.0000 mg | ORAL_TABLET | Freq: Two times a day (BID) | ORAL | Status: DC
  Administered 2022-03-19 – 2022-03-24 (×11): 200 mg via ORAL
  Filled 2022-03-19 (×11): qty 1

## 2022-03-19 NOTE — Evaluation (Signed)
Physical Therapy Evaluation Patient Details Name: Juan Snow MRN: [id number] DOB: [birth year] Today's Date: 03/19/2022  History of Present Illness  Juan Snow is an 67 y.o. male who presented to [facility] on 03/10/2022 with a chief complaint of CP/STEMI. 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 1/13 balloon pump removed. 1/16 Ultrasound of left chest showing small/moderate left pleural effusion & TEE. PMHx: arthritis, OA, GERD  Clinical Impression  PTA, pt lives with his family and is independent. Pt is pleasant and agreeable to participate in therapy evaluation. Pt ambulating 160 ft with [name] walker at a min assist level, desat to 86% on 4L O2, so bumped up to 6L O2 for remainder of walk. Pt presents with decreased cardiopulmonary endurance and dynamic balance deficits. Suspect steady progress given age, PLOF and motivation. Will continue to progress mobility as tolerated.     Recommendations for follow up therapy are one component of a multi-disciplinary discharge planning process, led by the attending physician.  Recommendations may be updated based on patient status, additional functional criteria and insurance authorization.  Follow Up Recommendations Other (comment) (OP Cardiac Rehab)      Assistance Recommended at Discharge PRN  Patient can return home with the following  Assistance with cooking/housework;Assist for transportation;Help with stairs or ramp for entrance    Equipment Recommendations Other (comment) (TBA)  Recommendations for Other Services       Functional Status Assessment Patient has had a recent decline in their functional status and demonstrates the ability to make significant improvements in function in a reasonable and predictable amount of time.     Precautions / Restrictions Precautions Precautions: Fall;Sternal Precaution Comments: sternal Restrictions Weight Bearing Restrictions:  No Other Position/Activity Restrictions: sternal precautions      Mobility  Bed Mobility Overal bed mobility: Needs Assistance Bed Mobility: Sit to Supine       Sit to supine: Mod assist   General bed mobility comments: Assist to bring BLE's back into bed    Transfers Overall transfer level: Needs assistance Equipment used: Bilateral platform walker Transfers: Sit to/from Stand Sit to Stand: Min guard           General transfer comment: cues for sternal precautions    Ambulation/Gait Ambulation/Gait assistance: Min assist Gait Distance (Feet): 160 Feet Assistive device: Ethelene Hal Gait Pattern/deviations: Step-through pattern, Decreased stride length Gait velocity: decreased     General Gait Details: Mild instability, requiring minA for balance  Stairs            Wheelchair Mobility    Modified Rankin (Stroke Patients Only)       Balance Overall balance assessment: Needs assistance Sitting-balance support: Feet supported Sitting balance-Leahy Scale: Good     Standing balance support: Bilateral upper extremity supported, During functional activity Standing balance-Leahy Scale: Fair                               Pertinent Vitals/Pain Pain Assessment Pain Assessment: Faces Faces Pain Scale: Hurts a little bit Pain Location: sx site Pain Descriptors / Indicators: Grimacing Pain Intervention(s): Monitored during session    Home Living Family/patient expects to be discharged to:: Private residence Living Arrangements: Spouse/significant other;Children Available Help at Discharge: Available 24 hours/day Type of Home: House Home Access: Stairs to enter Entrance Stairs-Rails: None Entrance Stairs-Number of Steps: 4-5 Alternate Level Stairs-Number of Steps: flight Home Layout: Two  level;Bed/bath upstairs (bed and 1/2 bath on the main level) Home Equipment: Cane - single point      Prior Function Prior Level of Function :  Independent/Modified Independent;Other (comment)             Mobility Comments: no AD ADLs Comments: states he was 100% indep, retired from working in Pharmacist, hospital at [place]: Right    Extremity/Trunk Assessment   Upper Extremity Assessment Upper Extremity Assessment: Overall WFL for tasks assessed    Lower Extremity Assessment Lower Extremity Assessment: RLE deficits/detail;LLE deficits/detail RLE Deficits / Details: Strength 5/5 LLE Deficits / Details: Strength 5/5    Cervical / Trunk Assessment Cervical / Trunk Assessment: Other exceptions Cervical / Trunk Exceptions: sternectomy  Communication   Communication: No difficulties  Cognition Arousal/Alertness: Awake/alert Behavior During Therapy: WFL for tasks assessed/performed Overall Cognitive Status: Within Functional Limits for tasks assessed                                          General Comments General comments (skin integrity, edema, etc.): VSS on 4-6L O2    Exercises     Assessment/Plan    PT Assessment Patient needs continued PT services  PT Problem List Decreased strength;Decreased activity tolerance;Decreased balance;Decreased mobility;Cardiopulmonary status limiting activity       PT Treatment Interventions DME instruction;Gait training;Functional mobility training;Stair training;Therapeutic activities;Therapeutic exercise;Balance training;Patient/family education    PT Goals (Current goals can be found in the Care Plan section)  Acute Rehab PT Goals Patient Stated Goal: get stronger, more stable, walk more PT Goal Formulation: With patient Time For Goal Achievement: 04/02/22 Potential to Achieve Goals: Good    Frequency Min 3X/week     Co-evaluation               AM-PAC PT "6 Clicks" Mobility  Outcome Measure Help needed turning from your back to your side while in a flat bed without using bedrails?: A Little Help needed moving  from lying on your back to sitting on the side of a flat bed without using bedrails?: A Little Help needed moving to and from a bed to a chair (including a wheelchair)?: A Little Help needed standing up from a chair using your arms (e.g., wheelchair or bedside chair)?: A Little Help needed to walk in hospital room?: A Little Help needed climbing 3-5 steps with a railing? : A Lot 6 Click Score: 17    End of Session Equipment Utilized During Treatment: Oxygen Activity Tolerance: Patient tolerated treatment well Patient left: in bed;with call bell/phone within reach Nurse Communication: Mobility status PT Visit Diagnosis: Unsteadiness on feet (R26.81);Pain;Difficulty in walking, not elsewhere classified (R26.2) Pain - part of body:  (chest)    Time: 1497-0263 PT Time Calculation (min) (ACUTE ONLY): 16 min   Charges:   PT Evaluation $PT Eval Low Complexity: [place], PT, DPT Acute Rehabilitation Services Office [phone]   [name] 03/19/2022, 3:52 PM

## 2022-03-19 NOTE — Progress Notes (Addendum)
PHARMACY - PHYSICIAN COMMUNICATION CRITICAL VALUE ALERT - BLOOD CULTURE IDENTIFICATION (BCID)  Juan Snow is an 67 y.o. male who presented to [facility] on 03/10/2022 with a chief complaint of CP/STEMI  Assessment: 58 YOM s/p CABG x 2 on 1/9 with leukocytosis/fevers noted on 1/15 with cultures sent and now with 1 of 3 bottles growing GNR with BCID detecting Pseudomonas. Source unclear but could be urinary or pulmonary.  Name of physician (or Provider) Contacted: Chand (CCM)  Current antibiotics: Vancomycin + Cefepime  Changes to prescribed antibiotics recommended:  Narrow to Cefepime monotherapy - 2g/12h appropriate for current renal function.  Results for orders placed or performed during the hospital encounter of 03/27/21  Blood Culture ID Panel (Reflexed) (Collected: 03/27/2021  4:56 PM)  Result Value Ref Range   Enterococcus faecalis NOT DETECTED NOT DETECTED   Enterococcus Faecium NOT DETECTED NOT DETECTED   Listeria monocytogenes NOT DETECTED NOT DETECTED   Staphylococcus species NOT DETECTED NOT DETECTED   Staphylococcus aureus (BCID) NOT DETECTED NOT DETECTED   Staphylococcus epidermidis NOT DETECTED NOT DETECTED   Staphylococcus lugdunensis NOT DETECTED NOT DETECTED   Streptococcus species NOT DETECTED NOT DETECTED   Streptococcus agalactiae NOT DETECTED NOT DETECTED   Streptococcus pneumoniae NOT DETECTED NOT DETECTED   Streptococcus pyogenes NOT DETECTED NOT DETECTED   A.calcoaceticus-baumannii NOT DETECTED NOT DETECTED   Bacteroides fragilis NOT DETECTED NOT DETECTED   Enterobacterales DETECTED (A) NOT DETECTED   Enterobacter cloacae complex NOT DETECTED NOT DETECTED   Escherichia coli DETECTED (A) NOT DETECTED   Klebsiella aerogenes NOT DETECTED NOT DETECTED   Klebsiella oxytoca NOT DETECTED NOT DETECTED   Klebsiella pneumoniae NOT DETECTED NOT DETECTED   Proteus species NOT DETECTED NOT DETECTED   Salmonella species NOT DETECTED NOT DETECTED   Serratia  marcescens NOT DETECTED NOT DETECTED   Haemophilus influenzae NOT DETECTED NOT DETECTED   Neisseria meningitidis NOT DETECTED NOT DETECTED   Pseudomonas aeruginosa NOT DETECTED NOT DETECTED   Stenotrophomonas maltophilia NOT DETECTED NOT DETECTED   Candida albicans NOT DETECTED NOT DETECTED   Candida auris NOT DETECTED NOT DETECTED   Candida glabrata NOT DETECTED NOT DETECTED   Candida krusei NOT DETECTED NOT DETECTED   Candida parapsilosis NOT DETECTED NOT DETECTED   Candida tropicalis NOT DETECTED NOT DETECTED   Cryptococcus neoformans/gattii NOT DETECTED NOT DETECTED   CTX-M ESBL NOT DETECTED NOT DETECTED   Carbapenem resistance IMP NOT DETECTED NOT DETECTED   Carbapenem resistance KPC NOT DETECTED NOT DETECTED   Carbapenem resistance NDM NOT DETECTED NOT DETECTED   Carbapenem resist OXA 48 LIKE NOT DETECTED NOT DETECTED   Carbapenem resistance VIM NOT DETECTED NOT DETECTED   Thank you for allowing pharmacy to be a part of this patient's care.  Alycia Rossetti, PharmD, BCPS Infectious Diseases Clinical Pharmacist 03/19/2022 2:24 PM   **Pharmacist phone directory can now be found on [email or web address] (PW [email or web address]).  Listed under [place].

## 2022-03-19 NOTE — Progress Notes (Signed)
ANTICOAGULATION CONSULT NOTE - Follow Up Consult  Pharmacy Consult for IV heparin Indication: IABP + hx DVT/PE  No Known Allergies  Patient Measurements: Height: '5\' 7"'$  (170.2 cm) Weight: 89.7 kg (197 lb 12 oz) IBW/kg (Calculated) : 66.1 Heparin Dosing Weight: 87 kg  Vital Signs: Temp: 98.5 F (36.9 C) (01/16 0300) Temp Source: Oral (01/16 0300) BP: 109/74 (01/16 0400) Pulse Rate: 75 (01/16 0400)  Labs: Recent Labs    03/16/22 0900 03/16/22 1233 03/17/22 0340 03/17/22 1305 03/18/22 0440 03/18/22 1700 03/19/22 0426  HGB   < >  --  11.8*  --  11.1*  --  9.9*  HCT  --   --  33.5*  --  32.2*  --  28.1*  PLT  --   --  118*  --  146*  --  160  APTT  --  42*  --   --   --   --   --   HEPARINUNFRC   < >  --  0.13* 0.23* 0.38  --  0.32  CREATININE  --   --  1.28*  --  1.49* 1.70* 1.56*   < > = values in this interval not displayed.    Estimated Creatinine Clearance: 50.4 mL/min (A) (by C-G formula based on SCr of 1.56 mg/dL (H)).  Medications:  Infusions:   sodium chloride     sodium chloride Stopped (03/12/22 2101)   amiodarone 30 mg/hr (03/19/22 0400)   ceFEPime (MAXIPIME) IV Stopped (03/18/22 2200)   DOBUTamine 5 mcg/kg/min (03/19/22 0400)   furosemide (LASIX) 200 mg in dextrose 5 % 100 mL (2 mg/mL) infusion 10 mg/hr (03/19/22 0437)   heparin 1,500 Units/hr (03/19/22 0436)   lactated ringers Stopped (03/12/22 2225)   lactated ringers Stopped (03/18/22 1837)   milrinone 0.25 mcg/kg/min (03/19/22 0622)   norepinephrine (LEVOPHED) Adult infusion     vancomycin      Assessment: 67 yo male CABG, now with IABP. Also with hx PE/DVT. Pharmacy asked to begin low-dose IV heparin gtt. No overt bleeding noted. Initial heparin level checked prior to heparin initiation is undetectable and last Xarelto dose PTA 1/7.   Heparin resumed after IABP removal 1/13, targeting low goal given recent sternotomy. Heparin level therapeutic this AM at 0.32. PLT 160, Hgb 9.9. No documented  s/sx of bleeding or issues with infusion site.  Goal of Therapy:  Heparin level 0.3-0.5 units/ml Monitor platelets by anticoagulation protocol: Yes   Plan:  Heparin on hold this AM for procedure, continue after completion at rate of 1500 units/hr Recheck heparin level, platelets, hemoglobin, and s/sx of bleeding daily  Freddie Breech, PharmD Candidate 03/19/2022,6:52 AM

## 2022-03-19 NOTE — Discharge Summary (Addendum)
[address]       [place] [address]             [phone]    Physician Discharge Summary  Patient ID: Juan Snow MRN: [id number] DOB/AGE: 1955-09-04 67 y.o.  Admit date: 03/10/2022 Discharge date: 03/28/2022  Admission Diagnoses:  Patient Active Problem List   Diagnosis Date Noted   Arthritis of first MTP joint 03/21/2022   Bacteremia 03/20/2022   Cardiogenic shock ([place]) 76/28/3151   Acute systolic heart failure ([place]) 03/18/2022   CAD in native artery 03/18/2022   Pleural effusion on left 03/18/2022   STEMI (ST elevation myocardial infarction) ([place]) 03/10/2022   Acute pulmonary embolism with acute cor pulmonale (HCC) 05/11/2021   Aspiration pneumonia ([place]) 03/28/2021   Severe sepsis ([place]) 03/28/2021   AKI (acute kidney injury) ([place]) 03/27/2021   Sepsis ([place]) 03/27/2021   Hypomagnesemia 03/27/2021   Chest pain 03/27/2021   Prolonged QT interval 03/27/2021   Gall bladder stones 03/27/2021   OA (osteoarthritis) of hip 06/25/2017    Discharge Diagnoses:  Patient Active Problem List   Diagnosis Date Noted   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   Arthritis of first MTP joint 03/21/2022   Bacteremia 03/20/2022   Cardiogenic shock ([place]) 76/16/0737   Acute systolic heart failure ([place]) 03/18/2022   CAD in native artery 03/18/2022   Pleural effusion on left 03/18/2022   STEMI (ST elevation myocardial infarction) ([place]) 03/10/2022   Acute pulmonary embolism with acute cor pulmonale (HCC) 05/11/2021   Aspiration pneumonia ([place]) 03/28/2021   Severe sepsis ([place]) 03/28/2021   AKI (acute kidney injury) ([place]) 03/27/2021   Sepsis ([place]) 03/27/2021   Hypomagnesemia 03/27/2021   Chest pain 03/27/2021   Prolonged QT interval 03/27/2021   Gall bladder stones 03/27/2021   OA (osteoarthritis) of hip 06/25/2017   Discharged Condition: stable  History of Present Illness:     Juan Snow is a 67 year old male with a significant history of pulmonary embolism in March  2023 treated with catheter directed thrombolysis. He is on Xarelto and will require a washout period prior to surgical intervention. He presented to the ER yesterday with an acute onset of chest pain. The pain is described as 10/10. He was found on initial EKG to have peaked T waves but did not meet criteria for ST elevation myocardial infarction. A subsequent EKG did demonstrate an acute anterolateral ST elevation myocardial infarction. He was loaded with heparin, aspirin and received amiodarone for nonsustained ventricular tachycardia. On arrival to the emergency department pain was approximately 6/10. He was referred for emergency coronary angiography. He was found to have an ostial LAD to proximal LAD lesion in the 80% stenosis range as well as a 80% ramus intermedius lesion. An intra-aortic balloon pump was placed and his anginal symptoms resolved.  An echocardiogram has been performed and results are pending. Other significant past medical history includes osteoarthritis, hyperlipidemia and GERD.  His most recent troponin I was measured at greater than 24,000 after initial elevation of 153. He has minor leukocytosis with a WBC of 12.1 and minor thrombocytopenia with a platelet count of 144,000. Renal function is noted to be within normal limits. He has no history of diabetes or thyroid disease.  Dr. Kipp Brood reviewed the patient's studies and determined surgical intervention would provide the patient the best long term treatment. Dr. Kipp Brood reviewed the patient's treatment options as well as the risks and benefits of surgery. Juan Snow was agreeable to proceed with  surgery.   Hospital Course:  Juan Snow was admitted to [facility] and remained stable until he was brought to the operating room on 03/12/22. He underwent CABG x 2 utilizing LIMA to LAD and SVG to Ramus. He tolerated the procedure and was transferred to the SICU in stable condition with IABP in place. On POD1 he continued to  require inotropic support. The swan ganz catheter was replaced as it was not working properly. Inhaled nitric oxide and milrinone were weaned as tolerated. He remained intubated and sedated while inhaled nitric oxide was weaned. He was extubated on 32/20/25 without complication. Insulin drip was transitioned to SSI and Levemir. His preop A1C was 6.2, he is likely a prediabetic and will require outpatient follow up with his primary care physician. Milrinone was increased to improve his hemodynamics. He has a history of submassive PE and was started on low dose Heparin gtt per heart failure team. Heparin was held, IABP and chest tubes were removed without complication on POD4. Heprarin gtt was restarted until oral anticoagulation could be restarted. He began having PACs and SVT runs, Amiodarone gtt and Digoxin was started. He was hemodynamically stable on milrinone 0.375 and dobutamine 4, his swan ganz catheter was removed without complication. CXR showed left pleural effusion vs atelectasis, ultrasound showed very small pleural effusion and atelectasis. Critical care team proceeded with left sided thoracentesis which yielded 200 cc's of dark yellow fluid. He remained volume overload and aggressive diuresis with Lasix gtt and Metolazone was continued per heart failure team, potassium was supplemented. His creatinine increased to 1.56. He had leukocytosis and a low grade fever, blood cultures were ordered and showed Pseudomonas, his central line was replaced and Cefepime and Vancomycin were started. His swan and introducer were removed for a line holiday. Infectious disease consult was obtained for ABX guidance, he was started on Meropenem but this was changed back to Cefepime. Leukocytosis began trending down. He was deconditioned so PT/OT was consulted and recommended outpatient cardiac rehab. Atrial ectopy improved, he was transitioned to oral Amiodarone '200mg'$  BID. Oxygen was weaned as tolerated. The patient's  heparin was held and his EPW were removed. He was restarted on Eliquis therapy for previous history of PE diagnosed in March of 2023. IV lasix and Digoxin were discontinued due to acute kidney injury with a max creatinine of 3.48 which began trending down to 1.83 on 01/22. Dobutamine was weaned as tolerated. He was volume overload and started on PO Lasix. He was felt stable for transfer to the progressive unit. Limited echocardiogram on 03/25/22 showed EF 25-30%. Spironolactone and Wilder Glade were started per the Heart Failure team, Lasix was continued PRN. He was ambulating well and incisions were healing well without sign of infection. He was felt stable for discharge.     Consults: ID, Critical Care, Heart Failure  Significant Diagnostic Studies:  IABP Insertion  LEFT HEART CATH AND CORONARY ANGIOGRAPHY     Ost LAD to Prox LAD lesion is 80% stenosed.   Ramus lesion is 80% stenosed.   1.  Successful intra-aortic balloon pump placement activated at 1:1 with resolution of unstable anginal symptoms. 2.  Unchanged burden of left-sided disease with TIMI-3 flow in all vessels.  ECHOCARDIOGRAM REPORT    Patient Name:   LINFORD QUINTELA Date of Exam: 03/11/2022 Medical Rec #:  [id number]       Height:       67.0 in Accession #:    [id number]      Weight:  205.5 lb Date of Birth:  [birth year]        BSA:          2.046 m Patient Age:    29 years        BP:           117/95 mmHg Patient Gender: M               HR:           78 bpm. Exam Location:  Inpatient  Procedure: 2D Echo, Cardiac Doppler, Color Doppler and Intracardiac            Opacification Agent  Indications:    Acute myocardial infarction, unspecified I21.9   History:        Patient has prior history of Echocardiogram examinations, most                 recent 08/15/2021. Risk Factors:GERD.   Sonographer:    Bernadene Person RDCS Referring Phys: [id number] [place]    1. Left ventricular ejection fraction, by  estimation, is 25 to 30%. The left ventricle has severely decreased function. The left ventricle demonstrates regional wall motion abnormalities (see scoring diagram/findings for description). Left ventricular diastolic parameters are consistent with Grade II diastolic dysfunction (pseudonormalization).  2. Right ventricular systolic function is normal. The right ventricular size is normal. There is mildly elevated pulmonary artery systolic pressure.  3. The mitral valve is normal in structure. Trivial mitral valve regurgitation. No evidence of mitral stenosis.  4. The aortic valve is tricuspid. Aortic valve regurgitation is not visualized. No aortic stenosis is present.  5. The inferior vena cava is normal in size with greater than 50% respiratory variability, suggesting right atrial pressure of 3 mmHg.  Comparison(s): Prior images reviewed side by side. The left ventricular function is significantly worse. The right ventricular systolic function has improved.  FINDINGS  Left Ventricle: Left ventricular ejection fraction, by estimation, is 25 to 30%. The left ventricle has severely decreased function. The left ventricle demonstrates regional wall motion abnormalities. Definity contrast agent was given IV to delineate the left ventricular endocardial borders. The left ventricular internal cavity size was normal in size. There is no left ventricular hypertrophy. Left ventricular diastolic parameters are consistent with Grade II diastolic dysfunction (pseudonormalization).    LV Wall Scoring: The mid and distal anterior wall, mid and distal lateral wall, mid and distal anterior septum, entire apex, mid anterolateral segment, and mid inferoseptal segment are akinetic.  Right Ventricle: The right ventricular size is normal. No increase in right ventricular wall thickness. Right ventricular systolic function is normal. There is mildly elevated pulmonary artery systolic pressure.  The tricuspid regurgitant velocity is 2.86  m/s, and with an assumed right atrial pressure of 8 mmHg, the estimated right ventricular systolic pressure is 67.2 mmHg.  Left Atrium: Left atrial size was normal in size.  Right Atrium: Right atrial size was normal in size.  Pericardium: There is no evidence of pericardial effusion.  Mitral Valve: The mitral valve is normal in structure. Trivial mitral valve regurgitation. No evidence of mitral valve stenosis.  Tricuspid Valve: The tricuspid valve is normal in structure. Tricuspid valve regurgitation is trivial. No evidence of tricuspid stenosis.  Aortic Valve: The aortic valve is tricuspid. Aortic valve regurgitation is not visualized. No aortic stenosis is present.  Pulmonic Valve: The pulmonic valve was normal in structure. Pulmonic valve regurgitation is not visualized. No evidence of pulmonic stenosis.  Aorta: The aortic  root is normal in size and structure.  Venous: The inferior vena cava is normal in size with greater than 50% respiratory variability, suggesting right atrial pressure of 3 mmHg.  IAS/Shunts: No atrial level shunt detected by color flow Doppler.    LEFT VENTRICLE PLAX 2D LVIDd:         5.10 cm      Diastology LVIDs:         3.20 cm      LV e' medial:    4.22 cm/s LV PW:         1.10 cm      LV E/e' medial:  16.0 LV IVS:        1.10 cm      LV e' lateral:   6.24 cm/s LVOT diam:     2.00 cm      LV E/e' lateral: 10.8 LV SV:         39 LV SV Index:   19 LVOT Area:     3.14 cm   LV Volumes (MOD) LV vol d, MOD A2C: 95.3 ml LV vol d, MOD A4C: 124.0 ml LV vol s, MOD A2C: 71.4 ml LV vol s, MOD A4C: 84.8 ml LV SV MOD A2C:     23.9 ml LV SV MOD A4C:     124.0 ml LV SV MOD BP:      31.2 ml  RIGHT VENTRICLE RV S prime:     6.41 cm/s TAPSE (M-mode): 1.2 cm  LEFT ATRIUM             Index        RIGHT ATRIUM           Index LA diam:        4.30 cm 2.10 cm/m   RA Area:     12.10 cm LA Vol (A2C):   53.7 ml  26.25 ml/m  RA Volume:   28.70 ml  14.03 ml/m LA Vol (A4C):   46.9 ml 22.93 ml/m LA Biplane Vol: 53.8 ml 26.30 ml/m  AORTIC VALVE LVOT Vmax:   82.80 cm/s LVOT Vmean:  50.600 cm/s LVOT VTI:    0.124 m   AORTA Ao Root diam: 3.40 cm Ao Asc diam:  3.50 cm  MITRAL VALVE               TRICUSPID VALVE MV Area (PHT): 4.68 cm    TR Peak grad:   32.7 mmHg MV Decel Time: 162 msec    TR Vmax:        286.00 cm/s MV E velocity: 67.70 cm/s MV A velocity: 36.90 cm/s  SHUNTS MV E/A ratio:  1.83        Systemic VTI:  0.12 m                            Systemic Diam: 2.00 cm  Candee Furbish MD Electronically signed by Candee Furbish MD Signature Date/Time: 03/11/2022/11:21:20 AM       Final     Treatments: surgery:  03/12/2022 Patient:  Worthy Keeler Pre-Op Dx: Darral Dash coronary artery disease                         History of a STEMI                         Congestive heart failure with an EF of 20 to  25%                         History of intra-aortic balloon pump placement Post-op Dx: Same Procedure: CABG X 2.  LIMA to LAD, reverse saphenous vein graft to ramus intermedius Endoscopic greater saphenous vein harvest on the right     Surgeon and Role:      * Lightfoot, Lucile Crater, MD - Primary    *B. Samik Balkcom, PA-C - assisting An experienced assistant was required given the complexity of this surgery and the standard of surgical care. The assistant was needed for exposure, dissection, suctioning, retraction of delicate tissues and sutures, instrument exchange and for overall help during this procedure.     Discharge Exam: Blood pressure 117/81, pulse 74, temperature 98.3 F (36.8 C), temperature source Oral, resp. rate 20, height '5\' 7"'$  (1.702 m), weight 83.4 kg, SpO2 96 %. General appearance: alert, cooperative, and no distress Neurologic: intact Heart: regular rate and rhythm, S1, S2 normal, no murmur, click, rub or gallop Lungs: clear to auscultation bilaterally Abdomen: soft,  non-tender; bowel sounds normal; no masses,  no organomegaly Extremities: edema trace Wound: Clean and dry, no erythema or sign of infection   Discharge Medications:  The patient has been discharged on:   1.Beta Blocker:  Yes [   ]                              No   [  x ]                              If No, reason: Hypotension  2.Ace Inhibitor/ARB: Yes [   ]                                     No  [  x  ]                                     If No, reason: Hypotension  3.Statin:   Yes [ x  ]                  No  [   ]                  If No, reason:  4.Ecasa:  Yes  [  x ]                  No   [   ]                  If No, reason:  Patient had ACS upon admission: Yes  Plavix/P2Y12 inhibitor: Yes [   ]                                      No  [  x ] On Eliquis for hx of PE     Discharge Instructions     Amb Referral to Cardiac Rehabilitation   Complete by: As directed    Diagnosis:  CABG STEMI     CABG X ___: 2   After initial  evaluation and assessments completed: Virtual Based Care may be provided alone or in conjunction with Phase 2 Cardiac Rehab based on patient barriers.: Yes   Intensive Cardiac Rehabilitation (ICR) [place] location only OR Traditional Cardiac Rehabilitation (TCR) *If criteria for ICR are not met will enroll in TCR [facility] only): Yes      Allergies as of 03/28/2022   No Known Allergies      Medication List     STOP taking these medications    Xarelto Starter Pack Generic drug: Rivaroxaban Stater Pack (15 mg and 20 mg)       TAKE these medications    amiodarone 200 MG tablet Commonly known as: PACERONE Take 1 tablet (200 mg total) by mouth daily. Start taking on: March 29, 2022   apixaban 5 MG Tabs tablet Commonly known as: ELIQUIS Take 1 tablet (5 mg total) by mouth 2 (two) times daily. What changed:  how much to take additional instructions   aspirin EC 81 MG tablet Take 1 tablet (81 mg total) by mouth daily. Swallow whole.    atorvastatin 80 MG tablet Commonly known as: LIPITOR Take 1 tablet (80 mg total) by mouth daily.   ciprofloxacin 750 MG tablet Commonly known as: CIPRO Take 1 tablet (750 mg total) by mouth 2 (two) times daily.   CO Q-10 PO Take 1 capsule by mouth daily.   famotidine-calcium carbonate-magnesium hydroxide 10-800-165 MG chewable tablet Commonly known as: PEPCID COMPLETE Chew 1-2 tablets by mouth 2 (two) times daily as needed (acid reflux).   Farxiga 10 MG Tabs tablet Generic drug: dapagliflozin propanediol Take 1 tablet (10 mg total) by mouth daily. Start taking on: March 29, 2022   Fish Oil 1000 MG Caps Take 1 capsule (1,000 mg total) by mouth daily.   furosemide 40 MG tablet Commonly known as: Lasix Take 1 tablet (40 mg total) by mouth as needed (Take one tablet if weight gain of 3 pounds overnight or greater than 5 pounds in 1 week).   OVER THE COUNTER MEDICATION Take 15 mLs by mouth daily. Black Seed Oil.   oxyCODONE 5 MG immediate release tablet Commonly known as: Oxy IR/ROXICODONE Take 1 tablet (5 mg total) by mouth every 6 (six) hours as needed for severe pain.   spironolactone 25 MG tablet Commonly known as: ALDACTONE Take 0.5 tablets (12.5 mg total) by mouth daily. Start taking on: March 29, 2022   VITAMIN C PO Take 1 tablet by mouth daily.   VITAMIN D-3 PO Take 1 tablet by mouth daily.   VITAMIN K2 PO Take 1 tablet by mouth daily.               Durable Medical Equipment  (From admission, onward)           Start     Ordered   03/26/22 1047  For home use only DME Shower stool  Once        03/26/22 1046   03/26/22 0728  For home use only DME 4 wheeled rolling walker with seat  Once       Question Answer Comment  Patient needs a walker to treat with the following condition Physical deconditioning   Patient needs a walker to treat with the following condition S/P CABG x 2      03/26/22 4496            Follow-up Information      Lajuana Matte, MD Follow up on 04/05/2022.   Specialty: Cardiothoracic Surgery Why:  Virtual appointment is at 2:10PM. Do NOT come to the office as this is a VIRTUAL appointment. Dr. Kipp Brood will call you. Contact information: [place] [address] [phone]         Richardson Dopp T, PA-C Follow up on 04/12/2022.   Specialties: Cardiology, Physician Assistant Why: Cardiology follow up at 8:25AM Contact information: [place]. [address] [place] [address] [phone]         Lurline Del, DO. Schedule an appointment as soon as possible for a visit.   Specialty: Family Medicine Why: For prediabetes workup and surveillance. Preop A1C 6.2 Contact information: [place] [address] [place]. Follow up.   Why: Latricia Heft) -[place] will set up appointment Contact information: [place] [address] [address] Follow up on 04/05/2022.   Specialty: Cardiology Why: at 3:00 Entr[facility] Contact information: [address] [id number] [place] [phone]                Signed:  Magdalene River, PA-C  03/28/2022, 2:48 PM

## 2022-03-19 NOTE — Procedures (Signed)
Thoracentesis  Procedure Note  Juan Snow  [id number]  10/12/1955  Date:03/19/22  Time:11:17 AM   Provider Performing:Fran Neiswonger   Procedure: Thoracentesis with imaging guidance ([id number])  Indication(s) Pleural Effusion  Consent Risks of the procedure as well as the alternatives and risks of each were explained to the patient and/or caregiver.  Consent for the procedure was obtained and is signed in the bedside chart  Anesthesia Topical only with 1% lidocaine    Time Out Verified patient identification, verified procedure, site/side was marked, verified correct patient position, special equipment/implants available, medications/allergies/relevant history reviewed, required imaging and test results available.   Sterile Technique Maximal sterile technique including full sterile barrier drape, hand hygiene, sterile gown, sterile gloves, mask, hair covering, sterile ultrasound probe cover (if used).  Procedure Description Ultrasound was used to identify appropriate pleural anatomy for placement and overlying skin marked.  Area of drainage cleaned and draped in sterile fashion. Lidocaine was used to anesthetize the skin and subcutaneous tissue.  200 cc's of Dark yellow appearing fluid was drained from the left pleural space. Catheter then removed and bandaid applied to site.   Complications/Tolerance None; patient tolerated the procedure well. Chest X-ray is ordered to confirm no post-procedural complication.   EBL Minimal   Specimen(s) Pleural fluid

## 2022-03-19 NOTE — Progress Notes (Signed)
T[facility] Evening Rounds:  Hemodynamically stable in NSR. Milrinone 0.125 and dobut 5. Sats 96% 5L. Had left thoracentesis today but only 200 cc fluid removed. CXR afterwards looks the same with bilateral LL atelectasis.  Diuresing with lasix drip. -1200 cc today. Replacing K+  BMET pending this evening.  He has been afebrile but WBC trending up and + BC for pseudomonas. On vanc and Maxepime per CCM. Would check urine.

## 2022-03-19 NOTE — Progress Notes (Addendum)
NAME:  Juan Snow, MRN:  [id number], DOB:  [birth year], LOS: 7 ADMISSION DATE:  03/10/2022, CONSULTATION DATE:  03/12/22 REFERRING MD:  Kipp Brood, CHIEF COMPLAINT:  CABG   History of Present Illness:  67 year old man presented with chest pain on 03/10/22 found to have STEMI and severe 2V CAD, IABP placed with improvement in anginal symptoms and he was seen by cardiothoracic surgery.  Underwent CABG x 2 today (LIMA-LAD, SVG-ramus) with intra-op course complicated by difficulty weaning off bypass due to high PA pressures and low LV function; with iNO and high dose inotropes+pressors, able to be eventually weaned off pump.  Brought to ICU on pressors, inotropes, iNO, IABP, vent.  PCCM consulted to assist with management.   Pertinent  Medical History  GERD Prior VTE on [facility] PTA Arthtritis  Significant Hospital Events: Including procedures, antibiotic start and stop dates in addition to other pertinent events   1/7 admit 1/9 CABG x 2, saphenous graft harvest 1/10 weaning inotropic and nitric oxide support 1/11 extubated  11/13 intracardiac balloon pump removed. 1/15 worsening leukocytosis. Vanc and cefepime started. Cultures sent  1/16 multiple electrolyte issues, requiring aggressive potassium replacement.  Still on Lasix drip.  Creatinine stable.  Procalcitonin remains elevated.  Ultrasound of left chest showing small/moderate left pleural effusion with plan to proceed with thoracentesis and remove left triple-lumen catheter for line holiday.  Interim History / Subjective:  No distress  Objective   Blood pressure 97/82, pulse 94, temperature 98.6 F (37 C), temperature source Oral, resp. rate (!) 34, height '5\' 7"'$  (1.702 m), weight 92.1 kg, SpO2 94 %. PAP: (30-53)/(12-26) 39/20 CVP:  [5 mmHg-39 mmHg] 14 mmHg CO:  [3.3 L/min-4.4 L/min] 3.9 L/min CI:  [1.7 L/min/m2-2.2 L/min/m2] 1.9 L/min/m2      Intake/Output Summary (Last 24 hours) at 03/17/2022 1430 Last data filed at 03/17/2022  1400 Gross per 24 hour  Intake 1537.22 ml  Output 2032 ml  Net -494.78 ml    Filed Weights   03/15/22 0500 03/16/22 0645 03/17/22 0600  Weight: 90.7 kg 90.5 kg 92.1 kg    Examination: General overall well-nourished 67 year old male patient he is sitting up in the chair today he is in no acute distress this morning HEENT normocephalic atraumatic no jugular venous distention appreciated Pulmonary: Clear to auscultation diminished bases.  Bedside ultrasound of left chest continues to demonstrate small/moderate free-flowing left pleural effusion Cardiac: Regular rate and rhythm Abdomen: Soft nontender no organomegaly Extremities: Trace lower extremity edema Neuro: Awake oriented no focal deficits appreciated.  Resolved issues     Assessment & Plan:   Post cardiotomy shock state - appears largely cardiogenic at this time.  STEMI s/p 2V CABG Plan Cont multi-modal pain rx IS  Mobilize  Lasix gtt per AHF team (currently 10 mg an hour) Cont Dobutamine (decrease to 0.25) , Milrinone (decrease to 0.'125mg'$ ) & digoxin  Amiodarone gtt  Small left pleural effusion Plan Diagnostic left thoracentesis today  Leukocytosis with elevated procalcitonin -blood cultures sent, -still has PAC introducer Plan Day #2 cefepime and vancomycin Check MRSA PCR Remove central line, give line holiday Left thoracentesis  Likely baseline CKD (now acute on chronic renal failure)  Creatinine stabilizing Plan Continuing Lasix Repeating 2.5 mg metolazone  Hyponatremia, hypomagnesemia hypokalemia -Sodium continues to fall.  There is a large discrepancy between his serum and capillary blood glucoses Plan Replace potassium  continue to trend chemistries Were planning on removing central line, will be interesting to see what glucose and sodium look like  when drawn solely from peripheral sites  GERD Plan PPI  Hx PE on AC PTA Plan Heparin, will hold for  thoracentesis  Hyperglycemia Plan Continuing sliding scale insulin and current levemir  Best Practice (right click and "Reselect all SmartList Selections" daily)   Diet/type: Regular diet. DVT prophylaxis: not indicated GI prophylaxis: PPI Lines: Central line Foley:  Yes, and it is still needed Code Status:  full code Last date of multidisciplinary goals of care discussion [per primary]  CRITICAL CARE NA  Erick Colace ACNP-BC [place] Pager # [phone] OR # [phone] if no answer

## 2022-03-19 NOTE — Evaluation (Signed)
Occupational Therapy Evaluation Patient Details Name: Juan Snow MRN: [id number] DOB: 09-Dec-1955 Today's Date: 03/19/2022   History of Present Illness Juan Snow is an 67 y.o. male who presented to [facility] on 03/10/2022 with a chief complaint of CP/STEMI. 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 1/13 balloon pump removed. 1/16 Ultrasound of left chest showing small/moderate left pleural effusion & TEE. PMHx: arthritis, OA, GERD   Clinical Impression   Juan Snow was evaluated s/p the above admission list, he is indep at baseline and lives with family who can assist as needed at d/c. Upon evaluation pt was limited by expected surgical pain, knowledge of sternal precautions and compensatory techniques, decreased activity tolerance, cardiopulmonary endurance and unsteady gait. Overall he required min G for bed mobility and simple transfers with Juan Snow walker. VSS on 4-6L [name], HR 80s-100s. Due to the deficits listed below, he requires up to mod A for LB ADLs. OT to continue to follow acutely and anticipate great progress. Recommend d/c with support of family and cardiac rehab.      Recommendations for follow up therapy are one component of a multi-disciplinary discharge planning process, led by the attending physician.  Recommendations may be updated based on patient status, additional functional criteria and insurance authorization.   Follow Up Recommendations  Other (comment) (OP cardiac rehab)     Assistance Recommended at Discharge Frequent or constant Supervision/Assistance  Patient can return home with the following A little help with walking and/or transfers;A little help with bathing/dressing/bathroom;Assistance with cooking/housework;Assist for transportation;Help with stairs or ramp for entrance    Functional Status Assessment  Patient has had a recent decline in their functional status and demonstrates the ability to  make significant improvements in function in a reasonable and predictable amount of time.  Equipment Recommendations  BSC/3in1;Other (comment) (RW vs. rollator)       Precautions / Restrictions Precautions Precautions: Fall;Sternal Precaution Comments: sternal Restrictions Weight Bearing Restrictions: No Other Position/Activity Restrictions: sternal precautions      Mobility Bed Mobility Overal bed mobility: Needs Assistance Bed Mobility: Rolling, Sidelying to Sit Rolling: Supervision Sidelying to sit: Supervision            Transfers Overall transfer level: Needs assistance Equipment used: Bilateral platform walker Transfers: Sit to/from Stand Sit to Stand: Min guard           General transfer comment: cues for sternal precautions      Balance Overall balance assessment: Needs assistance Sitting-balance support: Feet supported Sitting balance-Leahy Scale: Good     Standing balance support: Bilateral upper extremity supported, During functional activity Standing balance-Leahy Scale: Fair                             ADL either performed or assessed with clinical judgement   ADL Overall ADL's : Needs assistance/impaired Eating/Feeding: Independent;Sitting   Grooming: Set up;Sitting   Upper Body Bathing: Set up;Supervision/ safety;Sitting   Lower Body Bathing: Sit to/from stand;Moderate assistance   Upper Body Dressing : Sitting;Minimal assistance Upper Body Dressing Details (indicate cue type and reason): for sternal precautions Lower Body Dressing: Moderate assistance;Sit to/from stand   Toilet Transfer: Min guard;Ambulation;Comfort height toilet Toilet Transfer Details (indicate cue type and reason): with eva Toileting- Clothing Manipulation and Hygiene: Min guard;Sit to/from stand       Functional mobility during ADLs: Min guard General ADL Comments: cues for sternal  precautions throughout     Vision Baseline Vision/History: 0 No  visual deficits Vision Assessment?: No apparent visual deficits     Perception Perception Perception Tested?: No   Praxis Praxis Praxis tested?: Not tested    Pertinent Vitals/Pain Pain Assessment Pain Assessment: Faces Faces Pain Scale: Hurts a little bit Pain Location: sx site Pain Descriptors / Indicators: Grimacing Pain Intervention(s): Limited activity within patient's tolerance, Monitored during session     Hand Dominance Right   Extremity/Trunk Assessment Upper Extremity Assessment Upper Extremity Assessment: Overall WFL for tasks assessed       Cervical / Trunk Assessment Cervical / Trunk Assessment: Other exceptions Cervical / Trunk Exceptions: sternectomy   Communication Communication Communication: No difficulties   Cognition Arousal/Alertness: Awake/alert Behavior During Therapy: WFL for tasks assessed/performed Overall Cognitive Status: Within Functional Limits for tasks assessed                                       General Comments  VSS on 4-6L O2, family present and supportive            Home Living Family/patient expects to be discharged to:: Private residence Living Arrangements: Spouse/significant other;Children Available Help at Discharge: Available 24 hours/day Type of Home: House Home Access: Stairs to enter Technical [place] of Steps: 4-5 Entrance Stairs-Rails: None Home Layout: Two level;Bed/bath upstairs (bed and 1/2 bath on the main level) Alternate Level Stairs-Number of Steps: flight Alternate Level Stairs-Rails: Right;Left           Home Equipment: Cane - single point          Prior Functioning/Environment Prior Level of Function : Independent/Modified Independent;Other (comment)             Mobility Comments: no AD ADLs Comments: states he was 100% indep        OT Problem List: Decreased strength;Decreased range of motion;Decreased activity tolerance;Impaired balance (sitting and/or  standing);Decreased knowledge of precautions;Decreased knowledge of use of DME or AE;Decreased safety awareness;Cardiopulmonary status limiting activity;Pain      OT Treatment/Interventions: Self-care/ADL training;Therapeutic exercise;DME and/or AE instruction;Therapeutic activities;Patient/family education;Balance training    OT Goals(Current goals can be found in the care plan section) Acute Rehab OT Goals Patient Stated Goal: back to indep OT Goal Formulation: With patient Time For Goal Achievement: 04/02/22 Potential to Achieve Goals: Good ADL Goals Pt Will Perform Grooming: with supervision;standing Pt Will Perform Upper Body Dressing: with modified independence;sitting Pt Will Perform Lower Body Dressing: with min guard assist;sit to/from stand Pt Will Transfer to Toilet: with supervision;ambulating Additional ADL Goal #1: Pt will indep maintain sternal precautions 100% of the session  OT Frequency: Min 2X/week       AM-PAC OT "6 Clicks" Daily Activity     Outcome Measure Help from another person eating meals?: None Help from another person taking care of personal grooming?: A Little Help from another person toileting, which includes using toliet, bedpan, or urinal?: A Little Help from another person bathing (including washing, rinsing, drying)?: A Lot Help from another person to put on and taking off regular upper body clothing?: A Little Help from another person to put on and taking off regular lower body clothing?: A Lot 6 Click Score: 17   End of Session Equipment Utilized During Treatment: Gait belt;Other (comment) (eva) Nurse Communication: Mobility status  Activity Tolerance: Patient tolerated treatment well Patient left: Other (comment) (direct hand off to PT)  OT Visit Diagnosis: Unsteadiness on feet (R26.81);Other abnormalities of gait and mobility (R26.89);Muscle weakness (generalized) (M62.81);Pain                Time: 1413-1435 OT Time Calculation (min): 22  min Charges:  OT General Charges $OT Visit: 1 Visit OT Evaluation $OT Eval Moderate Complexity: 1 Mod   Darionna Banke D Causey 03/19/2022, 2:59 PM

## 2022-03-19 NOTE — Progress Notes (Signed)
[address]       [place] [address]             [phone]      7 Days Post-Op  Procedure(s) (LRB): CORONARY ARTERY BYPASS GRAFTING (CABG) X TWO BYPASSES USING LEFT INTERNAL MAMMARY ARTERY AND RIGHT GREATER SAPHENOUS  VEIN HARVEST. (N/A) TRANSESOPHAGEAL ECHOCARDIOGRAM (TEE) (N/A)  Total Length of Stay:  LOS: 9 days   SUBJECTIVE: Feels better today Ambulated. Tolerating diet  Vitals:   03/19/22 0743 03/19/22 0800  BP:  108/74  Pulse: 78 76  Resp: (!) 29 (!) 22  Temp: 98.2 F (36.8 C)   SpO2: 93% 95%    Intake/Output      01/15 0701 01/16 0700 01/16 0701 01/17 0700   I.V. (mL/kg) 1422.2 (15.9) 49.7 (0.6)   IV Piggyback 650.1    Total Intake(mL/kg) 2072.3 (23.1) 49.7 (0.6)   Urine (mL/kg/hr) 3725 (1.7)    Stool     Total Output 3725    Net -1652.8 +49.7            sodium chloride     sodium chloride Stopped (03/12/22 2101)   ceFEPime (MAXIPIME) IV Stopped (03/18/22 2200)   DOBUTamine 5 mcg/kg/min (03/19/22 0800)   furosemide (LASIX) 200 mg in dextrose 5 % 100 mL (2 mg/mL) infusion 10 mg/hr (03/19/22 0800)   lactated ringers Stopped (03/12/22 2225)   lactated ringers Stopped (03/18/22 1837)   milrinone 0.125 mcg/kg/min (03/19/22 0800)   norepinephrine (LEVOPHED) Adult infusion     vancomycin      CBC    Component Value Date/Time   WBC 27.7 (H) 03/19/2022 0426   RBC 3.20 (L) 03/19/2022 0426   HGB 9.9 (L) 03/19/2022 0426   HCT 28.1 (L) 03/19/2022 0426   PLT 160 03/19/2022 0426   MCV 87.8 03/19/2022 0426   MCH 30.9 03/19/2022 0426   MCHC 35.2 03/19/2022 0426   RDW 14.4 03/19/2022 0426   LYMPHSABS 1.3 03/10/2022 1155   MONOABS 1.3 (H) 03/10/2022 1155   EOSABS 0.1 03/10/2022 1155   BASOSABS 0.0 03/10/2022 1155   CMP     Component Value Date/Time   NA 125 (L) 03/19/2022 0426   K 2.8 (L) 03/19/2022 0426   CL 83 (L) 03/19/2022 0426   CO2 29 03/19/2022 0426   GLUCOSE 277 (H) 03/19/2022 0426   BUN 27 (H) 03/19/2022 0426    CREATININE 1.56 (H) 03/19/2022 0426   CALCIUM 7.8 (L) 03/19/2022 0426   PROT 7.0 03/10/2022 1155   ALBUMIN 3.7 03/10/2022 1155   AST 26 03/10/2022 1155   ALT 24 03/10/2022 1155   ALKPHOS 48 03/10/2022 1155   BILITOT 0.5 03/10/2022 1155   GFRNONAA 49 (L) 03/19/2022 0426   GFRAA >60 06/26/2017 0529   ABG    Component Value Date/Time   PHART 7.357 03/12/2022 1724   PCO2ART 34.4 03/12/2022 1724   PO2ART 164 (H) 03/12/2022 1724   HCO3 19.4 (L) 03/12/2022 1724   TCO2 20 (L) 03/12/2022 1724   ACIDBASEDEF 6.0 (H) 03/12/2022 1724   O2SAT 58 03/19/2022 0420   CBG (last 3)  Recent Labs    03/18/22 2357 03/19/22 0328 03/19/22 0746  GLUCAP 126* 121* 122*  EXAM Lungs: decreased at bases Card: rr Incisions clean and dry. No signs of infection Ext: still with edema Neuro: alert and no focal deficits   ASSESSMENT: POD#7 SP CABG Hemodynamics stable on Milrinone (lower dose) and Dobutamine. Coox 58. Will continue present inotropes  and follow Co[place] Pulmonary: For thorocenteses ID; WBC up. For change of neck line once off heparin     Coralie Common, MD 03/19/2022

## 2022-03-19 NOTE — Progress Notes (Addendum)
Patient ID: Juan Snow, male   DOB: 1955/10/24, 67 y.o.   MRN: [id number]   Advanced Heart Failure Rounding Note  PCP-Cardiologist: None   Subjective:    S/p CABG x 2   POD #7   Currently on 5 DBA + 0.25 Milrinone. IABP removed 01/13.  CO-OX 58%  3.7L UOP yesterday with lasix gtt at 10/hr and metolazone 2.5 mg. Weight down another 3 lb.  Scr 1.56, K 2.8, Na 125  WBC continues to climb, 9.4>22.6>28K. Procal 7.12>7.41. Now on empiric abx with vanc and cefepime.  SWAN out but has R IJ introducer.  Feeling better today. Has already ambulated a lap around the unit. Denies dyspnea.   Objective:   Weight Range: 89.7 kg Body mass index is 30.97 kg/m.   Vital Signs:   Temp:  [97.9 F (36.6 C)-101 F (38.3 C)] 98.5 F (36.9 C) (01/16 0300) Pulse Rate:  [75-99] 75 (01/16 0400) Resp:  [0-42] 9 (01/16 0400) BP: (88-118)/(51-96) 109/74 (01/16 0400) SpO2:  [90 %-97 %] 95 % (01/16 0400) Weight:  [89.7 kg] 89.7 kg (01/16 0644) Last BM Date : 03/18/22  Weight change: Filed Weights   03/17/22 0600 03/18/22 0600 03/19/22 0644  Weight: 92.1 kg 90.8 kg 89.7 kg    Intake/Output:   Intake/Output Summary (Last 24 hours) at 03/19/2022 0703 Last data filed at 03/19/2022 0645 Gross per 24 hour  Intake 1922.95 ml  Output 3725 ml  Net -1802.05 ml      Physical Exam    General:  Sitting up in chair. No distress. HEENT: normal Neck: supple. Jvp 10-12. Carotids 2+ bilat; no bruits. R IJ. Cor: PMI nondisplaced. Regular rate & rhythm. No rubs, gallops or murmurs. Lungs: diminished in bases Abdomen: soft, nontender, + mildly distended.  Extremities: no cyanosis, clubbing, rash, 1+ edema, + TED hose Neuro: alert & orientedx3, cranial nerves grossly intact. moves all 4 extremities w/o difficulty. Affect pleasant     Telemetry   SR 70s-80s, ~ 1 run slow NSVT yesterday evening around 5 pm  Labs    CBC Recent Labs    03/18/22 0440 03/19/22 0426  WBC 22.6* 27.7*  HGB  11.1* 9.9*  HCT 32.2* 28.1*  MCV 89.2 87.8  PLT 146* 570   Basic Metabolic Panel Recent Labs    03/16/22 0900 03/17/22 0340 03/18/22 0440 03/18/22 1700 03/19/22 0426  NA 129*   < > 130* 128* 125*  K 4.6   < > 4.5 3.6 2.8*  CL 91*   < > 91* 88* 83*  CO2 27   < > '30 25 29  '$ GLUCOSE 135*   < > 104* 193* 277*  BUN 18   < > 20 24* 27*  CREATININE 1.35*   < > 1.49* 1.70* 1.56*  CALCIUM 8.3*   < > 8.1* 7.6* 7.8*  MG 2.1  --  1.7  --   --    < > = values in this interval not displayed.   Liver Function Tests No results for input(s): "AST", "ALT", "ALKPHOS", "BILITOT", "PROT", "ALBUMIN" in the last 72 hours.  No results for input(s): "LIPASE", "AMYLASE" in the last 72 hours. Cardiac Enzymes No results for input(s): "CKTOTAL", "CKMB", "CKMBINDEX", "TROPONINI" in the last 72 hours.  BNP: BNP (last 3 results) Recent Labs    05/11/21 1052  BNP 23.1    ProBNP (last 3 results) No results for input(s): "PROBNP" in the last 8760 hours.   D-Dimer No results for input(s): "DDIMER" in the  last 72 hours. Hemoglobin A1C No results for input(s): "HGBA1C" in the last 72 hours.  Fasting Lipid Panel No results for input(s): "CHOL", "HDL", "LDLCALC", "TRIG", "CHOLHDL", "LDLDIRECT" in the last 72 hours.  Thyroid Function Tests No results for input(s): "TSH", "T4TOTAL", "T3FREE", "THYROIDAB" in the last 72 hours.  Invalid input(s): "FREET3"  Other results:   Imaging    DG CHEST PORT 1 VIEW  Result Date: 03/18/2022 CLINICAL DATA:  Leukocytosis EXAM: PORTABLE CHEST 1 VIEW COMPARISON:  Portable exam 0815 hours compared to 03/17/2022 FINDINGS: RIGHT jugular line tip projecting over confluence of SVC. Normal heart size post CABG. Mediastinal contours and pulmonary vascularity normal. Persistent elevation of RIGHT diaphragm and small LEFT pleural effusion with bibasilar atelectasis. Remaining lungs clear. No pneumothorax or acute osseous findings. IMPRESSION: Persistent bibasilar  atelectasis and small LEFT pleural effusion. Electronically Signed   By: Lavonia Dana M.D.   On: 03/18/2022 08:26     Medications:     Scheduled Medications:  aspirin EC  325 mg Oral Daily   Or   aspirin  324 mg Per Tube Daily   atorvastatin  80 mg Oral Daily   Chlorhexidine Gluconate Cloth  6 each Topical Daily   digoxin  0.125 mg Oral Daily   docusate sodium  200 mg Oral Daily   famotidine  20 mg Oral Daily   fentaNYL (SUBLIMAZE) injection  100 mcg Intravenous Once   insulin aspart  3-9 Units Subcutaneous Q4H   insulin detemir  15 Units Subcutaneous Q12H   lidocaine  2 patch Transdermal Daily   potassium chloride  20 mEq Oral Q4H   sodium chloride flush  3 mL Intravenous Q12H    Infusions:  sodium chloride     sodium chloride Stopped (03/12/22 2101)   amiodarone 30 mg/hr (03/19/22 0657)   ceFEPime (MAXIPIME) IV Stopped (03/18/22 2200)   DOBUTamine 5 mcg/kg/min (03/19/22 0400)   furosemide (LASIX) 200 mg in dextrose 5 % 100 mL (2 mg/mL) infusion 10 mg/hr (03/19/22 0437)   heparin 1,500 Units/hr (03/19/22 0436)   lactated ringers Stopped (03/12/22 2225)   lactated ringers Stopped (03/18/22 1837)   milrinone 0.25 mcg/kg/min (03/19/22 0622)   norepinephrine (LEVOPHED) Adult infusion     vancomycin      PRN Medications: dextrose, morphine injection, mouth rinse, oxyCODONE, prochlorperazine, sodium chloride, sodium chloride flush, traMADol    Patient Profile   67 y/o male w/ h/o submassive PE in March 2023 w/ subsequent RV dysfunction that recovered post treatment with catheter directed thrombolysis, on Xarelto, admitted w/ large acute anterolateral STEMI c/b acute systolic heart failure/shock requiring placement of IABP. Now s/p CABG. AHF team consulted to assist w/ management of HF. Also COVID +   Assessment/Plan   1. Cardiogenic shock/ischemic cardiomyopathy: Echo pre-op with LV EF 25-30%, wall motion abnormalities, normal RV.  IABP placed pre-op for management of  angina.  Intra-op, patient developed acute worsening of LV and RV function as well as acute rise in PA pressure (PASBP 80s-90s).  Difficult to wean off bypass, but eventually successful wean with iNO, inotropes, pressors, IABP.  After prolonged course, pressors weaned off and IABP removed.  Currently on dobutamine 5, milrinone 0.25. CO-OX 58%. Decrease milrinone to 0.125. Check CO-OX in a couple of hrs. If needed, can further increase DBA. Would ideally try to wean him down to 1 inotrope. - CVP 10-11. Continue lasix gtt at 10/hr and repeat 2.5 mg metolazone. Replace K. Corrected Na 129.  - Continue digoxin 0.125 daily.  -  TED hose  2. CAD: Anterolateral MI with cath showing 80% ostial LAD stenosis and 80% ostial large ramus stenosis.  Ostial lesions in close proximity made PCI very difficult and CABG was elected.  Patient underwent LIMA-LAD and SVG-ramus  - ASA - Continue statin.   3. H/o PE: Submassive PE in 3/23, treated with catheter-directed thrombolysis.   - On heparin gtt, will need eventual oral anticoagulation.   4. COVID-19+: Incidental finding at admission. Not on antivirals.   5. Acute hypoxemic respiratory failure: Extubated, stable.   6. Thrombocytopenia: Platelets as low as 57K now 160K suspect post-op though IABP may have played role.   7. Anemia: Post-op. Hgb 11.8>9.9 last couple of days, watch.  8. Atrial ectopy: PACs and SVT runs, have not seen definite atrial fibrillation. No arrhythmias overnight.  - Continue amiodarone gtt while on inotrope support.   9. Leukocytosis: -WBC 9.4>22.6>28K. No fever overnight. Procal 7.4 -BC X 2 NG so far -UA large leukocytes, WBC >50 and rare bacteria -On empiric vanc + cefepime -Switch out line  10. Hyponatremia, hypervolemic:  - Na 125, corrects to 129  - Diurese  Continue to mobilize.  [facility], LINDSAY N 03/19/2022 7:03 AM   Agree with above, Remains on DBA 5 and milrinone 0.25. Co-ox and BP improved, Diuresing with IV  lasix. Now on broad spectrum abx WBC still increasing PCT elevated. Rhythm stable.  Still weak but able to walk some. No CP. + DOE  General:  Weak appearing. No resp difficulty HEENT: normal Neck: supple. + central line Carotids 2+ bilat; no bruits. No lymphadenopathy or thryomegaly appreciated. Cor: Wound site ok  Regular rate & rhythm. No rubs, gallops or murmurs. Lungs: clear dull at left base  Abdomen: soft, nontender, mildly distended. No hepatosplenomegaly. No bruits or masses. Good bowel sounds. Extremities: no cyanosis, clubbing, rash, 1+ edema Neuro: alert & orientedx3, cranial nerves grossly intact. moves all 4 extremities w/o difficulty. Affect pleasant  Continue to wean milrinone. Increase DBA as needed. Continue IV lasix today. Continue abx. Will stop IV amio -> switch to po. Get lines out for line holiday. Await bcx.  CCM to tap left effusion,   CRITICAL CARE Performed by: Glori Bickers  Total critical care time: 40 minutes  Critical care time was exclusive of separately billable procedures and treating other patients.  Critical care was necessary to treat or prevent imminent or life-threatening deterioration.  Critical care was time spent personally by me (independent of midlevel providers or residents) on the following activities: development of treatment plan with patient and/or surrogate as well as nursing, discussions with consultants, evaluation of patient's response to treatment, examination of patient, obtaining history from patient or surrogate, ordering and performing treatments and interventions, ordering and review of laboratory studies, ordering and review of radiographic studies, pulse oximetry and re-evaluation of patient's condition.  Glori Bickers, MD  11:40 PM

## 2022-03-20 DIAGNOSIS — B965 Pseudomonas (aeruginosa) (mallei) (pseudomallei) as the cause of diseases classified elsewhere: Secondary | ICD-10-CM | POA: Diagnosis not present

## 2022-03-20 DIAGNOSIS — I2102 ST elevation (STEMI) myocardial infarction involving left anterior descending coronary artery: Secondary | ICD-10-CM | POA: Diagnosis not present

## 2022-03-20 DIAGNOSIS — R7881 Bacteremia: Secondary | ICD-10-CM | POA: Diagnosis not present

## 2022-03-20 DIAGNOSIS — R570 Cardiogenic shock: Secondary | ICD-10-CM | POA: Diagnosis not present

## 2022-03-20 DIAGNOSIS — I5021 Acute systolic (congestive) heart failure: Secondary | ICD-10-CM | POA: Diagnosis not present

## 2022-03-20 DIAGNOSIS — N179 Acute kidney failure, unspecified: Secondary | ICD-10-CM | POA: Diagnosis not present

## 2022-03-20 LAB — CBC
HCT: 33.1 % — ABNORMAL LOW (ref 39.0–52.0)
Hemoglobin: 11.8 g/dL — ABNORMAL LOW (ref 13.0–17.0)
MCH: 30.4 pg (ref 26.0–34.0)
MCHC: 35.6 g/dL (ref 30.0–36.0)
MCV: 85.3 fL (ref 80.0–100.0)
Platelets: 255 10*3/uL (ref 150–400)
RBC: 3.88 MIL/uL — ABNORMAL LOW (ref 4.22–5.81)
RDW: 14.2 % (ref 11.5–15.5)
WBC: 31 10*3/uL — ABNORMAL HIGH (ref 4.0–10.5)
nRBC: 0.2 % (ref 0.0–0.2)

## 2022-03-20 LAB — BASIC METABOLIC PANEL
Anion gap: 19 — ABNORMAL HIGH (ref 5–15)
Anion gap: 18 — ABNORMAL HIGH (ref 5–15)
BUN: 36 mg/dL — ABNORMAL HIGH (ref 8–23)
BUN: 44 mg/dL — ABNORMAL HIGH (ref 8–23)
CO2: 26 mmol/L (ref 22–32)
CO2: 27 mmol/L (ref 22–32)
Calcium: 8.4 mg/dL — ABNORMAL LOW (ref 8.9–10.3)
Calcium: 8.9 mg/dL (ref 8.9–10.3)
Chloride: 83 mmol/L — ABNORMAL LOW (ref 98–111)
Chloride: 85 mmol/L — ABNORMAL LOW (ref 98–111)
Creatinine, Ser: 1.74 mg/dL — ABNORMAL HIGH (ref 0.61–1.24)
Creatinine, Ser: 2.23 mg/dL — ABNORMAL HIGH (ref 0.61–1.24)
GFR, Estimated: 43 mL/min — ABNORMAL LOW (ref >60)
GFR, Estimated: 32 mL/min — ABNORMAL LOW (ref >60)
Glucose, Bld: 97 mg/dL (ref 70–99)
Glucose, Bld: 102 mg/dL — ABNORMAL HIGH (ref 70–99)
Potassium: 3.3 mmol/L — ABNORMAL LOW (ref 3.5–5.1)
Potassium: 3.8 mmol/L (ref 3.5–5.1)
Sodium: 128 mmol/L — ABNORMAL LOW (ref 135–145)
Sodium: 130 mmol/L — ABNORMAL LOW (ref 135–145)

## 2022-03-20 LAB — PATHOLOGIST SMEAR REVIEW

## 2022-03-20 LAB — HEPARIN LEVEL (UNFRACTIONATED)
Heparin Unfractionated: 0.16 IU/mL — ABNORMAL LOW (ref 0.30–0.70)
Heparin Unfractionated: 0.41 IU/mL (ref 0.30–0.70)

## 2022-03-20 LAB — GLUCOSE, CAPILLARY
Glucose-Capillary: 108 mg/dL — ABNORMAL HIGH (ref 70–99)
Glucose-Capillary: 145 mg/dL — ABNORMAL HIGH (ref 70–99)
Glucose-Capillary: 106 mg/dL — ABNORMAL HIGH (ref 70–99)
Glucose-Capillary: 119 mg/dL — ABNORMAL HIGH (ref 70–99)
Glucose-Capillary: 150 mg/dL — ABNORMAL HIGH (ref 70–99)

## 2022-03-20 LAB — PROCALCITONIN: Procalcitonin: 5.66 ng/mL

## 2022-03-20 MED ORDER — SODIUM CHLORIDE 0.9 % IV SOLN
2.0000 g | Freq: Two times a day (BID) | INTRAVENOUS | Status: DC
  Administered 2022-03-20: 2 g via INTRAVENOUS
  Filled 2022-03-20 (×2): qty 40

## 2022-03-20 MED ORDER — SIMETHICONE 80 MG PO CHEW
80.0000 mg | CHEWABLE_TABLET | Freq: Four times a day (QID) | ORAL | Status: DC | PRN
  Administered 2022-03-20 – 2022-03-28 (×5): 80 mg via ORAL
  Filled 2022-03-20 (×5): qty 1

## 2022-03-20 MED ORDER — LOPERAMIDE HCL 2 MG PO CAPS
2.0000 mg | ORAL_CAPSULE | Freq: Once | ORAL | Status: AC
  Administered 2022-03-20: 2 mg via ORAL
  Filled 2022-03-20: qty 1

## 2022-03-20 MED ORDER — TORSEMIDE 20 MG PO TABS
40.0000 mg | ORAL_TABLET | Freq: Every day | ORAL | Status: DC
  Administered 2022-03-20: 40 mg via ORAL
  Filled 2022-03-20: qty 2

## 2022-03-20 MED ORDER — SIMETHICONE 80 MG PO CHEW: 80.0000 mg | CHEWABLE_TABLET | Freq: Four times a day (QID) | ORAL | Status: DC | PRN

## 2022-03-20 MED ORDER — POTASSIUM CHLORIDE CRYS ER 20 MEQ PO TBCR
40.0000 meq | EXTENDED_RELEASE_TABLET | Freq: Once | ORAL | Status: AC
  Administered 2022-03-20: 40 meq via ORAL
  Filled 2022-03-20: qty 2

## 2022-03-20 MED ORDER — SODIUM CHLORIDE 0.9 % IV SOLN
1.0000 g | Freq: Two times a day (BID) | INTRAVENOUS | Status: DC
  Administered 2022-03-20 – 2022-03-21 (×2): 1 g via INTRAVENOUS
  Filled 2022-03-20 (×2): qty 20

## 2022-03-20 NOTE — Progress Notes (Addendum)
Patient ID: NITESH PITSTICK, male   DOB: [birth year], 67 y.o.   MRN: [id number]   Advanced Heart Failure Rounding Note  PCP-Cardiologist: None   Subjective:    S/p CABG x 2   POD #8  Currently on 5 DBA + 0.125 Milrinone.  7L UOP charted last 24 hrs + unmeasured void. Continues on lasix gtt at 10/hr, received metolazone last 2 days.   Weight down another 4 lb.   BC + for pseudomonas. Now on cefepime. Source felt to be PNA or urine. SWAN introducer pulled yesterday for line holiday.  L thoracentesis yesterday with removal 200 cc fluid  Awaiting AM labs.  Feels okay other than gas pains and diarrhea. Received immodium this am.   Objective:   Weight Range: 87.9 kg Body mass index is 30.35 kg/m.   Vital Signs:   Temp:  [97.7 F (36.5 C)-98.8 F (37.1 C)] 98.4 F (36.9 C) (01/16 2000) Pulse Rate:  [72-80] 80 (01/17 0500) Resp:  [2-29] 23 (01/17 0500) BP: (101-126)/(45-90) 125/79 (01/17 0400) SpO2:  [92 %-98 %] 94 % (01/17 0500) Weight:  [87.9 kg] 87.9 kg (01/17 0500) Last BM Date : 03/19/22  Weight change: Filed Weights   03/18/22 0600 03/19/22 0644 03/20/22 0500  Weight: 90.8 kg 89.7 kg 87.9 kg    Intake/Output:   Intake/Output Summary (Last 24 hours) at 03/20/2022 0704 Last data filed at 03/20/2022 0500 Gross per 24 hour  Intake 1162.04 ml  Output 3747 ml  Net -2584.96 ml      Physical Exam    General:  Sitting up in chair. Appears fatigued. HEENT: normal Neck: supple. JVP not elevated.  Cor: PMI nondisplaced. Regular rate & rhythm. Sternum stable. No rubs, gallops or murmurs. Lungs: diminished in bases Abdomen: soft, nontender, + mildly distended.  Extremities: no cyanosis, clubbing, rash, edema, + TED hose Neuro: alert & oriented x 3. Affect pleasant   Telemetry   SR 70s-80s  Labs    CBC Recent Labs    03/18/22 0440 03/19/22 0426  WBC 22.6* 27.7*  HGB 11.1* 9.9*  HCT 32.2* 28.1*  MCV 89.2 87.8  PLT 146* 784   Basic Metabolic  Panel Recent Labs    03/18/22 0440 03/18/22 1700 03/19/22 0426 03/19/22 1305 03/19/22 1803  NA 130*   < > 125* 126* 128*  K 4.5   < > 2.8* 3.0* 3.6  CL 91*   < > 83* 82* 85*  CO2 30   < > '29 29 27  '$ GLUCOSE 104*   < > 277* 148* 113*  BUN 20   < > 27* 31* 40*  CREATININE 1.49*   < > 1.56* 1.60* 1.58*  CALCIUM 8.1*   < > 7.8* 8.2* 8.6*  MG 1.7  --  2.2  --   --    < > = values in this interval not displayed.   Liver Function Tests Recent Labs    03/19/22 1305  PROT 6.1*    No results for input(s): "LIPASE", "AMYLASE" in the last 72 hours. Cardiac Enzymes No results for input(s): "CKTOTAL", "CKMB", "CKMBINDEX", "TROPONINI" in the last 72 hours.  BNP: BNP (last 3 results) Recent Labs    05/11/21 1052  BNP 23.1    ProBNP (last 3 results) No results for input(s): "PROBNP" in the last 8760 hours.   D-Dimer No results for input(s): "DDIMER" in the last 72 hours. Hemoglobin A1C No results for input(s): "HGBA1C" in the last 72 hours.  Fasting Lipid Panel No  results for input(s): "CHOL", "HDL", "LDLCALC", "TRIG", "CHOLHDL", "LDLDIRECT" in the last 72 hours.  Thyroid Function Tests No results for input(s): "TSH", "T4TOTAL", "T3FREE", "THYROIDAB" in the last 72 hours.  Invalid input(s): "FREET3"  Other results:   Imaging    DG CHEST PORT 1 VIEW  Result Date: 03/19/2022 CLINICAL DATA:  Status post thoracentesis. EXAM: PORTABLE CHEST 1 VIEW COMPARISON:  Chest radiograph 03/18/2022. FINDINGS: 1135 hours. Unchanged right IJ approach central venous catheter with tip projecting over the upper SVC. Intact median sternotomy wires. Low lung volumes with bibasilar atelectasis and prominent cardiomediastinal silhouette, unchanged. Decreased left pleural effusion with trace residual. No pneumothorax. IMPRESSION: Decreased left pleural effusion with trace residual. No pneumothorax. Electronically Signed   By: Emmit Alexanders M.D   On: 03/19/2022 11:49     Medications:      Scheduled Medications:  amiodarone  200 mg Oral BID   aspirin EC  325 mg Oral Daily   Or   aspirin  324 mg Per Tube Daily   atorvastatin  80 mg Oral Daily   Chlorhexidine Gluconate Cloth  6 each Topical Daily   digoxin  0.125 mg Oral Daily   docusate sodium  200 mg Oral Daily   famotidine  20 mg Oral Daily   fentaNYL (SUBLIMAZE) injection  100 mcg Intravenous Once   insulin aspart  3-9 Units Subcutaneous Q4H   insulin detemir  15 Units Subcutaneous Q12H   lidocaine  2 patch Transdermal Daily   sodium chloride flush  3 mL Intravenous Q12H    Infusions:  sodium chloride     sodium chloride Stopped (03/12/22 2101)   ceFEPime (MAXIPIME) IV Stopped (03/19/22 2115)   DOBUTamine 5 mcg/kg/min (03/20/22 0500)   furosemide (LASIX) 200 mg in dextrose 5 % 100 mL (2 mg/mL) infusion 10 mg/hr (03/20/22 0507)   heparin 1,500 Units/hr (03/20/22 0500)   lactated ringers Stopped (03/12/22 2225)   lactated ringers Stopped (03/18/22 1837)   milrinone 0.125 mcg/kg/min (03/20/22 0500)    PRN Medications: morphine injection, mouth rinse, oxyCODONE, prochlorperazine, sodium chloride, sodium chloride flush, traMADol    Patient Profile   67 y/o male w/ h/o submassive PE in March 2023 w/ subsequent RV dysfunction that recovered post treatment with catheter directed thrombolysis, on Xarelto, admitted w/ large acute anterolateral STEMI c/b acute systolic heart failure/shock requiring placement of IABP. Now s/p CABG. AHF team consulted to assist w/ management of HF. Also COVID +   Assessment/Plan   1. Cardiogenic shock/ischemic cardiomyopathy: Echo pre-op with LV EF 25-30%, wall motion abnormalities, normal RV.  IABP placed pre-op for management of angina.  Intra-op, patient developed acute worsening of LV and RV function as well as acute rise in PA pressure (PASBP 80s-90s).  Difficult to wean off bypass, but eventually successful wean with iNO, inotropes, pressors, IABP.  After prolonged course,  pressors weaned off and IABP removed.  Currently on dobutamine 5, milrinone 0.125. No CO-OX this am, line holiday. PICC line eventually -Has diuresed well with lasix gtt at 10/hr + 2.5 mg metolazone. Weight 193 lb, 192 day of surgery. Switch IV lasix to po Torsemide. -Will stop milrinone if renal function stable on today's labs. Continue DBA. -TED hose -Continue digoxin 0.125  2. CAD: Anterolateral MI with cath showing 80% ostial LAD stenosis and 80% ostial large ramus stenosis.  Ostial lesions in close proximity made PCI very difficult and CABG was elected.  Patient underwent LIMA-LAD and SVG-ramus  - ASA - Continue statin.   3. H/o  PE: Submassive PE in 3/23, treated with catheter-directed thrombolysis.   - On heparin gtt, will need eventual oral anticoagulation.   4. COVID-19+: Incidental finding at admission. Not on antivirals.   5. Acute hypoxemic respiratory failure: Extubated, stable.   6. Thrombocytopenia: Platelets as low as 57K now 160K suspect post-op though IABP may have played role.   7. Anemia: Post-op. Hgb 11.8>9.9 last couple of days, watch.  8. Atrial ectopy: PACs and SVT runs, have not seen definite atrial fibrillation. No arrhythmias overnight.  - Off amio gtt. Continue po amio 200 BID  9. Leukocytosis: -WBC 9.4>22.6>28K. AF. Await AM labs -BC grew pseuomonas -UA large leukocytes, WBC >50 and rare bacteria - L thoracentesis 01/16 with removal 200 cc fluid -Now on cefepime -SWAN introducer out. Will need PICC eventually  10. Hyponatremia, hypervolemic:  - Diurese - F/u labs  Continue to mobilize.  [facility], LINDSAY N 03/20/2022 7:04 AM  Patient seen and examined with the above-signed Advanced Practice Provider and/or Housestaff. I personally reviewed laboratory data, imaging studies and relevant notes. I independently examined the patient and formulated the important aspects of the plan. I have edited the note to reflect any of my changes or salient points. I  have personally discussed the plan with the patient and/or family.  ON DBA and milrinone 0.125. Co-ox and BP looks good. Volume back to baseline.   CCM switched abx to meropenum to cover PSA. Rare episodes SVT. On po amio  General:  Sitting in chair No resp difficulty HEENT: normal Neck: supple. no JVD. Carotids 2+ bilat; no bruits. No lymphadenopathy or thryomegaly appreciated. Cor: Sternal site ok Regular rate & rhythm. No rubs, gallops or murmurs. Lungs: clear Abdomen: soft, nontender, nondistended. No hepatosplenomegaly. No bruits or masses. Good bowel sounds. Extremities: no cyanosis, clubbing, rash, edema Neuro: alert & orientedx3, cranial nerves grossly intact. moves all 4 extremities w/o difficulty. Affect pleasant  Improving steadily. Can stop milrinone. Continue to follow coox and CVP. Wean DBA slowly. Switch diuretics to po. Continue abx and mobilization.  Glori Bickers, MD  6:19 PM

## 2022-03-20 NOTE — Progress Notes (Addendum)
NAME:  Juan Snow, MRN:  [id number], DOB:  [birth year], LOS: 57 ADMISSION DATE:  03/10/2022, CONSULTATION DATE:  03/12/22 REFERRING MD:  Kipp Brood, CHIEF COMPLAINT:  CABG   History of Present Illness:  67 year old man presented with chest pain on 03/10/22 found to have STEMI and severe 2V CAD, IABP placed with improvement in anginal symptoms and he was seen by cardiothoracic surgery.  Underwent CABG x 2 today (LIMA-LAD, SVG-ramus) with intra-op course complicated by difficulty weaning off bypass due to high PA pressures and low LV function; with iNO and high dose inotropes+pressors, able to be eventually weaned off pump.  Brought to ICU on pressors, inotropes, iNO, IABP, vent.  PCCM consulted to assist with management.   Pertinent  Medical History  GERD Prior VTE on [facility] PTA Arthtritis  Significant Hospital Events: Including procedures, antibiotic start and stop dates in addition to other pertinent events   1/7 admit 1/9 CABG x 2, saphenous graft harvest 1/10 weaning inotropic and nitric oxide support 1/11 extubated  11/13 intracardiac balloon pump removed. 1/15 worsening leukocytosis. Vanc and cefepime started. Cultures sent  1/16 multiple electrolyte issues, requiring aggressive potassium replacement.  Still on Lasix drip.  Creatinine stable.  Procalcitonin remains elevated.  Ultrasound of left chest showing small/moderate left pleural effusion with plan to proceed with thoracentesis and remove left triple-lumen catheter for line holiday.  Vancomycin stopped, preliminary blood cultures with Pseudomonas.  Left thoracentesis exudative by analysis, 200 mL removed 1/17 changing to oral diuresis, having gas discomfort but otherwise okay  Interim History / Subjective:  Having gas discomfort otherwise doing okay  Objective   Blood pressure 125/79, pulse 80, temperature 98.3 F (36.8 C), temperature source Oral, resp. rate (Abnormal) 23, height '5\' 7"'$  (1.702 m), weight 87.9 kg, SpO2 94 %. CVP:   [1 mmHg-5 mmHg] 1 mmHg      Intake/Output Summary (Last 24 hours) at 03/20/2022 2458 Last data filed at 03/20/2022 0500 Gross per 24 hour  Intake 1112.3 ml  Output 3747 ml  Net -2634.7 ml   Filed Weights   03/18/22 0600 03/19/22 0644 03/20/22 0500  Weight: 90.8 kg 89.7 kg 87.9 kg    Examination: General 67 year old male patient sitting up in bed he is currently in no acute distress HEENT normocephalic atraumatic no jugular venous distention appreciated right old triple-lumen catheter IJ site dressing intact Pulmonary: Clear to auscultation some fine crackles both bases postthoracentesis chest x-ray with bibasilar atelectasis and improved left lower lobe aeration Cardiac: Regular rate and rhythm Abdomen: Soft nontender Extremities: Warm dry brisk cap refill trace edema Neuro: Awake oriented no focal deficits. GU: Clear yellow.  Resolved issues     Assessment & Plan:   Post cardiotomy shock state - appears largely cardiogenic at this time.  STEMI s/p 2V CABG Plan Cont multi-modal pain rx IS  Mobilize  Diuresis per heart failure Dobutamine at 0.25, cardiology to decide on when to discontinue milrinone  Continuing digoxin  Continuing amiodarone   Small left pleural effusion, exudative by lights criteria Plan Continue to follow  Pseudomonas bacteremia I suspect this was urinary source, had significant "ugly looking urine" per the patient when the Foley catheter was removed  plan Discontinue vancomycin from med rec continue cefepime now day #3, awaiting culture and sensitivity data  Likely baseline CKD (now acute on chronic renal failure)  Creatinine stabilizing Plan Continuing diuresis as outlined by heart failure team  Abdominal discomfort and gas pain Suspect secondary to antibiotics Plan As needed simethicone  Hyponatremia, hypomagnesemia hypokalemia -Sodium continues to fall.  There is a large discrepancy between his serum and capillary blood  glucoses Plan Replace potassium  Continue to trend chemistries closely  Maximize cardiac output  Agree with changing IV Lasix to p.o. torsemide  GERD Plan PPI  Hx PE on AC PTA Plan Can resume IV heparin today  Hyperglycemia Has excellent control currently  plan Continuing sliding scale insulin and current levemir at 15 units  Best Practice (right click and "Reselect all SmartList Selections" daily)   Diet/type: Regular diet. DVT prophylaxis: not indicated GI prophylaxis: PPI Lines: Central line Foley:  Yes, and it is still needed Code Status:  full code Last date of multidisciplinary goals of care discussion [per primary]  CRITICAL CARE NA  Erick Colace ACNP-BC [place] Pager # [phone] OR # [phone] if no answer   Critical care attending attestation note: I agree with the Advanced Practitioner's note, impression, and recommendations as outlined. I have taken an independent interval history, reviewed the chart and examined the patient. The following reflects my medical decision making and independent critical care time   Synopsis of assessment and plan: 67 year old male with acute STEMI, underwent CABG due to multivessel coronary artery disease, complicated with cardiogenic shock  Patient blood cultures 1 out of 4 bottles grew Pseudomonas, probably urinary source His white count continue to trend up He remained afebrile Complaining of abdominal discomfort and gas    Physical exam: General: Elderly male, lying on the bed HEENT: [place]/AT, eyes anicteric.  moist mucus membranes Neuro: Alert, awake following commands Chest: Coarse breath sounds, no wheezes or rhonchi Heart: Regular rate and rhythm, no murmurs or gallops Abdomen: Soft, nontender, nondistended, bowel sounds present Skin: No rash  Labs and images were reviewed  Assessment and plan: Acute anterior STEMI s/p CABG x 2 Acute HFrEF with cardiogenic shock Sepsis due to  Pseudomonas bacteremia probably urinary source  Submassive PE on anticoagulation Anemia/thrombocytopenia critical illness AKI on CKD stage IIIa Hypervolemic hyponatremia Acute hypoxic respiratory failure in the setting of pulmonary edema and pleural effusion  Continue aspirin and statin Continue Lasix infusion Milrinone was titrated off, currently on dobutamine Patient white count continue to trend up, despite being on cefepime for 3 days Will switch it to meropenem Follow-up sensitivity results Continue IV anticoagulation with heparin H&H and platelet counts stable Serum creatinine is slightly elevated today, monitor intake and output Serum sodium remained at 128 Continue to titrate nasal cannula oxygen Thoracentesis was performed yesterday with 200 cc of pleural fluid consistent with exudative effusion  This patient is critically ill with multiple organ system failure which requires frequent high complexity decision making, assessment, support, evaluation, and titration of therapies. This was completed through the application of advanced monitoring technologies and extensive interpretation of multiple databases.  During this encounter critical care time was devoted to patient care services described in this note for 33 minutes.    Jacky Kindle, MD [facility] See Amion for pager If no response to pager, please call [phone] until 7pm After 7pm, Please call E-link [phone]   03/20/2022, 4:03 PM

## 2022-03-20 NOTE — Consult Note (Addendum)
[facility] for Infectious Disease    Date of Admission:  03/10/2022     Reason for Consult: pseudomonas bacteremia    Referring Provider: Charise Carwin. lightfoot     Lines:  Peripheral iv's  Abx: 1/17-c meropenem   1/15-17 cefepime 1/11-15 cefazolin 1/09-1/15 vanc        Assessment: 67 yo male ischemic cardiomyopathy, admitted with stemi 1/07 and underwent cabg (1/09), post-op requirement IABP, course complicated by hospital onset sepsis with pseudomonas bacteremia  He has had low grade temp up to 100.4 around POD#2 although has had vanc/cefazolin post op. His wbc count trended up acutely 1/14 and also bcx was positive for pseudomonas  He was transitioned to potentially appropriate abx cefepime on 1/15 but changed again to meropenem on 1/17 for concern of rising wbc count  Source agree entirely unclear. He has had right IJ for swan that was removed 1/16, and IABP right femoral area that was removed 1/13. He also has foley that was removed 1/16. Cxr has been showing LLL opacity but he has been requiring diuresis/inotropic support along with iabp ?volume related. He hasn't exhibited productive cough  His incision has had no sign of dehiscence/cellulitis  Micro: 1/15 bcx pseudomonas aeruginosa -- susceptibility pending 1/16 mrsa nares negative   Regardless clinically he is improving. The wbc trend can lag behind clinical improvement. And he only has 2 days cefepime. Pseudomonas doesn't have escalating resistant pattern like other enterobactereciae where we escalate tx from cephalosporin to meropenem. So either cefepime/meropenem potentially might/might not work, but given clinical improvement suspect cefepime probably ok. Removal of central lines obviously might help as well  He hasn't had mrsa on culture and had more than adequate vancomycin duration, which had been stopped  Plan: Continue meropenem. If susceptibility indicates, we will change back to cefepime  and potentially finish course with PO cipro to finish 10 days of pseudomonal coverage starting 1/16 (days of all central line/indwelling foley removal) F/u susceptibility testing No need to recheck blood culture Discussed with primary team    I spent 75 minute reviewing data/chart, and coordinating care and >50% direct face to face time providing counseling/discussing diagnostics/treatment plan with patient       ------------------------------------------------ Principal Problem:   STEMI (ST elevation myocardial infarction) ([place]) Active Problems:   AKI (acute kidney injury) ([place])   Cardiogenic shock ([place])   Acute systolic heart failure ([place])   CAD in native artery   Pleural effusion on left    HPI: Juan Snow is a 67 y.o. male ischemic cardiomyopathy, admitted with stemi 1/07 and underwent cabg (1/09), post-op requirement IABP, course complicated by hospital onset sepsis with pseudomonas bacteremia  He was extubated within a day of CABG  He has had low grade temp up to 100.4 around POD#2 although has had vanc/cefazolin post op. His wbc count trended up acutely 1/14 and also bcx was positive for pseudomonas  He was transitioned to potentially appropriate abx cefepime on 1/15 but changed again to meropenem on 1/17 for concern of rising wbc count  Source agree entirely unclear. He has had right IJ for swan that was removed 1/16, and IABP right femoral area that was removed 1/13. He also has foley that was removed 1/16. Cxr has been showing LLL opacity but he has been requiring diuresis/inotropic support along with iabp ?volume related. He hasn't exhibited productive cough  He is currently feeling well. He has some abd discomfort but no diarrhea for a  day. No other complaint. His oxygen requirement is currently at 4 liters per minute and has been improving  No dysuria/urgency/frequency No rash No productive cough No chest pain   Family History  Problem Relation Age of  Onset   CAD Brother    Hypertension Other    Diabetes Neg Hx     Social History   Tobacco Use   Smoking status: Never   Smokeless tobacco: Never  Vaping Use   Vaping Use: Never used  Substance Use Topics   Alcohol use: No   Drug use: No    No Known Allergies  Review of Systems: ROS All Other ROS was negative, except mentioned above   Past Medical History:  Diagnosis Date   Arthritis    OA   Cough    WHITE SPUTUM OCC, NO FEVER COLD SYMPTOMS MAINLY   GERD (gastroesophageal reflux disease)        Scheduled Meds:  amiodarone  200 mg Oral BID   aspirin EC  325 mg Oral Daily   Or   aspirin  324 mg Per Tube Daily   atorvastatin  80 mg Oral Daily   Chlorhexidine Gluconate Cloth  6 each Topical Daily   digoxin  0.125 mg Oral Daily   docusate sodium  200 mg Oral Daily   famotidine  20 mg Oral Daily   fentaNYL (SUBLIMAZE) injection  100 mcg Intravenous Once   insulin aspart  3-9 Units Subcutaneous Q4H   insulin detemir  15 Units Subcutaneous Q12H   lidocaine  2 patch Transdermal Daily   potassium chloride  40 mEq Oral Once   sodium chloride flush  3 mL Intravenous Q12H   torsemide  40 mg Oral Daily   Continuous Infusions:  sodium chloride     sodium chloride Stopped (03/12/22 2101)   DOBUTamine 5 mcg/kg/min (03/20/22 0830)   heparin 1,650 Units/hr (03/20/22 0830)   lactated ringers Stopped (03/12/22 2225)   lactated ringers Stopped (03/18/22 1837)   meropenem (MERREM) IV 2 g (03/20/22 1142)   milrinone Stopped (03/20/22 0825)   PRN Meds:.morphine injection, mouth rinse, oxyCODONE, prochlorperazine, simethicone, sodium chloride, sodium chloride flush, traMADol   OBJECTIVE: Blood pressure 96/67, pulse 71, temperature 98.6 F (37 C), temperature source Oral, resp. rate (!) 25, height '5\' 7"'$  (1.702 m), weight 87.9 kg, SpO2 99 %.  Physical Exam  General/constitutional: no distress, pleasant HEENT: Normocephalic, PER, Conj Clear, EOMI, Oropharynx clear Neck  supple CV: rrr no mrg Lungs: clear to auscultation, normal respiratory effort Abd: Soft, Nontender Ext: no edema Skin: incision no dehiscence/erythema/purulence Neuro: nonfocal MSK: no peripheral joint swelling/tenderness/warmth; back spines nontender   Central line presence: no   Lab Results Lab Results  Component Value Date   WBC 31.0 (H) 03/20/2022   HGB 11.8 (L) 03/20/2022   HCT 33.1 (L) 03/20/2022   MCV 85.3 03/20/2022   PLT 255 03/20/2022    Lab Results  Component Value Date   CREATININE 1.74 (H) 03/20/2022   BUN 36 (H) 03/20/2022   NA 128 (L) 03/20/2022   K 3.3 (L) 03/20/2022   CL 83 (L) 03/20/2022   CO2 26 03/20/2022    Lab Results  Component Value Date   ALT 24 03/10/2022   AST 26 03/10/2022   ALKPHOS 48 03/10/2022   BILITOT 0.5 03/10/2022      Microbiology: Recent Results (from the past 240 hour(s))  Surgical PCR screen     Status: None   Collection Time: 03/11/22  8:56 AM  Specimen: Nasal Mucosa; Nasal Swab  Result Value Ref Range Status   MRSA, PCR NEGATIVE NEGATIVE Final   Staphylococcus aureus NEGATIVE NEGATIVE Final    Comment: (NOTE) The Xpert SA Assay (FDA approved for NASAL specimens in patients 22 years of age and older), is one component of a comprehensive surveillance program. It is not intended to diagnose infection nor to guide or monitor treatment. Performed at [facility], [place] [address] [place], [place] [address]   SARS Coronavirus 2 by RT PCR (hospital order, performed in [facility] hospital lab) *cepheid single result test* Anterior Nasal Swab     Status: Abnormal   Collection Time: 03/11/22 10:32 AM   Specimen: Anterior Nasal Swab  Result Value Ref Range Status   SARS Coronavirus 2 by RT PCR POSITIVE (A) NEGATIVE Final    Comment: (NOTE) SARS-CoV-2 target nucleic acids are DETECTED  SARS-CoV-2 RNA is generally detectable in upper respiratory specimens  during the acute phase of infection.  Positive results are  indicative  of the presence of the identified virus, but do not rule out bacterial infection or co-infection with other pathogens not detected by the test.  Clinical correlation with patient history and  other diagnostic information is necessary to determine patient infection status.  The expected result is negative.  Fact Sheet for Patients:   https://www.patel.info/   Fact Sheet for Healthcare Providers:   https://hall.com/    This test is not yet approved or cleared by the [place] FDA and  has been authorized for detection and/or diagnosis of SARS-CoV-2 by FDA under an Emergency Use Authorization (EUA).  This EUA will remain in effect (meaning this test can be used) for the duration of  the COVID-19 declaration under Section 564(b)(1)  of the Act, 21 U.S.C. section 360-bbb-3(b)(1), unless the authorization is terminated or revoked sooner.   Performed at [facility], [place] [address] [place], [place] [address]   Culture, blood (Routine X 2) w Reflex to ID Panel     Status: Abnormal (Preliminary result)   Collection Time: 03/18/22  8:33 AM   Specimen: BLOOD RIGHT HAND  Result Value Ref Range Status   Specimen Description BLOOD RIGHT HAND  Final   Special Requests   Final    BOTTLES DRAWN AEROBIC ONLY Blood Culture results may not be optimal due to an inadequate volume of blood received in culture bottles   Culture  Setup Time   Final    GRAM NEGATIVE RODS BOTTLES DRAWN AEROBIC ONLY CRITICAL VALUE NOTED.  VALUE IS CONSISTENT WITH PREVIOUSLY REPORTED AND CALLED VALUE. Performed at [facility], [place] [address] [place], [place] [address]    Culture PSEUDOMONAS AERUGINOSA (A)  Final   Report Status PENDING  Incomplete  Culture, blood (Routine X 2) w Reflex to ID Panel     Status: Abnormal (Preliminary result)   Collection Time: 03/18/22  8:33 AM   Specimen: BLOOD LEFT HAND  Result Value Ref Range Status   Specimen  Description BLOOD LEFT HAND  Final   Special Requests   Final    BOTTLES DRAWN AEROBIC AND ANAEROBIC Blood Culture adequate volume   Culture  Setup Time   Final    GRAM NEGATIVE RODS AEROBIC BOTTLE ONLY Organism ID to follow CRITICAL RESULT CALLED TO, READ BACK BY AND VERIFIED WITH: pharmd Ferne Coe [id number] at 1412 by cm    Culture (A)  Final    PSEUDOMONAS AERUGINOSA SUSCEPTIBILITIES TO FOLLOW Performed at [facility], [place] [address], [place], [place] [address]    Report Status PENDING  Incomplete  Blood Culture ID Panel (Reflexed)     Status: Abnormal   Collection Time: 03/18/22  8:33 AM  Result Value Ref Range Status   Enterococcus faecalis NOT DETECTED NOT DETECTED Final   Enterococcus Faecium NOT DETECTED NOT DETECTED Final   Listeria monocytogenes NOT DETECTED NOT DETECTED Final   Staphylococcus species NOT DETECTED NOT DETECTED Final   Staphylococcus aureus (BCID) NOT DETECTED NOT DETECTED Final   Staphylococcus epidermidis NOT DETECTED NOT DETECTED Final   Staphylococcus lugdunensis NOT DETECTED NOT DETECTED Final   Streptococcus species NOT DETECTED NOT DETECTED Final   Streptococcus agalactiae NOT DETECTED NOT DETECTED Final   Streptococcus pneumoniae NOT DETECTED NOT DETECTED Final   Streptococcus pyogenes NOT DETECTED NOT DETECTED Final   A.calcoaceticus-baumannii NOT DETECTED NOT DETECTED Final   Bacteroides fragilis NOT DETECTED NOT DETECTED Final   Enterobacterales NOT DETECTED NOT DETECTED Final   Enterobacter cloacae complex NOT DETECTED NOT DETECTED Final   Escherichia coli NOT DETECTED NOT DETECTED Final   Klebsiella aerogenes NOT DETECTED NOT DETECTED Final   Klebsiella oxytoca NOT DETECTED NOT DETECTED Final   Klebsiella pneumoniae NOT DETECTED NOT DETECTED Final   Proteus species NOT DETECTED NOT DETECTED Final   Salmonella species NOT DETECTED NOT DETECTED Final   Serratia marcescens NOT DETECTED NOT DETECTED Final   Haemophilus influenzae NOT  DETECTED NOT DETECTED Final   Neisseria meningitidis NOT DETECTED NOT DETECTED Final   Pseudomonas aeruginosa DETECTED (A) NOT DETECTED Final    Comment: CRITICAL RESULT CALLED TO, READ BACK BY AND VERIFIED WITH: PHARMD E MARTIN [id number] AT 1413 BY CM    Stenotrophomonas maltophilia NOT DETECTED NOT DETECTED Final   Candida albicans NOT DETECTED NOT DETECTED Final   Candida auris NOT DETECTED NOT DETECTED Final   Candida glabrata NOT DETECTED NOT DETECTED Final   Candida krusei NOT DETECTED NOT DETECTED Final   Candida parapsilosis NOT DETECTED NOT DETECTED Final   Candida tropicalis NOT DETECTED NOT DETECTED Final   Cryptococcus neoformans/gattii NOT DETECTED NOT DETECTED Final   CTX-M ESBL NOT DETECTED NOT DETECTED Final   Carbapenem resistance IMP NOT DETECTED NOT DETECTED Final   Carbapenem resistance KPC NOT DETECTED NOT DETECTED Final   Carbapenem resistance NDM NOT DETECTED NOT DETECTED Final   Carbapenem resistance VIM NOT DETECTED NOT DETECTED Final    Comment: Performed at [facility], [address] [place], [place] [address]  MRSA Next Gen by PCR, Nasal     Status: None   Collection Time: 03/19/22  8:40 AM   Specimen: Nasal Mucosa; Nasal Swab  Result Value Ref Range Status   MRSA by PCR Next Gen NOT DETECTED NOT DETECTED Final    Comment: (NOTE) The GeneXpert MRSA Assay (FDA approved for NASAL specimens only), is one component of a comprehensive MRSA colonization surveillance program. It is not intended to diagnose MRSA infection nor to guide or monitor treatment for MRSA infections. Test performance is not FDA approved in patients less than 19 years old. Performed at [facility], [place] [address] [place], [place] [address]      Serology:    Imaging: If present, new imagings (plain films, ct scans, and mri) have been personally visualized and interpreted; radiology reports have been reviewed. Decision making incorporated into the Impression /  Recommendations.  1/16 xray chest Decreased left pleural effusion with trace residual. No pneumothorax.  1/12  xray chest 1. Interval removal of the endotracheal and enteric tubes with decreased lung volumes and bibasilar atelectasis. 2. Otherwise unchanged support lines and tubes. 3. Trace bilateral pleural effusions.   1/07 xray chest 1. Intra-aortic balloon pump radiopaque marker in the anticipated region of the proximal descending aorta. 2. Low lung volumes with mild atelectasis at the lung bases. 3. Small left pleural effusion.   1/08 tte  1. Left ventricular ejection fraction, by estimation, is 25 to 30%. The  left ventricle has severely decreased function. The left ventricle  demonstrates regional wall motion abnormalities (see scoring  diagram/findings for description). Left ventricular  diastolic parameters are consistent with Grade II diastolic dysfunction  (pseudonormalization).   2. Right ventricular systolic function is normal. The right ventricular  size is normal. There is mildly elevated pulmonary artery systolic  pressure.   3. The mitral valve is normal in structure. Trivial mitral valve  regurgitation. No evidence of mitral stenosis.   4. The aortic valve is tricuspid. Aortic valve regurgitation is not  visualized. No aortic stenosis is present.   5. The inferior vena cava is normal in size with greater than 50%  respiratory variability, suggesting right atrial pressure of 3 mmHg.   Comparison(s): Prior images reviewed side by side. The left ventricular  function is significantly worse. The right ventricular systolic function  has improved.   Jabier Mutton, [facility] [place] [phone] pager    03/20/2022, 1:20 PM

## 2022-03-20 NOTE — Progress Notes (Addendum)
Pt c/o bowel discomfort from frequent stools, likely r/t IV antibiotics. Requested something to help. Called elink, awaiting further orders.  Addendum: given order for immodium, see eMAR

## 2022-03-20 NOTE — Progress Notes (Signed)
ANTICOAGULATION CONSULT NOTE - Follow Up Consult  Pharmacy Consult for IV heparin Indication: IABP x hx DVT/PE  No Known Allergies  Patient Measurements: Height: '5\' 7"'$  (170.2 cm) Weight: 87.9 kg (193 lb 12.6 oz) IBW/kg (Calculated) : 66.1 Heparin Dosing Weight: 84.2 kg  Vital Signs: Temp: 98.3 F (36.8 C) (01/17 0400) Temp Source: Oral (01/17 0400) BP: 125/79 (01/17 0400) Pulse Rate: 80 (01/17 0500)  Labs: Recent Labs    03/18/22 0440 03/18/22 1700 03/19/22 0426 03/19/22 1305 03/19/22 1803 03/20/22 0546  HGB 11.1*  --  9.9*  --   --  11.8*  HCT 32.2*  --  28.1*  --   --  33.1*  PLT 146*  --  160  --   --  255  HEPARINUNFRC 0.38  --  0.32  --   --  0.16*  CREATININE 1.49*   < > 1.56* 1.60* 1.58*  --    < > = values in this interval not displayed.    Estimated Creatinine Clearance: 49.3 mL/min (A) (by C-G formula based on SCr of 1.58 mg/dL (H)).   Medications:  Infusions:   sodium chloride     sodium chloride Stopped (03/12/22 2101)   ceFEPime (MAXIPIME) IV Stopped (03/19/22 2115)   DOBUTamine 5 mcg/kg/min (03/20/22 0500)   heparin 1,500 Units/hr (03/20/22 0711)   lactated ringers Stopped (03/12/22 2225)   lactated ringers Stopped (03/18/22 1837)   milrinone 0.125 mcg/kg/min (03/20/22 0500)    Assessment: 67 yo male CABG, now with IABP. Also with hx PE/DVT. Pharmacy asked to begin low-dose IV heparin gtt. No overt bleeding noted. Initial heparin level checked prior to heparin initiation is undetectable and last Xarelto dose PTA 1/7.    Heparin resumed after IABP removal 1/13, targeting low goal given recent sternotomy. Heparin level subtherapeutic this AM at 0.17. PLT 255, Hgb 11.8. No s/sx of bleeding or issues with the infusion site per RN.   Goal of Therapy:  Heparin level 0.3-0.5 units/ml Monitor platelets by anticoagulation protocol: Yes   Plan:  Increase heparin infusion to 1650 units/hr Recheck heparin level in 6 hours Monitor heparin level,  platelets, hemoglobin, and s/sx of bleeding daily  Juan Snow, PharmD Candidate 03/20/2022,7:52 AM

## 2022-03-20 NOTE — Progress Notes (Signed)
ANTICOAGULATION CONSULT NOTE - Follow Up Consult  Pharmacy Consult for IV heparin Indication: IABP + hx DVT/PE  No Known Allergies  Patient Measurements: Height: '5\' 7"'$  (170.2 cm) Weight: 87.9 kg (193 lb 12.6 oz) IBW/kg (Calculated) : 66.1 Heparin Dosing Weight: 87 kg  Vital Signs: Temp: 97.9 F (36.6 C) (01/17 1606) Temp Source: Oral (01/17 1606) BP: 115/98 (01/17 1600) Pulse Rate: 70 (01/17 1606)  Labs: Recent Labs    03/18/22 0440 03/18/22 1700 03/19/22 0426 03/19/22 1305 03/19/22 1803 03/20/22 0546 03/20/22 1639  HGB 11.1*  --  9.9*  --   --  11.8*  --   HCT 32.2*  --  28.1*  --   --  33.1*  --   PLT 146*  --  160  --   --  255  --   HEPARINUNFRC 0.38  --  0.32  --   --  0.16* 0.41  CREATININE 1.49*   < > 1.56*   < > 1.58* 1.74* 2.23*   < > = values in this interval not displayed.     Estimated Creatinine Clearance: 34.9 mL/min (A) (by C-G formula based on SCr of 2.23 mg/dL (H)).  Medications:  Infusions:   sodium chloride     sodium chloride Stopped (03/12/22 2101)   DOBUTamine 5 mcg/kg/min (03/20/22 1600)   heparin 1,650 Units/hr (03/20/22 1600)   lactated ringers Stopped (03/12/22 2225)   lactated ringers Stopped (03/18/22 1837)   meropenem (MERREM) IV     milrinone Stopped (03/20/22 0825)    Assessment: 67 yo male CABG, now with IABP. Also with hx PE/DVT. Pharmacy asked to begin low-dose IV heparin gtt. No overt bleeding noted. Initial heparin level checked prior to heparin initiation is undetectable and last Xarelto dose PTA 1/7. Heparin resumed after IABP removal 1/13, targeting low goal given recent sternotomy.   Heparin level remains therapeutic (0.41) on infusion at 1650 units/hr.  Goal of Therapy:  Heparin level 0.3-0.5 units/ml Monitor platelets by anticoagulation protocol: Yes   Plan:  Continue heparin 1650 units/hr F/u daily heparin level and CBC  Sherlon Handing, PharmD, BCPS Please see amion for complete clinical pharmacist phone  list 03/20/2022,5:40 PM

## 2022-03-20 NOTE — Progress Notes (Signed)
[address]       [place] [address]             [phone]      8 Days Post-Op  Procedure(s) (LRB): CORONARY ARTERY BYPASS GRAFTING (CABG) X TWO BYPASSES USING LEFT INTERNAL MAMMARY ARTERY AND RIGHT GREATER SAPHENOUS  VEIN HARVEST. (N/A) TRANSESOPHAGEAL ECHOCARDIOGRAM (TEE) (N/A)  Total Length of Stay:  LOS: 10 days   SUBJECTIVE: Feels better  Has pseudomonas bacteremia Complaining of abd discomfort from distention with some nausea, is passing gas Vitals:   03/20/22 0800 03/20/22 0833  BP: 114/88   Pulse: 75 73  Resp: 17 (!) 24  Temp:  98.5 F (36.9 C)  SpO2: 92% 97%    Intake/Output      01/16 0701 01/17 0700 01/17 0701 01/18 0700   P.O. 100    I.V. (mL/kg) 662 (7.5) 101.7 (1.2)   IV Piggyback 400    Total Intake(mL/kg) 1162 (13.2) 101.7 (1.2)   Urine (mL/kg/hr) 3746 (1.8)    Stool 1    Total Output 3747    Net -2585 +101.7        Stool Occurrence 2 x 1 x       sodium chloride     sodium chloride Stopped (03/12/22 2101)   ceFEPime (MAXIPIME) IV Stopped (03/19/22 2115)   DOBUTamine 5 mcg/kg/min (03/20/22 0830)   heparin 1,650 Units/hr (03/20/22 0830)   lactated ringers Stopped (03/12/22 2225)   lactated ringers Stopped (03/18/22 1837)   milrinone Stopped (03/20/22 0825)    CBC    Component Value Date/Time   WBC 31.0 (H) 03/20/2022 0546   RBC 3.88 (L) 03/20/2022 0546   HGB 11.8 (L) 03/20/2022 0546   HCT 33.1 (L) 03/20/2022 0546   PLT 255 03/20/2022 0546   MCV 85.3 03/20/2022 0546   MCH 30.4 03/20/2022 0546   MCHC 35.6 03/20/2022 0546   RDW 14.2 03/20/2022 0546   LYMPHSABS 1.3 03/10/2022 1155   MONOABS 1.3 (H) 03/10/2022 1155   EOSABS 0.1 03/10/2022 1155   BASOSABS 0.0 03/10/2022 1155   CMP     Component Value Date/Time   NA 128 (L) 03/20/2022 0546   K 3.3 (L) 03/20/2022 0546   CL 83 (L) 03/20/2022 0546   CO2 26 03/20/2022 0546   GLUCOSE 97 03/20/2022 0546   BUN 36 (H) 03/20/2022 0546   CREATININE 1.74 (H)  03/20/2022 0546   CALCIUM 8.4 (L) 03/20/2022 0546   PROT 6.1 (L) 03/19/2022 1305   ALBUMIN 3.7 03/10/2022 1155   AST 26 03/10/2022 1155   ALT 24 03/10/2022 1155   ALKPHOS 48 03/10/2022 1155   BILITOT 0.5 03/10/2022 1155   GFRNONAA 43 (L) 03/20/2022 0546   GFRAA >60 06/26/2017 0529   ABG    Component Value Date/Time   PHART 7.357 03/12/2022 1724   PCO2ART 34.4 03/12/2022 1724   PO2ART 164 (H) 03/12/2022 1724   HCO3 19.4 (L) 03/12/2022 1724   TCO2 20 (L) 03/12/2022 1724   ACIDBASEDEF 6.0 (H) 03/12/2022 1724   O2SAT 58 03/19/2022 0420   CBG (last 3)  Recent Labs    03/19/22 2344 03/20/22 0505 03/20/22 0837  GLUCAP 95 108* 145*  EXAM Lungs: decreased at bases Card: rr Abd: distended, slightly tender to palpation, little to none bowel sounds Ext warm   ASSESSMENT: POD #8 sp Cabg Hemodynamics stable on Dobutamine, now off milrinone Continue diuresis Antibiotics for bacteremia. Presumed Foley source. For new CVP Monitor abd distension  for now. Getting simethicone Needs to ambulate   Coralie Common, MD 03/20/2022

## 2022-03-20 NOTE — Progress Notes (Addendum)
Pharmacy Antibiotic Note  Juan Snow is a 67 y.o. male admitted on 03/10/2022 with STEMI s/p IABP and CABG x2.  He is noted with pseudomonal bacteremia and was on cefepime but switched to meropenem for worsening WBC, Pharmacy has been consulted for meropenem dosing. ID is also following -WBC= 31, afebrile, SCr= 1.74 (CrCl ~ 45)   Plan: - Meropenem 1gm IV q12h -Will follow renal function, cultures and clinical progress   Height: '5\' 7"'$  (170.2 cm) Weight: 87.9 kg (193 lb 12.6 oz) IBW/kg (Calculated) : 66.1  Temp (24hrs), Avg:98.3 F (36.8 C), Min:97.7 F (36.5 C), Max:98.6 F (37 C)  Recent Labs  Lab 03/16/22 0325 03/16/22 0900 03/17/22 0340 03/18/22 0440 03/18/22 1700 03/19/22 0426 03/19/22 1305 03/19/22 1803 03/20/22 0546  WBC 9.5  --  9.4 22.6*  --  27.7*  --   --  31.0*  CREATININE 1.36*   < > 1.28* 1.49* 1.70* 1.56* 1.60* 1.58* 1.74*   < > = values in this interval not displayed.     Estimated Creatinine Clearance: 44.8 mL/min (A) (by C-G formula based on SCr of 1.74 mg/dL (H)).    No Known Allergies  Antimicrobials this admission: 1/15 vancomycin >> 1/17 1/15 cefepime >> 1/17 1/17 meropenem  Dose adjustments this admission: N/A  Microbiology results: 1/15 bloods x2- pseudomonas  Thank you for allowing pharmacy to be a part of this patient's care.  Hildred Laser, PharmD Clinical Pharmacist **Pharmacist phone directory can now be found on [email or web address] ([email or web address]).  Listed under [place].

## 2022-03-20 NOTE — Progress Notes (Signed)
TCTS Progress Note: 8 Days Post-Op Procedure(s) (LRB): CORONARY ARTERY BYPASS GRAFTING (CABG) X TWO BYPASSES USING LEFT INTERNAL MAMMARY ARTERY AND RIGHT GREATER SAPHENOUS  VEIN HARVEST. (N/A) TRANSESOPHAGEAL ECHOCARDIOGRAM (TEE) (N/A)  LOS: 10 days     Sinus 70s + gas today Family reports 1BM earlier today Dobutamine 5 Sitting up in chair, talkative  Plan: cont current care           Latest Ref Rng & Units 03/20/2022    5:46 AM 03/19/2022    4:26 AM 03/18/2022    4:40 AM  CBC  WBC 4.0 - 10.5 K/uL 31.0  27.7  22.6   Hemoglobin 13.0 - 17.0 g/dL 11.8  9.9  11.1   Hematocrit 39.0 - 52.0 % 33.1  28.1  32.2   Platelets 150 - 400 K/uL 255  160  146        Latest Ref Rng & Units 03/20/2022    4:39 PM 03/20/2022    5:46 AM 03/19/2022    6:03 PM  CMP  Glucose 70 - 99 mg/dL 102  97  113   BUN 8 - 23 mg/dL 44  36  40   Creatinine 0.61 - 1.24 mg/dL 2.23  1.74  1.58   Sodium 135 - 145 mmol/L 130  128  128   Potassium 3.5 - 5.1 mmol/L 3.8  3.3  3.6   Chloride 98 - 111 mmol/L 85  83  85   CO2 22 - 32 mmol/L '27  26  27   '$ Calcium 8.9 - 10.3 mg/dL 8.9  8.4  8.6     ABG    Component Value Date/Time   PHART 7.357 03/12/2022 1724   PCO2ART 34.4 03/12/2022 1724   PO2ART 164 (H) 03/12/2022 1724   HCO3 19.4 (L) 03/12/2022 1724   TCO2 20 (L) 03/12/2022 1724   ACIDBASEDEF 6.0 (H) 03/12/2022 1724   O2SAT 58 03/19/2022 0420

## 2022-03-21 ENCOUNTER — Inpatient Hospital Stay: Payer: Self-pay

## 2022-03-21 ENCOUNTER — Inpatient Hospital Stay (HOSPITAL_COMMUNITY): Payer: Medicare Other

## 2022-03-21 DIAGNOSIS — N179 Acute kidney failure, unspecified: Secondary | ICD-10-CM | POA: Diagnosis not present

## 2022-03-21 DIAGNOSIS — R570 Cardiogenic shock: Secondary | ICD-10-CM | POA: Diagnosis not present

## 2022-03-21 DIAGNOSIS — I2102 ST elevation (STEMI) myocardial infarction involving left anterior descending coronary artery: Secondary | ICD-10-CM | POA: Diagnosis not present

## 2022-03-21 DIAGNOSIS — M19079 Primary osteoarthritis, unspecified ankle and foot: Secondary | ICD-10-CM | POA: Diagnosis not present

## 2022-03-21 DIAGNOSIS — R7881 Bacteremia: Secondary | ICD-10-CM | POA: Diagnosis not present

## 2022-03-21 LAB — COOXEMETRY PANEL
Carboxyhemoglobin: 1.5 % (ref 0.5–1.5)
Methemoglobin: <0.7 % (ref 0.0–1.5)
O2 Saturation: 53.1 %
Total hemoglobin: 10.8 g/dL — ABNORMAL LOW (ref 12.0–16.0)

## 2022-03-21 LAB — CBC
HCT: 33.3 % — ABNORMAL LOW (ref 39.0–52.0)
Hemoglobin: 11.4 g/dL — ABNORMAL LOW (ref 13.0–17.0)
MCH: 29.8 pg (ref 26.0–34.0)
MCHC: 34.2 g/dL (ref 30.0–36.0)
MCV: 87.2 fL (ref 80.0–100.0)
Platelets: 316 10*3/uL (ref 150–400)
RBC: 3.82 MIL/uL — ABNORMAL LOW (ref 4.22–5.81)
RDW: 14.4 % (ref 11.5–15.5)
WBC: 25.4 10*3/uL — ABNORMAL HIGH (ref 4.0–10.5)
nRBC: 0 % (ref 0.0–0.2)

## 2022-03-21 LAB — CULTURE, BLOOD (ROUTINE X 2): Special Requests: ADEQUATE

## 2022-03-21 LAB — BASIC METABOLIC PANEL
Anion gap: 14 (ref 5–15)
Anion gap: 16 — ABNORMAL HIGH (ref 5–15)
BUN: 56 mg/dL — ABNORMAL HIGH (ref 8–23)
BUN: 64 mg/dL — ABNORMAL HIGH (ref 8–23)
CO2: 29 mmol/L (ref 22–32)
CO2: 27 mmol/L (ref 22–32)
Calcium: 8.6 mg/dL — ABNORMAL LOW (ref 8.9–10.3)
Calcium: 8.5 mg/dL — ABNORMAL LOW (ref 8.9–10.3)
Chloride: 89 mmol/L — ABNORMAL LOW (ref 98–111)
Chloride: 88 mmol/L — ABNORMAL LOW (ref 98–111)
Creatinine, Ser: 2.76 mg/dL — ABNORMAL HIGH (ref 0.61–1.24)
Creatinine, Ser: 3.4 mg/dL — ABNORMAL HIGH (ref 0.61–1.24)
GFR, Estimated: 25 mL/min — ABNORMAL LOW (ref >60)
GFR, Estimated: 19 mL/min — ABNORMAL LOW (ref >60)
Glucose, Bld: 91 mg/dL (ref 70–99)
Glucose, Bld: 170 mg/dL — ABNORMAL HIGH (ref 70–99)
Potassium: 3.6 mmol/L (ref 3.5–5.1)
Potassium: 4.2 mmol/L (ref 3.5–5.1)
Sodium: 132 mmol/L — ABNORMAL LOW (ref 135–145)
Sodium: 131 mmol/L — ABNORMAL LOW (ref 135–145)

## 2022-03-21 LAB — GLUCOSE, CAPILLARY
Glucose-Capillary: 103 mg/dL — ABNORMAL HIGH (ref 70–99)
Glucose-Capillary: 107 mg/dL — ABNORMAL HIGH (ref 70–99)
Glucose-Capillary: 122 mg/dL — ABNORMAL HIGH (ref 70–99)
Glucose-Capillary: 149 mg/dL — ABNORMAL HIGH (ref 70–99)
Glucose-Capillary: 153 mg/dL — ABNORMAL HIGH (ref 70–99)
Glucose-Capillary: 87 mg/dL (ref 70–99)
Glucose-Capillary: 91 mg/dL (ref 70–99)

## 2022-03-21 LAB — SYNOVIAL FLUID, CRYSTAL: Crystals, Fluid: NONE SEEN

## 2022-03-21 LAB — HEPARIN LEVEL (UNFRACTIONATED): Heparin Unfractionated: 0.41 IU/mL (ref 0.30–0.70)

## 2022-03-21 LAB — URIC ACID: Uric Acid, Serum: 16.4 mg/dL — ABNORMAL HIGH (ref 3.7–8.6)

## 2022-03-21 LAB — DIGOXIN LEVEL: Digoxin Level: 0.6 ng/mL — ABNORMAL LOW (ref 0.8–2.0)

## 2022-03-21 MED ORDER — SODIUM CHLORIDE 0.9% FLUSH
10.0000 mL | Freq: Two times a day (BID) | INTRAVENOUS | Status: DC
  Administered 2022-03-21 – 2022-03-25 (×9): 10 mL
  Administered 2022-03-25: 20 mL
  Administered 2022-03-26: 10 mL
  Administered 2022-03-27: 20 mL
  Administered 2022-03-28: 30 mL

## 2022-03-21 MED ORDER — METHYLPREDNISOLONE 4 MG PO TBPK: 4.0000 mg | ORAL_TABLET | ORAL | Status: DC

## 2022-03-21 MED ORDER — SODIUM CHLORIDE 0.9 % IV SOLN
2.0000 g | Freq: Two times a day (BID) | INTRAVENOUS | Status: DC
  Administered 2022-03-21: 2 g via INTRAVENOUS
  Filled 2022-03-21: qty 12.5

## 2022-03-21 MED ORDER — METHYLPREDNISOLONE 4 MG PO TBPK: 8.0000 mg | ORAL_TABLET | Freq: Every evening | ORAL | Status: DC

## 2022-03-21 MED ORDER — METHYLPREDNISOLONE 4 MG PO TBPK: 4.0000 mg | ORAL_TABLET | Freq: Four times a day (QID) | ORAL | Status: DC

## 2022-03-21 MED ORDER — INSULIN DETEMIR 100 UNIT/ML ~~LOC~~ SOLN
12.0000 [IU] | Freq: Two times a day (BID) | SUBCUTANEOUS | Status: DC
  Administered 2022-03-21 – 2022-03-28 (×15): 12 [IU] via SUBCUTANEOUS
  Filled 2022-03-21 (×16): qty 0.12

## 2022-03-21 MED ORDER — SODIUM CHLORIDE 0.9% FLUSH: 10.0000 mL | INTRAVENOUS | Status: DC | PRN

## 2022-03-21 MED ORDER — METHYLPREDNISOLONE 4 MG PO TBPK
8.0000 mg | ORAL_TABLET | Freq: Every morning | ORAL | Status: DC
  Filled 2022-03-21: qty 21

## 2022-03-21 MED ORDER — METHYLPREDNISOLONE 4 MG PO TBPK: 4.0000 mg | ORAL_TABLET | Freq: Three times a day (TID) | ORAL | Status: DC

## 2022-03-21 MED ORDER — LACTATED RINGERS IV BOLUS
500.0000 mL | Freq: Once | INTRAVENOUS | Status: AC
  Administered 2022-03-21: 500 mL via INTRAVENOUS

## 2022-03-21 MED ORDER — LIDOCAINE HCL (PF) 1 % IJ SOLN
5.0000 mL | Freq: Once | INTRAMUSCULAR | Status: AC
  Administered 2022-03-21: 1 mL

## 2022-03-21 MED ORDER — PREDNISONE 20 MG PO TABS
40.0000 mg | ORAL_TABLET | Freq: Every day | ORAL | Status: AC
  Administered 2022-03-21 – 2022-03-24 (×4): 40 mg via ORAL
  Filled 2022-03-21 (×4): qty 2

## 2022-03-21 NOTE — Progress Notes (Signed)
[facility]  Date of Admission:  03/10/2022       Lines: 1/18-c picc rue  Abx: 1/15-17; 1/18-c cefepime  1/17-18 meropenem 1/11-15 cefazolin 1/09-1/15 vanc    Other: 1/18-c pred   ASSESSMENT: Pseudomonas bacteremia Right mtp joint tenderness -all central lines/foley removed by 1/17 -abx changed to meropenem 1/17 -susceptibility back today 1/18 PsA sensitive to cefepime -wbc trending down today 1/18  Suspect could be removal of line contributing to wbc improvement vs normal wbc response taking a little longer   The right mtp joint pain/swelling x2 days since 1/17 concerning in setting of pseudomonal bsi. He never had gout before, and acute gout joint can commonly have infection in setting bacteremia seeding. Would aspirate joint for culture to rule this out. Primary team had started     PE Cad s/p cabg 1/09 Heart failure requiring iabp briefly Stemi   PLAN: Change abx back to cefepime Strongly recommend joint aspiration of right first mtp joint and send for cell count/culture to rule out pseudomonas joint involvement Abx duration 7 day starting 1/17 if no joint involvement, otherwise will be 4 weeks with joint washout if joint is infected Discussed with primary team   I spent 50 minute reviewing data/chart, and coordinating care and >50% direct face to face time providing counseling/discussing diagnostics/treatment plan with patient   Principal Problem:   STEMI (ST elevation myocardial infarction) ([place]) Active Problems:   AKI (acute kidney injury) ([place])   Cardiogenic shock ([place])   Acute systolic heart failure ([facility])   CAD in native artery   Pleural effusion on left   Bacteremia due to Pseudomonas   No Known Allergies  Scheduled Meds:  amiodarone  200 mg Oral BID   aspirin EC  325 mg Oral Daily   Or   aspirin  324 mg Per Tube Daily   atorvastatin  80 mg Oral Daily   Chlorhexidine Gluconate Cloth  6 each Topical  Daily   docusate sodium  200 mg Oral Daily   famotidine  20 mg Oral Daily   insulin aspart  3-9 Units Subcutaneous Q4H   insulin detemir  12 Units Subcutaneous Q12H   lidocaine  2 patch Transdermal Daily   predniSONE  40 mg Oral Q breakfast   sodium chloride flush  10-40 mL Intracatheter Q12H   sodium chloride flush  3 mL Intravenous Q12H   Continuous Infusions:  sodium chloride     sodium chloride Stopped (03/12/22 2101)   ceFEPime (MAXIPIME) IV     DOBUTamine 5 mcg/kg/min (03/21/22 0700)   heparin 1,650 Units/hr (03/21/22 1007)   lactated ringers Stopped (03/12/22 2225)   lactated ringers Stopped (03/18/22 1837)   PRN Meds:.morphine injection, mouth rinse, oxyCODONE, prochlorperazine, simethicone, sodium chloride, sodium chloride flush, sodium chloride flush, traMADol   SUBJECTIVE: Right mtp joint pain x2 days. Severe.  Afebrile Wbc down  Started on pred 1/18 by primary team No prior hx gout Picc placed today for heart failure management  Review of Systems: ROS All other ROS was negative, except mentioned above     OBJECTIVE: Vitals:   03/21/22 1000 03/21/22 1015 03/21/22 1030 03/21/22 1126  BP: 91/76     Pulse: 68 69 69   Resp: (!) 26 20 (!) 23   Temp:    99.6 F (37.6 C)  TempSrc:    Oral  SpO2: 98% 97% 95%   Weight:      Height:  Body mass index is 30.14 kg/m.  Physical Exam General/constitutional: no distress, pleasant HEENT: Normocephalic, PER, Conj Clear, EOMI, Oropharynx clear Neck supple CV: rrr no mrg Lungs: clear to auscultation, normal respiratory effort Abd: Soft, Nontender Ext: bilateral LE trace edema; right foot swelling Skin: No Rash Neuro: nonfocal MSK: right mtp joint extremely tender on passive rom    Lab Results Lab Results  Component Value Date   WBC 25.4 (H) 03/21/2022   HGB 11.4 (L) 03/21/2022   HCT 33.3 (L) 03/21/2022   MCV 87.2 03/21/2022   PLT 316 03/21/2022    Lab Results  Component Value Date    CREATININE 2.76 (H) 03/21/2022   BUN 56 (H) 03/21/2022   NA 132 (L) 03/21/2022   K 3.6 03/21/2022   CL 89 (L) 03/21/2022   CO2 29 03/21/2022    Lab Results  Component Value Date   ALT 24 03/10/2022   AST 26 03/10/2022   ALKPHOS 48 03/10/2022   BILITOT 0.5 03/10/2022      Microbiology: Recent Results (from the past 240 hour(s))  Culture, blood (Routine X 2) w Reflex to ID Panel     Status: Abnormal   Collection Time: 03/18/22  8:33 AM   Specimen: BLOOD RIGHT HAND  Result Value Ref Range Status   Specimen Description BLOOD RIGHT HAND  Final   Special Requests   Final    BOTTLES DRAWN AEROBIC ONLY Blood Culture results may not be optimal due to an inadequate volume of blood received in culture bottles   Culture  Setup Time   Final    GRAM NEGATIVE RODS BOTTLES DRAWN AEROBIC ONLY CRITICAL VALUE NOTED.  VALUE IS CONSISTENT WITH PREVIOUSLY REPORTED AND CALLED VALUE.    Culture (A)  Final    PSEUDOMONAS AERUGINOSA SUSCEPTIBILITIES PERFORMED ON PREVIOUS CULTURE WITHIN THE LAST 5 DAYS. Performed at [facility], [place] [address] [place], [place] [address]    Report Status 03/21/2022 FINAL  Final  Culture, blood (Routine X 2) w Reflex to ID Panel     Status: Abnormal   Collection Time: 03/18/22  8:33 AM   Specimen: BLOOD LEFT HAND  Result Value Ref Range Status   Specimen Description BLOOD LEFT HAND  Final   Special Requests   Final    BOTTLES DRAWN AEROBIC AND ANAEROBIC Blood Culture adequate volume   Culture  Setup Time   Final    GRAM NEGATIVE RODS AEROBIC BOTTLE ONLY Organism ID to follow CRITICAL RESULT CALLED TO, READ BACK BY AND VERIFIED WITH: pharmd Ferne Coe [id number] at 1412 by cm Performed at [facility], [place] [address], [place], [place] [address]    Culture PSEUDOMONAS AERUGINOSA (A)  Final   Report Status 03/21/2022 FINAL  Final   Organism ID, Bacteria PSEUDOMONAS AERUGINOSA  Final      Susceptibility   Pseudomonas aeruginosa - MIC*    CEFTAZIDIME 4  SENSITIVE Sensitive     CIPROFLOXACIN <=0.25 SENSITIVE Sensitive     GENTAMICIN <=1 SENSITIVE Sensitive     IMIPENEM 2 SENSITIVE Sensitive     PIP/TAZO 8 SENSITIVE Sensitive     CEFEPIME 2 SENSITIVE Sensitive     * PSEUDOMONAS AERUGINOSA  Blood Culture ID Panel (Reflexed)     Status: Abnormal   Collection Time: 03/18/22  8:33 AM  Result Value Ref Range Status   Enterococcus faecalis NOT DETECTED NOT DETECTED Final   Enterococcus Faecium NOT DETECTED NOT DETECTED Final   Listeria monocytogenes NOT DETECTED NOT  DETECTED Final   Staphylococcus species NOT DETECTED NOT DETECTED Final   Staphylococcus aureus (BCID) NOT DETECTED NOT DETECTED Final   Staphylococcus epidermidis NOT DETECTED NOT DETECTED Final   Staphylococcus lugdunensis NOT DETECTED NOT DETECTED Final   Streptococcus species NOT DETECTED NOT DETECTED Final   Streptococcus agalactiae NOT DETECTED NOT DETECTED Final   Streptococcus pneumoniae NOT DETECTED NOT DETECTED Final   Streptococcus pyogenes NOT DETECTED NOT DETECTED Final   A.calcoaceticus-baumannii NOT DETECTED NOT DETECTED Final   Bacteroides fragilis NOT DETECTED NOT DETECTED Final   Enterobacterales NOT DETECTED NOT DETECTED Final   Enterobacter cloacae complex NOT DETECTED NOT DETECTED Final   Escherichia coli NOT DETECTED NOT DETECTED Final   Klebsiella aerogenes NOT DETECTED NOT DETECTED Final   Klebsiella oxytoca NOT DETECTED NOT DETECTED Final   Klebsiella pneumoniae NOT DETECTED NOT DETECTED Final   Proteus species NOT DETECTED NOT DETECTED Final   Salmonella species NOT DETECTED NOT DETECTED Final   Serratia marcescens NOT DETECTED NOT DETECTED Final   Haemophilus influenzae NOT DETECTED NOT DETECTED Final   Neisseria meningitidis NOT DETECTED NOT DETECTED Final   Pseudomonas aeruginosa DETECTED (A) NOT DETECTED Final    Comment: CRITICAL RESULT CALLED TO, READ BACK BY AND VERIFIED WITH: PHARMD E MARTIN [id number] AT 1413 BY CM    Stenotrophomonas  maltophilia NOT DETECTED NOT DETECTED Final   Candida albicans NOT DETECTED NOT DETECTED Final   Candida auris NOT DETECTED NOT DETECTED Final   Candida glabrata NOT DETECTED NOT DETECTED Final   Candida krusei NOT DETECTED NOT DETECTED Final   Candida parapsilosis NOT DETECTED NOT DETECTED Final   Candida tropicalis NOT DETECTED NOT DETECTED Final   Cryptococcus neoformans/gattii NOT DETECTED NOT DETECTED Final   CTX-M ESBL NOT DETECTED NOT DETECTED Final   Carbapenem resistance IMP NOT DETECTED NOT DETECTED Final   Carbapenem resistance KPC NOT DETECTED NOT DETECTED Final   Carbapenem resistance NDM NOT DETECTED NOT DETECTED Final   Carbapenem resistance VIM NOT DETECTED NOT DETECTED Final    Comment: Performed at [facility], [address] [place], [place] [address]  MRSA Next Gen by PCR, Nasal     Status: None   Collection Time: 03/19/22  8:40 AM   Specimen: Nasal Mucosa; Nasal Swab  Result Value Ref Range Status   MRSA by PCR Next Gen NOT DETECTED NOT DETECTED Final    Comment: (NOTE) The GeneXpert MRSA Assay (FDA approved for NASAL specimens only), is one component of a comprehensive MRSA colonization surveillance program. It is not intended to diagnose MRSA infection nor to guide or monitor treatment for MRSA infections. Test performance is not FDA approved in patients less than 15 years old. Performed at [facility], [place] [address], [place], [place] [address]      Serology:   Imaging: If present, new imagings (plain films, ct scans, and mri) have been personally visualized and interpreted; radiology reports have been reviewed. Decision making incorporated into the Impression / Recommendations.  1/16 xray chest Decreased left pleural effusion with trace residual. No pneumothorax.   1/12 xray chest 1. Interval removal of the endotracheal and enteric tubes with decreased lung volumes and bibasilar atelectasis. 2. Otherwise unchanged support lines and  tubes. 3. Trace bilateral pleural effusions.   1/07 xray chest 1. Intra-aortic balloon pump radiopaque marker in the anticipated region of the proximal descending aorta. 2. Low lung volumes with mild atelectasis at the lung bases. 3. Small left pleural effusion.   1/08  tte  1. Left ventricular ejection fraction, by estimation, is 25 to 30%. The  left ventricle has severely decreased function. The left ventricle  demonstrates regional wall motion abnormalities (see scoring  diagram/findings for description). Left ventricular  diastolic parameters are consistent with Grade II diastolic dysfunction  (pseudonormalization).   2. Right ventricular systolic function is normal. The right ventricular  size is normal. There is mildly elevated pulmonary artery systolic  pressure.   3. The mitral valve is normal in structure. Trivial mitral valve  regurgitation. No evidence of mitral stenosis.   4. The aortic valve is tricuspid. Aortic valve regurgitation is not  visualized. No aortic stenosis is present.   5. The inferior vena cava is normal in size with greater than 50%  respiratory variability, suggesting right atrial pressure of 3 mmHg.   Comparison(s): Prior images reviewed side by side. The left ventricular  function is significantly worse. The right ventricular systolic function  has improved.    Jabier Mutton, [facility] [place] [phone] pager    03/21/2022, 12:15 PM

## 2022-03-21 NOTE — Progress Notes (Signed)
ANTICOAGULATION CONSULT NOTE - Follow Up Consult  Pharmacy Consult for IV heparin Indication: IABP + hx DVT/PE   No Known Allergies  Patient Measurements: Height: '5\' 7"'$  (170.2 cm) Weight: 87.3 kg (192 lb 7.4 oz) IBW/kg (Calculated) : 66.1 Heparin Dosing Weight: 84 kg  Vital Signs: Temp: 98.8 F (37.1 C) (01/18 0400) Temp Source: Oral (01/18 0400) BP: 105/73 (01/18 0700) Pulse Rate: 66 (01/18 0700)  Labs: Recent Labs    03/19/22 0426 03/19/22 1305 03/19/22 1803 03/20/22 0546 03/20/22 1639 03/21/22 0645  HGB 9.9*  --   --  11.8*  --  11.4*  HCT 28.1*  --   --  33.1*  --  33.3*  PLT 160  --   --  255  --  316  HEPARINUNFRC 0.32  --   --  0.16* 0.41 0.41  CREATININE 1.56*   < > 1.58* 1.74* 2.23*  --    < > = values in this interval not displayed.    Estimated Creatinine Clearance: 34.8 mL/min (A) (by C-G formula based on SCr of 2.23 mg/dL (H)).   Medications:  Infusions:   sodium chloride     sodium chloride Stopped (03/12/22 2101)   DOBUTamine 5 mcg/kg/min (03/21/22 0700)   heparin 1,650 Units/hr (03/21/22 0700)   lactated ringers Stopped (03/12/22 2225)   lactated ringers Stopped (03/18/22 1837)   meropenem (MERREM) IV Stopped (03/20/22 2204)    Assessment: 67 yo male CABG, now with IABP. Also with hx PE/DVT. Pharmacy asked to begin low-dose IV heparin gtt. No overt bleeding noted. Initial heparin level checked prior to heparin initiation is undetectable and last Xarelto dose PTA 1/7. Heparin resumed after IABP removal 1/13, targeting low goal given recent sternotomy.   Heparin level remains therapeutic (0.41) on infusion at 1650 units/hr. PLTs 316, Hgb 11.4 stable. No overt signs of bleeding noted.  Goal of Therapy:  Heparin level 0.3-0.5 units/ml Monitor platelets by anticoagulation protocol: Yes   Plan:  Continue heparin infusion 1650 units/hr Monitor heparin level, platelets, hemoglobin, and s/sx of bleeding daily  Freddie Breech, PharmD  Candidate 03/21/2022,7:38 AM

## 2022-03-21 NOTE — Progress Notes (Signed)
[address]       [place] [address]             [phone]      POD 9 CABG  Resting comfortably, just back form IR  BP 91/76   Pulse 69   Temp 99.6 F (37.6 C) (Oral)   Resp (!) 23   Ht '5\' 7"'$  (1.702 m)   Wt 87.3 kg   SpO2 95%   BMI 30.14 kg/m  On dobutamine @ 5 CVP 14, co-ox 53 (58 on 1/16)  Intake/Output Summary (Last 24 hours) at 03/21/2022 1709 Last data filed at 03/21/2022 0700 Gross per 24 hour  Intake 444.87 ml  Output 730 ml  Net -285.13 ml   Continue dobutamine, defer to AHF team on additional meds  Juan Knutzen C. Roxan Hockey, MD [facility] [phone]

## 2022-03-21 NOTE — Progress Notes (Signed)
Pt complaining of 10/10 pain in the right foot.The right foot is more swollen and red than the left. No pain in calf or lower leg, only foot. All pulses were palpated and confirmed with doppler. Pt had minimal to no pain throughout the night and was able to stand and transfer to and from [facility] at 0400 with no problem. TED Hose were on all night and pt is on a heparin gtt. Gave pt '10mg'$  oxy for pain.  Elink notified of new symptoms. Advised to elevate the foot and wait for TCTS to round this morning for further orders. Pt is resting comfortably in chair with right foot elevated.

## 2022-03-21 NOTE — Progress Notes (Addendum)
Patient ID: Juan Snow, male   DOB: 11-20-1955, 67 y.o.   MRN: [id number]   Advanced Heart Failure Rounding Note  PCP-Cardiologist: None   Subjective:    S/p CABG x 2   POD #9  Currently on 5 DBA. No CO-OX with line holiday.   Weight stable with po Torsemide. Scr trending up 1.6>2.3>2.8.   BC + for pseudomonas. Now on cefepime. Source felt to be PNA or urine. Line holiday. SWAN introducer pulled 01/16.  Feeling well other than R great toe and dorsal foot pain.   Objective:   Weight Range: 87.3 kg Body mass index is 30.14 kg/m.   Vital Signs:   Temp:  [97.8 F (36.6 C)-98.8 F (37.1 C)] 98.8 F (37.1 C) (01/18 0400) Pulse Rate:  [67-80] 70 (01/18 0600) Resp:  [12-28] 20 (01/18 0600) BP: (91-115)/(52-98) 101/75 (01/18 0600) SpO2:  [92 %-100 %] 97 % (01/18 0600) Weight:  [87.3 kg] 87.3 kg (01/18 0500) Last BM Date : 03/20/21  Weight change: Filed Weights   03/19/22 0644 03/20/22 0500 03/21/22 0500  Weight: 89.7 kg 87.9 kg 87.3 kg    Intake/Output:   Intake/Output Summary (Last 24 hours) at 03/21/2022 0705 Last data filed at 03/21/2022 0600 Gross per 24 hour  Intake 985.64 ml  Output 960 ml  Net 25.64 ml      Physical Exam    General:  Reclined in chair. HEENT: normal Neck: supple. no JVD.  Cor: PMI nondisplaced. Regular rate & rhythm. No rubs, gallops or murmurs. Lungs: clear Abdomen: soft, nontender, nondistended.  Extremities: no cyanosis, clubbing, rash, 1+ edema RLE, trace on left Neuro: alert & orientedx3, cranial nerves grossly intact. Affect pleasant    Telemetry   SR 70s  Labs    CBC Recent Labs    03/19/22 0426 03/20/22 0546  WBC 27.7* 31.0*  HGB 9.9* 11.8*  HCT 28.1* 33.1*  MCV 87.8 85.3  PLT 160 527   Basic Metabolic Panel Recent Labs    03/19/22 0426 03/19/22 1305 03/20/22 0546 03/20/22 1639  NA 125*   < > 128* 130*  K 2.8*   < > 3.3* 3.8  CL 83*   < > 83* 85*  CO2 29   < > 26 27  GLUCOSE 277*   < > 97 102*   BUN 27*   < > 36* 44*  CREATININE 1.56*   < > 1.74* 2.23*  CALCIUM 7.8*   < > 8.4* 8.9  MG 2.2  --   --   --    < > = values in this interval not displayed.   Liver Function Tests Recent Labs    03/19/22 1305  PROT 6.1*    No results for input(s): "LIPASE", "AMYLASE" in the last 72 hours. Cardiac Enzymes No results for input(s): "CKTOTAL", "CKMB", "CKMBINDEX", "TROPONINI" in the last 72 hours.  BNP: BNP (last 3 results) Recent Labs    05/11/21 1052  BNP 23.1    ProBNP (last 3 results) No results for input(s): "PROBNP" in the last 8760 hours.   D-Dimer No results for input(s): "DDIMER" in the last 72 hours. Hemoglobin A1C No results for input(s): "HGBA1C" in the last 72 hours.  Fasting Lipid Panel No results for input(s): "CHOL", "HDL", "LDLCALC", "TRIG", "CHOLHDL", "LDLDIRECT" in the last 72 hours.  Thyroid Function Tests No results for input(s): "TSH", "T4TOTAL", "T3FREE", "THYROIDAB" in the last 72 hours.  Invalid input(s): "FREET3"  Other results:   Imaging    No results  found.   Medications:     Scheduled Medications:  amiodarone  200 mg Oral BID   aspirin EC  325 mg Oral Daily   Or   aspirin  324 mg Per Tube Daily   atorvastatin  80 mg Oral Daily   Chlorhexidine Gluconate Cloth  6 each Topical Daily   digoxin  0.125 mg Oral Daily   docusate sodium  200 mg Oral Daily   famotidine  20 mg Oral Daily   fentaNYL (SUBLIMAZE) injection  100 mcg Intravenous Once   insulin aspart  3-9 Units Subcutaneous Q4H   insulin detemir  15 Units Subcutaneous Q12H   lidocaine  2 patch Transdermal Daily   sodium chloride flush  3 mL Intravenous Q12H   torsemide  40 mg Oral Daily    Infusions:  sodium chloride     sodium chloride Stopped (03/12/22 2101)   DOBUTamine 5 mcg/kg/min (03/21/22 0600)   heparin 1,650 Units/hr (03/21/22 0600)   lactated ringers Stopped (03/12/22 2225)   lactated ringers Stopped (03/18/22 1837)   meropenem (MERREM) IV Stopped  (03/20/22 2204)   milrinone Stopped (03/20/22 0825)    PRN Medications: morphine injection, mouth rinse, oxyCODONE, prochlorperazine, simethicone, sodium chloride, sodium chloride flush, traMADol    Patient Profile   67 y/o male w/ h/o submassive PE in March 2023 w/ subsequent RV dysfunction that recovered post treatment with catheter directed thrombolysis, on Xarelto, admitted w/ large acute anterolateral STEMI c/b acute systolic heart failure/shock requiring placement of IABP. Now s/p CABG. AHF team consulted to assist w/ management of HF. Also COVID +   Assessment/Plan   1. Cardiogenic shock/ischemic cardiomyopathy: Echo pre-op with LV EF 25-30%, wall motion abnormalities, normal RV.  IABP placed pre-op for management of angina.  Intra-op, patient developed acute worsening of LV and RV function as well as acute rise in PA pressure (PASBP 80s-90s).  Difficult to wean off bypass, but eventually successful wean with iNO, inotropes, pressors, IABP.  After prolonged course, pressors weaned off and IABP removed.  Milrinone off, currently on 5 DBA. No CO-OX this am with line holiday. PICC line today with WBCs trending down. -Diuresed well. Scr trending up last couple of days, 1.6>1.74>2.2>2.8. Stop Torsemide. Give 500 cc LR. -Will stop digoxin with AKI -TED hose  2. CAD: Anterolateral MI with cath showing 80% ostial LAD stenosis and 80% ostial large ramus stenosis.  Ostial lesions in close proximity made PCI very difficult and CABG was elected.  Patient underwent LIMA-LAD and SVG-ramus  - ASA - Continue statin.   3. H/o PE: Submassive PE in 3/23, treated with catheter-directed thrombolysis.   - On heparin gtt, will need eventual oral anticoagulation.   4. COVID-19+: Incidental finding at admission. Not on antivirals.   5. Acute hypoxemic respiratory failure: Extubated, stable.   6. Thrombocytopenia: Platelets as low as 57K now improved suspect post-op though IABP may have played role.    7. Anemia: Post-op. Hgb now stable 11s, follow  8. Atrial ectopy: PACs and SVT runs, have not seen definite atrial fibrillation. No arrhythmias overnight.  - Off amio gtt. Continue po amio 200 BID  9. Leukocytosis: -WBC 9.4>31>25K. AF.  -BC grew pseudomonas. Source thought to be urine or PNA. -UA large leukocytes, WBC >50 and rare bacteria - L thoracentesis 01/16 with removal 200 cc fluid -Now on meropenem -Line holiday almost 48 hrs. Place PICC today   10. Hyponatremia, hypervolemic:  - F/u labs  11. Right foot pain: - Suspect possible gout -  Check uric acid - Will likely need short course of prednisone  Continue to mobilize.  [facility], LINDSAY N 03/21/2022 7:05 AM  Patient seen and examined with the above-signed Advanced Practice Provider and/or Housestaff. I personally reviewed laboratory data, imaging studies and relevant notes. I independently examined the patient and formulated the important aspects of the plan. I have edited the note to reflect any of my changes or salient points. I have personally discussed the plan with the patient and/or family.  Feeling better. Denies CP or SOB. Had 1 BM. Off diuretics due to AKI.  Scr 2.3 -> 2.8   WBC coming down on abx. C/o podagra. On DBA 5  General: Sitting in chair  No resp difficulty HEENT: normal Neck: supple. no JVD. Carotids 2+ bilat; no bruits. No lymphadenopathy or thryomegaly appreciated. Cor: Sternal wound ok  Regular rate & rhythm. No rubs, gallops or murmurs. Lungs: clear Abdomen: soft, nontender, nondistended. No hepatosplenomegaly. No bruits or masses. Good bowel sounds. Extremities: no cyanosis, clubbing, rash, edema R 1st MTP sore painful ROM Neuro: alert & orientedx3, cranial nerves grossly intact. moves all 4 extremities w/o difficulty. Affect pleasant  Continue DBA. Continue Abx. Hold diuretics. Give some fluid back. Check uric acid. If elevated -> prednisone.   Place picc to help wean DBA.  Keep in ICU  today  D/w TCTS and CCM at bedside  Glori Bickers, MD  7:59 AM

## 2022-03-21 NOTE — Progress Notes (Signed)
PT Cancellation Note  Patient Details Name: Juan Snow MRN: [id number] DOB: 07-16-1955   Cancelled Treatment:    Reason Eval/Treat Not Completed: Patient at procedure or test/unavailable. Will plan to follow-up later as time permits.   Moishe Spice, PT, DPT Acute Rehabilitation Services  Office: [place] 03/21/2022, 3:00 PM

## 2022-03-21 NOTE — Progress Notes (Signed)
NAME:  Juan Snow, MRN:  [id number], DOB:  Jun 25, 1955, LOS: 50 ADMISSION DATE:  03/10/2022, CONSULTATION DATE:  03/12/22 REFERRING MD:  Kipp Brood, CHIEF COMPLAINT:  CABG   History of Present Illness:  67 year old man presented with chest pain on 03/10/22 found to have STEMI and severe 2V CAD, IABP placed with improvement in anginal symptoms and he was seen by cardiothoracic surgery.  Underwent CABG x 2 today (LIMA-LAD, SVG-ramus) with intra-op course complicated by difficulty weaning off bypass due to high PA pressures and low LV function; with iNO and high dose inotropes+pressors, able to be eventually weaned off pump.  Brought to ICU on pressors, inotropes, iNO, IABP, vent.  PCCM consulted to assist with management.   Pertinent  Medical History  GERD Prior VTE on [facility] PTA Arthtritis  Significant Hospital Events: Including procedures, antibiotic start and stop dates in addition to other pertinent events   1/7 admit 1/9 CABG x 2, saphenous graft harvest 1/10 weaning inotropic and nitric oxide support 1/11 extubated  11/13 intracardiac balloon pump removed. 1/15 worsening leukocytosis. Vanc and cefepime started. Cultures sent  1/16 multiple electrolyte issues, requiring aggressive potassium replacement.  Still on Lasix drip.  Creatinine stable.  Procalcitonin remains elevated.  Ultrasound of left chest showing small/moderate left pleural effusion with plan to proceed with thoracentesis and remove left triple-lumen catheter for line holiday.  Vancomycin stopped, preliminary blood cultures with Pseudomonas.  Left thoracentesis exudative by analysis, 200 mL removed 1/17 changing to oral diuresis, having gas discomfort but otherwise okay  Interim History / Subjective:  Complaining of right toe pain Denies shortness of breath, cough Remained afebrile Diarrhea is better  Objective   Blood pressure 105/73, pulse 66, temperature 97.9 F (36.6 C), temperature source Oral, resp. rate 19, height  '5\' 7"'$  (1.702 m), weight 87.3 kg, SpO2 97 %.        Intake/Output Summary (Last 24 hours) at 03/21/2022 6803 Last data filed at 03/21/2022 0700 Gross per 24 hour  Intake 1008.59 ml  Output 1080 ml  Net -71.41 ml   Filed Weights   03/19/22 0644 03/20/22 0500 03/21/22 0500  Weight: 89.7 kg 87.9 kg 87.3 kg    Examination: General 67 year old male patient sitting up in bed he is currently in no acute distress HEENT normocephalic atraumatic no jugular venous distention appreciated right old triple-lumen catheter IJ site dressing intact Pulmonary: Clear to auscultation some fine crackles both bases postthoracentesis chest x-ray with bibasilar atelectasis and improved left lower lobe aeration Cardiac: Regular rate and rhythm Abdomen: Soft nontender Extremities: Warm dry brisk cap refill trace edema Neuro: Awake oriented no focal deficits. GU: Clear yellow.  Resolved issues   Abdominal discomfort likely due to gaseous distention, resolved Hypokalemia/hypomagnesemia  Assessment & Plan:  Acute anterior STEMI s/p CABG x 2 Acute HFrEF with cardiogenic shock Continue aspirin and statin Diuretic therapy was stopped considering rising serum creatinine Remain on dobutamine at 5 Continue amiodarone Digoxin was kept on hold due to rising serum creatinine Monitor intake and output Continue TED stockings Patient was given 500 cc of LR Heart failure team TCTS are following  Exudative left-sided pleural effusion s/p thoracentesis Continue to monitor  Sepsis due to Pseudomonas bacteremia, likely urinary source Sepsis indices have improved White count started to trend down Continue meropenem Follow-up sensitivity results  AKI on CKD stage IIIa Serum creatinine has trended up significantly Holding diuretics Monitor intake and output Avoid nephrotoxic agents  Hyponatremia, hypervolemic Serum sodium is improving, currently at 132  Monitor electrolytes  GERD Continue  Protonix  Submassive PE on anticoagulation Continue IV heparin infusion for now, will switch to Eliquis once serum creatinine improves  Acute hypoxic respiratory failure in the setting of pulmonary edema and pleural effusion Oxygen was titrated down to 3 L Continue to titrate with O2 sat goal 92%  Prediabetes with hyperglycemia Hemoglobin A1c 6.2 Continue insulin with CBG goal 140-180  Best Practice (right click and "Reselect all SmartList Selections" daily)   Diet/type: Regular DVT prophylaxis: Systemic heparin GI prophylaxis: PPI Lines: N/A Foley: N/A Code Status:  full code Last date of multidisciplinary goals of care discussion [per primary]    Jacky Kindle, MD [place] See Amion for pager If no response to pager, please call [phone] until 7pm After 7pm, Please call E-link [phone]

## 2022-03-21 NOTE — Progress Notes (Signed)
Peripherally Inserted Central Catheter Placement  The IV Nurse has discussed with the patient and/or persons authorized to consent for the patient, the purpose of this procedure and the potential benefits and risks involved with this procedure.  The benefits include less needle sticks, lab draws from the catheter, and the patient may be discharged home with the catheter. Risks include, but not limited to, infection, bleeding, blood clot (thrombus formation), and puncture of an artery; nerve damage and irregular heartbeat and possibility to perform a PICC exchange if needed/ordered by physician.  Alternatives to this procedure were also discussed.  Bard Power PICC patient education guide, fact sheet on infection prevention and patient information card has been provided to patient /or left at bedside.  Camillo Flaming, RN Aware and agreeable to remove right AC PIV once tubings are changed and moved to PICC.  Left PIV to remain in at this time.  PICC Placement Documentation  PICC Double Lumen 03/21/22 Right Brachial 40 cm 1 cm (Active)  Indication for Insertion or Continuance of Line Vasoactive infusions 03/21/22 1212  Exposed Catheter (cm) 1 cm 03/21/22 1212  Site Assessment Clean, Dry, Intact 03/21/22 1212  Lumen #1 Status Flushed;Saline locked;Blood return noted 03/21/22 1212  Lumen #2 Status Flushed;Saline locked;Blood return noted 03/21/22 1212  Dressing Type Transparent;Securing device 03/21/22 1212  Dressing Status Antimicrobial disc in place;Clean, Dry, Intact 03/21/22 1212  Safety Lock Not Applicable 23/76/28 3151  Line Care Connections checked and tightened 03/21/22 1212  Line Adjustment (NICU/IV Team Only) No 03/21/22 1212  Dressing Intervention New dressing 03/21/22 1212  Dressing Change Due 03/28/22 03/21/22 [place], Nicolette Bang 03/21/2022, 12:13 PM

## 2022-03-21 NOTE — Progress Notes (Signed)
[address]       [place] [address]             [phone]      9 Days Post-Op  Procedure(s) (LRB): CORONARY ARTERY BYPASS GRAFTING (CABG) X TWO BYPASSES USING LEFT INTERNAL MAMMARY ARTERY AND RIGHT GREATER SAPHENOUS  VEIN HARVEST. (N/A) TRANSESOPHAGEAL ECHOCARDIOGRAM (TEE) (N/A)  Total Length of Stay:  LOS: 11 days   SUBJECTIVE: Had a good night up to this am with painful Right Toes unable to put pressure on it No SOB Walked full lap last night Diarrhea better  Vitals:   03/21/22 0600 03/21/22 0700  BP: 101/75 105/73  Pulse: 70 66  Resp: 20 19  Temp:    SpO2: 97% 97%    Intake/Output      01/17 0701 01/18 0700 01/18 0701 01/19 0700   P.O. 150    I.V. (mL/kg) 618.7 (7.1)    IV Piggyback 239.9    Total Intake(mL/kg) 1008.6 (11.6)    Urine (mL/kg/hr) 1080 (0.5)    Stool 0    Total Output 1080    Net -71.4         Stool Occurrence 1 x        sodium chloride     sodium chloride Stopped (03/12/22 2101)   DOBUTamine 5 mcg/kg/min (03/21/22 0700)   heparin 1,650 Units/hr (03/21/22 0700)   lactated ringers Stopped (03/12/22 2225)   lactated ringers Stopped (03/18/22 1837)   meropenem (MERREM) IV Stopped (03/20/22 2204)    CBC    Component Value Date/Time   WBC 25.4 (H) 03/21/2022 0645   RBC 3.82 (L) 03/21/2022 0645   HGB 11.4 (L) 03/21/2022 0645   HCT 33.3 (L) 03/21/2022 0645   PLT 316 03/21/2022 0645   MCV 87.2 03/21/2022 0645   MCH 29.8 03/21/2022 0645   MCHC 34.2 03/21/2022 0645   RDW 14.4 03/21/2022 0645   LYMPHSABS 1.3 03/10/2022 1155   MONOABS 1.3 (H) 03/10/2022 1155   EOSABS 0.1 03/10/2022 1155   BASOSABS 0.0 03/10/2022 1155   CMP     Component Value Date/Time   NA 130 (L) 03/20/2022 1639   K 3.8 03/20/2022 1639   CL 85 (L) 03/20/2022 1639   CO2 27 03/20/2022 1639   GLUCOSE 102 (H) 03/20/2022 1639   BUN 44 (H) 03/20/2022 1639   CREATININE 2.23 (H) 03/20/2022 1639   CALCIUM 8.9 03/20/2022 1639   PROT 6.1 (L)  03/19/2022 1305   ALBUMIN 3.7 03/10/2022 1155   AST 26 03/10/2022 1155   ALT 24 03/10/2022 1155   ALKPHOS 48 03/10/2022 1155   BILITOT 0.5 03/10/2022 1155   GFRNONAA 32 (L) 03/20/2022 1639   GFRAA >60 06/26/2017 0529   ABG    Component Value Date/Time   PHART 7.357 03/12/2022 1724   PCO2ART 34.4 03/12/2022 1724   PO2ART 164 (H) 03/12/2022 1724   HCO3 19.4 (L) 03/12/2022 1724   TCO2 20 (L) 03/12/2022 1724   ACIDBASEDEF 6.0 (H) 03/12/2022 1724   O2SAT 58 03/19/2022 0420   CBG (last 3)  Recent Labs    03/20/22 2031 03/20/22 2358 03/21/22 0358  GLUCAP 150* 87 91   EXAM Lungs: decreased at bases Card: RR  Incision: intact Ext: right first toe and laterally very tender to touch   ASSESSMENT: POD #9 SP CABG Hemodynamics stable but concerning for rising Cr Will push for PICC line to get Coox Steroids for possible gout of right foot. Get ortho  to aspirate in not improved Follow Cr for now Leave on [place]   Coralie Common, MD 03/21/2022

## 2022-03-22 DIAGNOSIS — R570 Cardiogenic shock: Secondary | ICD-10-CM | POA: Diagnosis not present

## 2022-03-22 DIAGNOSIS — I5021 Acute systolic (congestive) heart failure: Secondary | ICD-10-CM | POA: Diagnosis not present

## 2022-03-22 DIAGNOSIS — B965 Pseudomonas (aeruginosa) (mallei) (pseudomallei) as the cause of diseases classified elsewhere: Secondary | ICD-10-CM | POA: Diagnosis not present

## 2022-03-22 DIAGNOSIS — R7881 Bacteremia: Secondary | ICD-10-CM | POA: Diagnosis not present

## 2022-03-22 LAB — CBC
HCT: 29.3 % — ABNORMAL LOW (ref 39.0–52.0)
Hemoglobin: 10.1 g/dL — ABNORMAL LOW (ref 13.0–17.0)
MCH: 30.1 pg (ref 26.0–34.0)
MCHC: 34.5 g/dL (ref 30.0–36.0)
MCV: 87.2 fL (ref 80.0–100.0)
Platelets: 352 10*3/uL (ref 150–400)
RBC: 3.36 MIL/uL — ABNORMAL LOW (ref 4.22–5.81)
RDW: 14.6 % (ref 11.5–15.5)
WBC: 27.7 10*3/uL — ABNORMAL HIGH (ref 4.0–10.5)
nRBC: 0 % (ref 0.0–0.2)

## 2022-03-22 LAB — GLUCOSE, CAPILLARY
Glucose-Capillary: 101 mg/dL — ABNORMAL HIGH (ref 70–99)
Glucose-Capillary: 111 mg/dL — ABNORMAL HIGH (ref 70–99)
Glucose-Capillary: 120 mg/dL — ABNORMAL HIGH (ref 70–99)
Glucose-Capillary: 127 mg/dL — ABNORMAL HIGH (ref 70–99)
Glucose-Capillary: 140 mg/dL — ABNORMAL HIGH (ref 70–99)
Glucose-Capillary: 142 mg/dL — ABNORMAL HIGH (ref 70–99)

## 2022-03-22 LAB — BASIC METABOLIC PANEL
Anion gap: 14 (ref 5–15)
Anion gap: 12 (ref 5–15)
BUN: 68 mg/dL — ABNORMAL HIGH (ref 8–23)
BUN: 68 mg/dL — ABNORMAL HIGH (ref 8–23)
CO2: 26 mmol/L (ref 22–32)
CO2: 26 mmol/L (ref 22–32)
Calcium: 8.6 mg/dL — ABNORMAL LOW (ref 8.9–10.3)
Calcium: 8.7 mg/dL — ABNORMAL LOW (ref 8.9–10.3)
Chloride: 91 mmol/L — ABNORMAL LOW (ref 98–111)
Chloride: 94 mmol/L — ABNORMAL LOW (ref 98–111)
Creatinine, Ser: 3.48 mg/dL — ABNORMAL HIGH (ref 0.61–1.24)
Creatinine, Ser: 3.08 mg/dL — ABNORMAL HIGH (ref 0.61–1.24)
GFR, Estimated: 19 mL/min — ABNORMAL LOW (ref >60)
GFR, Estimated: 22 mL/min — ABNORMAL LOW (ref >60)
Glucose, Bld: 145 mg/dL — ABNORMAL HIGH (ref 70–99)
Glucose, Bld: 150 mg/dL — ABNORMAL HIGH (ref 70–99)
Potassium: 3.7 mmol/L (ref 3.5–5.1)
Potassium: 3.8 mmol/L (ref 3.5–5.1)
Sodium: 131 mmol/L — ABNORMAL LOW (ref 135–145)
Sodium: 132 mmol/L — ABNORMAL LOW (ref 135–145)

## 2022-03-22 LAB — COOXEMETRY PANEL
Carboxyhemoglobin: 1.2 % (ref 0.5–1.5)
Methemoglobin: <0.7 % (ref 0.0–1.5)
O2 Saturation: 62.7 %
Total hemoglobin: 10.8 g/dL — ABNORMAL LOW (ref 12.0–16.0)

## 2022-03-22 LAB — HEPARIN LEVEL (UNFRACTIONATED): Heparin Unfractionated: 0.65 IU/mL (ref 0.30–0.70)

## 2022-03-22 MED ORDER — SODIUM CHLORIDE 0.9 % IV SOLN
2.0000 g | INTRAVENOUS | Status: DC
  Administered 2022-03-22 – 2022-03-24 (×3): 2 g via INTRAVENOUS
  Filled 2022-03-22 (×3): qty 12.5

## 2022-03-22 MED ORDER — SORBITOL 70 % SOLN
30.0000 mL | Freq: Once | Status: AC
  Administered 2022-03-22: 30 mL via ORAL
  Filled 2022-03-22: qty 30

## 2022-03-22 NOTE — Progress Notes (Signed)
[address]       [place], [address]             [phone]      10 Days Post-Op  Procedure(s) (LRB): CORONARY ARTERY BYPASS GRAFTING (CABG) X TWO BYPASSES USING LEFT INTERNAL MAMMARY ARTERY AND RIGHT GREATER SAPHENOUS  VEIN HARVEST. (N/A) TRANSESOPHAGEAL ECHOCARDIOGRAM (TEE) (N/A)  Total Length of Stay:  LOS: 12 days   SUBJECTIVE: Feels good this am, less foot pain and no more nausea Ambulated  Vitals:   03/22/22 0500 03/22/22 0600  BP: 103/69 104/66  Pulse: 72 71  Resp: (!) 28 (!) 23  Temp:    SpO2: 98% 97%    Intake/Output      01/18 0701 01/19 0700 01/19 0701 01/20 0700   P.O.     I.V. (mL/kg) 531.4 (6.1)    IV Piggyback 100    Total Intake(mL/kg) 631.4 (7.2)    Urine (mL/kg/hr) 775 (0.4)    Stool     Total Output 775    Net -143.6         Urine Occurrence 1 x        ceFEPime (MAXIPIME) IV Stopped (03/21/22 2233)   DOBUTamine 5 mcg/kg/min (03/22/22 0600)   heparin 1,650 Units/hr (03/22/22 0600)    CBC    Component Value Date/Time   WBC 27.7 (H) 03/22/2022 0500   RBC 3.36 (L) 03/22/2022 0500   HGB 10.1 (L) 03/22/2022 0500   HCT 29.3 (L) 03/22/2022 0500   PLT 352 03/22/2022 0500   MCV 87.2 03/22/2022 0500   MCH 30.1 03/22/2022 0500   MCHC 34.5 03/22/2022 0500   RDW 14.6 03/22/2022 0500   LYMPHSABS 1.3 03/10/2022 1155   MONOABS 1.3 (H) 03/10/2022 1155   EOSABS 0.1 03/10/2022 1155   BASOSABS 0.0 03/10/2022 1155   CMP     Component Value Date/Time   NA 131 (L) 03/22/2022 0500   K 3.7 03/22/2022 0500   CL 91 (L) 03/22/2022 0500   CO2 26 03/22/2022 0500   GLUCOSE 145 (H) 03/22/2022 0500   BUN 68 (H) 03/22/2022 0500   CREATININE 3.48 (H) 03/22/2022 0500   CALCIUM 8.6 (L) 03/22/2022 0500   PROT 6.1 (L) 03/19/2022 1305   ALBUMIN 3.7 03/10/2022 1155   AST 26 03/10/2022 1155   ALT 24 03/10/2022 1155   ALKPHOS 48 03/10/2022 1155   BILITOT 0.5 03/10/2022 1155   GFRNONAA 19 (L) 03/22/2022 0500   GFRAA >60 06/26/2017  0529   ABG    Component Value Date/Time   PHART 7.357 03/12/2022 1724   PCO2ART 34.4 03/12/2022 1724   PO2ART 164 (H) 03/12/2022 1724   HCO3 19.4 (L) 03/12/2022 1724   TCO2 20 (L) 03/12/2022 1724   ACIDBASEDEF 6.0 (H) 03/12/2022 1724   O2SAT 53.1 03/21/2022 1440   CBG (last 3)  Recent Labs    03/21/22 1925 03/21/22 2339 03/22/22 0348  GLUCAP 149* 122* 101*  EXAM Lungs: decreased at bases Card: RR  Incision: clean and intact Abd: soft Right foot: less tender to touch   ASSESSMENT: POD #10 SP CABG Hemodynamics appear stable on Dobutamine. Will check Coox Renal: at least stable the last 12 hours. Responding to diuretics WBC: elevated (? Steroid effect)  aspirate from foot negative for bacteria Continue antibiotics. Will check another blood culture random today   Coralie Common, MD 03/22/2022

## 2022-03-22 NOTE — Progress Notes (Signed)
PHARMACY NOTE:  ANTIMICROBIAL RENAL DOSAGE ADJUSTMENT  Current antimicrobial regimen includes a mismatch between antimicrobial dosage and estimated renal function.  As per policy approved by the Pharmacy & Therapeutics and Medical Executive Committees, the antimicrobial dosage will be adjusted accordingly.  Current antimicrobial dosage:  cefepime 2g IV Q12H   Indication: Pseudomonas aeruginosa bacteremia   Renal Function:  Estimated Creatinine Clearance: 22.4 mL/min (A) (by C-G formula based on SCr of 3.48 mg/dL (H)). '[]'$      On intermittent HD, scheduled: '[]'$      On CRRT    Antimicrobial dosage has been changed to:  cefepime 2g IV Q24H   Additional comments:   Thank you for allowing pharmacy to be a part of this patient's care.  Adria Dill, PharmD PGY-2 Infectious Diseases Resident  03/22/2022 7:29 AM

## 2022-03-22 NOTE — Progress Notes (Signed)
Patient ID: Juan Snow, male   DOB: 11/25/1955, 67 y.o.   MRN: [id number]  TCTS Evening Rounds:  Hemodynamically stable on dobut 5.  Sats 94% 1L. UO ok, creat down slightly this afternoon.  BMET    Component Value Date/Time   NA 132 (L) 03/22/2022 1400   K 3.8 03/22/2022 1400   CL 94 (L) 03/22/2022 1400   CO2 26 03/22/2022 1400   GLUCOSE 150 (H) 03/22/2022 1400   BUN 68 (H) 03/22/2022 1400   CREATININE 3.08 (H) 03/22/2022 1400   CALCIUM 8.7 (L) 03/22/2022 1400   GFRNONAA 22 (L) 03/22/2022 1400

## 2022-03-22 NOTE — Progress Notes (Signed)
Occupational Therapy Treatment Patient Details Name: Juan Snow MRN: [id number] DOB: [birth year] Today's Date: 03/22/2022   History of present illness Juan Snow is an 67 y.o. male who presented to [facility] on 03/10/2022 with a chief complaint of CP/STEMI. 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 1/13 balloon pump removed. 1/16 Ultrasound of left chest showing small/moderate left pleural effusion & TEE. PMHx: arthritis, OA, GERD   OT comments  Pt declining OOB to chair, had already been in chair this morning, walked with nursing and completed ADLs. Used visit to instruct in sternal precautions related to ADLs and mobility, hands on knees for sit to stand, energy conservation strategies including sitting to shower and considerations for sitting surface height when he returns home. Pt with excellent family support. Pt is hopeful to be able to navigate stairs to sleep in his bed by discharge.   Recommendations for follow up therapy are one component of a multi-disciplinary discharge planning process, led by the attending physician.  Recommendations may be updated based on patient status, additional functional criteria and insurance authorization.    Follow Up Recommendations  Other (comment) (OP cardiac rehab)     Assistance Recommended at Discharge Intermittent Supervision/Assistance  Patient can return home with the following  A little help with walking and/or transfers;A little help with bathing/dressing/bathroom;Assistance with cooking/housework;Assist for transportation;Help with stairs or ramp for entrance   Equipment Recommendations  Tub/shower seat (does not think he will need 3 in 1)    Recommendations for Other Services      Precautions / Restrictions Precautions Precautions: Fall;Sternal Precaution Comments: educated in "move in the tube" and lifting precautions Restrictions Weight Bearing Restrictions: No        Mobility Bed Mobility     Rolling: Supervision     Sit to supine: Mod assist        Transfers                         Balance   Sitting-balance support: Feet supported Sitting balance-Leahy Scale: Good                                     ADL either performed or assessed with clinical judgement   ADL                                         General ADL Comments: Pt declining ADL training, PT following OT visit. Used visit time to educate pt and family in sternal precautions. energy conservation strategies and to consider height of sitting surfaces at home for ease in sit to stand.    Extremity/Trunk Assessment              Vision       Perception     Praxis      Cognition Arousal/Alertness: Awake/alert Behavior During Therapy: WFL for tasks assessed/performed Overall Cognitive Status: Within Functional Limits for tasks assessed                                          Exercises      Shoulder Instructions  General Comments      Pertinent Vitals/ Pain       Pain Assessment Pain Assessment: Faces Faces Pain Scale: Hurts a little bit Pain Location: R great toe Pain Descriptors / Indicators: Grimacing, Guarding, Discomfort Pain Intervention(s): Monitored during session  Home Living                                          Prior Functioning/Environment              Frequency  Min 2X/week        Progress Toward Goals  OT Goals(current goals can now be found in the care plan section)  Progress towards OT goals: Progressing toward goals  Acute Rehab OT Goals OT Goal Formulation: With patient Time For Goal Achievement: 04/02/22 Potential to Achieve Goals: Good  Plan Discharge plan remains appropriate    Co-evaluation                 AM-PAC OT "6 Clicks" Daily Activity     Outcome Measure   Help from another person eating meals?:  None Help from another person taking care of personal grooming?: A Little Help from another person toileting, which includes using toliet, bedpan, or urinal?: A Little Help from another person bathing (including washing, rinsing, drying)?: A Lot Help from another person to put on and taking off regular upper body clothing?: A Little Help from another person to put on and taking off regular lower body clothing?: A Lot 6 Click Score: 17    End of Session Equipment Utilized During Treatment: Oxygen  OT Visit Diagnosis: Unsteadiness on feet (R26.81);Other abnormalities of gait and mobility (R26.89);Muscle weakness (generalized) (M62.81);Pain Pain - Right/Left: Right   Activity Tolerance Patient tolerated treatment well   Patient Left in bed;with call bell/phone within reach;with bed alarm set;with family/visitor present   Nurse Communication          Time: 0626-9485 OT Time Calculation (min): 22 min  Charges: OT General Charges $OT Visit: 1 Visit OT Treatments $Self Care/Home Management : 8-22 mins  Cleta Alberts, OTR/L Acute Rehabilitation Services Office: [phone]   Malka So 03/22/2022, 1:53 PM

## 2022-03-22 NOTE — Progress Notes (Signed)
Physical Therapy Treatment Patient Details Name: Juan Snow MRN: [id number] DOB: 02/02/1956 Today's Date: 03/22/2022   History of Present Illness Juan Snow is an 67 y.o. male who presented to [facility] on 03/10/2022 with a chief complaint of CP/STEMI. 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 1/13 balloon pump removed. 1/16 Ultrasound of left chest showing small/moderate left pleural effusion & TEE. PMHx: arthritis, OA, GERD    PT Comments    Pt is demonstrating improved stability with standing mobility, now using a RW and only needing min guard assist for safety. Pt can be quick to transition sidelying to sit and to transfer to stand, resulting in trunk instability, needing cues to slow down and improve technique to improve his safety. Pt is progressing well but continues to display deficits in endurance, thus recommending a rollator at d/c. Current recommendations remain appropriate. Will continue to follow acutely.      Recommendations for follow up therapy are one component of a multi-disciplinary discharge planning process, led by the attending physician.  Recommendations may be updated based on patient status, additional functional criteria and insurance authorization.  Follow Up Recommendations  Other (comment) (OP Cardiac Rehab)     Assistance Recommended at Discharge PRN  Patient can return home with the following Assistance with cooking/housework;Assist for transportation;Help with stairs or ramp for entrance   Equipment Recommendations  Rollator (4 wheels)    Recommendations for Other Services       Precautions / Restrictions Precautions Precautions: Fall;Sternal Precaution Booklet Issued: Yes (comment) Precaution Comments: educated in "move in the tube" and lifting precautions Restrictions Weight Bearing Restrictions: Yes Other Position/Activity Restrictions: sternal precautions     Mobility   Bed Mobility Overal bed mobility: Needs Assistance Bed Mobility: Rolling, Sidelying to Sit Rolling: Supervision Sidelying to sit: Min guard, HOB elevated       General bed mobility comments: Pt cued to roll to R to sit up R EOB. Pt quick to move and a little unstable with raising his trunk, but no assistance needed. Min guard-supervision provided for safety    Transfers Overall transfer level: Needs assistance Equipment used: Rolling walker (2 wheels) Transfers: Sit to/from Stand Sit to Stand: Min guard, Min assist           General transfer comment: Pt coming to stand quickly with posterior trunk sway the first rep, needing minA to gain balance. Once cued to scoot to edge of seat for x5 further transfers, pt was able to perform with better control and stability, only needing min guard assist for safety. Pt keeping hands on lap to maintain sternal precautions with transfers.    Ambulation/Gait Ambulation/Gait assistance: Min guard Gait Distance (Feet): 180 Feet Assistive device: Rolling walker (2 wheels) Gait Pattern/deviations: Step-through pattern, Decreased stride length Gait velocity: decreased Gait velocity interpretation: <1.8 ft/sec, indicate of risk for recurrent falls   General Gait Details: Pt ambulating a slightly slowed but steady pace without LOB, min guard for safety. Cues provided for upright posture and gaze.   Stairs             Wheelchair Mobility    Modified Rankin (Stroke Patients Only)       Balance Overall balance assessment: Needs assistance Sitting-balance support: Feet supported Sitting balance-Leahy Scale: Good     Standing balance support: Bilateral upper extremity supported, During functional activity, No upper extremity supported Standing balance-Leahy Scale: Fair Standing balance comment: Reliant on  RW to ambulate, but able to stand statically without UE support                            Cognition  Arousal/Alertness: Awake/alert Behavior During Therapy: WFL for tasks assessed/performed Overall Cognitive Status: Within Functional Limits for tasks assessed                                          Exercises Other Exercises Other Exercises: serial sit <> stand 5x    General Comments General comments (skin integrity, edema, etc.): Poor pleth, reading as low as 70s% but then varied quickly to 90s% on 1-2L O2      Pertinent Vitals/Pain Pain Assessment Pain Assessment: Faces Faces Pain Scale: Hurts a little bit Pain Location: R great toe Pain Descriptors / Indicators: Grimacing Pain Intervention(s): Limited activity within patient's tolerance, Monitored during session    Home Living                          Prior Function            PT Goals (current goals can now be found in the care plan section) Acute Rehab PT Goals Patient Stated Goal: get stronger, more stable, walk more PT Goal Formulation: With patient/family Time For Goal Achievement: 04/02/22 Potential to Achieve Goals: Good Progress towards PT goals: Progressing toward goals    Frequency    Min 3X/week      PT Plan Equipment recommendations need to be updated    Co-evaluation              AM-PAC PT "6 Clicks" Mobility   Outcome Measure  Help needed turning from your back to your side while in a flat bed without using bedrails?: A Little Help needed moving from lying on your back to sitting on the side of a flat bed without using bedrails?: A Little Help needed moving to and from a bed to a chair (including a wheelchair)?: A Little Help needed standing up from a chair using your arms (e.g., wheelchair or bedside chair)?: A Little Help needed to walk in hospital room?: A Little Help needed climbing 3-5 steps with a railing? : A Lot 6 Click Score: 17    End of Session Equipment Utilized During Treatment: Oxygen;Gait belt Activity Tolerance: Patient tolerated treatment  well Patient left: with call bell/phone within reach;in chair;with family/visitor present Nurse Communication: Mobility status PT Visit Diagnosis: Unsteadiness on feet (R26.81);Pain;Difficulty in walking, not elsewhere classified (R26.2);Other abnormalities of gait and mobility (R26.89) Pain - part of body:  (chest)     Time: 7616-0737 PT Time Calculation (min) (ACUTE ONLY): 31 min  Charges:  $Gait Training: 8-22 mins $Therapeutic Activity: 8-22 mins                     Moishe Spice, PT, DPT Acute Rehabilitation Services  Office: [place] 03/22/2022, 5:17 PM

## 2022-03-22 NOTE — Progress Notes (Addendum)
ANTICOAGULATION CONSULT NOTE - Follow Up Consult  Pharmacy Consult for IV heparin Indication: IABP + hx DVT/PE  No Known Allergies  Patient Measurements: Height: '5\' 7"'$  (170.2 cm) Weight: 87.6 kg (193 lb 2 oz) IBW/kg (Calculated) : 66.1 Heparin Dosing Weight: 84 kg  Vital Signs: Temp: 98.7 F (37.1 C) (01/19 0355) Temp Source: Axillary (01/19 0355) BP: 104/66 (01/19 0600) Pulse Rate: 71 (01/19 0600)  Labs: Recent Labs    03/20/22 0546 03/20/22 1639 03/21/22 0645 03/21/22 1819 03/22/22 0500  HGB 11.8*  --  11.4*  --  10.1*  HCT 33.1*  --  33.3*  --  29.3*  PLT 255  --  316  --  352  HEPARINUNFRC 0.16* 0.41 0.41  --  0.65  CREATININE 1.74* 2.23* 2.76* 3.40* 3.48*    Estimated Creatinine Clearance: 22.4 mL/min (A) (by C-G formula based on SCr of 3.48 mg/dL (H)).   Medications:  Infusions:   ceFEPime (MAXIPIME) IV     DOBUTamine 5 mcg/kg/min (03/22/22 0600)   heparin 1,650 Units/hr (03/22/22 0600)    Assessment: 67 yo male CABG, now with IABP. Also with hx PE/DVT. Pharmacy asked to begin low-dose IV heparin gtt. No overt bleeding noted. Initial heparin level checked prior to heparin initiation is undetectable and last Xarelto dose PTA 1/7. Heparin resumed after IABP removal 1/13, targeting low goal given recent sternotomy.    Heparin level is slightly supratherpautic at 0.65, on heparin infusion at 1650 units/hr. PLTs (352) and Hgb (10.8) stable. No s/sx of bleeding or infusion issues.   Goal of Therapy:  Heparin level 0.3-0.5 units/ml Monitor platelets by anticoagulation protocol: Yes   Plan:  Decrease heparin infusion to 1550 units/hr Monitor heparin level, platelets, hemoglobin, and s/sx of bleeding daily  Freddie Breech, PharmD Candidate 03/22/2022,7:56 AM

## 2022-03-22 NOTE — Progress Notes (Signed)
NAME:  Juan Snow, MRN:  [id number], DOB:  1955/08/31, LOS: 30 ADMISSION DATE:  03/10/2022, CONSULTATION DATE:  03/12/22 REFERRING MD:  Kipp Brood, CHIEF COMPLAINT:  CABG   History of Present Illness:  67 year old man presented with chest pain on 03/10/22 found to have STEMI and severe 2V CAD, IABP placed with improvement in anginal symptoms and he was seen by cardiothoracic surgery.  Underwent CABG x 2 today (LIMA-LAD, SVG-ramus) with intra-op course complicated by difficulty weaning off bypass due to high PA pressures and low LV function; with iNO and high dose inotropes+pressors, able to be eventually weaned off pump.  Brought to ICU on pressors, inotropes, iNO, IABP, vent.  PCCM consulted to assist with management.   Pertinent  Medical History  GERD Prior VTE on [facility] PTA Arthtritis  Significant Hospital Events: Including procedures, antibiotic start and stop dates in addition to other pertinent events   1/7 admit 1/9 CABG x 2, saphenous graft harvest 1/10 weaning inotropic and nitric oxide support 1/11 extubated  11/13 intracardiac balloon pump removed. 1/15 worsening leukocytosis. Vanc and cefepime started. Cultures sent  1/16 multiple electrolyte issues, requiring aggressive potassium replacement.  Still on Lasix drip.  Creatinine stable.  Procalcitonin remains elevated.  Ultrasound of left chest showing small/moderate left pleural effusion with plan to proceed with thoracentesis and remove left triple-lumen catheter for line holiday.  Vancomycin stopped, preliminary blood cultures with Pseudomonas.  Left thoracentesis exudative by analysis, 200 mL removed 1/17 changing to oral diuresis, having gas discomfort but otherwise okay  Interim History / Subjective:  Stated left foot pain is better Remained afebrile Still on dobutamine  Objective   Blood pressure 104/66, pulse 71, temperature 98.7 F (37.1 C), temperature source Axillary, resp. rate (!) 23, height '5\' 7"'$  (1.702 m), weight  87.6 kg, SpO2 97 %. CVP:  [1 mmHg-54 mmHg] 18 mmHg      Intake/Output Summary (Last 24 hours) at 03/22/2022 0742 Last data filed at 03/22/2022 0600 Gross per 24 hour  Intake 631.44 ml  Output 775 ml  Net -143.56 ml   Filed Weights   03/20/22 0500 03/21/22 0500 03/22/22 0500  Weight: 87.9 kg 87.3 kg 87.6 kg    Examination: Physical exam: General: Elderly male, sitting on the recliner HEENT: [place]/AT, eyes anicteric.  moist mucus membranes Neuro: Alert, awake following commands Chest: Coarse breath sounds, no wheezes or rhonchi Heart: Regular rate and rhythm, no murmurs or gallops Abdomen: Soft, nontender, nondistended, bowel sounds present Skin: No rash  Labs: Coox 63% Sodium 131 Potassium 3.7 BUN 68 Serum creatinine 3.48 WBC 27 H/H10.1/29.3 Platelet 352  Resolved issues   Abdominal discomfort likely due to gaseous distention, resolved Hypokalemia/hypomagnesemia Acute hypoxic respiratory failure in the setting of pulmonary edema and pleural effusion  Assessment & Plan:  Acute anterior STEMI s/p CABG x 2 Acute HFrEF with cardiogenic shock Continue aspirin and statin CVP 7 Continue to hold diuretics Start slow dobutamine wean per advanced heart failure team Continue amiodarone Off digoxin Monitor intake and output Continue TED stockings Heart failure team TCTS are following  Exudative left-sided pleural effusion s/p thoracentesis Continue to monitor  Sepsis due to Pseudomonas bacteremia, likely urinary source Sepsis improved White count is slightly up due to steroid therapy Sensitivity on blood cultures showed pansensitive Pseudomonas, meropenem was switched back to cefepime  AKI on CKD stage IIIa Serum creatinine has trended up significantly I think now it is plateauing Holding diuretics Monitor intake and output Avoid nephrotoxic agents  Hyponatremia, hypervolemic  Serum sodium is improving, currently at 131 Monitor electrolytes  GERD Continue  Protonix  Submassive PE on anticoagulation Continue IV heparin infusion for now, will switch to DOAC once serum creatinine improves  Prediabetes Blood sugars are controlled  Continue insulin with CBG goal 140-180  Best Practice (right click and "Reselect all SmartList Selections" daily)   Diet/type: Regular DVT prophylaxis: Systemic heparin GI prophylaxis: PPI Lines: N/A Foley: N/A Code Status:  full code Last date of multidisciplinary goals of care discussion [per primary]    Jacky Kindle, MD [facility] See Amion for pager If no response to pager, please call [phone] until 7pm After 7pm, Please call E-link [phone]

## 2022-03-22 NOTE — Progress Notes (Signed)
[facility] for Infectious Disease  Date of Admission:  03/10/2022       Lines: 1/18-c picc rue  Abx: 1/15-17; 1/18-c cefepime  1/17-18 meropenem 1/11-15 cefazolin 1/09-1/15 vanc    Other: 1/18-c pred   ASSESSMENT: Pseudomonas bacteremia Right mtp joint tenderness -all central lines/foley removed by 1/17 -abx changed to meropenem 1/17 -susceptibility back today 1/18 PsA sensitive to cefepime -wbc trending down today 1/18  Suspect could be removal of line contributing to wbc improvement vs normal wbc response taking a little longer   The right mtp joint pain/swelling x2 days since 1/17 concerning in setting of pseudomonal bsi. He never had gout before, and acute gout joint can commonly have infection in setting bacteremia seeding. Would aspirate joint for culture to rule this out. 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 If patient remains here can finish course of cefepime on 1/31 If he discharge prior to 1/31 can finish course with ciprofloxacin renal dosing equivalent of 750 mg po bid We'll follow up final joint aspirate cx and adjust plan as needed should it grow pseudomonas Discussed with primary team/cts  I spent more than 35 minute reviewing data/chart, and coordinating care and >50% direct face to face time providing counseling/discussing diagnostics/treatment plan with patient   Principal Problem:   STEMI (ST elevation myocardial infarction) ([facility]) Active Problems:   AKI (acute kidney injury) ([place])   Cardiogenic shock ([place])   Acute systolic heart failure ([facility])   CAD in native artery   Pleural effusion on left   Bacteremia   Arthritis of first MTP joint   No Known  Allergies  Scheduled Meds:  amiodarone  200 mg Oral BID   aspirin EC  325 mg Oral Daily   Or   aspirin  324 mg Per Tube Daily   atorvastatin  80 mg Oral Daily   Chlorhexidine Gluconate Cloth  6 each Topical Daily   docusate sodium  200 mg Oral Daily   famotidine  20 mg Oral Daily   insulin aspart  3-9 Units Subcutaneous Q4H   insulin detemir  12 Units Subcutaneous Q12H   lidocaine  2 patch Transdermal Daily   predniSONE  40 mg Oral Q breakfast   sodium chloride flush  10-40 mL Intracatheter Q12H   sodium chloride flush  3 mL Intravenous Q12H   Continuous Infusions:  ceFEPime (MAXIPIME) IV     DOBUTamine 5 mcg/kg/min (03/22/22 0600)   heparin 1,550 Units/hr (03/22/22 0943)   PRN Meds:.morphine injection, mouth rinse, oxyCODONE, prochlorperazine, simethicone, sodium chloride, sodium chloride flush, sodium chloride flush, traMADol   SUBJECTIVE: Improved joint pain right first mtp joint Cx ngtd joint and no crystal No other complaint    Review of Systems: ROS All other ROS was negative, except mentioned above     OBJECTIVE: Vitals:   03/22/22 1345 03/22/22 1400 03/22/22 1415 03/22/22 1430  BP:      Pulse: 76 79 73 74  Resp: (!) '26 20 20 20  '$ Temp:      TempSrc:      SpO2: 94%  94% 94%  Weight:      Height:       Body  mass index is 30.25 kg/m.  Physical Exam General/constitutional: no distress, pleasant HEENT: Normocephalic, PER, Conj Clear, EOMI, Oropharynx clear Neck supple CV: rrr no mrg Lungs: clear to auscultation, normal respiratory effort Abd: Soft, Nontender Ext: no edema Skin: No Rash -- incision intact chest Neuro: nonfocal  Central line presence: right upper ext picc site no bleeding   MSK: right mtp joint extremely tender on passive rom; no warmth or swelling    Lab Results Lab Results  Component Value Date   WBC 27.7 (H) 03/22/2022   HGB 10.1 (L) 03/22/2022   HCT 29.3 (L) 03/22/2022   MCV 87.2 03/22/2022   PLT 352 03/22/2022     Lab Results  Component Value Date   CREATININE 3.08 (H) 03/22/2022   BUN 68 (H) 03/22/2022   NA 132 (L) 03/22/2022   K 3.8 03/22/2022   CL 94 (L) 03/22/2022   CO2 26 03/22/2022    Lab Results  Component Value Date   ALT 24 03/10/2022   AST 26 03/10/2022   ALKPHOS 48 03/10/2022   BILITOT 0.5 03/10/2022      Microbiology: Recent Results (from the past 240 hour(s))  Culture, blood (Routine X 2) w Reflex to ID Panel     Status: Abnormal   Collection Time: 03/18/22  8:33 AM   Specimen: BLOOD RIGHT HAND  Result Value Ref Range Status   Specimen Description BLOOD RIGHT HAND  Final   Special Requests   Final    BOTTLES DRAWN AEROBIC ONLY Blood Culture results may not be optimal due to an inadequate volume of blood received in culture bottles   Culture  Setup Time   Final    GRAM NEGATIVE RODS BOTTLES DRAWN AEROBIC ONLY CRITICAL VALUE NOTED.  VALUE IS CONSISTENT WITH PREVIOUSLY REPORTED AND CALLED VALUE.    Culture (A)  Final    PSEUDOMONAS AERUGINOSA SUSCEPTIBILITIES PERFORMED ON PREVIOUS CULTURE WITHIN THE LAST 5 DAYS. Performed at [facility], [place] [address] [place], [place] [address]    Report Status 03/21/2022 FINAL  Final  Culture, blood (Routine X 2) w Reflex to ID Panel     Status: Abnormal   Collection Time: 03/18/22  8:33 AM   Specimen: BLOOD LEFT HAND  Result Value Ref Range Status   Specimen Description BLOOD LEFT HAND  Final   Special Requests   Final    BOTTLES DRAWN AEROBIC AND ANAEROBIC Blood Culture adequate volume   Culture  Setup Time   Final    GRAM NEGATIVE RODS AEROBIC BOTTLE ONLY Organism ID to follow CRITICAL RESULT CALLED TO, READ BACK BY AND VERIFIED WITH: pharmd Ferne Coe [id number] at 1412 by cm Performed at [facility], [place] [address] [place], [place] [address]    Culture PSEUDOMONAS AERUGINOSA (A)  Final   Report Status 03/21/2022 FINAL  Final   Organism ID, Bacteria PSEUDOMONAS AERUGINOSA  Final      Susceptibility    Pseudomonas aeruginosa - MIC*    CEFTAZIDIME 4 SENSITIVE Sensitive     CIPROFLOXACIN <=0.25 SENSITIVE Sensitive     GENTAMICIN <=1 SENSITIVE Sensitive     IMIPENEM 2 SENSITIVE Sensitive     PIP/TAZO 8 SENSITIVE Sensitive     CEFEPIME 2 SENSITIVE Sensitive     * PSEUDOMONAS AERUGINOSA  Blood Culture ID Panel (Reflexed)     Status: Abnormal   Collection Time: 03/18/22  8:33 AM  Result Value Ref Range Status   Enterococcus faecalis NOT DETECTED NOT DETECTED Final  Enterococcus Faecium NOT DETECTED NOT DETECTED Final   Listeria monocytogenes NOT DETECTED NOT DETECTED Final   Staphylococcus species NOT DETECTED NOT DETECTED Final   Staphylococcus aureus (BCID) NOT DETECTED NOT DETECTED Final   Staphylococcus epidermidis NOT DETECTED NOT DETECTED Final   Staphylococcus lugdunensis NOT DETECTED NOT DETECTED Final   Streptococcus species NOT DETECTED NOT DETECTED Final   Streptococcus agalactiae NOT DETECTED NOT DETECTED Final   Streptococcus pneumoniae NOT DETECTED NOT DETECTED Final   Streptococcus pyogenes NOT DETECTED NOT DETECTED Final   A.calcoaceticus-baumannii NOT DETECTED NOT DETECTED Final   Bacteroides fragilis NOT DETECTED NOT DETECTED Final   Enterobacterales NOT DETECTED NOT DETECTED Final   Enterobacter cloacae complex NOT DETECTED NOT DETECTED Final   Escherichia coli NOT DETECTED NOT DETECTED Final   Klebsiella aerogenes NOT DETECTED NOT DETECTED Final   Klebsiella oxytoca NOT DETECTED NOT DETECTED Final   Klebsiella pneumoniae NOT DETECTED NOT DETECTED Final   Proteus species NOT DETECTED NOT DETECTED Final   Salmonella species NOT DETECTED NOT DETECTED Final   Serratia marcescens NOT DETECTED NOT DETECTED Final   Haemophilus influenzae NOT DETECTED NOT DETECTED Final   Neisseria meningitidis NOT DETECTED NOT DETECTED Final   Pseudomonas aeruginosa DETECTED (A) NOT DETECTED Final    Comment: CRITICAL RESULT CALLED TO, READ BACK BY AND VERIFIED WITH: PHARMD E MARTIN  [id number] AT 1413 BY CM    Stenotrophomonas maltophilia NOT DETECTED NOT DETECTED Final   Candida albicans NOT DETECTED NOT DETECTED Final   Candida auris NOT DETECTED NOT DETECTED Final   Candida glabrata NOT DETECTED NOT DETECTED Final   Candida krusei NOT DETECTED NOT DETECTED Final   Candida parapsilosis NOT DETECTED NOT DETECTED Final   Candida tropicalis NOT DETECTED NOT DETECTED Final   Cryptococcus neoformans/gattii NOT DETECTED NOT DETECTED Final   CTX-M ESBL NOT DETECTED NOT DETECTED Final   Carbapenem resistance IMP NOT DETECTED NOT DETECTED Final   Carbapenem resistance KPC NOT DETECTED NOT DETECTED Final   Carbapenem resistance NDM NOT DETECTED NOT DETECTED Final   Carbapenem resistance VIM NOT DETECTED NOT DETECTED Final    Comment: Performed at [facility], [address] [place], [place] [address]  MRSA Next Gen by PCR, Nasal     Status: None   Collection Time: 03/19/22  8:40 AM   Specimen: Nasal Mucosa; Nasal Swab  Result Value Ref Range Status   MRSA by PCR Next Gen NOT DETECTED NOT DETECTED Final    Comment: (NOTE) The GeneXpert MRSA Assay (FDA approved for NASAL specimens only), is one component of a comprehensive MRSA colonization surveillance program. It is not intended to diagnose MRSA infection nor to guide or monitor treatment for MRSA infections. Test performance is not FDA approved in patients less than 56 years old. Performed at [facility], [place] [address] [place], [place] [address]   Body fluid culture w Gram Stain     Status: None (Preliminary result)   Collection Time: 03/21/22  4:09 PM   Specimen: PATH Cytology Misc. fluid; Body Fluid  Result Value Ref Range Status   Specimen Description SYNOVIAL  Final   Special Requests NONE  Final   Gram Stain   Final    ABUNDANT WBC PRESENT,BOTH PMN AND MONONUCLEAR NO ORGANISMS SEEN    Culture   Final    NO GROWTH < 24 HOURS Performed at [facility], [place] [address] [place], [place]  [address]    Report Status PENDING  Incomplete  Serology:   Imaging: If present, new imagings (plain films, ct scans, and mri) have been personally visualized and interpreted; radiology reports have been reviewed. Decision making incorporated into the Impression / Recommendations.  1/16 xray chest Decreased left pleural effusion with trace residual. No pneumothorax.   1/12 xray chest 1. Interval removal of the endotracheal and enteric tubes with decreased lung volumes and bibasilar atelectasis. 2. Otherwise unchanged support lines and tubes. 3. Trace bilateral pleural effusions.   1/07 xray chest 1. Intra-aortic balloon pump radiopaque marker in the anticipated region of the proximal descending aorta. 2. Low lung volumes with mild atelectasis at the lung bases. 3. Small left pleural effusion.   1/08 tte  1. Left ventricular ejection fraction, by estimation, is 25 to 30%. The  left ventricle has severely decreased function. The left ventricle  demonstrates regional wall motion abnormalities (see scoring  diagram/findings for description). Left ventricular  diastolic parameters are consistent with Grade II diastolic dysfunction  (pseudonormalization).   2. Right ventricular systolic function is normal. The right ventricular  size is normal. There is mildly elevated pulmonary artery systolic  pressure.   3. The mitral valve is normal in structure. Trivial mitral valve  regurgitation. No evidence of mitral stenosis.   4. The aortic valve is tricuspid. Aortic valve regurgitation is not  visualized. No aortic stenosis is present.   5. The inferior vena cava is normal in size with greater than 50%  respiratory variability, suggesting right atrial pressure of 3 mmHg.   Comparison(s): Prior images reviewed side by side. The left ventricular  function is significantly worse. The right ventricular systolic function  has improved.    Jabier Mutton, [facility] for Infectious  [place] [phone] pager    03/22/2022, 3:07 PM

## 2022-03-22 NOTE — Progress Notes (Addendum)
Patient ID: Juan Snow, male   DOB: 1955-12-28, 67 y.o.   MRN: [id number]   Advanced Heart Failure Rounding Note  PCP-Cardiologist: None   Subjective:    S/p CABG x 2 POD #10 1/18 Creatinine bumped. Over diuresed. Creatinine bumped. Given IV fluids.  Had  right foot pain. Uric 1acid 6. Started prednisone. ID restarted cefepime.   Currently on 5 DBA. CO-OX 63%. No CO-OX with line holiday.    BC + for pseudomonas. Source felt to be PNA or urine. Line holiday. SWAN introducer pulled 01/16.   Feels better today. Able to walk around the unit.  Objective:   Weight Range: 87.6 kg Body mass index is 30.25 kg/m.   Vital Signs:   Temp:  [97.5 F (36.4 C)-99.6 F (37.6 C)] 97.9 F (36.6 C) (01/19 0812) Pulse Rate:  [68-79] 71 (01/19 0600) Resp:  [12-31] 23 (01/19 0600) BP: (101-146)/(44-119) 104/66 (01/19 0600) SpO2:  [78 %-100 %] 97 % (01/19 0600) Weight:  [87.6 kg] 87.6 kg (01/19 0500) Last BM Date : 03/20/22  Weight change: Filed Weights   03/20/22 0500 03/21/22 0500 03/22/22 0500  Weight: 87.9 kg 87.3 kg 87.6 kg    Intake/Output:   Intake/Output Summary (Last 24 hours) at 03/22/2022 1007 Last data filed at 03/22/2022 0600 Gross per 24 hour  Intake 631.44 ml  Output 775 ml  Net -143.56 ml     CVP 7 Physical Exam  General:  In the chair. No resp difficulty HEENT: normal Neck: supple. no JVD. Carotids 2+ bilat; no bruits. No lymphadenopathy or thryomegaly appreciated. Cor: PMI nondisplaced. Regular rate & rhythm. No rubs, gallops or murmurs. Lungs: clear Abdomen: soft, nontender, nondistended. No hepatosplenomegaly. No bruits or masses. Good bowel sounds. Extremities: no cyanosis, clubbing, rash, edema. RUE PICC  Neuro: alert & orientedx3, cranial nerves grossly intact. moves all 4 extremities w/o difficulty. Affect pleasant    Telemetry  SR 60-70s   Labs    CBC Recent Labs    03/21/22 0645 03/22/22 0500  WBC 25.4* 27.7*  HGB 11.4* 10.1*  HCT 33.3*  29.3*  MCV 87.2 87.2  PLT 316 867   Basic Metabolic Panel Recent Labs    03/21/22 1819 03/22/22 0500  NA 131* 131*  K 4.2 3.7  CL 88* 91*  CO2 27 26  GLUCOSE 170* 145*  BUN 64* 68*  CREATININE 3.40* 3.48*  CALCIUM 8.5* 8.6*   Liver Function Tests Recent Labs    03/19/22 1305  PROT 6.1*    No results for input(s): "LIPASE", "AMYLASE" in the last 72 hours. Cardiac Enzymes No results for input(s): "CKTOTAL", "CKMB", "CKMBINDEX", "TROPONINI" in the last 72 hours.  BNP: BNP (last 3 results) Recent Labs    05/11/21 1052  BNP 23.1    ProBNP (last 3 results) No results for input(s): "PROBNP" in the last 8760 hours.   D-Dimer No results for input(s): "DDIMER" in the last 72 hours. Hemoglobin A1C No results for input(s): "HGBA1C" in the last 72 hours.  Fasting Lipid Panel No results for input(s): "CHOL", "HDL", "LDLCALC", "TRIG", "CHOLHDL", "LDLDIRECT" in the last 72 hours.  Thyroid Function Tests No results for input(s): "TSH", "T4TOTAL", "T3FREE", "THYROIDAB" in the last 72 hours.  Invalid input(s): "FREET3"  Other results:   Imaging    DG FLUORO GUIDED NEEDLE PLC ASPIRATION/INJECTION LOC  Result Date: 03/21/2022 CLINICAL DATA:  Pseudomonas bacteremia, Right great toe pain, swelling, redness EXAM: RIGHT FIRST MTP ASPIRATION UNDER FLUOROSCOPY FLUOROSCOPY: 0.2 mGy TECHNIQUE: The procedure, risks (  including but not limited to bleeding, infection, structural damage, pain, inability to access the joint), benefits, and alternatives were explained to the patient. Questions regarding the procedure were encouraged and answered. The patient understands and consents to the procedure. An appropriate skin entry site was determined under fluoroscopy. Site was marked, prepped and draped in usual sterile fashion, and infiltrated locally with 1% lidocaine. A 25 gauge needle was advanced into the joint and a fluoroscopic image was saved for documentation. 0.2cc of synovial fluid  was aspirated from the joint. The needle was removed and site bandaged. The patient tolerated procedure well. COMPLICATIONS: None immediate IMPRESSION: 1. Technically successful right first MTP aspiration under fluoroscopy yielding 0.2cc synovial fluid. Specimen sent to the lab as requested by the team. Read and performed by: Alexandria Lodge, PA-C Electronically Signed   By: Corrie Mckusick D.O.   On: 03/21/2022 17:19     Medications:     Scheduled Medications:  amiodarone  200 mg Oral BID   aspirin EC  325 mg Oral Daily   Or   aspirin  324 mg Per Tube Daily   atorvastatin  80 mg Oral Daily   Chlorhexidine Gluconate Cloth  6 each Topical Daily   docusate sodium  200 mg Oral Daily   famotidine  20 mg Oral Daily   insulin aspart  3-9 Units Subcutaneous Q4H   insulin detemir  12 Units Subcutaneous Q12H   lidocaine  2 patch Transdermal Daily   predniSONE  40 mg Oral Q breakfast   sodium chloride flush  10-40 mL Intracatheter Q12H   sodium chloride flush  3 mL Intravenous Q12H    Infusions:  ceFEPime (MAXIPIME) IV     DOBUTamine 5 mcg/kg/min (03/22/22 0600)   heparin 1,550 Units/hr (03/22/22 0943)    PRN Medications: morphine injection, mouth rinse, oxyCODONE, prochlorperazine, simethicone, sodium chloride, sodium chloride flush, sodium chloride flush, traMADol    Patient Profile   67 y/o male w/ h/o submassive PE in March 2023 w/ subsequent RV dysfunction that recovered post treatment with catheter directed thrombolysis, on Xarelto, admitted w/ large acute anterolateral STEMI c/b acute systolic heart failure/shock requiring placement of IABP. Now s/p CABG. AHF team consulted to assist w/ management of HF. Also COVID +   Assessment/Plan   1. Cardiogenic shock/ischemic cardiomyopathy: Echo pre-op with LV EF 25-30%, wall motion abnormalities, normal RV.  IABP placed pre-op for management of angina.  Intra-op, patient developed acute worsening of LV and RV function as well as acute  rise in PA pressure (PASBP 80s-90s).  Difficult to wean off bypass, but eventually successful wean with iNO, inotropes, pressors, IABP.  After prolonged course, pressors weaned off and IABP removed.  Currently on 5 DBA. CO-OX stable 63%. Continue DBA at 5 . No change. Slow wean.  -Yesterday creatinine bumped. Suspect over diuresed. Diuretics held and given IV fluids.  -  CVP stable 7 - Continue to hold diuretics.  -Off digoxin with AKI -TED hose  2. CAD: Anterolateral MI with cath showing 80% ostial LAD stenosis and 80% ostial large ramus stenosis.  Ostial lesions in close proximity made PCI very difficult and CABG was elected.  Patient underwent LIMA-LAD and SVG-ramus  - ASA - Continue statin.   3. H/o PE: Submassive PE in 3/23, treated with catheter-directed thrombolysis.  - On heparin gtt, will need eventual oral anticoagulation.   4. COVID-19+: Incidental finding at admission. Not on antivirals.   5. Acute hypoxemic respiratory failure: Extubated. Now on room air  with stable saturations.   6. Thrombocytopenia: Platelets as low as 57K now improved suspect post-op though IABP may have played role.  - Resolved.   7. Anemia: Post-op. Hgb stable at 10.  8. Atrial ectopy: PACs and SVT runs, have not seen definite atrial fibrillation.  - SR today.  - Continue po amio 200 BID  9. Leukocytosis: -WBC 9.4>31>25>27K. AF. Of note he was started on prednisone 03/21/22 -BC grew pseudomonas. Source thought to be urine or PNA. -UA large leukocytes, WBC >50 and rare bacteria - L thoracentesis 01/16 with removal 200 cc fluid -Now on meropenem -PICC replaced   10. Hyponatremia, hypervolemic:  - F/u labs  11. Right foot pain: - Suspect possible gout - Uric acid 16. Day 2 of prednisone.    Amy Clegg NP-C  03/22/2022 10:07 AM  Patient seen and examined with the above-signed Advanced Practice Provider and/or Housestaff. I personally reviewed laboratory data, imaging studies and relevant notes.  I independently examined the patient and formulated the important aspects of the plan. I have edited the note to reflect any of my changes or salient points. I have personally discussed the plan with the patient and/or family.  Remains on DBA. Co-ox looks good. CVP 8-9.  On IV abx. WBC coming down. Rec'd IVF yesterday. Scr improving. L MTP feels better after prednisone. Aspirate  negative  General: Sitting in chair No resp difficulty HEENT: normal Neck: supple. no JVD. Carotids 2+ bilat; no bruits. No lymphadenopathy or thryomegaly appreciated. Cor: Sternal wound ok  Regular rate & rhythm. No rubs, gallops or murmurs. Lungs: clear Abdomen: soft, nontender, nondistended. No hepatosplenomegaly. No bruits or masses. Good bowel sounds. Extremities: no cyanosis, clubbing, rash, edema Neuro: alert & orientedx3, cranial nerves grossly intact. moves all 4 extremities w/o difficulty. Affect pleasant  Continues to improve. Will hold diuretics. Begin DBA wean tomorrow if renal function stable/improved.  Continue abx. Continue to ambulate.   Glori Bickers, MD  5:23 PM

## 2022-03-23 DIAGNOSIS — I5021 Acute systolic (congestive) heart failure: Secondary | ICD-10-CM | POA: Diagnosis not present

## 2022-03-23 DIAGNOSIS — N179 Acute kidney failure, unspecified: Secondary | ICD-10-CM | POA: Diagnosis not present

## 2022-03-23 DIAGNOSIS — R570 Cardiogenic shock: Secondary | ICD-10-CM | POA: Diagnosis not present

## 2022-03-23 DIAGNOSIS — R7881 Bacteremia: Secondary | ICD-10-CM | POA: Diagnosis not present

## 2022-03-23 DIAGNOSIS — I2102 ST elevation (STEMI) myocardial infarction involving left anterior descending coronary artery: Secondary | ICD-10-CM | POA: Diagnosis not present

## 2022-03-23 LAB — GLUCOSE, CAPILLARY
Glucose-Capillary: 81 mg/dL (ref 70–99)
Glucose-Capillary: 126 mg/dL — ABNORMAL HIGH (ref 70–99)
Glucose-Capillary: 113 mg/dL — ABNORMAL HIGH (ref 70–99)
Glucose-Capillary: 122 mg/dL — ABNORMAL HIGH (ref 70–99)
Glucose-Capillary: 63 mg/dL — ABNORMAL LOW (ref 70–99)
Glucose-Capillary: 84 mg/dL (ref 70–99)
Glucose-Capillary: 135 mg/dL — ABNORMAL HIGH (ref 70–99)
Glucose-Capillary: 119 mg/dL — ABNORMAL HIGH (ref 70–99)

## 2022-03-23 LAB — BASIC METABOLIC PANEL
Anion gap: 12 (ref 5–15)
BUN: 65 mg/dL — ABNORMAL HIGH (ref 8–23)
CO2: 27 mmol/L (ref 22–32)
Calcium: 8.9 mg/dL (ref 8.9–10.3)
Chloride: 96 mmol/L — ABNORMAL LOW (ref 98–111)
Creatinine, Ser: 2.56 mg/dL — ABNORMAL HIGH (ref 0.61–1.24)
GFR, Estimated: 27 mL/min — ABNORMAL LOW (ref >60)
Glucose, Bld: 114 mg/dL — ABNORMAL HIGH (ref 70–99)
Potassium: 3.6 mmol/L (ref 3.5–5.1)
Sodium: 135 mmol/L (ref 135–145)

## 2022-03-23 LAB — CBC
HCT: 30 % — ABNORMAL LOW (ref 39.0–52.0)
Hemoglobin: 10.1 g/dL — ABNORMAL LOW (ref 13.0–17.0)
MCH: 30.2 pg (ref 26.0–34.0)
MCHC: 33.7 g/dL (ref 30.0–36.0)
MCV: 89.8 fL (ref 80.0–100.0)
Platelets: 442 10*3/uL — ABNORMAL HIGH (ref 150–400)
RBC: 3.34 MIL/uL — ABNORMAL LOW (ref 4.22–5.81)
RDW: 15 % (ref 11.5–15.5)
WBC: 25.8 10*3/uL — ABNORMAL HIGH (ref 4.0–10.5)
nRBC: 0.1 % (ref 0.0–0.2)

## 2022-03-23 LAB — COOXEMETRY PANEL
Carboxyhemoglobin: 1.3 % (ref 0.5–1.5)
Methemoglobin: <0.7 % (ref 0.0–1.5)
O2 Saturation: 58 %
Total hemoglobin: 10.8 g/dL — ABNORMAL LOW (ref 12.0–16.0)

## 2022-03-23 LAB — HEPARIN LEVEL (UNFRACTIONATED): Heparin Unfractionated: 0.5 IU/mL (ref 0.30–0.70)

## 2022-03-23 MED ORDER — POTASSIUM CHLORIDE CRYS ER 10 MEQ PO TBCR
10.0000 meq | EXTENDED_RELEASE_TABLET | Freq: Once | ORAL | Status: AC
  Administered 2022-03-23: 10 meq via ORAL
  Filled 2022-03-23: qty 1

## 2022-03-23 MED ORDER — APIXABAN 5 MG PO TABS
5.0000 mg | ORAL_TABLET | Freq: Two times a day (BID) | ORAL | Status: DC
  Administered 2022-03-23 – 2022-03-28 (×10): 5 mg via ORAL
  Filled 2022-03-23 (×10): qty 1

## 2022-03-23 MED ORDER — ASPIRIN 81 MG PO TBEC
81.0000 mg | DELAYED_RELEASE_TABLET | Freq: Every day | ORAL | Status: DC
  Administered 2022-03-24 – 2022-03-28 (×5): 81 mg via ORAL
  Filled 2022-03-23 (×5): qty 1

## 2022-03-23 NOTE — Progress Notes (Signed)
ID Brief Note       2 d ago   SYNOVIAL   NONE   ABUNDANT WBC PRESENT,BOTH PMN AND MONONUCLEAR NO ORGANISMS SEEN   NO GROWTH 2 DAYS Performed at [facility], [address], [place], [place] [address]   PENDING   [facility] CLIN LAB      Rosiland Oz, MD Infectious Disease Physician [facility] [address] [place], [place] [address] Phone: [phone]  Fax: [phone]

## 2022-03-23 NOTE — Progress Notes (Signed)
NAME:  JOMO FORAND, MRN:  [id number], DOB:  Sep 23, 1955, LOS: 74 ADMISSION DATE:  03/10/2022, CONSULTATION DATE:  03/12/22 REFERRING MD:  Kipp Brood, CHIEF COMPLAINT:  CABG   History of Present Illness:  67 year old man presented with chest pain on 03/10/22 found to have STEMI and severe 2V CAD, IABP placed with improvement in anginal symptoms and he was seen by cardiothoracic surgery.  Underwent CABG x 2 today (LIMA-LAD, SVG-ramus) with intra-op course complicated by difficulty weaning off bypass due to high PA pressures and low LV function; with iNO and high dose inotropes+pressors, able to be eventually weaned off pump.  Brought to ICU on pressors, inotropes, iNO, IABP, vent.  PCCM consulted to assist with management.   Pertinent  Medical History  GERD Prior VTE on [facility] PTA Arthtritis  Significant Hospital Events: Including procedures, antibiotic start and stop dates in addition to other pertinent events   1/7 admit 1/9 CABG x 2, saphenous graft harvest 1/10 weaning inotropic and nitric oxide support 1/11 extubated  11/13 intracardiac balloon pump removed. 1/15 worsening leukocytosis. Vanc and cefepime started. Cultures sent  1/16 multiple electrolyte issues, requiring aggressive potassium replacement.  Still on Lasix drip.  Creatinine stable.  Procalcitonin remains elevated.  Ultrasound of left chest showing small/moderate left pleural effusion with plan to proceed with thoracentesis and remove left triple-lumen catheter for line holiday.  Vancomycin stopped, preliminary blood cultures with Pseudomonas.  Left thoracentesis exudative by analysis, 200 mL removed 1/17 changing to oral diuresis, having gas discomfort but otherwise okay  Interim History / Subjective:  Remain afebrile White count started trending down Coox 58% Serum creatinine started improving  Objective   Blood pressure 108/72, pulse 78, temperature 97.8 F (36.6 C), temperature source Oral, resp. rate (!) 24, height 5'  7" (1.702 m), weight 87.6 kg, SpO2 91 %. CVP:  [2 mmHg-24 mmHg] 9 mmHg      Intake/Output Summary (Last 24 hours) at 03/23/2022 1347 Last data filed at 03/23/2022 1200 Gross per 24 hour  Intake 741.62 ml  Output 1450 ml  Net -708.38 ml   Filed Weights   03/20/22 0500 03/21/22 0500 03/22/22 0500  Weight: 87.9 kg 87.3 kg 87.6 kg    Examination: Physical exam: General: Elderly male, lying on the bed HEENT: [place]/AT, eyes anicteric.  moist mucus membranes Neuro: Alert, awake following commands Chest: Coarse breath sounds, no wheezes or rhonchi Heart: Regular rate and rhythm, no murmurs or gallops Abdomen: Soft, nontender, nondistended, bowel sounds present Skin: No rash   Labs: Coox 58% Sodium 135 Potassium 3.6 BUN 65 Serum creatinine 2.56 WBC 25.8 H/H10.1/30 Platelet 442  Resolved issues   Abdominal discomfort likely due to gaseous distention, resolved Hypokalemia/hypomagnesemia Acute hypoxic respiratory failure in the setting of pulmonary edema and pleural effusion Exudative left-sided pleural effusion s/p thoracentesis  Assessment & Plan:  Acute anterior STEMI s/p CABG x 2 Acute HFrEF with cardiogenic shock Continue aspirin and statin CVP 10 Continue to hold diuretics Continue to titrate dobutamine, it was dropped to 3 from 5 per advanced heart failure team Continue amiodarone Hold digoxin Monitor intake and output Continue TED stockings Heart failure team TCTS are following  Sepsis due to pansensitive Pseudomonas bacteremia, likely urinary source Sepsis improved White count remains elevated Appreciate ID recommendations, recommend continuing cefepime until 1/31  AKI on CKD stage IIIa Serum creatinine started trending down again, currently at 2.56 Holding diuretics Monitor intake and output Avoid nephrotoxic agents  Hyponatremia, hypervolemic, corrected Serum sodium is improving, currently  at 135 Monitor electrolytes  GERD Continue  Protonix  Submassive PE on anticoagulation Off heparin now Once pacer wires are removed Will start him on Eliquis later today  Prediabetes Blood sugars are controlled  Continue insulin with CBG goal 140-180  Best Practice (right click and "Reselect all SmartList Selections" daily)   Diet/type: Regular DVT prophylaxis: Systemic heparin GI prophylaxis: PPI Lines: N/A Foley: N/A Code Status:  full code Last date of multidisciplinary goals of care discussion [per primary]    Jacky Kindle, MD [facility] See Amion for pager If no response to pager, please call [phone] until 7pm After 7pm, Please call E-link [phone]

## 2022-03-23 NOTE — Progress Notes (Signed)
ANTICOAGULATION CONSULT NOTE - Follow Up Consult  Pharmacy Consult for IV heparin Indication: IABP + hx DVT/PE  No Known Allergies  Patient Measurements: Height: '5\' 7"'$  (170.2 cm) Weight: 87.6 kg (193 lb 2 oz) IBW/kg (Calculated) : 66.1 Heparin Dosing Weight: 84 kg  Vital Signs: Temp: 98.1 F (36.7 C) (01/20 0415) Temp Source: Axillary (01/20 0415) BP: 103/70 (01/20 0600) Pulse Rate: 72 (01/20 0600)  Labs: Recent Labs    03/21/22 0645 03/21/22 1819 03/22/22 0500 03/22/22 1400 03/23/22 0500  HGB 11.4*  --  10.1*  --  10.1*  HCT 33.3*  --  29.3*  --  30.0*  PLT 316  --  352  --  442*  HEPARINUNFRC 0.41  --  0.65  --  0.50  CREATININE 2.76*   < > 3.48* 3.08* 2.56*   < > = values in this interval not displayed.     Estimated Creatinine Clearance: 30.4 mL/min (A) (by C-G formula based on SCr of 2.56 mg/dL (H)).   Medications:  Infusions:   ceFEPime (MAXIPIME) IV Stopped (03/22/22 2213)   DOBUTamine 5 mcg/kg/min (03/23/22 0600)   heparin 1,550 Units/hr (03/23/22 0600)    Assessment: 67 yo male CABG, now with IABP. Also with hx PE/DVT. Pharmacy asked to begin low-dose IV heparin gtt. No overt bleeding noted. Initial heparin level checked prior to heparin initiation is undetectable and last Xarelto dose PTA 1/7. Heparin resumed after IABP removal 1/13, targeting low goal given recent sternotomy.    Heparin level is therapeutic at 0.5, on 1550 units/hr. Hgb stable at 10.1, plt 442. No s/sx of bleeding or infusion issues.   Goal of Therapy:  Heparin level 0.3-0.5 units/ml Monitor platelets by anticoagulation protocol: Yes   Plan:  Continue heparin infusion at 1550 units/hr Monitor heparin level, platelets, hemoglobin, and s/sx of bleeding daily  Antonietta Jewel, PharmD, BCCCP Clinical Pharmacist  Phone: [phone] 03/23/2022 7:23 AM  Please check AMION for all [place] phone numbers After 10:00 PM, call [place] [phone]  ADDENDUM Discussed with team -  will change to apixaban tonight. Plan to stop heparin and remove pacing wires - will start apixaban tonight. Since last VTE event was <6 months ago, will start apixaban 5 mg BID.   Antonietta Jewel, PharmD, [place] Clinical Pharmacist

## 2022-03-23 NOTE — Progress Notes (Signed)
11 Days Post-Op Procedure(s) (LRB): CORONARY ARTERY BYPASS GRAFTING (CABG) X TWO BYPASSES USING LEFT INTERNAL MAMMARY ARTERY AND RIGHT GREATER SAPHENOUS  VEIN HARVEST. (N/A) TRANSESOPHAGEAL ECHOCARDIOGRAM (TEE) (N/A) Subjective:  Feels better today. Ambulated well this am. No SOB.  Objective: Vital signs in last 24 hours: Temp:  [97.7 F (36.5 C)-98.4 F (36.9 C)] 98.1 F (36.7 C) (01/20 0415) Pulse Rate:  [71-82] 73 (01/20 0900) Cardiac Rhythm: Normal sinus rhythm (01/20 0800) Resp:  [16-31] 26 (01/20 0900) BP: (97-138)/(70-91) 113/80 (01/20 0900) SpO2:  [79 %-99 %] 95 % (01/20 0900)  Hemodynamic parameters for last 24 hours: CVP:  [2 mmHg-24 mmHg] 10 mmHg  Intake/Output from previous day: 01/19 0701 - 01/20 0700 In: 635.3 [I.V.:535.5; IV Piggyback:99.9] Out: 1475 [Urine:1475] Intake/Output this shift: Total I/O In: 44 [I.V.:44] Out: 275 [Urine:275]  General appearance: alert and cooperative Neurologic: intact Heart: regular rate and rhythm, S1, S2 normal, no murmur Lungs: clear to auscultation bilaterally Extremities: edema mild Wound: chest incision ok  Lab Results: Recent Labs    03/22/22 0500 03/23/22 0500  WBC 27.7* 25.8*  HGB 10.1* 10.1*  HCT 29.3* 30.0*  PLT 352 442*   BMET:  Recent Labs    03/22/22 1400 03/23/22 0500  NA 132* 135  K 3.8 3.6  CL 94* 96*  CO2 26 27  GLUCOSE 150* 114*  BUN 68* 65*  CREATININE 3.08* 2.56*  CALCIUM 8.7* 8.9    PT/INR: No results for input(s): "LABPROT", "INR" in the last 72 hours. ABG    Component Value Date/Time   PHART 7.357 03/12/2022 1724   HCO3 19.4 (L) 03/12/2022 1724   TCO2 20 (L) 03/12/2022 1724   ACIDBASEDEF 6.0 (H) 03/12/2022 1724   O2SAT 58 03/23/2022 0756   CBG (last 3)  Recent Labs    03/22/22 2342 03/23/22 0412 03/23/22 0824  GLUCAP 111* 81 126*    Assessment/Plan: S/P Procedure(s) (LRB): CORONARY ARTERY BYPASS GRAFTING (CABG) X TWO BYPASSES USING LEFT INTERNAL MAMMARY ARTERY AND  RIGHT GREATER SAPHENOUS  VEIN HARVEST. (N/A) TRANSESOPHAGEAL ECHOCARDIOGRAM (TEE) (N/A)  POD 11  Hemodynamically stable on dobut 5. Co-ox 58 this am. CVP 7. Dobut wean per AHF.  AKI: creat improved to 2.56 this am. Holding diuresis. Wt is about 6 lbs over preop.  H/O PE 05/2021. On Xarelto previously, no heparin postop. Will stop heparin this am, wait a couple hrs and remove pacing wires. Plan to start Eliquis this evening.   LOS: 13 days    Gaye Pollack 03/23/2022

## 2022-03-23 NOTE — Progress Notes (Signed)
Patient ID: Juan Snow, male   DOB: 1955/12/04, 67 y.o.   MRN: [id number]  Hemodynamically stable in sinus rhythm. Dobut down to 3 mcg.  Continues to have good UO without diuretic.  Ambulated.

## 2022-03-23 NOTE — Progress Notes (Signed)
Patient ID: Juan Snow, male   DOB: [birth year], 67 y.o.   MRN: [id number]   Advanced Heart Failure Rounding Note  PCP-Cardiologist: None   Subjective:    S/p CABG x 2 POD 1/9 1/18 Creatinine bumped. Over diuresed. Creatinine bumped. Given IV fluids.  Had  right foot pain. Uric 1acid 6. Started prednisone. ID restarted cefepime.   Currently on 5 DBA. CO-OX 58%. SCr 3.1 -> 2.5 CVP 10  Feels better. No CP or SOB.   Good BM with sorbitol    Objective:   Weight Range: 87.6 kg Body mass index is 30.25 kg/m.   Vital Signs:   Temp:  [97.7 F (36.5 C)-98.4 F (36.9 C)] 98.1 F (36.7 C) (01/20 0415) Pulse Rate:  [71-82] 76 (01/20 0800) Resp:  [16-31] 25 (01/20 0800) BP: (97-138)/(70-91) 108/79 (01/20 0800) SpO2:  [79 %-99 %] 91 % (01/20 0800) Last BM Date : 03/22/22  Weight change: Filed Weights   03/20/22 0500 03/21/22 0500 03/22/22 0500  Weight: 87.9 kg 87.3 kg 87.6 kg    Intake/Output:   Intake/Output Summary (Last 24 hours) at 03/23/2022 0926 Last data filed at 03/23/2022 0800 Gross per 24 hour  Intake 679.36 ml  Output 1550 ml  Net -870.64 ml       Physical Exam   General:  Sitting in chair. No resp difficulty HEENT: normal Neck: supple. no JVD. Carotids 2+ bilat; no bruits. No lymphadenopathy or thryomegaly appreciated. Cor: Sternal wound ok Regular rate & rhythm. No rubs, gallops or murmurs. Lungs: clear Abdomen: soft, nontender, nondistended. No hepatosplenomegaly. No bruits or masses. Good bowel sounds. Extremities: no cyanosis, clubbing, rash, edema Neuro: alert & orientedx3, cranial nerves grossly intact. moves all 4 extremities w/o difficulty. Affect pleasant   Telemetry   SR 70s Personally reviewed  Labs    CBC Recent Labs    03/22/22 0500 03/23/22 0500  WBC 27.7* 25.8*  HGB 10.1* 10.1*  HCT 29.3* 30.0*  MCV 87.2 89.8  PLT 352 442*    Basic Metabolic Panel Recent Labs    03/22/22 1400 03/23/22 0500  NA 132* 135  K 3.8 3.6   CL 94* 96*  CO2 26 27  GLUCOSE 150* 114*  BUN 68* 65*  CREATININE 3.08* 2.56*  CALCIUM 8.7* 8.9    Liver Function Tests No results for input(s): "AST", "ALT", "ALKPHOS", "BILITOT", "PROT", "ALBUMIN" in the last 72 hours.   No results for input(s): "LIPASE", "AMYLASE" in the last 72 hours. Cardiac Enzymes No results for input(s): "CKTOTAL", "CKMB", "CKMBINDEX", "TROPONINI" in the last 72 hours.  BNP: BNP (last 3 results) Recent Labs    05/11/21 1052  BNP 23.1     ProBNP (last 3 results) No results for input(s): "PROBNP" in the last 8760 hours.   D-Dimer No results for input(s): "DDIMER" in the last 72 hours. Hemoglobin A1C No results for input(s): "HGBA1C" in the last 72 hours.  Fasting Lipid Panel No results for input(s): "CHOL", "HDL", "LDLCALC", "TRIG", "CHOLHDL", "LDLDIRECT" in the last 72 hours.  Thyroid Function Tests No results for input(s): "TSH", "T4TOTAL", "T3FREE", "THYROIDAB" in the last 72 hours.  Invalid input(s): "FREET3"  Other results:   Imaging    No results found.   Medications:     Scheduled Medications:  amiodarone  200 mg Oral BID   aspirin EC  325 mg Oral Daily   Or   aspirin  324 mg Per Tube Daily   atorvastatin  80 mg Oral Daily   Chlorhexidine Gluconate  Cloth  6 each Topical Daily   docusate sodium  200 mg Oral Daily   famotidine  20 mg Oral Daily   insulin aspart  3-9 Units Subcutaneous Q4H   insulin detemir  12 Units Subcutaneous Q12H   lidocaine  2 patch Transdermal Daily   predniSONE  40 mg Oral Q breakfast   sodium chloride flush  10-40 mL Intracatheter Q12H   sodium chloride flush  3 mL Intravenous Q12H    Infusions:  ceFEPime (MAXIPIME) IV Stopped (03/22/22 2213)   DOBUTamine 5 mcg/kg/min (03/23/22 0800)   heparin 1,550 Units/hr (03/23/22 0800)    PRN Medications: morphine injection, mouth rinse, oxyCODONE, prochlorperazine, simethicone, sodium chloride, sodium chloride flush, sodium chloride flush,  traMADol    Patient Profile   67 y/o male w/ h/o submassive PE in March 2023 w/ subsequent RV dysfunction that recovered post treatment with catheter directed thrombolysis, on Xarelto, admitted w/ large acute anterolateral STEMI c/b acute systolic heart failure/shock requiring placement of IABP. Now s/p CABG. AHF team consulted to assist w/ management of HF. Also COVID +   Assessment/Plan   1. Cardiogenic shock/ischemic cardiomyopathy: Echo pre-op with LV EF 25-30%, wall motion abnormalities, normal RV.  IABP placed pre-op for management of angina.  Intra-op, patient developed acute worsening of LV and RV function as well as acute rise in PA pressure (PASBP 80s-90s).  Difficult to wean off bypass, but eventually successful wean with iNO, inotropes, pressors, IABP.  After prolonged course, pressors weaned off and IABP removed.   - Currently on 5 DBA. CO-OX 58%. Scr improving. Can drop DBA to 3 and follow co-ox  - CVP stable 7 - Continue to hold diuretics.  - Off digoxin with AKI -TED hose - Unable to use GDMT yet with AKI and need for inotropes. Add as tolerated  2. CAD: Anterolateral MI with cath showing 80% ostial LAD stenosis and 80% ostial large ramus stenosis.  Ostial lesions in close proximity made PCI very difficult and CABG was elected.  Patient underwent LIMA-LAD and SVG-ramus on 03/12/22 - No s/s angina - Continue ASA/statin. No Plavix with need AC - No b-blocker with shock - Eventual referral to CR  3. H/o PE: Submassive PE in 3/23, treated with catheter-directed thrombolysis.   - On heparin gtt, will need eventual oral anticoagulation.  - On Xarelto PTA. Will d/w TCTS about timing of Eliquis start. (Now POD #11)  4. COVID-19+: Incidental finding at admission.  - resolved  5. Acute hypoxemic respiratory failure:  - resolved  6. Thrombocytopenia: Platelets as low as 57K now improved suspect post-op though IABP may have played role.  - Resolved.   7. Atrial ectopy: PACs and  SVT runs, have not seen definite atrial fibrillation.  - SR today.  - stop amio once off DBA  8. Pseudomonas bacteremia -Was on meropenem -ID following and switched back to cefepime. Surveillance cx pending  - WBC 27.7 -> 25.8  9. Hyponatremia, hypervolemic:  - F/u labs  10. Acute gout R 1st MTP - Uric acid 16. Resolved with prednisone.    Glori Bickers MD 03/23/2022 9:26 AM

## 2022-03-24 DIAGNOSIS — N179 Acute kidney failure, unspecified: Secondary | ICD-10-CM | POA: Diagnosis not present

## 2022-03-24 DIAGNOSIS — I2102 ST elevation (STEMI) myocardial infarction involving left anterior descending coronary artery: Secondary | ICD-10-CM | POA: Diagnosis not present

## 2022-03-24 DIAGNOSIS — R570 Cardiogenic shock: Secondary | ICD-10-CM | POA: Diagnosis not present

## 2022-03-24 LAB — BASIC METABOLIC PANEL
Anion gap: 11 (ref 5–15)
BUN: 58 mg/dL — ABNORMAL HIGH (ref 8–23)
CO2: 23 mmol/L (ref 22–32)
Calcium: 8.8 mg/dL — ABNORMAL LOW (ref 8.9–10.3)
Chloride: 99 mmol/L (ref 98–111)
Creatinine, Ser: 2.08 mg/dL — ABNORMAL HIGH (ref 0.61–1.24)
GFR, Estimated: 35 mL/min — ABNORMAL LOW (ref >60)
Glucose, Bld: 121 mg/dL — ABNORMAL HIGH (ref 70–99)
Potassium: 4 mmol/L (ref 3.5–5.1)
Sodium: 133 mmol/L — ABNORMAL LOW (ref 135–145)

## 2022-03-24 LAB — CBC
HCT: 30.9 % — ABNORMAL LOW (ref 39.0–52.0)
Hemoglobin: 10.5 g/dL — ABNORMAL LOW (ref 13.0–17.0)
MCH: 30.8 pg (ref 26.0–34.0)
MCHC: 34 g/dL (ref 30.0–36.0)
MCV: 90.6 fL (ref 80.0–100.0)
Platelets: 452 10*3/uL — ABNORMAL HIGH (ref 150–400)
RBC: 3.41 MIL/uL — ABNORMAL LOW (ref 4.22–5.81)
RDW: 15.2 % (ref 11.5–15.5)
WBC: 21.9 10*3/uL — ABNORMAL HIGH (ref 4.0–10.5)
nRBC: 0.1 % (ref 0.0–0.2)

## 2022-03-24 LAB — GLUCOSE, CAPILLARY
Glucose-Capillary: 124 mg/dL — ABNORMAL HIGH (ref 70–99)
Glucose-Capillary: 149 mg/dL — ABNORMAL HIGH (ref 70–99)
Glucose-Capillary: 102 mg/dL — ABNORMAL HIGH (ref 70–99)
Glucose-Capillary: 146 mg/dL — ABNORMAL HIGH (ref 70–99)
Glucose-Capillary: 120 mg/dL — ABNORMAL HIGH (ref 70–99)

## 2022-03-24 LAB — COOXEMETRY PANEL
Carboxyhemoglobin: 1.6 % — ABNORMAL HIGH (ref 0.5–1.5)
Methemoglobin: 0.7 % (ref 0.0–1.5)
O2 Saturation: 60.5 %
Total hemoglobin: 10.6 g/dL — ABNORMAL LOW (ref 12.0–16.0)

## 2022-03-24 MED ORDER — PNEUMOCOCCAL 20-VAL CONJ VACC 0.5 ML IM SUSY: 0.5000 mL | PREFILLED_SYRINGE | INTRAMUSCULAR | Status: DC | PRN

## 2022-03-24 MED ORDER — FUROSEMIDE 10 MG/ML IJ SOLN
40.0000 mg | Freq: Once | INTRAMUSCULAR | Status: AC
  Administered 2022-03-24: 40 mg via INTRAVENOUS
  Filled 2022-03-24: qty 4

## 2022-03-24 MED ORDER — AMIODARONE HCL 200 MG PO TABS
200.0000 mg | ORAL_TABLET | Freq: Every day | ORAL | Status: DC
  Administered 2022-03-25 – 2022-03-28 (×4): 200 mg via ORAL
  Filled 2022-03-24 (×4): qty 1

## 2022-03-24 MED ORDER — ACETAMINOPHEN 325 MG PO TABS: 650.0000 mg | ORAL_TABLET | Freq: Four times a day (QID) | ORAL | Status: DC | PRN

## 2022-03-24 MED ORDER — INFLUENZA VAC A&B SA ADJ QUAD 0.5 ML IM PRSY: 0.5000 mL | PREFILLED_SYRINGE | INTRAMUSCULAR | Status: DC | PRN

## 2022-03-24 NOTE — Progress Notes (Addendum)
Patient ID: Juan Snow, male   DOB: 04/10/1955, 67 y.o.   MRN: [id number]   Advanced Heart Failure Rounding Note  PCP-Cardiologist: None   Subjective:    S/p CABG x 2 on 03/12/22. Pre-op EF 25-30%. Prolonged post-op course due to HF, AKI and pseudomonas bacteremia   Currently on DBA 3. CO-OX 61%. SCr 3.1 -> 2.5 -> 2.1 CVP 10  Feeling better., No CP or SOB.    Objective:   Weight Range: 85.3 kg Body mass index is 29.45 kg/m.   Vital Signs:   Temp:  [97.5 F (36.4 C)-98 F (36.7 C)] 97.5 F (36.4 C) (01/21 0740) Pulse Rate:  [66-81] 70 (01/21 0600) Resp:  [12-33] 19 (01/21 0700) BP: (93-124)/(68-90) 105/74 (01/21 0700) SpO2:  [88 %-96 %] 94 % (01/21 0600) Weight:  [85.3 kg] 85.3 kg (01/21 0500) Last BM Date : 03/22/22  Weight change: Filed Weights   03/21/22 0500 03/22/22 0500 03/24/22 0500  Weight: 87.3 kg 87.6 kg 85.3 kg    Intake/Output:   Intake/Output Summary (Last 24 hours) at 03/24/2022 0820 Last data filed at 03/24/2022 0700 Gross per 24 hour  Intake 356.94 ml  Output 1400 ml  Net -1043.06 ml       Physical Exam   General:  Sitting in chair. No resp difficulty HEENT: normal Neck: supple. JVP 8-9 Carotids 2+ bilat; no bruits. No lymphadenopathy or thryomegaly appreciated. Cor: Sternal wound ok  Regular rate & rhythm. No rubs, gallops or murmurs. Lungs: clear Abdomen: soft, nontender, nondistended. No hepatosplenomegaly. No bruits or masses. Good bowel sounds. Extremities: no cyanosis, clubbing, rash, edema Neuro: alert & orientedx3, cranial nerves grossly intact. moves all 4 extremities w/o difficulty. Affect pleasant   Telemetry   SR 70s Personally reviewed  Labs    CBC Recent Labs    03/23/22 0500 03/24/22 0340  WBC 25.8* 21.9*  HGB 10.1* 10.5*  HCT 30.0* 30.9*  MCV 89.8 90.6  PLT 442* 452*    Basic Metabolic Panel Recent Labs    03/23/22 0500 03/24/22 0340  NA 135 133*  K 3.6 4.0  CL 96* 99  CO2 27 23  GLUCOSE 114* 121*   BUN 65* 58*  CREATININE 2.56* 2.08*  CALCIUM 8.9 8.8*    Liver Function Tests No results for input(s): "AST", "ALT", "ALKPHOS", "BILITOT", "PROT", "ALBUMIN" in the last 72 hours.   No results for input(s): "LIPASE", "AMYLASE" in the last 72 hours. Cardiac Enzymes No results for input(s): "CKTOTAL", "CKMB", "CKMBINDEX", "TROPONINI" in the last 72 hours.  BNP: BNP (last 3 results) Recent Labs    05/11/21 1052  BNP 23.1     ProBNP (last 3 results) No results for input(s): "PROBNP" in the last 8760 hours.   D-Dimer No results for input(s): "DDIMER" in the last 72 hours. Hemoglobin A1C No results for input(s): "HGBA1C" in the last 72 hours.  Fasting Lipid Panel No results for input(s): "CHOL", "HDL", "LDLCALC", "TRIG", "CHOLHDL", "LDLDIRECT" in the last 72 hours.  Thyroid Function Tests No results for input(s): "TSH", "T4TOTAL", "T3FREE", "THYROIDAB" in the last 72 hours.  Invalid input(s): "FREET3"  Other results:   Imaging    No results found.   Medications:     Scheduled Medications:  amiodarone  200 mg Oral BID   apixaban  5 mg Oral BID   aspirin EC  81 mg Oral Daily   atorvastatin  80 mg Oral Daily   Chlorhexidine Gluconate Cloth  6 each Topical Daily   docusate sodium  200 mg Oral Daily   famotidine  20 mg Oral Daily   insulin aspart  3-9 Units Subcutaneous Q4H   insulin detemir  12 Units Subcutaneous Q12H   lidocaine  2 patch Transdermal Daily   sodium chloride flush  10-40 mL Intracatheter Q12H   sodium chloride flush  3 mL Intravenous Q12H    Infusions:  ceFEPime (MAXIPIME) IV Stopped (03/23/22 2310)   DOBUTamine 3 mcg/kg/min (03/24/22 0700)    PRN Medications: morphine injection, mouth rinse, oxyCODONE, prochlorperazine, simethicone, sodium chloride, sodium chloride flush, sodium chloride flush, traMADol    Patient Profile   67 y/o male w/ h/o submassive PE in March 2023 w/ subsequent RV dysfunction that recovered post treatment  with catheter directed thrombolysis, on Xarelto, admitted w/ large acute anterolateral STEMI c/b acute systolic heart failure/shock requiring placement of IABP. Now s/p CABG. AHF team consulted to assist w/ management of HF. Also COVID +   Assessment/Plan   1. Cardiogenic shock/ischemic cardiomyopathy: Echo pre-op with LV EF 25-30%, wall motion abnormalities, normal RV.  IABP placed pre-op for management of angina.  Intra-op, patient developed acute worsening of LV and RV function as well as acute rise in PA pressure (PASBP 80s-90s).  Difficult to wean off bypass, but eventually successful wean with iNO, inotropes, pressors, IABP.  After prolonged course, pressors weaned off and IABP removed.   - Currently on DBA 3. CO-OX 61%. Scr improving. Can drop DBA to 1 and follow co-ox. Hopefully stop tomorrow - Volume status slightly elevated. Give one dose lasix 40 IV today - Off digoxin with AKI - Unable to use GDMT yet with AKI and need for inotropes. Add as tolerated  2. CAD: Anterolateral MI with cath showing 80% ostial LAD stenosis and 80% ostial large ramus stenosis.  Ostial lesions in close proximity made PCI very difficult and CABG was elected.  Patient underwent LIMA-LAD and SVG-ramus on 03/12/22 - No s/s angina - Continue ASA/statin. No Plavix with need for AC - No b-blocker with shock - Eventual referral to CR  3. H/o PE: Submassive PE in 3/23, treated with catheter-directed thrombolysis.   - On Xarelto PTA. Eliquis started 03/23/22  4. COVID-19+: Incidental finding at admission.  - resolved  5. Acute hypoxemic respiratory failure:  - resolved  6. Thrombocytopenia: Platelets as low as 57K now improved suspect post-op though IABP may have played role.  - Resolved.   7. Atrial ectopy: PACs and SVT runs, have not seen definite atrial fibrillation.  - SR today.  - stop amio once off DBA. Will drop to 200 daily today  8. Pseudomonas bacteremia -Was on meropenem -ID following and switched  back to cefepime. Surveillance cx drawn 1/19 NGTD - WBC 27.7 -> 25.8 -> 21.9  9. Hyponatremia, hypervolemic:  - Na 133  10. Acute gout R 1st MTP - Uric acid 16. Resolved with prednisone.    Glori Bickers MD 03/24/2022 8:20 AM

## 2022-03-24 NOTE — Progress Notes (Signed)
Patient ID: Juan Snow, male   DOB: 1955/10/02, 67 y.o.   MRN: [id number]  TCTS Evening Rounds:  Hemodynamically stable on dobut 1 mcg.  UO ok.  Sats 92% 1L.  Stable day.

## 2022-03-24 NOTE — Progress Notes (Signed)
NAME:  Juan Snow, MRN:  [id number], DOB:  Nov 01, 1955, LOS: 38 ADMISSION DATE:  03/10/2022, CONSULTATION DATE:  03/12/22 REFERRING MD:  Kipp Brood, CHIEF COMPLAINT:  CABG   History of Present Illness:  67 year old man presented with chest pain on 03/10/22 found to have STEMI and severe 2V CAD, IABP placed with improvement in anginal symptoms and he was seen by cardiothoracic surgery.  Underwent CABG x 2 today (LIMA-LAD, SVG-ramus) with intra-op course complicated by difficulty weaning off bypass due to high PA pressures and low LV function; with iNO and high dose inotropes+pressors, able to be eventually weaned off pump.  Brought to ICU on pressors, inotropes, iNO, IABP, vent.  PCCM consulted to assist with management.   Pertinent  Medical History  GERD Prior VTE on [facility] PTA Arthtritis  Significant Hospital Events: Including procedures, antibiotic start and stop dates in addition to other pertinent events   1/7 admit 1/9 CABG x 2, saphenous graft harvest 1/10 weaning inotropic and nitric oxide support 1/11 extubated  11/13 intracardiac balloon pump removed. 1/15 worsening leukocytosis. Vanc and cefepime started. Cultures sent  1/16 multiple electrolyte issues, requiring aggressive potassium replacement.  Still on Lasix drip.  Creatinine stable.  Procalcitonin remains elevated.  Ultrasound of left chest showing small/moderate left pleural effusion with plan to proceed with thoracentesis and remove left triple-lumen catheter for line holiday.  Vancomycin stopped, preliminary blood cultures with Pseudomonas.  Left thoracentesis exudative by analysis, 200 mL removed 1/17 changing to oral diuresis, having gas discomfort but otherwise okay  Interim History / Subjective:  Remain afebrile White count continue to trend down Stated feeling better Coox 61% Serum creatinine continue to improve  Objective   Blood pressure 105/74, pulse 70, temperature (!) 97.5 F (36.4 C), temperature source Oral,  resp. rate 19, height '5\' 7"'$  (1.702 m), weight 85.3 kg, SpO2 94 %. CVP:  [8 mmHg-11 mmHg] 9 mmHg      Intake/Output Summary (Last 24 hours) at 03/24/2022 0802 Last data filed at 03/24/2022 0700 Gross per 24 hour  Intake 356.94 ml  Output 1400 ml  Net -1043.06 ml   Filed Weights   03/21/22 0500 03/22/22 0500 03/24/22 0500  Weight: 87.3 kg 87.6 kg 85.3 kg    Examination: Physical exam: General: Elderly male, sitting on recliner HEENT: [place]/AT, eyes anicteric.  moist mucus membranes Neuro: Alert, awake following commands Chest: Coarse breath sounds, no wheezes or rhonchi Heart: Regular rate and rhythm, no murmurs or gallops Abdomen: Soft, nontender, nondistended, bowel sounds present Skin: No rash   Labs: Coox 61% Sodium 133 Potassium 4 BUN 58 Serum creatinine 2.0 WBC 22 H/H10.5/31 Platelet 452  Resolved issues   Abdominal discomfort likely due to gaseous distention, resolved Hypokalemia/hypomagnesemia Acute hypoxic respiratory failure in the setting of pulmonary edema and pleural effusion Exudative left-sided pleural effusion s/p thoracentesis Sepsis  Assessment & Plan:  Acute anterior STEMI s/p CABG x 2 Acute HFrEF with cardiogenic shock Continue aspirin and statin CVP 10 Continue to hold diuretics, as patient is making good amount of urine Continue to titrate dobutamine, remain at 3 mics overnight with stable coox, now it has dropped to 1 mic per discussion with advanced heart failure team Continue amiodarone Continue to hold digoxin Monitor intake and output Continue TED stockings  Pansensitive Pseudomonas bacteremia, likely urinary source White count started trending down Remained afebrile Continue cefepime until 1/31 per discussion with ID, if patient goes home before this date, he will be discharge with p.o. ciprofloxacin  AKI  on CKD stage IIIa Serum creatinine continue to improve, currently at 2.08 Holding diuretics Monitor intake and output Avoid  nephrotoxic agents  Hyponatremia, hypervolemic, corrected Serum sodium remained stable in 130s Monitor electrolytes  GERD Continue Protonix  Submassive PE on anticoagulation Continue Eliquis  Prediabetes Blood sugars are controlled  Continue insulin with CBG goal 140-180  Best Practice (right click and "Reselect all SmartList Selections" daily)   Diet/type: Regular DVT prophylaxis: DOAC GI prophylaxis: PPI Lines: N/A Foley: N/A Code Status:  full code Last date of multidisciplinary goals of care discussion [per primary]    Jacky Kindle, MD [place] [facility] See Amion for pager If no response to pager, please call [phone] until 7pm After 7pm, Please call E-link [phone]

## 2022-03-24 NOTE — Progress Notes (Signed)
ID brief note   Component 3 d ago  Specimen Description SYNOVIAL  Special Requests NONE  Gram Stain ABUNDANT WBC PRESENT,BOTH PMN AND MONONUCLEAR NO ORGANISMS SEEN  Culture NO GROWTH 3 DAYS Performed at [facility], [place] [address] [place], [place] [address]  Report Status PENDING   Rosiland Oz, MD Infectious Disease Physician [facility] [address] [place], [place] [address] Phone: [phone]  Fax: [phone]

## 2022-03-24 NOTE — Progress Notes (Signed)
12 Days Post-Op Procedure(s) (LRB): CORONARY ARTERY BYPASS GRAFTING (CABG) X TWO BYPASSES USING LEFT INTERNAL MAMMARY ARTERY AND RIGHT GREATER SAPHENOUS  VEIN HARVEST. (N/A) TRANSESOPHAGEAL ECHOCARDIOGRAM (TEE) (N/A) Subjective: No complaints. Ambulating well.  Objective: Vital signs in last 24 hours: Temp:  [97.5 F (36.4 C)-98 F (36.7 C)] 97.5 F (36.4 C) (01/21 0740) Pulse Rate:  [66-81] 70 (01/21 0600) Cardiac Rhythm: Normal sinus rhythm (01/21 0400) Resp:  [12-33] 19 (01/21 0700) BP: (93-124)/(68-90) 105/74 (01/21 0700) SpO2:  [88 %-96 %] 94 % (01/21 0600) Weight:  [85.3 kg] 85.3 kg (01/21 0500)  Hemodynamic parameters for last 24 hours: CVP:  [8 mmHg-11 mmHg] 9 mmHg  Intake/Output from previous day: 01/20 0701 - 01/21 0700 In: 401 [P.O.:120; I.V.:181; IV Piggyback:100] Out: 1475 [Urine:1475] Intake/Output this shift: No intake/output data recorded.  General appearance: alert and cooperative Neurologic: intact Heart: regular rate and rhythm, S1, S2 normal, no murmur Lungs: clear to auscultation bilaterally Abdomen: soft, non-tender; bowel sounds normal Extremities: no edema Wound: incision healing well.  Lab Results: Recent Labs    03/23/22 0500 03/24/22 0340  WBC 25.8* 21.9*  HGB 10.1* 10.5*  HCT 30.0* 30.9*  PLT 442* 452*   BMET:  Recent Labs    03/23/22 0500 03/24/22 0340  NA 135 133*  K 3.6 4.0  CL 96* 99  CO2 27 23  GLUCOSE 114* 121*  BUN 65* 58*  CREATININE 2.56* 2.08*  CALCIUM 8.9 8.8*    PT/INR: No results for input(s): "LABPROT", "INR" in the last 72 hours. ABG    Component Value Date/Time   PHART 7.357 03/12/2022 1724   HCO3 19.4 (L) 03/12/2022 1724   TCO2 20 (L) 03/12/2022 1724   ACIDBASEDEF 6.0 (H) 03/12/2022 1724   O2SAT 60.5 03/24/2022 0504   CBG (last 3)  Recent Labs    03/23/22 2303 03/24/22 0340 03/24/22 0738  GLUCAP 119* 124* 149*    Assessment/Plan: S/P Procedure(s) (LRB): CORONARY ARTERY BYPASS GRAFTING (CABG)  X TWO BYPASSES USING LEFT INTERNAL MAMMARY ARTERY AND RIGHT GREATER SAPHENOUS  VEIN HARVEST. (N/A) TRANSESOPHAGEAL ECHOCARDIOGRAM (TEE) (N/A)  POD 12  Hemodynamics stable on dobut 3. Co-ox 60.5. AHF deciding on further weaning.  AKI: creast improved to 2.08. Wt is down at preop.  Leukocytosis improving. Culture of synovial fluid negative. Remains on Maxepime.  Continue IS, ambulation.     LOS: 14 days    Gaye Pollack 03/24/2022

## 2022-03-24 NOTE — Plan of Care (Signed)
Patient remains in CVICU at this time. CVC in place. Dobutamine per order. Supplemental O2 at 1 L/min via [place].   Problem: Education: Goal: Knowledge of General Education information will improve Description: Including pain rating scale, medication(s)/side effects and non-pharmacologic comfort measures Outcome: Progressing   Problem: Health Behavior/Discharge Planning: Goal: Ability to manage health-related needs will improve Outcome: Progressing   Problem: Clinical Measurements: Goal: Ability to maintain clinical measurements within normal limits will improve Outcome: Progressing Goal: Will remain free from infection Outcome: Progressing Goal: Diagnostic test results will improve Outcome: Progressing Goal: Respiratory complications will improve Outcome: Progressing Goal: Cardiovascular complication will be avoided Outcome: Progressing   Problem: Activity: Goal: Risk for activity intolerance will decrease Outcome: Progressing   Problem: Nutrition: Goal: Adequate nutrition will be maintained Outcome: Progressing   Problem: Coping: Goal: Level of anxiety will decrease Outcome: Progressing   Problem: Elimination: Goal: Will not experience complications related to bowel motility Outcome: Progressing Goal: Will not experience complications related to urinary retention Outcome: Progressing   Problem: Pain Managment: Goal: General experience of comfort will improve Outcome: Progressing   Problem: Safety: Goal: Ability to remain free from injury will improve Outcome: Progressing   Problem: Skin Integrity: Goal: Risk for impaired skin integrity will decrease Outcome: Progressing   Problem: Education: Goal: Understanding of CV disease, CV risk reduction, and recovery process will improve Outcome: Progressing Goal: Individualized Educational Video(s) Outcome: Progressing   Problem: Activity: Goal: Ability to return to baseline activity level will improve Outcome:  Progressing   Problem: Cardiovascular: Goal: Ability to achieve and maintain adequate cardiovascular perfusion will improve Outcome: Progressing Goal: Vascular access site(s) Level 0-1 will be maintained Outcome: Progressing   Problem: Health Behavior/Discharge Planning: Goal: Ability to safely manage health-related needs after discharge will improve Outcome: Progressing   Problem: Cardiac: Goal: Ability to achieve and maintain adequate cardiopulmonary perfusion will improve Outcome: Progressing Goal: Vascular access site(s) Level 0-1 will be maintained Outcome: Progressing   Problem: Fluid Volume: Goal: Ability to achieve a balanced intake and output will improve Outcome: Progressing   Problem: Physical Regulation: Goal: Complications related to the disease process, condition or treatment will be avoided or minimized Outcome: Progressing   Problem: Respiratory: Goal: Will regain and/or maintain adequate ventilation Outcome: Progressing   Problem: Education: Goal: Will demonstrate proper wound care and an understanding of methods to prevent future damage Outcome: Progressing Goal: Knowledge of disease or condition will improve Outcome: Progressing Goal: Knowledge of the prescribed therapeutic regimen will improve Outcome: Progressing Goal: Individualized Educational Video(s) Outcome: Progressing   Problem: Activity: Goal: Risk for activity intolerance will decrease Outcome: Progressing   Problem: Cardiac: Goal: Will achieve and/or maintain hemodynamic stability Outcome: Progressing   Problem: Clinical Measurements: Goal: Postoperative complications will be avoided or minimized Outcome: Progressing   Problem: Respiratory: Goal: Respiratory status will improve Outcome: Progressing   Problem: Skin Integrity: Goal: Wound healing without signs and symptoms of infection Outcome: Progressing Goal: Risk for impaired skin integrity will decrease Outcome:  Progressing   Problem: Urinary Elimination: Goal: Ability to achieve and maintain adequate renal perfusion and functioning will improve Outcome: Progressing

## 2022-03-25 ENCOUNTER — Telehealth (HOSPITAL_COMMUNITY): Payer: Self-pay | Admitting: Pharmacy Technician

## 2022-03-25 ENCOUNTER — Inpatient Hospital Stay (HOSPITAL_COMMUNITY): Payer: Medicare Other

## 2022-03-25 ENCOUNTER — Other Ambulatory Visit (HOSPITAL_COMMUNITY): Payer: Self-pay

## 2022-03-25 DIAGNOSIS — I5021 Acute systolic (congestive) heart failure: Secondary | ICD-10-CM

## 2022-03-25 DIAGNOSIS — N179 Acute kidney failure, unspecified: Secondary | ICD-10-CM | POA: Diagnosis not present

## 2022-03-25 DIAGNOSIS — Z951 Presence of aortocoronary bypass graft: Secondary | ICD-10-CM

## 2022-03-25 DIAGNOSIS — R570 Cardiogenic shock: Secondary | ICD-10-CM | POA: Diagnosis not present

## 2022-03-25 DIAGNOSIS — I2102 ST elevation (STEMI) myocardial infarction involving left anterior descending coronary artery: Secondary | ICD-10-CM | POA: Diagnosis not present

## 2022-03-25 LAB — GLUCOSE, CAPILLARY
Glucose-Capillary: 118 mg/dL — ABNORMAL HIGH (ref 70–99)
Glucose-Capillary: 122 mg/dL — ABNORMAL HIGH (ref 70–99)
Glucose-Capillary: 124 mg/dL — ABNORMAL HIGH (ref 70–99)
Glucose-Capillary: 128 mg/dL — ABNORMAL HIGH (ref 70–99)
Glucose-Capillary: 135 mg/dL — ABNORMAL HIGH (ref 70–99)
Glucose-Capillary: 150 mg/dL — ABNORMAL HIGH (ref 70–99)
Glucose-Capillary: 94 mg/dL (ref 70–99)

## 2022-03-25 LAB — ECHOCARDIOGRAM LIMITED
Area-P 1/2: 5.46 cm2
Height: 67 in
Weight: 2977.09 oz

## 2022-03-25 LAB — CBC
HCT: 34.2 % — ABNORMAL LOW (ref 39.0–52.0)
Hemoglobin: 10.9 g/dL — ABNORMAL LOW (ref 13.0–17.0)
MCH: 29.5 pg (ref 26.0–34.0)
MCHC: 31.9 g/dL (ref 30.0–36.0)
MCV: 92.7 fL (ref 80.0–100.0)
Platelets: 535 10*3/uL — ABNORMAL HIGH (ref 150–400)
RBC: 3.69 MIL/uL — ABNORMAL LOW (ref 4.22–5.81)
RDW: 15.5 % (ref 11.5–15.5)
WBC: 19.3 10*3/uL — ABNORMAL HIGH (ref 4.0–10.5)
nRBC: 0.4 % — ABNORMAL HIGH (ref 0.0–0.2)

## 2022-03-25 LAB — COOXEMETRY PANEL
Carboxyhemoglobin: 0.9 % (ref 0.5–1.5)
Carboxyhemoglobin: 1.6 % — ABNORMAL HIGH (ref 0.5–1.5)
Carboxyhemoglobin: 2.1 % — ABNORMAL HIGH (ref 0.5–1.5)
Methemoglobin: <0.7 % (ref 0.0–1.5)
Methemoglobin: <0.7 % (ref 0.0–1.5)
Methemoglobin: <0.7 % (ref 0.0–1.5)
O2 Saturation: 55.2 %
O2 Saturation: 58.9 %
O2 Saturation: 56.7 %
Total hemoglobin: 10.6 g/dL — ABNORMAL LOW (ref 12.0–16.0)
Total hemoglobin: 11.6 g/dL — ABNORMAL LOW (ref 12.0–16.0)
Total hemoglobin: 11.8 g/dL — ABNORMAL LOW (ref 12.0–16.0)

## 2022-03-25 LAB — BODY FLUID CULTURE W GRAM STAIN: Culture: NO GROWTH

## 2022-03-25 LAB — BASIC METABOLIC PANEL
Anion gap: 8 (ref 5–15)
BUN: 58 mg/dL — ABNORMAL HIGH (ref 8–23)
CO2: 27 mmol/L (ref 22–32)
Calcium: 9 mg/dL (ref 8.9–10.3)
Chloride: 103 mmol/L (ref 98–111)
Creatinine, Ser: 1.83 mg/dL — ABNORMAL HIGH (ref 0.61–1.24)
GFR, Estimated: 40 mL/min — ABNORMAL LOW (ref >60)
Glucose, Bld: 97 mg/dL (ref 70–99)
Potassium: 4.1 mmol/L (ref 3.5–5.1)
Sodium: 138 mmol/L (ref 135–145)

## 2022-03-25 LAB — MAGNESIUM: Magnesium: 2.1 mg/dL (ref 1.7–2.4)

## 2022-03-25 MED ORDER — SODIUM CHLORIDE 0.9 % IV SOLN
2.0000 g | Freq: Two times a day (BID) | INTRAVENOUS | Status: AC
  Administered 2022-03-25 – 2022-03-27 (×6): 2 g via INTRAVENOUS
  Filled 2022-03-25 (×6): qty 12.5

## 2022-03-25 MED ORDER — INSULIN ASPART 100 UNIT/ML IJ SOLN
0.0000 [IU] | Freq: Three times a day (TID) | INTRAMUSCULAR | Status: DC
  Administered 2022-03-25 – 2022-03-27 (×3): 1 [IU] via SUBCUTANEOUS

## 2022-03-25 MED ORDER — INSULIN ASPART 100 UNIT/ML IJ SOLN: 0.0000 [IU] | Freq: Every day | INTRAMUSCULAR | Status: DC

## 2022-03-25 MED ORDER — PERFLUTREN LIPID MICROSPHERE
1.0000 mL | INTRAVENOUS | Status: AC | PRN
  Administered 2022-03-25: 2 mL via INTRAVENOUS

## 2022-03-25 NOTE — Progress Notes (Signed)
NAME:  Juan Snow, MRN:  [id number], DOB:  April 29, 1955, LOS: 40 ADMISSION DATE:  03/10/2022, CONSULTATION DATE:  03/12/22 REFERRING MD:  Kipp Brood, CHIEF COMPLAINT:  CABG   History of Present Illness:  67 year old man presented with chest pain on 03/10/22 found to have STEMI and severe 2V CAD, IABP placed with improvement in anginal symptoms and he was seen by cardiothoracic surgery.  Underwent CABG x 2 today (LIMA-LAD, SVG-ramus) with intra-op course complicated by difficulty weaning off bypass due to high PA pressures and low LV function; with iNO and high dose inotropes+pressors, able to be eventually weaned off pump.  Brought to ICU on pressors, inotropes, iNO, IABP, vent.  PCCM consulted to assist with management.   Pertinent  Medical History  GERD Prior VTE on [facility] PTA Arthtritis  Significant Hospital Events: Including procedures, antibiotic start and stop dates in addition to other pertinent events   1/7 admit 1/9 CABG x 2, saphenous graft harvest 1/10 weaning inotropic and nitric oxide support 1/11 extubated  11/13 intracardiac balloon pump removed. 1/15 worsening leukocytosis. Vanc and cefepime started. Cultures sent  1/16 multiple electrolyte issues, requiring aggressive potassium replacement.  Still on Lasix drip.  Creatinine stable.  Procalcitonin remains elevated.  Ultrasound of left chest showing small/moderate left pleural effusion with plan to proceed with thoracentesis and remove left triple-lumen catheter for line holiday.  Vancomycin stopped, preliminary blood cultures with Pseudomonas.  Left thoracentesis exudative by analysis, 200 mL removed 1/17 changing to oral diuresis, having gas discomfort but otherwise okay  Interim History / Subjective:  No overnight issues Coox 58% Serum creatinine continue to improve  Objective   Blood pressure 104/81, pulse 73, temperature 97.6 F (36.4 C), resp. rate (!) 26, height '5\' 7"'$  (1.702 m), weight 84.4 kg, SpO2 93 %. CVP:  [6  mmHg-13 mmHg] 6 mmHg      Intake/Output Summary (Last 24 hours) at 03/25/2022 1553 Last data filed at 03/25/2022 1200 Gross per 24 hour  Intake 238.82 ml  Output 1550 ml  Net -1311.18 ml   Filed Weights   03/22/22 0500 03/24/22 0500 03/25/22 0600  Weight: 87.6 kg 85.3 kg 84.4 kg    Examination: Physical exam: General: Elderly male, lying on the bed HEENT: [place]/AT, eyes anicteric.  moist mucus membranes Neuro: Alert, awake following commands Chest: Coarse breath sounds, no wheezes or rhonchi Heart: Regular rate and rhythm, no murmurs or gallops Abdomen: Soft, nontender, nondistended, bowel sounds present Skin: No rash   Labs: Coox 58% Sodium 138 Potassium 4.1 BUN 58 Serum creatinine 1.8 WBC 19.3 H/H10.9/34.2 Platelet 535  Resolved issues   Abdominal discomfort likely due to gaseous distention, resolved Hypokalemia/hypomagnesemia Acute hypoxic respiratory failure in the setting of pulmonary edema and pleural effusion Exudative left-sided pleural effusion s/p thoracentesis Sepsis  Assessment & Plan:  Acute anterior STEMI s/p CABG x 2 Acute HFrEF with cardiogenic shock Continue aspirin and statin CVP is 5 Continue to hold diuretics, Continue to titrate dobutamine, currently on 1 mic Plan to repeat Coox later and discontinue dobutamine Continue amiodarone Continue to hold digoxin Monitor intake and output Continue TED stockings  Pansensitive Pseudomonas bacteremia, likely urinary source White count continues to trend down Remained afebrile Continue cefepime until 1/31 per discussion with ID, if patient goes home before this date, he will be discharge with p.o. ciprofloxacin  AKI on CKD stage IIIa Serum creatinine continue to improve, currently at 1.8 Holding diuretics Monitor intake and output Avoid nephrotoxic agents  Hyponatremia, hypervolemic, corrected Serum  sodium is corrected, currently at 138 Monitor electrolytes  GERD Continue  Protonix  Submassive PE on anticoagulation Continue Eliquis  Prediabetes Blood sugars are controlled  Continue insulin with CBG goal 140-180  Best Practice (right click and "Reselect all SmartList Selections" daily)   Diet/type: Regular DVT prophylaxis: DOAC GI prophylaxis: PPI Lines: N/A Foley: N/A Code Status:  full code Last date of multidisciplinary goals of care discussion [per primary]    Jacky Kindle, MD [place] [facility] See Amion for pager If no response to pager, please call [phone] until 7pm After 7pm, Please call E-link [phone]

## 2022-03-25 NOTE — Progress Notes (Signed)
    Afternoon CO-OX 59% on DBA 1 mcg.   Stop DBA.   Ok to transfer to [place] NP-C  3:45 PM

## 2022-03-25 NOTE — Progress Notes (Signed)
Id brief note   Patient right first mtp joint aspiration cx remains negative   A/p Pseudomonas bsi unclear source S/p cabg Chf Aki Hx PE Hx covid 19    -plan 2 weeks total abx for unclear source pseudomonas bacteremia, starting at 1/18 after all lines removed until 04/04/2022 -continue cefepime renal dosing here; if discharge before 04/04/2022 can transition to ciprofloxacin appropriate renal dosing for this to finish course -discuss with primary team

## 2022-03-25 NOTE — Telephone Encounter (Signed)
Patient Advocate Encounter  Prior Authorization for Eliquis 5 mg has been approved.    PA# [id number] Effective dates: 03/25/2022 through 03/04/2023  Patients co-pay is $504.06 due to a $410.00 deductible.     Lyndel Safe, [place] Patient Advocate Specialist [place] Patient Advocate Team Direct Number: [phone]  Fax: [phone]

## 2022-03-25 NOTE — TOC Benefit Eligibility Note (Signed)
Patient Teacher, English as a foreign language completed.    The patient is currently admitted and upon discharge could be taking Eliquis 5 mg.  Prior Authorization Requied  The patient is insured through [place], [place] Patient Advocate Specialist [place] Patient Advocate Team Direct Number: [phone]  Fax: [phone]

## 2022-03-25 NOTE — Progress Notes (Signed)
  Echocardiogram 2D Echocardiogram has been performed.  Juan Snow 03/25/2022, 2:31 PM

## 2022-03-25 NOTE — Progress Notes (Signed)
Physical Therapy Treatment Patient Details Name: Juan Snow MRN: [id number] DOB: 06/08/1955 Today's Date: 03/25/2022   History of Present Illness Juan Snow is an 67 y.o. male who presented to [facility] [address] on 03/10/2022 with a chief complaint of CP/STEMI. 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 1/13 balloon pump removed. 1/16 Ultrasound of left chest showing small/moderate left pleural effusion & TEE. PMHx: arthritis, OA, GERD    PT Comments    Pt progressing steadily towards his physical therapy goals. Able to participate in warm up exercises and ambulating 370 ft with a walker at a min guard assist level. Overall good adherence to sternal precautions. Will continue to follow acutely.   Recommendations for follow up therapy are one component of a multi-disciplinary discharge planning process, led by the attending physician.  Recommendations may be updated based on patient status, additional functional criteria and insurance authorization.  Follow Up Recommendations  Other (comment) (OP Cardiac Rehab)     Assistance Recommended at Discharge PRN  Patient can return home with the following Assistance with cooking/housework;Assist for transportation;Help with stairs or ramp for entrance   Equipment Recommendations  Rollator (4 wheels)    Recommendations for Other Services       Precautions / Restrictions Precautions Precautions: Fall;Sternal Precaution Booklet Issued: Yes (comment) Restrictions Weight Bearing Restrictions: Yes (sternal precautions) RUE Weight Bearing: Non weight bearing LUE Weight Bearing: Non weight bearing     Mobility  Bed Mobility Overal bed mobility: Modified Independent                  Transfers Overall transfer level: Needs assistance Equipment used: Rolling walker (2 wheels) Transfers: Sit to/from Stand Sit to Stand: Supervision                 Ambulation/Gait Ambulation/Gait assistance: Min guard Gait Distance (Feet): 370 Feet Assistive device: Rolling walker (2 wheels) Gait Pattern/deviations: Step-through pattern, Decreased stride length Gait velocity: decreased     General Gait Details: Slow and steady pace, tendency for downward gaze, min guard for safety   Stairs             Wheelchair Mobility    Modified Rankin (Stroke Patients Only)       Balance Overall balance assessment: Needs assistance Sitting-balance support: Feet supported Sitting balance-Leahy Scale: Good     Standing balance support: Bilateral upper extremity supported, During functional activity, No upper extremity supported Standing balance-Leahy Scale: Fair                              Cognition Arousal/Alertness: Awake/alert Behavior During Therapy: WFL for tasks assessed/performed Overall Cognitive Status: Within Functional Limits for tasks assessed                                          Exercises General Exercises - Lower Extremity Long Arc Quad: Both, 10 reps, Seated Heel Slides: 10 reps, Both, Supine Hip Flexion/Marching: Both, 10 reps, Seated    General Comments  VSS on 1L O2      Pertinent Vitals/Pain Pain Assessment Pain Assessment: No/denies pain    Home Living                          Prior Function  PT Goals (current goals can now be found in the care plan section) Acute Rehab PT Goals Patient Stated Goal: get stronger, more stable, walk more Potential to Achieve Goals: Good Progress towards PT goals: Progressing toward goals    Frequency    Min 3X/week      PT Plan Current plan remains appropriate    Co-evaluation              AM-PAC PT "6 Clicks" Mobility   Outcome Measure  Help needed turning from your back to your side while in a flat bed without using bedrails?: None Help needed moving from lying on your back to sitting on  the side of a flat bed without using bedrails?: None Help needed moving to and from a bed to a chair (including a wheelchair)?: A Little Help needed standing up from a chair using your arms (e.g., wheelchair or bedside chair)?: A Little Help needed to walk in hospital room?: A Little Help needed climbing 3-5 steps with a railing? : A Little 6 Click Score: 20    End of Session Equipment Utilized During Treatment: Oxygen Activity Tolerance: Patient tolerated treatment well Patient left: in bed;with call bell/phone within reach Nurse Communication: Mobility status PT Visit Diagnosis: Unsteadiness on feet (R26.81);Pain;Difficulty in walking, not elsewhere classified (R26.2);Other abnormalities of gait and mobility (R26.89)     Time: 1115-5208 PT Time Calculation (min) (ACUTE ONLY): 25 min  Charges:  $Therapeutic Activity: 23-37 mins                     Juan Snow, PT, DPT Acute Rehabilitation Services Office [phone]    Juan Snow 03/25/2022, 4:57 PM

## 2022-03-25 NOTE — Progress Notes (Addendum)
Patient ID: Juan Snow, male   DOB: 1955-07-18, 67 y.o.   MRN: [id number]   Advanced Heart Failure Rounding Note  PCP-Cardiologist: None   Subjective:    S/p CABG x 2 on 03/12/22. Pre-op EF 25-30%. Prolonged post-op course due to HF, AKI and pseudomonas bacteremia   Currently on DBA 1. CO-OX 55%. SCr 3.1 -> 2.5 -> 2.1 -->1.8   CVP 5  Feels ok. Foot pain resolved.     Objective:   Weight Range: 84.4 kg Body mass index is 29.14 kg/m.   Vital Signs:   Temp:  [97.8 F (36.6 C)-98.6 F (37 C)] 97.8 F (36.6 C) (01/22 0400) Pulse Rate:  [70-79] 75 (01/22 0700) Resp:  [17-33] 33 (01/22 0700) BP: (83-120)/(70-93) 115/84 (01/22 0700) SpO2:  [85 %-95 %] 89 % (01/22 0700) Weight:  [84.4 kg] 84.4 kg (01/22 0600) Last BM Date : 03/22/22  Weight change: Filed Weights   03/22/22 0500 03/24/22 0500 03/25/22 0600  Weight: 87.6 kg 85.3 kg 84.4 kg    Intake/Output:   Intake/Output Summary (Last 24 hours) at 03/25/2022 0806 Last data filed at 03/25/2022 0600 Gross per 24 hour  Intake 149.87 ml  Output 1625 ml  Net -1475.13 ml    CVP 5  Physical Exam  General:  In the chair.  No resp difficulty HEENT: normal Neck: supple. no JVD. Carotids 2+ bilat; no bruits. No lymphadenopathy or thryomegaly appreciated. Cor: PMI nondisplaced. Regular rate & rhythm. No rubs, gallops or murmurs. Lungs: clear Abdomen: soft, nontender, nondistended. No hepatosplenomegaly. No bruits or masses. Good bowel sounds. Extremities: no cyanosis, clubbing, rash, trace lower extremity edema. edema. RUE PICC  Neuro: alert & orientedx3, cranial nerves grossly intact. moves all 4 extremities w/o difficulty. Affect pleasant  Telemetry   SR 70s  Labs    CBC Recent Labs    03/24/22 0340 03/25/22 0500  WBC 21.9* 19.3*  HGB 10.5* 10.9*  HCT 30.9* 34.2*  MCV 90.6 92.7  PLT 452* 295*   Basic Metabolic Panel Recent Labs    03/24/22 0340 03/25/22 0500  NA 133* 138  K 4.0 4.1  CL 99 103  CO2  23 27  GLUCOSE 121* 97  BUN 58* 58*  CREATININE 2.08* 1.83*  CALCIUM 8.8* 9.0  MG  --  2.1   Liver Function Tests No results for input(s): "AST", "ALT", "ALKPHOS", "BILITOT", "PROT", "ALBUMIN" in the last 72 hours.   No results for input(s): "LIPASE", "AMYLASE" in the last 72 hours. Cardiac Enzymes No results for input(s): "CKTOTAL", "CKMB", "CKMBINDEX", "TROPONINI" in the last 72 hours.  BNP: BNP (last 3 results) Recent Labs    05/11/21 1052  BNP 23.1    ProBNP (last 3 results) No results for input(s): "PROBNP" in the last 8760 hours.   D-Dimer No results for input(s): "DDIMER" in the last 72 hours. Hemoglobin A1C No results for input(s): "HGBA1C" in the last 72 hours.  Fasting Lipid Panel No results for input(s): "CHOL", "HDL", "LDLCALC", "TRIG", "CHOLHDL", "LDLDIRECT" in the last 72 hours.  Thyroid Function Tests No results for input(s): "TSH", "T4TOTAL", "T3FREE", "THYROIDAB" in the last 72 hours.  Invalid input(s): "FREET3"  Other results:   Imaging    No results found.   Medications:     Scheduled Medications:  amiodarone  200 mg Oral Daily   apixaban  5 mg Oral BID   aspirin EC  81 mg Oral Daily   atorvastatin  80 mg Oral Daily   Chlorhexidine Gluconate Cloth  6 each Topical Daily   docusate sodium  200 mg Oral Daily   famotidine  20 mg Oral Daily   insulin aspart  3-9 Units Subcutaneous Q4H   insulin detemir  12 Units Subcutaneous Q12H   lidocaine  2 patch Transdermal Daily   sodium chloride flush  10-40 mL Intracatheter Q12H   sodium chloride flush  3 mL Intravenous Q12H    Infusions:  ceFEPime (MAXIPIME) IV     DOBUTamine 1 mcg/kg/min (03/25/22 0600)    PRN Medications: acetaminophen, [START ON 03/26/2022] influenza vaccine adjuvanted, morphine injection, mouth rinse, oxyCODONE, [START ON 03/26/2022] pneumococcal 20-valent conjugate vaccine, prochlorperazine, simethicone, sodium chloride, sodium chloride flush, sodium chloride flush,  traMADol    Patient Profile   67 y/o male w/ h/o submassive PE in March 2023 w/ subsequent RV dysfunction that recovered post treatment with catheter directed thrombolysis, on Xarelto, admitted w/ large acute anterolateral STEMI c/b acute systolic heart failure/shock requiring placement of IABP. Now s/p CABG. AHF team consulted to assist w/ management of HF. Also COVID +   Assessment/Plan   1. Cardiogenic shock/ischemic cardiomyopathy: Echo pre-op with LV EF 25-30%, wall motion abnormalities, normal RV.  IABP placed pre-op for management of angina.  Intra-op, patient developed acute worsening of LV and RV function as well as acute rise in PA pressure (PASBP 80s-90s).  Difficult to wean off bypass, but eventually successful wean with iNO, inotropes, pressors, IABP.  After prolonged course, pressors weaned off and IABP removed.   - Currently on DBA 1. CO-OX 55%. Possible d/c this afternoon.  -CVP 5. Volume status stable. Hold diuretics.  - Off digoxin with AKI - Unable to use GDMT yet with AKI and need for inotropes. Add as tolerated - Check limited Echo   2. CAD: Anterolateral MI with cath showing 80% ostial LAD stenosis and 80% ostial large ramus stenosis.  Ostial lesions in close proximity made PCI very difficult and CABG was elected.  Patient underwent LIMA-LAD and SVG-ramus on 03/12/22 - No chest pain.  - Continue ASA/statin. No Plavix with need for AC - No b-blocker with shock - Eventual referral to CR  3. H/o PE: Submassive PE in 3/23, treated with catheter-directed thrombolysis.   - On Xarelto PTA. Eliquis started 03/23/22  4. COVID-19+: Incidental finding at admission.  - resolved  5. Acute hypoxemic respiratory failure:  - Remains on 2 liters [place]>   6. Thrombocytopenia: Platelets as low as 57K now improved suspect post-op though IABP may have played role.  - Resolved.   7. Atrial ectopy: PACs and SVT runs, have not seen definite atrial fibrillation.  - SR today.  - stop amio  once off DBA. Will drop to 200 daily today  8. Pseudomonas bacteremia -Was on meropenem -ID following and switched back to cefepime.  -Surveillance cx drawn 1/19 NGTD - Afebrile.  - WBC 27.7 -> 25.8 -> 21.9-> 19 -Continue IS   9. Hyponatremia, hypervolemic:  -Resolved.   10. Acute gout R 1st MTP - Uric acid 16. Resolved with prednisone.     Haileigh Pitz NP-C   03/25/2022 8:06 AM

## 2022-03-25 NOTE — Progress Notes (Signed)
PHARMACY NOTE:  ANTIMICROBIAL RENAL DOSAGE ADJUSTMENT  Current antimicrobial regimen includes a mismatch between antimicrobial dosage and estimated renal function.  As per policy approved by the Pharmacy & Therapeutics and Medical Executive Committees, the antimicrobial dosage will be adjusted accordingly.  Current antimicrobial dosage:  Cefepime 2g IV Q24H   Indication: Pseudomonas aeruginosa bacteremia   Renal Function:  Estimated Creatinine Clearance: 41.8 mL/min (A) (by C-G formula based on SCr of 1.83 mg/dL (H)). '[]'$      On intermittent HD, scheduled: '[]'$      On CRRT    Antimicrobial dosage has been changed to:  Cefepime 2g IV Q12H   Additional comments:   Thank you for allowing pharmacy to be a part of this patient's care.  Adria Dill, PharmD PGY-2 Infectious Diseases Resident  03/25/2022 7:22 AM

## 2022-03-25 NOTE — Progress Notes (Signed)
[address]       [place] [address]             [phone]                 13 Days Post-Op Procedure(s) (LRB): CORONARY ARTERY BYPASS GRAFTING (CABG) X TWO BYPASSES USING LEFT INTERNAL MAMMARY ARTERY AND RIGHT GREATER SAPHENOUS  VEIN HARVEST. (N/A) TRANSESOPHAGEAL ECHOCARDIOGRAM (TEE) (N/A)   Events: No events ambulated _______________________________________________________________ Vitals: BP 115/84   Pulse 75   Temp 97.8 F (36.6 C) (Oral)   Resp (!) 33   Ht '5\' 7"'$  (1.702 m)   Wt 84.4 kg   SpO2 (!) 89%   BMI 29.14 kg/m  Filed Weights   03/22/22 0500 03/24/22 0500 03/25/22 0600  Weight: 87.6 kg 85.3 kg 84.4 kg     - Neuro: alert NAD  - Cardiovascular: sinus  Drips: dob 1.   CVP:  [8 mmHg-13 mmHg] 13 mmHg  - Pulm: EWOB    ABG    Component Value Date/Time   PHART 7.357 03/12/2022 1724   PCO2ART 34.4 03/12/2022 1724   PO2ART 164 (H) 03/12/2022 1724   HCO3 19.4 (L) 03/12/2022 1724   TCO2 20 (L) 03/12/2022 1724   ACIDBASEDEF 6.0 (H) 03/12/2022 1724   O2SAT 55.2 03/25/2022 0441    - Abd: ND - Extremity: warm, trace edema  .Intake/Output      01/21 0701 01/22 0700 01/22 0701 01/23 0700   P.O.     I.V. (mL/kg) 49.9 (0.6)    IV Piggyback 100    Total Intake(mL/kg) 149.9 (1.8)    Urine (mL/kg/hr) 1725 (0.9)    Total Output 1725    Net -1575.1            _______________________________________________________________ Labs:    Latest Ref Rng & Units 03/25/2022    5:00 AM 03/24/2022    3:40 AM 03/23/2022    5:00 AM  CBC  WBC 4.0 - 10.5 K/uL 19.3  21.9  25.8   Hemoglobin 13.0 - 17.0 g/dL 10.9  10.5  10.1   Hematocrit 39.0 - 52.0 % 34.2  30.9  30.0   Platelets 150 - 400 K/uL 535  452  442       Latest Ref Rng & Units 03/25/2022    5:00 AM 03/24/2022    3:40 AM 03/23/2022    5:00 AM  CMP  Glucose 70 - 99 mg/dL 97  121  114   BUN 8 - 23 mg/dL 58  58  65   Creatinine 0.61 - 1.24 mg/dL 1.83  2.08  2.56   Sodium 135 - 145  mmol/L 138  133  135   Potassium 3.5 - 5.1 mmol/L 4.1  4.0  3.6   Chloride 98 - 111 mmol/L 103  99  96   CO2 22 - 32 mmol/L '27  23  27   '$ Calcium 8.9 - 10.3 mg/dL 9.0  8.8  8.9     CXR: -  _______________________________________________________________  Assessment and Plan: POD 13 s/p CABG  Neuro: pain controlled.   CV: will wean Dob.  Will titrated heart failure meds Pulm: IS, ambulation Renal: creat trending down GI: on diet Heme: stable, on eliquis for hx of PEs ID: afebrile Endo: SSI Dispo: floor today.  Dispo planning.   Lajuana Matte 03/25/2022 7:57 AM

## 2022-03-25 NOTE — Telephone Encounter (Signed)
Patient Advocate Encounter   Received notification that prior authorization for Eliquis '5MG'$  tablets is required.   PA submitted on 03/25/2022 Key [id number] Status is pending       Juan Snow, [place] Patient Advocate Specialist [place] Patient Advocate Team Direct Number: [phone]  Fax: [phone]

## 2022-03-26 DIAGNOSIS — Z951 Presence of aortocoronary bypass graft: Secondary | ICD-10-CM

## 2022-03-26 DIAGNOSIS — I255 Ischemic cardiomyopathy: Secondary | ICD-10-CM

## 2022-03-26 LAB — COOXEMETRY PANEL
Carboxyhemoglobin: 2 % — ABNORMAL HIGH (ref 0.5–1.5)
Methemoglobin: 1.1 % (ref 0.0–1.5)
O2 Saturation: 63.5 %
Total hemoglobin: 11.5 g/dL — ABNORMAL LOW (ref 12.0–16.0)

## 2022-03-26 LAB — GLUCOSE, CAPILLARY
Glucose-Capillary: 87 mg/dL (ref 70–99)
Glucose-Capillary: 78 mg/dL (ref 70–99)
Glucose-Capillary: 79 mg/dL (ref 70–99)
Glucose-Capillary: 121 mg/dL — ABNORMAL HIGH (ref 70–99)

## 2022-03-26 LAB — BASIC METABOLIC PANEL
Anion gap: 9 (ref 5–15)
BUN: 49 mg/dL — ABNORMAL HIGH (ref 8–23)
CO2: 23 mmol/L (ref 22–32)
Calcium: 8.8 mg/dL — ABNORMAL LOW (ref 8.9–10.3)
Chloride: 108 mmol/L (ref 98–111)
Creatinine, Ser: 1.61 mg/dL — ABNORMAL HIGH (ref 0.61–1.24)
GFR, Estimated: 47 mL/min — ABNORMAL LOW (ref >60)
Glucose, Bld: 102 mg/dL — ABNORMAL HIGH (ref 70–99)
Potassium: 4.6 mmol/L (ref 3.5–5.1)
Sodium: 140 mmol/L (ref 135–145)

## 2022-03-26 NOTE — Progress Notes (Addendum)
[address]       [place] [address]             [phone]      14 Days Post-Op Procedure(s) (LRB): CORONARY ARTERY BYPASS GRAFTING (CABG) X TWO BYPASSES USING LEFT INTERNAL MAMMARY ARTERY AND RIGHT GREATER SAPHENOUS  VEIN HARVEST. (N/A) TRANSESOPHAGEAL ECHOCARDIOGRAM (TEE) (N/A) Subjective: Patient has no new complaints. +BM yesterday  Objective: Vital signs in last 24 hours: Temp:  [97.5 F (36.4 C)-98.3 F (36.8 C)] 97.7 F (36.5 C) (01/23 0427) Pulse Rate:  [69-80] 76 (01/23 0427) Cardiac Rhythm: Normal sinus rhythm;Other (Comment) (01/22 2111) Resp:  [17-32] 22 (01/23 0427) BP: (85-118)/(41-85) 110/80 (01/23 0427) SpO2:  [73 %-99 %] 96 % (01/23 0427) Weight:  [83.6 kg] 83.6 kg (01/23 0600)  Hemodynamic parameters for last 24 hours: CVP:  [0 mmHg-17 mmHg] 17 mmHg  Intake/Output from previous day: 01/22 0701 - 01/23 0700 In: 476.1 [P.O.:240; I.V.:36.1; IV Piggyback:200] Out: 2050 [Urine:2050] Intake/Output this shift: No intake/output data recorded.  General appearance: alert, cooperative, and no distress Neurologic: intact Heart: regular rate and rhythm, S1, S2 normal, no murmur, click, rub or gallop Lungs: clear to auscultation bilaterally Abdomen: soft, non-tender; bowel sounds normal; no masses,  no organomegaly Extremities: edema trace Wound: Clean and dry, no sign of infection   Lab Results: Recent Labs    03/24/22 0340 03/25/22 0500  WBC 21.9* 19.3*  HGB 10.5* 10.9*  HCT 30.9* 34.2*  PLT 452* 535*   BMET:  Recent Labs    03/25/22 0500 03/26/22 0430  NA 138 140  K 4.1 4.6  CL 103 108  CO2 27 23  GLUCOSE 97 102*  BUN 58* 49*  CREATININE 1.83* 1.61*  CALCIUM 9.0 8.8*    PT/INR: No results for input(s): "LABPROT", "INR" in the last 72 hours. ABG    Component Value Date/Time   PHART 7.357 03/12/2022 1724   HCO3 19.4 (L) 03/12/2022 1724   TCO2 20 (L) 03/12/2022 1724   ACIDBASEDEF 6.0 (H) 03/12/2022 1724   O2SAT  56.7 03/25/2022 1834   CBG (last 3)  Recent Labs    03/25/22 2005 03/25/22 2115 03/26/22 0609  GLUCAP 128* 124* 87    Assessment/Plan: S/P Procedure(s) (LRB): CORONARY ARTERY BYPASS GRAFTING (CABG) X TWO BYPASSES USING LEFT INTERNAL MAMMARY ARTERY AND RIGHT GREATER SAPHENOUS  VEIN HARVEST. (N/A) TRANSESOPHAGEAL ECHOCARDIOGRAM (TEE) (N/A)  CV: NSR with run of PVCs. HR 70s. SBP 110. On Amiodarone '200mg'$  QD and Eliquis '5mg'$  BID. Heart failure team following, appreciate their assistance.   Pulm: Hx of submassive PE, on Eliquis '5mg'$  BID. Saturating 96% RA. Ambulated twice yesterday. Continue IS, flutter valve and ambulation.   GI: +BM yesterday. No abdominal discomfort or nausea.  Endo: Preop A1C 6.2, likely prediabetic. CBGs controlled. On SSI and Levemir. Will need outpatient follow up.   Renal: AKI on CKD stage IIIa, Cr down to 1.61. Good UO. -3lbs preop weight, holding diuretics.   ID: Pseudomonas bacteremia, ID following. On Cefepime until 04/04/22, can transition to ciprofloxacin with renal dosing if discharged before then per ID.  Deconditioning: PT/OT eval recommended outpatient cardiac rehab.  Dispo: Discharge planning, will discuss with Heart Failure team and optimize medication therapy.    LOS: 16 days    Magdalene River, PA-C 03/26/2022  Agree with above Doing well Titrating meds [place]

## 2022-03-26 NOTE — Progress Notes (Signed)
Physical Therapy Treatment Patient Details Name: Juan Snow MRN: [id number] DOB: December 29, 1955 Today's Date: 03/26/2022   History of Present Illness Juan Snow is an 67 y.o. male who presented to [facility] on 03/10/2022 with a chief complaint of CP/STEMI. 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 1/13 balloon pump removed. 1/16 Ultrasound of left chest showing small/moderate left pleural effusion & TEE. PMHx: arthritis, OA, GERD    PT Comments    Received pt semi-reclined in bed just finishing lunch and agreeable to PT treatment. Pt performed bed mobility mod I with HOB slightly elevated with good adherence to sternal precautions. Pt stood from EOB with RW and mod I pushing through legs but had arms resting on RW for balance. Pt ambulated 124f with RW and mod I through hallway - limited by mild SOB (despite SPO2 being 94%) and feeling too "full" from lunch. Returned to room and left sitting EOB with all needs within reach. Discussed progression with endurance at home and in cardiac rehab. Acute PT to cont to follow.     Recommendations for follow up therapy are one component of a multi-disciplinary discharge planning process, led by the attending physician.  Recommendations may be updated based on patient status, additional functional criteria and insurance authorization.  Follow Up Recommendations  Other (comment) (OP cardiac rehab)     Assistance Recommended at Discharge PRN  Patient can return home with the following Assistance with cooking/housework;Assist for transportation;Help with stairs or ramp for entrance   Equipment Recommendations  Rollator (4 wheels)    Recommendations for Other Services       Precautions / Restrictions Precautions Precautions: Fall;Sternal Precaution Comments: pt demonstrated good understanding/recall of sternal precautions Restrictions Weight Bearing Restrictions: No Other  Position/Activity Restrictions: sternal precautions     Mobility  Bed Mobility Overal bed mobility: Modified Independent Bed Mobility: Rolling, Sidelying to Sit Rolling: Modified independent (Device/Increase time) Sidelying to sit: Modified independent (Device/Increase time)       General bed mobility comments: Pt transferred supine<>sitting EOB with HOB slightly elevated. Pt quick to move but demonstrated good understanding of sternal precautions Patient Response: Cooperative  Transfers Overall transfer level: Modified independent Equipment used: Rolling walker (2 wheels) Transfers: Sit to/from Stand Sit to Stand: Modified independent (Device/Increase time)           General transfer comment: stood from EOB with RW and mod I. Placed hands on RW for balance but did not push through UEs and relied on strength of legs to rise into standing    Ambulation/Gait Ambulation/Gait assistance: Modified independent (Device/Increase time) Gait Distance (Feet): 160 Feet Assistive device: Rolling walker (2 wheels) Gait Pattern/deviations: Step-through pattern, Decreased stride length, Decreased step length - right, Decreased step length - left, Narrow base of support Gait velocity: decreased Gait velocity interpretation: <1.8 ft/sec, indicate of risk for recurrent falls   General Gait Details: slow and steady pace; limited due to feeling "full" from eating lunch. Took 1 standing rest break due to mild SOB - SPO2 94% on RA   Stairs             Wheelchair Mobility    Modified Rankin (Stroke Patients Only)       Balance Overall balance assessment: Modified Independent Sitting-balance support: Feet supported Sitting balance-Leahy Scale: Good Sitting balance - Comments: left sitting EOB at end of session   Standing balance support: Bilateral upper extremity supported, During functional activity, No  upper extremity supported Standing balance-Leahy Scale: Good Standing  balance comment: able to maintain static and dynamic standing balance with BUE support on RW and mod I                            Cognition Arousal/Alertness: Awake/alert Behavior During Therapy: WFL for tasks assessed/performed Overall Cognitive Status: Within Functional Limits for tasks assessed                                 General Comments: pleasant and cooperative        Exercises      General Comments General comments (skin integrity, edema, etc.): SPO2 WFL throughout session      Pertinent Vitals/Pain Pain Assessment Pain Assessment: No/denies pain    Home Living                          Prior Function            PT Goals (current goals can now be found in the care plan section) Acute Rehab PT Goals Patient Stated Goal: get stronger, more stable, walk more PT Goal Formulation: With patient/family Time For Goal Achievement: 04/02/22 Potential to Achieve Goals: Good Progress towards PT goals: Progressing toward goals    Frequency    Min 3X/week      PT Plan Current plan remains appropriate    Co-evaluation              AM-PAC PT "6 Clicks" Mobility   Outcome Measure  Help needed turning from your back to your side while in a flat bed without using bedrails?: None Help needed moving from lying on your back to sitting on the side of a flat bed without using bedrails?: None Help needed moving to and from a bed to a chair (including a wheelchair)?: None Help needed standing up from a chair using your arms (e.g., wheelchair or bedside chair)?: None Help needed to walk in hospital room?: None Help needed climbing 3-5 steps with a railing? : A Little 6 Click Score: 23    End of Session   Activity Tolerance: Patient tolerated treatment well;Patient limited by fatigue Patient left: in bed;with call bell/phone within reach Nurse Communication: Mobility status PT Visit Diagnosis: Unsteadiness on feet  (R26.81);Difficulty in walking, not elsewhere classified (R26.2);Other abnormalities of gait and mobility (R26.89);Muscle weakness (generalized) (M62.81)     Time: 7494-4967 PT Time Calculation (min) (ACUTE ONLY): 11 min  Charges:  $Gait Training: 8-22 mins                     Becky Sax PT, DPT  Blenda Nicely 03/26/2022, 12:50 PM

## 2022-03-26 NOTE — Progress Notes (Signed)
Patient ID: Juan Snow, male   DOB: [birth year], 67 y.o.   MRN: [id number]   Advanced Heart Failure Rounding Note  PCP-Cardiologist: None   Subjective:    S/p CABG x 2 on 03/12/22. Pre-op EF 25-30%. Prolonged post-op course due to HF, AKI and pseudomonas bacteremia  DBA weaned off yesterday. No co-ox today. SCr 3.1 -> 2.5 -> 2.1 -->1.8 ->1.6  Limited echo yesterday EF 25-30%, GIIDD, trivial MR and TR  CVP 9/10 today. Diuretics held yesterday. Weight stable, down 2lbs.   Feels fine this morning. Getting ready to ambulate. Denies CP/SOB.   Objective:   Weight Range: 83.6 kg Body mass index is 28.87 kg/m.   Vital Signs:   Temp:  [97.5 F (36.4 C)-98.3 F (36.8 C)] 97.8 F (36.6 C) (01/23 0756) Pulse Rate:  [69-80] 71 (01/23 0756) Resp:  [17-32] 20 (01/23 0756) BP: (85-118)/(41-85) 99/72 (01/23 0756) SpO2:  [73 %-99 %] 95 % (01/23 0756) Weight:  [83.6 kg] 83.6 kg (01/23 0600) Last BM Date : 03/25/22  Weight change: Filed Weights   03/24/22 0500 03/25/22 0600 03/26/22 0600  Weight: 85.3 kg 84.4 kg 83.6 kg    Intake/Output:   Intake/Output Summary (Last 24 hours) at 03/26/2022 0944 Last data filed at 03/26/2022 0600 Gross per 24 hour  Intake 473.48 ml  Output 1650 ml  Net -1176.52 ml    CVP 9/10  Physical Exam  General:  well appearing.  No respiratory difficulty HEENT: normal Neck: supple. JVD ~9 cm. Carotids 2+ bilat; no bruits. No lymphadenopathy or thyromegaly appreciated. Cor: PMI nondisplaced. Regular rate & rhythm. No rubs, gallops or murmurs. Lungs: clear Abdomen: soft, nontender, nondistended. No hepatosplenomegaly. No bruits or masses. Good bowel sounds. Extremities: no cyanosis, clubbing, rash, non-pitting BLE edema.  PICC RUE Neuro: alert & oriented x 3, cranial nerves grossly intact. moves all 4 extremities w/o difficulty. Affect pleasant.   Telemetry   NSR 70s 4 beat NSVT x1 (Personally reviewed)    Labs    CBC Recent Labs    03/24/22 0340  03/25/22 0500  WBC 21.9* 19.3*  HGB 10.5* 10.9*  HCT 30.9* 34.2*  MCV 90.6 92.7  PLT 452* 299*   Basic Metabolic Panel Recent Labs    03/25/22 0500 03/26/22 0430  NA 138 140  K 4.1 4.6  CL 103 108  CO2 27 23  GLUCOSE 97 102*  BUN 58* 49*  CREATININE 1.83* 1.61*  CALCIUM 9.0 8.8*  MG 2.1  --    Liver Function Tests No results for input(s): "AST", "ALT", "ALKPHOS", "BILITOT", "PROT", "ALBUMIN" in the last 72 hours.   No results for input(s): "LIPASE", "AMYLASE" in the last 72 hours. Cardiac Enzymes No results for input(s): "CKTOTAL", "CKMB", "CKMBINDEX", "TROPONINI" in the last 72 hours.  BNP: BNP (last 3 results) Recent Labs    05/11/21 1052  BNP 23.1    ProBNP (last 3 results) No results for input(s): "PROBNP" in the last 8760 hours.   D-Dimer No results for input(s): "DDIMER" in the last 72 hours. Hemoglobin A1C No results for input(s): "HGBA1C" in the last 72 hours.  Fasting Lipid Panel No results for input(s): "CHOL", "HDL", "LDLCALC", "TRIG", "CHOLHDL", "LDLDIRECT" in the last 72 hours.  Thyroid Function Tests No results for input(s): "TSH", "T4TOTAL", "T3FREE", "THYROIDAB" in the last 72 hours.  Invalid input(s): "FREET3"  Other results:   Imaging    ECHOCARDIOGRAM LIMITED  Result Date: 03/25/2022    ECHOCARDIOGRAM LIMITED REPORT   Patient Name:  Worthy Keeler Date of Exam: 03/25/2022 Medical Rec #:  [id number]       Height:       67.0 in Accession #:    [id number]      Weight:       186.1 lb Date of Birth:  02-10-1956        BSA:          1.961 m Patient Age:    65 years        BP:           88/41 mmHg Patient Gender: M               HR:           72 bpm. Exam Location:  Inpatient Procedure: Limited Echo, Cardiac Doppler and Limited Color Doppler Indications:    Congestive Heart Failure I50.9  History:        Patient has prior history of Echocardiogram examinations, most                 recent 03/11/2022. CHF and Cardiogenic shock, Stemi and CAD,                  Bacteriema; Risk Factors:Non-Smoker.  Sonographer:    Greer Pickerel Referring Phys: [phone] AMY D CLEGG  Sonographer Comments: Technically challenging study due to limited acoustic windows. IMPRESSIONS  1. Left ventricular ejection fraction, by estimation, is 25 to 30%. The left ventricle has severely decreased function. The left ventricle demonstrates regional wall motion abnormalities (see scoring diagram/findings for description). Left ventricular diastolic parameters are consistent with Grade II diastolic dysfunction (pseudonormalization).  2. Trivial mitral valve regurgitation. FINDINGS  Left Ventricle: Left ventricular ejection fraction, by estimation, is 25 to 30%. The left ventricle has severely decreased function. The left ventricle demonstrates regional wall motion abnormalities. Left ventricular diastolic parameters are consistent  with Grade II diastolic dysfunction (pseudonormalization).  LV Wall Scoring: The mid and distal anterior septum, mid inferoseptal segment, and apex are akinetic. The entire inferior wall, apical lateral segment, mid anterolateral segment, apical anterior segment, and basal inferoseptal segment are hypokinetic. The anterior wall, posterior wall, basal anteroseptal segment, and basal anterolateral segment are normal. Mitral Valve: Trivial mitral valve regurgitation. Tricuspid Valve: Tricuspid valve regurgitation is trivial. Additional Comments: Spectral Doppler performed. Color Doppler performed.  MITRAL VALVE               TRICUSPID VALVE MV Area (PHT): 5.46 cm    TR Peak grad:   29.2 mmHg MV Decel Time: 139 msec    TR Vmax:        270.00 cm/s MV E velocity: 98.50 cm/s MV A velocity: 35.10 cm/s MV E/A ratio:  2.81 Skeet Latch MD Electronically signed by Skeet Latch MD Signature Date/Time: 03/25/2022/10:27:00 PM    Final      Medications:     Scheduled Medications:  amiodarone  200 mg Oral Daily   apixaban  5 mg Oral BID   aspirin EC  81 mg Oral  Daily   atorvastatin  80 mg Oral Daily   Chlorhexidine Gluconate Cloth  6 each Topical Daily   docusate sodium  200 mg Oral Daily   famotidine  20 mg Oral Daily   insulin aspart  0-5 Units Subcutaneous QHS   insulin aspart  0-9 Units Subcutaneous TID WC   insulin detemir  12 Units Subcutaneous Q12H   lidocaine  2 patch Transdermal Daily   sodium chloride flush  10-40 mL  Intracatheter Q12H   sodium chloride flush  3 mL Intravenous Q12H    Infusions:  ceFEPime (MAXIPIME) IV 2 g (03/26/22 0938)    PRN Medications: acetaminophen, influenza vaccine adjuvanted, morphine injection, mouth rinse, oxyCODONE, pneumococcal 20-valent conjugate vaccine, prochlorperazine, simethicone, sodium chloride, sodium chloride flush, sodium chloride flush, traMADol    Patient Profile   68 y/o male w/ h/o submassive PE in March 2023 w/ subsequent RV dysfunction that recovered post treatment with catheter directed thrombolysis, on Xarelto, admitted w/ large acute anterolateral STEMI c/b acute systolic heart failure/shock requiring placement of IABP. Now s/p CABG. AHF team consulted to assist w/ management of HF. Also COVID +   Assessment/Plan   1. Cardiogenic shock/ischemic cardiomyopathy: Echo pre-op with LV EF 25-30%, wall motion abnormalities, normal RV.  IABP placed pre-op for management of angina.  Intra-op, patient developed acute worsening of LV and RV function as well as acute rise in PA pressure (PASBP 80s-90s).  Difficult to wean off bypass, but eventually successful wean with iNO, inotropes, pressors, IABP.  After prolonged course, pressors weaned off and IABP removed.   - DBA stopped 1/22. No co-ox today - CVP 9/10. Volume status going up. Will start 40 PO lasix. AKI with Torsemide in the past - Off digoxin with AKI - Unable to use GDMT yet with AKI, now off inotropes.  - Can consider SGLT2i - Limited echo yesterday EF 25-30%, GIIDD, trivial MR and TR  2. CAD: Anterolateral MI with cath showing  80% ostial LAD stenosis and 80% ostial large ramus stenosis.  Ostial lesions in close proximity made PCI very difficult and CABG was elected.  Patient underwent LIMA-LAD and SVG-ramus on 03/12/22 - No chest pain.  - Continue ASA/statin. No Plavix with need for AC - No b-blocker with shock - Eventual referral to CR  3. H/o PE: Submassive PE in 3/23, treated with catheter-directed thrombolysis.   - On Xarelto PTA. Eliquis started 03/23/22  4. COVID-19+: Incidental finding at admission.  - resolved  5. Acute hypoxemic respiratory failure:  -  Resolved. Now on room air  6. Thrombocytopenia: Platelets as low as 57K now improved suspect post-op though IABP may have played role.  - Resolved.   7. Atrial ectopy: PACs and SVT runs, have not seen definite atrial fibrillation.  - SR today.  - now on amiodarone 200 mg daily  8. Pseudomonas bacteremia - Was on meropenem - ID following and switched back to cefepime.  - Surveillance cx drawn 1/19 NGTD - Afebrile.  - WBC 27.7 -> 25.8 -> 21.9-> 19 - Continue IS   9. Hyponatremia, hypervolemic:  - Resolved.   10. Acute gout R 1st MTP - Uric acid 16. Resolved with prednisone.    Earnie Larsson AGACNP-BC  03/26/2022 9:44 AM

## 2022-03-26 NOTE — Care Management Important Message (Signed)
Important Message  Patient Details  Name: Juan Snow MRN: [id number] Date of Birth: [birth year]   Medicare Important Message Given:  Yes     Shelda Altes 03/26/2022, 9:34 AM

## 2022-03-26 NOTE — Progress Notes (Signed)
Occupational Therapy Treatment Patient Details Name: Juan Snow MRN: [id number] DOB: 22-Oct-1955 Today's Date: 03/26/2022   History of present illness Juan Snow is an 67 y.o. male who [facility] to [facility] on [facility]/10[facility]22 with a chief complaint of CP/STEMI. 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 1/13 balloon pump removed. 1/16 Ultrasound of left chest showing small/moderate left pleural effusion & TEE. PMHx: arthritis, OA, GERD   OT comments  Pt progressing towards goals, focus on ADL practice while adhering to sternal precautions. Pt able to verbalize and adhere to sternal precautions with min cues during session, needing min guard-min A for UB/LB ADL, toilet transfer, and simulated pericare. Pt mod I for bed mobility, and supervision for mobility in room without AD. Pt provided with sternal precaution handout at end of session, reviewed moving "in the tube" along with strategies for energy conservation at home, pt verbalized understanding. Pt presenting with impairments listed below, will follow acutely. Continue to recommend OP cardiac rehab at d/c.   Recommendations for follow up therapy are one component of a multi-disciplinary discharge planning process, led by the attending physician.  Recommendations may be updated based on patient status, additional functional criteria and insurance authorization.    Follow Up Recommendations  Other (comment) (OP cardiac rehab)     Assistance Recommended at Discharge Intermittent Supervision/Assistance  Patient can return home with the following  A little help with walking and/or transfers;A little help with bathing/dressing/bathroom;Assistance with cooking/housework;Assist for transportation;Help with stairs or ramp for entrance   Equipment Recommendations  Tub/shower seat    Recommendations for Other Services      Precautions / Restrictions Precautions Precautions:  Fall;Sternal Precaution Booklet Issued: Yes (comment) (provided pt with new booklet, unable to find prior handout) Precaution Comments: pt able to verbalize and adhere to sternal precautions during session with min cues Restrictions Weight Bearing Restrictions: No Other Position/Activity Restrictions: sternal precautions       Mobility Bed Mobility Overal bed mobility: Modified Independent     Sidelying to sit: Modified independent (Device/Increase time)       General bed mobility comments: seated EOB upon departure    Transfers Overall transfer level: Modified independent Equipment used: None Transfers: Sit to/from Stand Sit to Stand: Supervision           General transfer comment: supervision for safety     Balance Overall balance assessment: Needs assistance Sitting-balance support: Feet supported Sitting balance-Leahy Scale: Good Sitting balance - Comments: can reach outside BOS (in tube) without LOB   Standing balance support: Bilateral upper extremity supported, During functional activity, No upper extremity supported Standing balance-Leahy Scale: Good Standing balance comment: able to maintain static and dynamic standing balance with BUE support on RW and mod I                           ADL either performed or assessed with clinical judgement   ADL Overall ADL's : Needs assistance/impaired Eating/Feeding: Independent;Sitting               Upper Body Dressing : Minimal assistance;Sitting Upper Body Dressing Details (indicate cue type and reason): cues/demo for sternal precautions Lower Body Dressing: Minimal assistance Lower Body Dressing Details (indicate cue type and reason): sitting able to demo donning/doffing sock Toilet Transfer: Min guard;Ambulation;Regular Glass blower/designer Details (indicate cue type and reason): cues to place hands on knees to sit slowly  Toileting- Water quality scientist and Hygiene: Min guard;Sit to/from  stand Toileting - Clothing Manipulation Details (indicate cue type and reason): reviewed technique for pericare while adhering to precautions, pt able to simulate     Functional mobility during ADLs: Min guard      Extremity/Trunk Assessment Upper Extremity Assessment Upper Extremity Assessment: Overall WFL for tasks assessed (within limits of sternal precautions)   Lower Extremity Assessment Lower Extremity Assessment: Defer to PT evaluation        Vision   Vision Assessment?: No apparent visual deficits   Perception Perception Perception: Not tested   Praxis Praxis Praxis: Not tested    Cognition Arousal/Alertness: Awake/alert Behavior During Therapy: WFL for tasks assessed/performed Overall Cognitive Status: Within Functional Limits for tasks assessed                                 General Comments: pleasant and cooperative        Exercises      Shoulder Instructions       General Comments VSS on RA throughout    Pertinent Vitals/ Pain       Pain Assessment Pain Assessment: No/denies pain  Home Living Family/patient expects to be discharged to:: Private residence Living Arrangements: Spouse/significant other;Children Available Help at Discharge: Available 24 hours/day Type of Home: House Home Access: Stairs to enter                                Prior Functioning/Environment              Frequency  Min 2X/week        Progress Toward Goals  OT Goals(current goals can now be found in the care plan section)  Progress towards OT goals: Progressing toward goals  Acute Rehab OT Goals Patient Stated Goal: none stated OT Goal Formulation: With patient Time For Goal Achievement: 04/02/22 Potential to Achieve Goals: Good ADL Goals Pt Will Perform Grooming: with supervision;standing Pt Will Perform Upper Body Dressing: with modified independence;sitting Pt Will Perform Lower Body Dressing: with min guard  assist;sit to/from stand Pt Will Transfer to Toilet: with supervision;ambulating Additional ADL Goal #1: Pt will indep maintain sternal precautions 100% of the session  Plan Discharge plan remains appropriate    Co-evaluation                 AM-PAC OT "6 Clicks" Daily Activity     Outcome Measure   Help from another person eating meals?: None Help from another person taking care of personal grooming?: A Little Help from another person toileting, which includes using toliet, bedpan, or urinal?: A Little Help from another person bathing (including washing, rinsing, drying)?: A Lot Help from another person to put on and taking off regular upper body clothing?: A Little Help from another person to put on and taking off regular lower body clothing?: A Lot 6 Click Score: 17    End of Session Equipment Utilized During Treatment: Gait belt  OT Visit Diagnosis: Unsteadiness on feet (R26.81);Other abnormalities of gait and mobility (R26.89);Muscle weakness (generalized) (M62.81);Pain Pain - Right/Left: Right   Activity Tolerance Patient tolerated treatment well   Patient Left in bed;with call bell/phone within reach;with bed alarm set (seated EOB)   Nurse Communication Mobility status;Other (comment) (pt sitting EOB with alarm on)        Time: 0258-5277 OT Time Calculation (min):  25 min  Charges: OT General Charges $OT Visit: 1 Visit OT Treatments $Self Care/Home Management : 23-37 mins  Renaye Rakers, OTD, OTR/L SecureChat Preferred Acute Rehab [phone]   Ulla Gallo 03/26/2022, 4:12 PM

## 2022-03-26 NOTE — Progress Notes (Signed)
CARDIAC REHAB PHASE I   PRE:  Rate/Rhythm: 77 NSR  BP:  Sitting: NT      SaO2: 97 RA  MODE:  Ambulation: 240 ft   AD:  RW  POST:  Rate/Rhythm: 89 NSR  BP:  Sitting: 117/78      SaO2: 97 RA  Pt amb with standby assistance, pt denies CP and SOB during amb and was returned to room w/o complaint  Amb limited d/t pt fear of enuresis down the hall. Pt did great, very motivated to get stronger. Has been referred to [facility] for CRPII.    Christen Bame  10:33 AM 03/26/2022    Service time is from 0944 to 1038.

## 2022-03-26 NOTE — TOC Initial Note (Signed)
Transition of Care [facility]) - Initial/Assessment Note    Patient Details  Name: Juan Snow MRN: [id number] Date of Birth: Jun 08, 1955  [facility]) CM/SW Contact:    Juan Rasher, RN Phone Number: [phone]  03/26/2022, 4:00 PM  Clinical Narrative:                 HF TOC CM spoke to pt's wife and pt will need a Rollator for home. HH arranged with [facility]. Contacted [facility] HH rep, Juan Snow. Has HH orders and surgery protocol per surgeon's office. [place] for [employer] for home.   Expected Discharge Plan: [place] Barriers to Discharge: Continued Medical Work up   Patient Goals and CMS Choice Patient states their goals for this hospitalization and ongoing recovery are:: to return home once stable. CMS Medicare.gov Compare Post Acute Care list provided to:: Patient Represenative (must comment) Choice offered to / list presented to : Spouse      Expected Discharge Plan and Services In-house Referral: NA Discharge Planning Services: CM Consult Post Acute Care Choice: [place] arrangements for the past 2 months: [place]                 DME Arranged: Walker rolling with seat DME Agency: [employer] Date DME Agency Contacted: 03/26/22 Time DME Agency Contacted: [id number] Representative spoke with at DME Agency: [place]: PT [place] Agency: [place] Date [place]: 03/13/22 Time [place]: [phone] Representative spoke with at [place]: [place] Arrangements/Services Living arrangements for the past 2 months: [place] Lives with:: Spouse Patient language and need for interpreter reviewed:: Yes Do you feel safe going back to the place where you live?: Yes      Need for Family Participation in Patient Care: Yes (Comment) Care giver support system in place?: Yes (comment)   Criminal Activity/Legal Involvement Pertinent to Current  Situation/Hospitalization: No - Comment as needed  Activities of Daily Living Home Assistive Devices/Equipment: None ADL Screening (condition at time of admission) Patient's cognitive ability adequate to safely complete daily activities?: Yes Is the patient deaf or have difficulty hearing?: No Does the patient have difficulty seeing, even when wearing glasses/contacts?: No Does the patient have difficulty concentrating, remembering, or making decisions?: No Patient able to express need for assistance with ADLs?: No Does the patient have difficulty dressing or bathing?: No Independently performs ADLs?: Yes (appropriate for developmental age) Does the patient have difficulty walking or climbing stairs?: No Weakness of Legs: None Weakness of Arms/Hands: None  Permission Sought/Granted Permission sought to share information with : Family Supports, Case Manager Permission granted to share information with : Yes, Verbal Permission Granted  Share Information with NAME: Juan Snow     Permission granted to share info w Relationship: wife  Permission granted to share info w Contact Information: [phone]  Emotional Assessment Appearance:: Appears stated age Attitude/Demeanor/Rapport: Engaged Affect (typically observed): Appropriate Orientation: : Oriented to Self, Oriented to Place, Oriented to  Time, Oriented to Situation Alcohol / Substance Use: Not Applicable Psych Involvement: No (comment)  Admission diagnosis:  STEMI (ST elevation myocardial infarction) [facility]) [I21.3] Patient Active Problem List   Diagnosis Date Noted   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   Arthritis of first MTP joint 03/21/2022   Bacteremia 03/20/2022   Cardiogenic shock ([place]) 36/46/8032   Acute systolic heart failure ([place]) 03/18/2022   CAD in native artery  03/18/2022   Pleural effusion on left 03/18/2022   STEMI (ST elevation myocardial infarction) ([place]) 03/10/2022   Acute pulmonary embolism with acute cor  pulmonale (HCC) 05/11/2021   Aspiration pneumonia ([place]) 03/28/2021   Severe sepsis ([place]) 03/28/2021   AKI (acute kidney injury) ([place]) 03/27/2021   Sepsis ([place]) 03/27/2021   Hypomagnesemia 03/27/2021   Chest pain 03/27/2021   Prolonged QT interval 03/27/2021   Gall bladder stones 03/27/2021   OA (osteoarthritis) of hip 06/25/2017   PCP:  Lurline Del, DO Pharmacy:   [facility] [address] [place] [address] Phone: [phone] Fax: [phone]  [id number] [place] [address] [place]Phone: [phone]: [phone] [id number] [address]: [phone]: [address]: [phone]: [phone]one: [phone]: [phone]   Social Determinants of Health (SDOH) Social History: SDOH Screenings   Food Insecurity: No Food Insecurity (03/10/2022)  Housing: Low Risk  (03/10/2022)  Transportation Needs: No Transportation Needs (03/10/2022)  Utilities: Not At Risk (03/10/2022)  Tobacco Use: Low Risk  (03/13/2022)   SDOH Interventions:     Readmission Risk Interventions     No data to display

## 2022-03-27 ENCOUNTER — Other Ambulatory Visit (HOSPITAL_COMMUNITY): Payer: Self-pay

## 2022-03-27 LAB — CULTURE, BLOOD (ROUTINE X 2)
Culture: NO GROWTH
Culture: NO GROWTH
Special Requests: ADEQUATE
Special Requests: ADEQUATE

## 2022-03-27 LAB — GLUCOSE, CAPILLARY
Glucose-Capillary: 92 mg/dL (ref 70–99)
Glucose-Capillary: 123 mg/dL — ABNORMAL HIGH (ref 70–99)
Glucose-Capillary: 121 mg/dL — ABNORMAL HIGH (ref 70–99)
Glucose-Capillary: 151 mg/dL — ABNORMAL HIGH (ref 70–99)

## 2022-03-27 LAB — BASIC METABOLIC PANEL
Anion gap: 10 (ref 5–15)
BUN: 43 mg/dL — ABNORMAL HIGH (ref 8–23)
CO2: 22 mmol/L (ref 22–32)
Calcium: 8.9 mg/dL (ref 8.9–10.3)
Chloride: 110 mmol/L (ref 98–111)
Creatinine, Ser: 1.49 mg/dL — ABNORMAL HIGH (ref 0.61–1.24)
GFR, Estimated: 52 mL/min — ABNORMAL LOW (ref >60)
Glucose, Bld: 117 mg/dL — ABNORMAL HIGH (ref 70–99)
Potassium: 4.7 mmol/L (ref 3.5–5.1)
Sodium: 142 mmol/L (ref 135–145)

## 2022-03-27 LAB — COOXEMETRY PANEL
Carboxyhemoglobin: 1.4 % (ref 0.5–1.5)
Carboxyhemoglobin: 2.3 % — ABNORMAL HIGH (ref 0.5–1.5)
Methemoglobin: <0.7 % (ref 0.0–1.5)
Methemoglobin: <0.7 % (ref 0.0–1.5)
O2 Saturation: 43.4 %
O2 Saturation: 61.2 %
Total hemoglobin: 12.6 g/dL (ref 12.0–16.0)
Total hemoglobin: 12.7 g/dL (ref 12.0–16.0)

## 2022-03-27 MED ORDER — FUROSEMIDE 40 MG PO TABS
40.0000 mg | ORAL_TABLET | Freq: Every day | ORAL | Status: DC
  Administered 2022-03-27 – 2022-03-28 (×2): 40 mg via ORAL
  Filled 2022-03-27 (×2): qty 1

## 2022-03-27 MED ORDER — CIPROFLOXACIN HCL 500 MG PO TABS
750.0000 mg | ORAL_TABLET | Freq: Two times a day (BID) | ORAL | Status: DC
  Administered 2022-03-28: 750 mg via ORAL
  Filled 2022-03-27: qty 2

## 2022-03-27 MED ORDER — SPIRONOLACTONE 12.5 MG HALF TABLET
12.5000 mg | ORAL_TABLET | Freq: Every day | ORAL | Status: DC
  Administered 2022-03-27 – 2022-03-28 (×2): 12.5 mg via ORAL
  Filled 2022-03-27 (×2): qty 1

## 2022-03-27 MED ORDER — MELATONIN 5 MG PO TABS
5.0000 mg | ORAL_TABLET | Freq: Every day | ORAL | Status: DC
  Administered 2022-03-27: 5 mg via ORAL
  Filled 2022-03-27: qty 1

## 2022-03-27 MED ORDER — POTASSIUM CHLORIDE ER 10 MEQ PO TBCR
10.0000 meq | EXTENDED_RELEASE_TABLET | Freq: Two times a day (BID) | ORAL | Status: DC
  Administered 2022-03-27: 10 meq via ORAL
  Filled 2022-03-27 (×2): qty 1

## 2022-03-27 MED FILL — Heparin Sodium (Porcine) Inj 1000 Unit/ML: INTRAMUSCULAR | Qty: 10 | Status: AC

## 2022-03-27 MED FILL — Electrolyte-R (PH 7.4) Solution: INTRAVENOUS | Qty: 4000 | Status: AC

## 2022-03-27 MED FILL — Sodium Bicarbonate IV Soln 8.4%: INTRAVENOUS | Qty: 50 | Status: AC

## 2022-03-27 MED FILL — Potassium Chloride Inj 2 mEq/ML: INTRAVENOUS | Qty: 40 | Status: AC

## 2022-03-27 MED FILL — Lidocaine HCl Local Preservative Free (PF) Inj 2%: INTRAMUSCULAR | Qty: 14 | Status: AC

## 2022-03-27 MED FILL — Calcium Chloride Inj 10%: INTRAVENOUS | Qty: 10 | Status: AC

## 2022-03-27 MED FILL — Sodium Chloride IV Soln 0.9%: INTRAVENOUS | Qty: 2000 | Status: AC

## 2022-03-27 MED FILL — Heparin Sodium (Porcine) Inj 1000 Unit/ML: Qty: 1000 | Status: AC

## 2022-03-27 NOTE — Progress Notes (Signed)
   CO-OX repeated this afternoon. Stable. 61%  Aarush Stukey NP-C  4:46 PM

## 2022-03-27 NOTE — Plan of Care (Signed)
  Problem: Education: Goal: Knowledge of General Education information will improve Description: Including pain rating scale, medication(s)/side effects and non-pharmacologic comfort measures Outcome: Progressing   Problem: Health Behavior/Discharge Planning: Goal: Ability to manage health-related needs will improve Outcome: Progressing   Problem: Clinical Measurements: Goal: Ability to maintain clinical measurements within normal limits will improve Outcome: Progressing Goal: Will remain free from infection Outcome: Progressing Goal: Diagnostic test results will improve Outcome: Progressing Goal: Respiratory complications will improve Outcome: Progressing Goal: Cardiovascular complication will be avoided Outcome: Progressing   Problem: Activity: Goal: Risk for activity intolerance will decrease Outcome: Progressing   Problem: Nutrition: Goal: Adequate nutrition will be maintained Outcome: Progressing   Problem: Coping: Goal: Level of anxiety will decrease Outcome: Progressing   Problem: Elimination: Goal: Will not experience complications related to bowel motility Outcome: Progressing Goal: Will not experience complications related to urinary retention Outcome: Progressing   Problem: Pain Managment: Goal: General experience of comfort will improve Outcome: Progressing   Problem: Safety: Goal: Ability to remain free from injury will improve Outcome: Progressing   Problem: Skin Integrity: Goal: Risk for impaired skin integrity will decrease Outcome: Progressing   Problem: Cardiac: Goal: Ability to achieve and maintain adequate cardiopulmonary perfusion will improve Outcome: Progressing Goal: Vascular access site(s) Level 0-1 will be maintained Outcome: Progressing   Problem: Fluid Volume: Goal: Ability to achieve a balanced intake and output will improve Outcome: Progressing   Problem: Physical Regulation: Goal: Complications related to the disease  process, condition or treatment will be avoided or minimized Outcome: Progressing   Problem: Respiratory: Goal: Will regain and/or maintain adequate ventilation Outcome: Progressing   Problem: Education: Goal: Will demonstrate proper wound care and an understanding of methods to prevent future damage Outcome: Progressing Goal: Knowledge of disease or condition will improve Outcome: Progressing Goal: Knowledge of the prescribed therapeutic regimen will improve Outcome: Progressing   Problem: Activity: Goal: Risk for activity intolerance will decrease Outcome: Progressing   Problem: Cardiac: Goal: Will achieve and/or maintain hemodynamic stability Outcome: Progressing   Problem: Clinical Measurements: Goal: Postoperative complications will be avoided or minimized Outcome: Progressing   Problem: Respiratory: Goal: Respiratory status will improve Outcome: Progressing   Problem: Skin Integrity: Goal: Wound healing without signs and symptoms of infection Outcome: Progressing Goal: Risk for impaired skin integrity will decrease Outcome: Progressing   Problem: Urinary Elimination: Goal: Ability to achieve and maintain adequate renal perfusion and functioning will improve Outcome: Progressing

## 2022-03-27 NOTE — Progress Notes (Signed)
Mobility Specialist Progress Note:   03/27/22 1446  Mobility  Activity Refused mobility   Pt in bed stating he's having stomach pain and would like to hold ambulation til later. Will follow-up as time allows.   Juan Snow [name] Mobility Specialist Please contact via [employer] or  Rehab Office at [phone]

## 2022-03-27 NOTE — Progress Notes (Signed)
Patient ID: Juan Snow, male   DOB: Jun 21, 1955, 67 y.o.   MRN: [id number]   Advanced Heart Failure Rounding Note  PCP-Cardiologist: None   Subjective:    S/p CABG x 2 on 03/12/22. Pre-op EF 25-30%. Prolonged post-op course due to HF, AKI and pseudomonas bacteremia   Limited echo  EF 25-30%, GIIDD, trivial MR and TR  CVP 4.    Denies SOB.   Objective:   Weight Range: 83.2 kg Body mass index is 28.73 kg/m.   Vital Signs:   Temp:  [97.9 F (36.6 C)-98.2 F (36.8 C)] 98.2 F (36.8 C) (01/24 0757) Pulse Rate:  [69-79] 70 (01/24 0757) Resp:  [18-20] 18 (01/24 0757) BP: (91-113)/(60-84) 113/82 (01/24 1109) SpO2:  [95 %-96 %] 96 % (01/24 0757) Weight:  [83.2 kg] 83.2 kg (01/24 0417) Last BM Date : 03/27/22  Weight change: Filed Weights   03/25/22 0600 03/26/22 0600 03/27/22 0417  Weight: 84.4 kg 83.6 kg 83.2 kg    Intake/Output:   Intake/Output Summary (Last 24 hours) at 03/27/2022 1305 Last data filed at 03/27/2022 1205 Gross per 24 hour  Intake --  Output 1400 ml  Net -1400 ml    CVP 4 Physical Exam  General:  Well appearing. No resp difficulty HEENT: normal Neck: supple. no JVD. Carotids 2+ bilat; no bruits. No lymphadenopathy or thryomegaly appreciated. Cor: PMI nondisplaced. Regular rate & rhythm. No rubs, gallops or murmurs. Lungs: clear Abdomen: soft, nontender, nondistended. No hepatosplenomegaly. No bruits or masses. Good bowel sounds. Extremities: no cyanosis, clubbing, rash, edema. RUE PICC Neuro: alert & orientedx3, cranial nerves grossly intact. moves all 4 extremities w/o difficulty. Affect pleasant  Telemetry   SR 70s   Labs    CBC Recent Labs    03/25/22 0500  WBC 19.3*  HGB 10.9*  HCT 34.2*  MCV 92.7  PLT 315*   Basic Metabolic Panel Recent Labs    03/25/22 0500 03/26/22 0430 03/27/22 0245  NA 138 140 142  K 4.1 4.6 4.7  CL 103 108 110  CO2 '27 23 22  '$ GLUCOSE 97 102* 117*  BUN 58* 49* 43*  CREATININE 1.83* 1.61* 1.49*   CALCIUM 9.0 8.8* 8.9  MG 2.1  --   --    Liver Function Tests No results for input(s): "AST", "ALT", "ALKPHOS", "BILITOT", "PROT", "ALBUMIN" in the last 72 hours.   No results for input(s): "LIPASE", "AMYLASE" in the last 72 hours. Cardiac Enzymes No results for input(s): "CKTOTAL", "CKMB", "CKMBINDEX", "TROPONINI" in the last 72 hours.  BNP: BNP (last 3 results) Recent Labs    05/11/21 1052  BNP 23.1    ProBNP (last 3 results) No results for input(s): "PROBNP" in the last 8760 hours.   D-Dimer No results for input(s): "DDIMER" in the last 72 hours. Hemoglobin A1C No results for input(s): "HGBA1C" in the last 72 hours.  Fasting Lipid Panel No results for input(s): "CHOL", "HDL", "LDLCALC", "TRIG", "CHOLHDL", "LDLDIRECT" in the last 72 hours.  Thyroid Function Tests No results for input(s): "TSH", "T4TOTAL", "T3FREE", "THYROIDAB" in the last 72 hours.  Invalid input(s): "FREET3"  Other results:   Imaging    No results found.   Medications:     Scheduled Medications:  amiodarone  200 mg Oral Daily   apixaban  5 mg Oral BID   aspirin EC  81 mg Oral Daily   atorvastatin  80 mg Oral Daily   Chlorhexidine Gluconate Cloth  6 each Topical Daily   [START ON 03/28/2022]  ciprofloxacin  750 mg Oral BID   docusate sodium  200 mg Oral Daily   famotidine  20 mg Oral Daily   furosemide  40 mg Oral Daily   insulin aspart  0-5 Units Subcutaneous QHS   insulin aspart  0-9 Units Subcutaneous TID WC   insulin detemir  12 Units Subcutaneous Q12H   lidocaine  2 patch Transdermal Daily   melatonin  5 mg Oral QHS   sodium chloride flush  10-40 mL Intracatheter Q12H   sodium chloride flush  3 mL Intravenous Q12H   spironolactone  12.5 mg Oral Daily    Infusions:  ceFEPime (MAXIPIME) IV 2 g (03/27/22 0934)    PRN Medications: acetaminophen, influenza vaccine adjuvanted, morphine injection, mouth rinse, oxyCODONE, pneumococcal 20-valent conjugate vaccine,  prochlorperazine, simethicone, sodium chloride, sodium chloride flush, sodium chloride flush, traMADol    Patient Profile   67 y/o male w/ h/o submassive PE in March 2023 w/ subsequent RV dysfunction that recovered post treatment with catheter directed thrombolysis, on Xarelto, admitted w/ large acute anterolateral STEMI c/b acute systolic heart failure/shock requiring placement of IABP. Now s/p CABG. AHF team consulted to assist w/ management of HF. Also COVID +   Assessment/Plan   1. Cardiogenic shock/ischemic cardiomyopathy: Echo pre-op with LV EF 25-30%, wall motion abnormalities, normal RV.  IABP placed pre-op for management of angina.  Intra-op, patient developed acute worsening of LV and RV function as well as acute rise in PA pressure (PASBP 80s-90s).  Difficult to wean off bypass, but eventually successful wean with iNO, inotropes, pressors, IABP.  After prolonged course, pressors weaned off and IABP removed.   - DBA stopped 1/22. No co-ox today - CVP 4. Continue  40 PO lasix daily. Once SGLT2i started stop lasix.  - Start spiro 12.5 mg daily - Off digoxin with AKI - Unable to use GDMT yet with AKI, now off inotropes.  - Can consider SGLT2i tomorrow  - Limited echo  EF 25-30%, GIIDD, trivial MR and TR  2. CAD: Anterolateral MI with cath showing 80% ostial LAD stenosis and 80% ostial large ramus stenosis.  Ostial lesions in close proximity made PCI very difficult and CABG was elected.  Patient underwent LIMA-LAD and SVG-ramus on 03/12/22 - No chest pain.  - Continue ASA/statin. No Plavix with need for AC - No b-blocker with shock - Eventual referral to CR  3. H/o PE: Submassive PE in 3/23, treated with catheter-directed thrombolysis.   - On Xarelto PTA. Eliquis started 03/23/22  4. COVID-19+: Incidental finding at admission.  - resolved  5. Acute hypoxemic respiratory failure:  -  Resolved. Now on room air  6. Thrombocytopenia: Platelets as low as 57K now improved suspect  post-op though IABP may have played role.  - Resolved.   7. Atrial ectopy: PACs and SVT runs, have not seen definite atrial fibrillation.  - SR today.  - Continue amio today.    8. Pseudomonas bacteremia - Was on meropenem - ID following and switched back to cefepime.  - Surveillance cx drawn 1/19 NGTD - Afebrile.  - WBC 27.7 -> 25.8 -> 21.9-> 19-> pending . CBC in am.  - Continue IS   9. Hyponatremia, hypervolemic:  - Resolved.   10. Acute gout R 1st MTP - Uric acid 16. Resolved with steroid burst. Now off.    11. DMII Hgb A1C 6.2  Repeat CO-OX today.   Angelisse Riso NP-C  1:05 PM  03/27/2022 1:05 PM

## 2022-03-27 NOTE — Progress Notes (Addendum)
[address]       ,[place] [address]             [phone]      15 Days Post-Op Procedure(s) (LRB): CORONARY ARTERY BYPASS GRAFTING (CABG) X TWO BYPASSES USING LEFT INTERNAL MAMMARY ARTERY AND RIGHT GREATER SAPHENOUS  VEIN HARVEST. (N/A) TRANSESOPHAGEAL ECHOCARDIOGRAM (TEE) (N/A) Subjective: Pt states he did not sleep well and had some chest soreness that was relieved with pain medication. He states he is ready to go home.   Objective: Vital signs in last 24 hours: Temp:  [97.8 F (36.6 C)-98 F (36.7 C)] 97.9 F (36.6 C) (01/24 0417) Pulse Rate:  [69-79] 69 (01/24 0417) Cardiac Rhythm: Normal sinus rhythm (01/23 2000) Resp:  [20] 20 (01/24 0417) BP: (91-103)/(60-81) 91/60 (01/24 0417) SpO2:  [94 %-97 %] 96 % (01/24 0417) Weight:  [83.2 kg] 83.2 kg (01/24 0417)  Hemodynamic parameters for last 24 hours: CVP:  [11 mmHg-14 mmHg] 14 mmHg  Intake/Output from previous day: 01/23 0701 - 01/24 0700 In: 240 [P.O.:240] Out: 800 [Urine:800] Intake/Output this shift: No intake/output data recorded.  General appearance: alert, cooperative, and no distress Neurologic: intact Heart: regular rate and rhythm, S1, S2 normal, no murmur, click, rub or gallop Lungs: clear to auscultation bilaterally Abdomen: soft, non-tender; bowel sounds normal; no masses,  no organomegaly Extremities: edema trace Wound: Clean and dry, no erythema or sign of infection  Lab Results: Recent Labs    03/25/22 0500  WBC 19.3*  HGB 10.9*  HCT 34.2*  PLT 535*   BMET:  Recent Labs    03/26/22 0430 03/27/22 0245  NA 140 142  K 4.6 4.7  CL 108 110  CO2 23 22  GLUCOSE 102* 117*  BUN 49* 43*  CREATININE 1.61* 1.49*  CALCIUM 8.8* 8.9    PT/INR: No results for input(s): "LABPROT", "INR" in the last 72 hours. ABG    Component Value Date/Time   PHART 7.357 03/12/2022 1724   HCO3 19.4 (L) 03/12/2022 1724   TCO2 20 (L) 03/12/2022 1724   ACIDBASEDEF 6.0 (H) 03/12/2022  1724   O2SAT 63.5 03/26/2022 1430   CBG (last 3)  Recent Labs    03/26/22 1705 03/26/22 2109 03/27/22 0620  GLUCAP 79 121* 92    Assessment/Plan: S/P Procedure(s) (LRB): CORONARY ARTERY BYPASS GRAFTING (CABG) X TWO BYPASSES USING LEFT INTERNAL MAMMARY ARTERY AND RIGHT GREATER SAPHENOUS  VEIN HARVEST. (N/A) TRANSESOPHAGEAL ECHOCARDIOGRAM (TEE) (N/A)  Neuro: Pain controlled with Tramadol.   CV: NSR with run of PVCs. HR 70s. SBP 91-103. On Amiodarone '200mg'$  QD and Eliquis '5mg'$  BID. Limited Echo 01/22 EF 25-30%. Per Heart Failure team, will start GDMT as able, possibly SGLT2i.   Pulm: Hx of submassive PE, on Eliquis '5mg'$  BID. Saturating well on RA. Ambulated twice yesterday. Continue IS, flutter valve and ambulation.    GI: +BM. No abdominal discomfort or nausea.   Endo: Preop A1C 6.2, likely prediabetic. CBGs controlled. On SSI and Levemir. Will need outpatient follow up for further surveillance.    Renal: AKI on CKD stage IIIa, Cr down to 1.49. Good UO. Per Heart Failure team will start PO Lasix '40mg'$  for mild hypervolemia.    ID: Pseudomonas bacteremia. Afebrile, last WBC 19.3. On Cefepime until 04/04/22, can transition to ciprofloxacin with renal dosing if discharged before then per ID.  Expected postop ABLA: H/H 10.9/34.2   Deconditioning: PT/OT eval recommended outpatient cardiac rehab.   Dispo: Discharge planning, working on GDMT  and titrating medications as able. Appreciate Heart Failure team assistance. Hopefully d/c by the end of the week.      LOS: 17 days    Magdalene River, PA-C 03/27/2022  Agree with above Doing well Titrating meds Home soon  Tavio Biegel O Rasean Joos

## 2022-03-27 NOTE — Progress Notes (Signed)
CARDIAC REHAB PHASE I   PRE:  Rate/Rhythm: 80 SR    BP: sitting 118/62    SaO2: 98 RA  MODE:  Ambulation: 210 ft   POST:  Rate/Rhythm: 90 SR    BP: sitting 119/85     SaO2: 96 RA  Pt stood independently and ambulated with rollator, standby assist. Tolerated well, just limited by fatigue and SOB. Rested at 155 ft and returned to recliner. VSS.  Discussed with pt and wife IS (currently 1250 ml), sternal precautions, exercise, diet, and CRPII. Also discussed HF management with booklet. Pt and wife very receptive. Will refer to [place]. Encouraged x2 more walks today. He can ambulate with his wife if he prefers. [phone]   [name] BS, ACSM-CEP 03/27/2022 10:29 AM

## 2022-03-28 ENCOUNTER — Other Ambulatory Visit (HOSPITAL_COMMUNITY): Payer: Self-pay

## 2022-03-28 LAB — COOXEMETRY PANEL
Carboxyhemoglobin: 1.6 % — ABNORMAL HIGH (ref 0.5–1.5)
Methemoglobin: <0.7 % (ref 0.0–1.5)
O2 Saturation: 54.5 %
Total hemoglobin: 12.6 g/dL (ref 12.0–16.0)

## 2022-03-28 LAB — MAGNESIUM: Magnesium: 1.9 mg/dL (ref 1.7–2.4)

## 2022-03-28 LAB — BASIC METABOLIC PANEL
Anion gap: 10 (ref 5–15)
BUN: 41 mg/dL — ABNORMAL HIGH (ref 8–23)
CO2: 22 mmol/L (ref 22–32)
Calcium: 9.1 mg/dL (ref 8.9–10.3)
Chloride: 110 mmol/L (ref 98–111)
Creatinine, Ser: 1.73 mg/dL — ABNORMAL HIGH (ref 0.61–1.24)
GFR, Estimated: 43 mL/min — ABNORMAL LOW (ref >60)
Glucose, Bld: 89 mg/dL (ref 70–99)
Potassium: 4.4 mmol/L (ref 3.5–5.1)
Sodium: 142 mmol/L (ref 135–145)

## 2022-03-28 LAB — CBC
HCT: 36.9 % — ABNORMAL LOW (ref 39.0–52.0)
Hemoglobin: 12.3 g/dL — ABNORMAL LOW (ref 13.0–17.0)
MCH: 30.2 pg (ref 26.0–34.0)
MCHC: 33.3 g/dL (ref 30.0–36.0)
MCV: 90.7 fL (ref 80.0–100.0)
Platelets: 385 10*3/uL (ref 150–400)
RBC: 4.07 MIL/uL — ABNORMAL LOW (ref 4.22–5.81)
RDW: 15.8 % — ABNORMAL HIGH (ref 11.5–15.5)
WBC: 14.1 10*3/uL — ABNORMAL HIGH (ref 4.0–10.5)
nRBC: 0 % (ref 0.0–0.2)

## 2022-03-28 LAB — GLUCOSE, CAPILLARY: Glucose-Capillary: 86 mg/dL (ref 70–99)

## 2022-03-28 MED ORDER — ATORVASTATIN CALCIUM 80 MG PO TABS
80.0000 mg | ORAL_TABLET | Freq: Every day | ORAL | 1 refills | Status: DC
  Filled 2022-03-28: qty 30, 30d supply, fill #0

## 2022-03-28 MED ORDER — FUROSEMIDE 40 MG PO TABS
40.0000 mg | ORAL_TABLET | ORAL | 11 refills | Status: DC | PRN
  Filled 2022-03-28: qty 30, 30d supply, fill #0

## 2022-03-28 MED ORDER — OXYCODONE HCL 5 MG PO TABS
5.0000 mg | ORAL_TABLET | Freq: Four times a day (QID) | ORAL | 0 refills | Status: DC | PRN
  Filled 2022-03-28: qty 28, 7d supply, fill #0

## 2022-03-28 MED ORDER — SPIRONOLACTONE 25 MG PO TABS
12.5000 mg | ORAL_TABLET | Freq: Every day | ORAL | 1 refills | Status: DC
  Filled 2022-03-28: qty 30, 60d supply, fill #0

## 2022-03-28 MED ORDER — DAPAGLIFLOZIN PROPANEDIOL 10 MG PO TABS
10.0000 mg | ORAL_TABLET | Freq: Every day | ORAL | Status: DC
  Administered 2022-03-28: 10 mg via ORAL
  Filled 2022-03-28: qty 1

## 2022-03-28 MED ORDER — AMIODARONE HCL 200 MG PO TABS
200.0000 mg | ORAL_TABLET | Freq: Every day | ORAL | 1 refills | Status: DC
  Filled 2022-03-28: qty 30, 30d supply, fill #0

## 2022-03-28 MED ORDER — ASPIRIN 81 MG PO TBEC: 81.0000 mg | DELAYED_RELEASE_TABLET | Freq: Every day | ORAL | 12 refills | Status: DC

## 2022-03-28 MED ORDER — APIXABAN 5 MG PO TABS
5.0000 mg | ORAL_TABLET | Freq: Two times a day (BID) | ORAL | 1 refills | Status: DC
  Filled 2022-03-28 – 2022-04-22 (×3): qty 60, 30d supply, fill #0

## 2022-03-28 MED ORDER — DAPAGLIFLOZIN PROPANEDIOL 10 MG PO TABS
10.0000 mg | ORAL_TABLET | Freq: Every day | ORAL | 1 refills | Status: DC
  Filled 2022-03-28: qty 30, 30d supply, fill #0

## 2022-03-28 MED ORDER — CIPROFLOXACIN HCL 750 MG PO TABS
750.0000 mg | ORAL_TABLET | Freq: Two times a day (BID) | ORAL | 0 refills | Status: DC
  Filled 2022-03-28: qty 15, 8d supply, fill #0

## 2022-03-28 NOTE — Progress Notes (Signed)
[address]       [place] [address]             [phone]      16 Days Post-Op Procedure(s) (LRB): CORONARY ARTERY BYPASS GRAFTING (CABG) X TWO BYPASSES USING LEFT INTERNAL MAMMARY ARTERY AND RIGHT GREATER SAPHENOUS  VEIN HARVEST. (N/A) TRANSESOPHAGEAL ECHOCARDIOGRAM (TEE) (N/A) Subjective: Patient states he did not sleep well because the bed is uncomfortable but otherwise has no new complaints.  Objective: Vital signs in last 24 hours: Temp:  [97.9 F (36.6 C)-98.3 F (36.8 C)] 97.9 F (36.6 C) (01/25 0614) Pulse Rate:  [64-72] 71 (01/25 0614) Cardiac Rhythm: Normal sinus rhythm (01/24 1930) Resp:  [12-20] 16 (01/25 0614) BP: (96-113)/(69-84) 110/80 (01/25 0614) SpO2:  [93 %-97 %] 95 % (01/25 0614) Weight:  [83.4 kg] 83.4 kg (01/25 0500)  Hemodynamic parameters for last 24 hours: CVP:  [2 mmHg-12 mmHg] 3 mmHg  Intake/Output from previous day: 01/24 0701 - 01/25 0700 In: 240 [P.O.:240] Out: 2350 [Urine:2350] Intake/Output this shift: No intake/output data recorded.  General appearance: alert, cooperative, and no distress Neurologic: intact Heart: regular rate and rhythm, S1, S2 normal, no murmur, click, rub or gallop Lungs: clear to auscultation bilaterally Abdomen: soft, non-tender; bowel sounds normal; no masses,  no organomegaly Extremities: edema trace Wound: Clean and dry, no erythema or sign of infection  Lab Results: No results for input(s): "WBC", "HGB", "HCT", "PLT" in the last 72 hours. BMET:  Recent Labs    03/26/22 0430 03/27/22 0245  NA 140 142  K 4.6 4.7  CL 108 110  CO2 23 22  GLUCOSE 102* 117*  BUN 49* 43*  CREATININE 1.61* 1.49*  CALCIUM 8.8* 8.9    PT/INR: No results for input(s): "LABPROT", "INR" in the last 72 hours. ABG    Component Value Date/Time   PHART 7.357 03/12/2022 1724   HCO3 19.4 (L) 03/12/2022 1724   TCO2 20 (L) 03/12/2022 1724   ACIDBASEDEF 6.0 (H) 03/12/2022 1724   O2SAT 54.5 03/28/2022  0525   CBG (last 3)  Recent Labs    03/27/22 1551 03/27/22 2115 03/28/22 0612  GLUCAP 121* 151* 86    Assessment/Plan: S/P Procedure(s) (LRB): CORONARY ARTERY BYPASS GRAFTING (CABG) X TWO BYPASSES USING LEFT INTERNAL MAMMARY ARTERY AND RIGHT GREATER SAPHENOUS  VEIN HARVEST. (N/A) TRANSESOPHAGEAL ECHOCARDIOGRAM (TEE) (N/A)  Neuro: Patient states he has no pain this AM.   CV: NSR, HR 764-71. SBP 97-110. On Amiodarone '200mg'$  QD, Eliquis '5mg'$  BID, Spironolactone 12.'5mg'$  QD. Heart Failure team to start Farxiga '10mg'$  today and switch Lasix to '40mg'$  PRN. Appreciate their assistance.   Pulm: Hx of submassive PE, on Eliquis '5mg'$  BID. Saturating well on RA. Continue IS, flutter valve and ambulation.    GI: Abdominal discomfort yesterday relieved with BM.   Endo: Preop A1C 6.2, likely prediabetic. CBGs controlled. On SSI and Levemir. Discussed with pt he will need outpatient follow up with PCP for further surveillance.    Renal: AKI on CKD stage IIIa, Cr down to 1.49. Good UO.   ID: Pseudomonas bacteremia. Afebrile, last WBC 19.3, pending AM CBC. On Cefepime until 04/04/22, can transition to ciprofloxacin with renal dosing if discharged before then per ID. Will transition to ciprofloxacin today.    Expected postop ABLA: Last H/H 10.9/34.2. Pending AM CBC.   Deconditioning: PT/OT eval recommended outpatient cardiac rehab.   Dispo: Stable for discharge from surgical standpoint. Will check with Heart Failure team if ok for  discharge from their standpoint and for discharge medication recommendations.     LOS: 18 days    Magdalene River, PA-C 03/28/2022

## 2022-03-28 NOTE — Progress Notes (Signed)
Patient given discharge instructions 

## 2022-03-28 NOTE — TOC Transition Note (Signed)
Transition of Care (TOC) - CM/SW Discharge Note Marvetta Gibbons RN, BSN Transitions of Care Unit 4E- RN Case Manager See Treatment Team for direct phone #   Patient Details  Name: Juan Snow MRN: [id number] Date of Birth: [birth year]  [facility] CM/SW Contact:  Dawayne Patricia, RN Phone Number: 03/28/2022, 12:05 PM   Clinical Narrative:    Pt stable for transition home today w/ wife.  CM spoke with wife at bedside- confirmed rollator has been delivered by [employer]. HH has been set up with [facility] with office protocol referral. Wife confirmed that they have spoken with liaison Lattie Haw and are agreeable to [facility] providing services. CM notified Lattie Haw of discharge today and Latricia Heft will f/u to schedule pt for initial visit.   Wife also has spoken with PharmD regarding new medications Farxiga and Eliquis and copay cost- pt has deductible that has not been met yet, so out of pocket cost is high at this time, and will be lower once deductible is met. Pharmacy also spoke with wife about looking into a different part D plan come open enrollment time in Oct. Should cost become a burden. Wife voiced she has no further questions for CM about drug cost/coverage at this time.   Wife to transport pt home, no further TOC needs noted   Final next level of care: Home w Home Health Services Barriers to Discharge: Barriers Resolved   Patient Goals and CMS Choice CMS Medicare.gov Compare Post Acute Care list provided to:: Patient Represenative (must comment) Choice offered to / list presented to : Spouse  Discharge Placement                         Discharge Plan and Services Additional resources added to the After Visit Summary for   In-house Referral: NA Discharge Planning Services: CM Consult Post Acute Care Choice: Home Health          DME Arranged: Walker rolling with seat DME Agency: [employer] Date DME Agency Contacted: 03/26/22 Time DME Agency Contacted:  [id number] Representative spoke with at DME Agency: [place]: PT [place]: [place] Date [place]: 03/13/22 Time [place]: [phone] Representative spoke with at [place]: [place] ([place]) Interventions [place]: No Food Insecurity (03/10/2022)  Housing: Low Risk  (03/10/2022)  Transportation Needs: No Transportation Needs (03/10/2022)  Utilities: Not At Risk (03/10/2022)  Tobacco Use: Low Risk  (03/13/2022)     Readmission Risk Interventions    03/28/2022   12:05 PM 03/28/2022   12:04 PM  Readmission Risk Prevention Plan  Transportation Screening  Complete  Medication Review ([place])  Complete  PCP or Specialist appointment within 3-5 days of discharge Complete   HRI or [place]  Complete  SW Recovery Care/Counseling Consult Complete   [place] Not Applicable

## 2022-03-28 NOTE — Progress Notes (Signed)
Patient ID: Juan Snow, male   DOB: April 05, 1955, 67 y.o.   MRN: [id number]   Advanced Heart Failure Rounding Note  PCP-Cardiologist: None   Subjective:    S/p CABG x 2 on 03/12/22. Pre-op EF 25-30%. Prolonged post-op course due to HF, AKI and pseudomonas bacteremia   Limited echo  EF 25-30%, GIIDD, trivial MR and TR  JVD not elevated. Feels good this morning. Has walked the furthest he has since being here yesterday and felt great.   Denies SOB.   Objective:   Weight Range: 83.4 kg Body mass index is 28.8 kg/m.   Vital Signs:   Temp:  [97.9 F (36.6 C)-98.3 F (36.8 C)] 97.9 F (36.6 C) (01/25 0614) Pulse Rate:  [64-72] 71 (01/25 0614) Resp:  [12-20] 16 (01/25 0614) BP: (96-113)/(69-84) 110/80 (01/25 0614) SpO2:  [93 %-97 %] 95 % (01/25 0614) Weight:  [83.4 kg] 83.4 kg (01/25 0500) Last BM Date : 03/27/22  Weight change: Filed Weights   03/26/22 0600 03/27/22 0417 03/28/22 0500  Weight: 83.6 kg 83.2 kg 83.4 kg    Intake/Output:   Intake/Output Summary (Last 24 hours) at 03/28/2022 0733 Last data filed at 03/28/2022 0019 Gross per 24 hour  Intake 240 ml  Output 2350 ml  Net -2110 ml     Physical Exam  General:  well appearing.  No respiratory difficulty HEENT: normal Neck: supple. JVD not elevated. Carotids 2+ bilat; no bruits. No lymphadenopathy or thyromegaly appreciated. Cor: PMI nondisplaced. Regular rate & rhythm. No rubs, gallops or murmurs. Lungs: clear Abdomen: soft, nontender, nondistended. No hepatosplenomegaly. No bruits or masses. Good bowel sounds. Extremities: no cyanosis, clubbing, rash, edema. PICC RUE Neuro: alert & oriented x 3, cranial nerves grossly intact. moves all 4 extremities w/o difficulty. Affect pleasant.  Telemetry   SR 70s (Personally reviewed)     Labs    CBC No results for input(s): "WBC", "NEUTROABS", "HGB", "HCT", "MCV", "PLT" in the last 72 hours.  Basic Metabolic Panel Recent Labs    03/26/22 0430 03/27/22 0245   NA 140 142  K 4.6 4.7  CL 108 110  CO2 23 22  GLUCOSE 102* 117*  BUN 49* 43*  CREATININE 1.61* 1.49*  CALCIUM 8.8* 8.9   Liver Function Tests No results for input(s): "AST", "ALT", "ALKPHOS", "BILITOT", "PROT", "ALBUMIN" in the last 72 hours.   No results for input(s): "LIPASE", "AMYLASE" in the last 72 hours. Cardiac Enzymes No results for input(s): "CKTOTAL", "CKMB", "CKMBINDEX", "TROPONINI" in the last 72 hours.  BNP: BNP (last 3 results) Recent Labs    05/11/21 1052  BNP 23.1    ProBNP (last 3 results) No results for input(s): "PROBNP" in the last 8760 hours.   D-Dimer No results for input(s): "DDIMER" in the last 72 hours. Hemoglobin A1C No results for input(s): "HGBA1C" in the last 72 hours.  Fasting Lipid Panel No results for input(s): "CHOL", "HDL", "LDLCALC", "TRIG", "CHOLHDL", "LDLDIRECT" in the last 72 hours.  Thyroid Function Tests No results for input(s): "TSH", "T4TOTAL", "T3FREE", "THYROIDAB" in the last 72 hours.  Invalid input(s): "FREET3"  Other results:   Imaging    No results found.   Medications:     Scheduled Medications:  amiodarone  200 mg Oral Daily   apixaban  5 mg Oral BID   aspirin EC  81 mg Oral Daily   atorvastatin  80 mg Oral Daily   Chlorhexidine Gluconate Cloth  6 each Topical Daily   ciprofloxacin  750 mg Oral  BID   docusate sodium  200 mg Oral Daily   famotidine  20 mg Oral Daily   furosemide  40 mg Oral Daily   insulin aspart  0-5 Units Subcutaneous QHS   insulin aspart  0-9 Units Subcutaneous TID WC   insulin detemir  12 Units Subcutaneous Q12H   lidocaine  2 patch Transdermal Daily   melatonin  5 mg Oral QHS   sodium chloride flush  10-40 mL Intracatheter Q12H   sodium chloride flush  3 mL Intravenous Q12H   spironolactone  12.5 mg Oral Daily    Infusions:    PRN Medications: acetaminophen, influenza vaccine adjuvanted, morphine injection, mouth rinse, oxyCODONE, pneumococcal 20-valent conjugate  vaccine, prochlorperazine, simethicone, sodium chloride, sodium chloride flush, sodium chloride flush, traMADol    Patient Profile   67 y/o male w/ h/o submassive PE in March 2023 w/ subsequent RV dysfunction that recovered post treatment with catheter directed thrombolysis, on Xarelto, admitted w/ large acute anterolateral STEMI c/b acute systolic heart failure/shock requiring placement of IABP. Now s/p CABG. AHF team consulted to assist w/ management of HF. Also COVID +   Assessment/Plan   1. Cardiogenic shock/ischemic cardiomyopathy: Echo pre-op with LV EF 25-30%, wall motion abnormalities, normal RV.  IABP placed pre-op for management of angina.  Intra-op, patient developed acute worsening of LV and RV function as well as acute rise in PA pressure (PASBP 80s-90s).  Difficult to wean off bypass, but eventually successful wean with iNO, inotropes, pressors, IABP.  After prolonged course, pressors weaned off and IABP removed.   - DBA stopped 1/22. Co-ox marginal but stable at 55% - JVD not elevated. Continue 40 PO lasix today. Will make PRN at discharge with addition of SGLT2i - Continue spiro 12.5 mg daily - Start farxiga 10 mg daily  - Off digoxin with AKI - AKI improving. Continue GDMT titration as tolerated. - Plan for ARB/ARNi outpatient - Limited echo  EF 25-30%, GIIDD, trivial MR and TR  2. CAD: Anterolateral MI with cath showing 80% ostial LAD stenosis and 80% ostial large ramus stenosis.  Ostial lesions in close proximity made PCI very difficult and CABG was elected.  Patient underwent LIMA-LAD and SVG-ramus on 03/12/22 - No chest pain.  - Continue ASA/statin. No Plavix with need for AC - No b-blocker with shock - Eventual referral to CR  3. H/o PE: Submassive PE in 3/23, treated with catheter-directed thrombolysis.   - On Xarelto PTA. Eliquis started 03/23/22  4. COVID-19+: Incidental finding at admission.  - resolved  5. Acute hypoxemic respiratory failure:  -  Resolved. Now  on room air  6. Thrombocytopenia: Platelets as low as 57K now improved suspect post-op though IABP may have played role.  - Resolved.   7. Atrial ectopy: PACs and SVT runs, have not seen definite atrial fibrillation.  - SR today.  - Continue amio today.    8. Pseudomonas bacteremia - Was on meropenem - ID following and switched back to cefepime.  - Surveillance cx drawn 1/19 NGTD - Afebrile.  - WBC 27.7 -> 25.8 -> 21.9-> 19-> pending . CBC in am.  - Continue IS   9. Hyponatremia, hypervolemic:  - Resolved.   10. Acute gout R 1st MTP - Uric acid 16. Resolved with steroid burst. Now off.    11. DMII - Hgb A1C 6.2 - starting SGLT2i  May be stable for discharge. Pending lab review today.   AHF meds at discharge:  Amiodarone 200 mg daily ASA 81  mg daily Eliquis 5 mg BID Atorvastatin 80 mg daily Lasix 40 mg PRN for 3lb weight gain overnight or >5lbs in a week Spironolactone 12.5 mg daily   Earnie Larsson AGACNP-BC  Advanced Heart Failure Team  7:33 AM 03/28/2022

## 2022-03-28 NOTE — Progress Notes (Signed)
TOC medications delivered to patient room.

## 2022-03-28 NOTE — Progress Notes (Signed)
Pt happy to be going home, reviewed [facility] restrictions and allowed time for questions. Pt denies questions but would like shower chair if possible. Pt looking forward to CRPII.  Christen Bame 03/28/2022 11:46 AM

## 2022-03-28 NOTE — Progress Notes (Signed)
Chest tube sutures removed, per order. Steri strips applied. Sites clean and dry.

## 2022-03-31 DIAGNOSIS — Z48812 Encounter for surgical aftercare following surgery on the circulatory system: Secondary | ICD-10-CM | POA: Diagnosis not present

## 2022-04-03 ENCOUNTER — Telehealth (HOSPITAL_COMMUNITY): Payer: Self-pay

## 2022-04-03 NOTE — Telephone Encounter (Signed)
Pt insurance is active and benefits verified through Medicare A/B. Co-pay $0.00, DED $240.00/$240.00 met, out of pocket $0.00/$0.00 met, co-insurance 20%. No pre-authorization required. Passport, 04/03/22 @ 2:13PM, PPJ#[id number]   How many CR sessions are covered? (36 sessions for TCR, 72 sessions for ICR)72 Is this a lifetime maximum or an annual maximum? Lifetime Has the member used any of these services to date? No Is there a time limit (weeks/months) on start of program and/or program completion? No     Will contact patient to see if he is interested in the Cardiac Rehab Program. If interested, patient will need to complete follow up appt. Once completed, patient will be contacted for scheduling upon review by the RN Navigator.

## 2022-04-03 NOTE — Progress Notes (Signed)
ADVANCED HF CLINIC CONSULT NOTE  Primary Care: Lurline Del, DO CVTS: Dr. Kipp Brood HF Cardiologist: Dr. Aundra Dubin  HPI: 67 y.o. male with a history of arthritis, GERD and submassive PE 3/23, treated with catheter directed thrombolysis, on Xarelto.  Admitted 1/24 w/ acute anterolateral STEMI. Incidentally found to be COVID +. Emergent cath showed high grade ostial LAD to proximal LAD lesion in the 80% stenosis range as well as a 80% ramus intermedius lesion. Ventriculography demonstrated an anterior wall motion abnormality with an LVEDP of 22 mmHg. Multiple viewing angles were employed to try to separate the ostium of the ramus from the ostium of the LAD. This could not be accomplished. Given that the patient had TIMI-3 flow in all vessels, percutaneous intervention was deferred. Felt best suited for surgical revascularization. IABP placed and referred for CABG. Pre-op echo showed severely reduced LVEF 25-30%, RV normal, trivial MR w/ AK of the mid and distal anterior wall, mid and distal lateral wall, mid and distal  anterior septum, entire apex, mid anterolateral segment, and mid inferoseptal walls (prior Echo 3/23 in setting of submassive PE w/ mod reduced RV and LVEF 50-55%, repeat limited echo 6/23 showed RV function returned to normal w/ LVEF up to 60-65%). He underwent CABG x 2 w/ LIMA-LAD and SVG-ramus. Intra-op, he developed acute worsening of LV and RV function as well as acute rise in PA pressure (PASBP 80s-90s).  Difficult to wean off bypass, but eventually successful wean with iNO, inotropes, pressors, IABP.  AHF consulted to help with management. Eventually able to wean pressors and remove IABP. Hospitalization c/b pseudomonas bacteremia, ID consulted and completed IV abx (unclear source). GDMT titrated but limited by AKI. He was discharged home, weight 183 lbs.  Today he returns for post hospital HF follow up with his wife. Overall feeling fine. He was very active before his surgery and  is anxious to get back to the gym and golfing. Has some foot swelling but has not needed lasix. He is not short of breath walking on flat ground or up steps. Denies palpitations, abnormal bleeding, CP, dizziness, or PND/Orthopnea. Appetite ok. No fever or chills. Weight at home 171-173 pounds. Taking all medications. Worked for [employer] x 35 years in the OR.   ECG (personally reviewed): NSR 68 bpm  Labs (1/24): K 4.4, creatinine 1.73  Cardiac Studies:   - Ltd echo (1/24, post op): EF 25-30%, GIIDD, trivial MR and TR  - S/p CABG x 2 on 03/12/22.    - Echo (1/24): EF 25-30%, grade II DD, normal RV  .  - Ltd echo (6/23), post tx PE: EF 60-65%, normal RV systolic function    - Echo (3/23, setting of submassive PE): EF 50-55%, RV severely enlarged w/ mod reduced systolic function    Review of Systems: [y] = yes, '[ ]'$  = no   General: Weight gain '[ ]'$ ; Weight loss '[ ]'$ ; Anorexia '[ ]'$ ; Fatigue '[ ]'$ ; Fever '[ ]'$ ; Chills '[ ]'$ ; Weakness '[ ]'$   Cardiac: Chest pain/pressure '[ ]'$ ; Resting SOB '[ ]'$ ; Exertional SOB '[ ]'$ ; Orthopnea '[ ]'$ ; Pedal Edema Blue.Reese ]; Palpitations '[ ]'$ ; Syncope '[ ]'$ ; Presyncope '[ ]'$ ; Paroxysmal nocturnal dyspnea'[ ]'$   Pulmonary: Cough '[ ]'$ ; Wheezing'[ ]'$ ; Hemoptysis'[ ]'$ ; Sputum '[ ]'$ ; Snoring '[ ]'$   GI: Vomiting'[ ]'$ ; Dysphagia'[ ]'$ ; Melena'[ ]'$ ; Hematochezia '[ ]'$ ; Heartburn'[ ]'$ ; Abdominal pain '[ ]'$ ; Constipation '[ ]'$ ; Diarrhea '[ ]'$ ; BRBPR '[ ]'$   GU: Hematuria'[ ]'$ ; Dysuria '[ ]'$ ; Nocturia'[ ]'$   Vascular: Pain  in legs with walking '[ ]'$ ; Pain in feet with lying flat '[ ]'$ ; Non-healing sores '[ ]'$ ; Stroke '[ ]'$ ; TIA '[ ]'$ ; Slurred speech '[ ]'$ ;  Neuro: Headaches'[ ]'$ ; Vertigo'[ ]'$ ; Seizures'[ ]'$ ; Paresthesias'[ ]'$ ;Blurred vision '[ ]'$ ; Diplopia '[ ]'$ ; Vision changes '[ ]'$   Ortho/Skin: Arthritis Blue.Reese ]; Joint pain '[ ]'$ ; Muscle pain '[ ]'$ ; Joint swelling '[ ]'$ ; Back Pain '[ ]'$ ; Rash '[ ]'$   Psych: Depression'[ ]'$ ; Anxiety'[ ]'$   Heme: Bleeding problems '[ ]'$ ; Clotting disorders '[ ]'$ ; Anemia '[ ]'$   Endocrine: Diabetes '[ ]'$ ; Thyroid dysfunction'[ ]'$   Past Medical History:  Diagnosis  Date   Arthritis    OA   Cough    WHITE SPUTUM OCC, NO FEVER COLD SYMPTOMS MAINLY   GERD (gastroesophageal reflux disease)    Current Outpatient Medications  Medication Sig Dispense Refill   amiodarone (PACERONE) 200 MG tablet Take 1 tablet (200 mg total) by mouth daily. 30 tablet 1   apixaban (ELIQUIS) 5 MG TABS tablet Take 1 tablet (5 mg total) by mouth 2 (two) times daily. 60 tablet 1   Ascorbic Acid (VITAMIN C PO) Take 1 tablet by mouth daily.     aspirin EC 81 MG tablet Take 1 tablet (81 mg total) by mouth daily. Swallow whole. 30 tablet 12   atorvastatin (LIPITOR) 80 MG tablet Take 1 tablet (80 mg total) by mouth daily. 30 tablet 1   Cholecalciferol (VITAMIN D-3 PO) Take 1 tablet by mouth daily.     Coenzyme Q10 (CO Q-10 PO) Take 1 capsule by mouth daily.     dapagliflozin propanediol (FARXIGA) 10 MG TABS tablet Take 1 tablet (10 mg total) by mouth daily. 30 tablet 1   famotidine-calcium carbonate-magnesium hydroxide (PEPCID COMPLETE) 10-800-165 MG chewable tablet Chew 1-2 tablets by mouth 2 (two) times daily as needed (acid reflux).     furosemide (LASIX) 40 MG tablet Take 1 tablet (40 mg total) by mouth as needed (Take one tablet if weight gain of 3 pounds overnight or greater than 5 pounds in 1 week). 30 tablet 11   Menaquinone-7 (VITAMIN K2 PO) Take 1 tablet by mouth daily.     Omega-3 Fatty Acids (FISH OIL) 1000 MG CAPS Take 1 capsule (1,000 mg total) by mouth daily.  0   OVER THE COUNTER MEDICATION Take 15 mLs by mouth daily. Black Seed Oil.     oxyCODONE (OXY IR/ROXICODONE) 5 MG immediate release tablet Take 1 tablet (5 mg total) by mouth every 6 (six) hours as needed for severe pain. 28 tablet 0   spironolactone (ALDACTONE) 25 MG tablet Take 0.5 tablets (12.5 mg total) by mouth daily. 30 tablet 1   No current facility-administered medications for this encounter.   No Known Allergies  Social History   Socioeconomic History   Marital status: Married    Spouse name: Not on  file   Number of children: Not on file   Years of education: Not on file   Highest education level: Not on file  Occupational History   Not on file  Tobacco Use   Smoking status: Never   Smokeless tobacco: Never  Vaping Use   Vaping Use: Never used  Substance and Sexual Activity   Alcohol use: No   Drug use: No   Sexual activity: Not on file  Other Topics Concern   Not on file  Social History Narrative   Not on file   Social Determinants of Health   Financial Resource Strain: Not on file  Food Insecurity: No Food Insecurity (03/10/2022)   Hunger Vital Sign    Worried About Running Out of Food in the Last Year: Never true    Ran Out of Food in the Last Year: Never true  Transportation Needs: No Transportation Needs (03/10/2022)   PRAPARE - Hydrologist (Medical): No    Lack of Transportation (Non-Medical): No  Physical Activity: Not on file  Stress: Not on file  Social Connections: Not on file  Intimate Partner Violence: Not At Risk (03/10/2022)   Humiliation, Afraid, Rape, and Kick questionnaire    Fear of Current or Ex-Partner: No    Emotionally Abused: No    Physically Abused: No    Sexually Abused: No   Family History  Problem Relation Age of Onset   CAD Brother    Hypertension Other    Diabetes Neg Hx    BP 110/70   Pulse 66   Wt 79.8 kg (176 lb)   SpO2 100%   BMI 27.57 kg/m   Wt Readings from Last 3 Encounters:  04/05/22 79.8 kg (176 lb)  03/28/22 83.4 kg (183 lb 13.8 oz)  12/06/21 85.3 kg (188 lb)   PHYSICAL EXAM: General:  NAD. No resp difficulty, walked into clinic HEENT: Normal Neck: Supple. No JVD. Carotids 2+ bilat; no bruits. No lymphadenopathy or thryomegaly appreciated. Cor: PMI nondisplaced. Regular rate & rhythm. No rubs, gallops or murmurs. Lungs: Clear Abdomen: Soft, nontender, nondistended. No hepatosplenomegaly. No bruits or masses. Good bowel sounds. Extremities: No cyanosis, clubbing, rash, trace pedal  edema Neuro: Alert & oriented x 3, cranial nerves grossly intact. Moves all 4 extremities w/o difficulty. Affect pleasant.  ASSESSMENT & PLAN: 1. Chronic Systolic Heart Failure: iCM. Echo pre-op with LV EF 25-30%, wall motion abnormalities, normal RV.  IABP placed pre-op for management of angina.  Intra-op, patient developed acute worsening of LV and RV function as well as acute rise in PA pressure (PASBP 80s-90s).  Difficult to wean off bypass, but eventually successful wean with iNO, inotropes, pressors, IABP. Limited echo showed  EF 25-30%, GIIDD, trivial MR and TR After prolonged course, pressors weaned off and IABP removed.  Today, NYHA II. He is not volume overloaded on exam. - Start losartan 25 mg daily. BMET today, repeat in 10 days. - Continue Farxiga 10 mg daily. - Continue spiro 12.5 mg daily. - Continue Lasix 40 mg PRN. - Off digoxin with AKI. - Repeat echo in 3 months when GDMT optimized. 2. CAD: s/p anterolateral MI with cath showing 80% ostial LAD stenosis and 80% ostial large ramus stenosis.  Ostial lesions in close proximity made PCI very difficult and CABG was elected.  Underwent LIMA-LAD and SVG-ramus on 03/12/22. No ischemic chest pain. - Continue ASA/statin. No Plavix with need for AC. - No b-blocker with recent shock. Plan to add Toprol next if able. - Eventual referral to CR when cleared by CVTS. 3. H/o PE: Submassive PE in 3/23, treated with catheter-directed thrombolysis. Continue Eliquis. No bleeding issues. - CBC today. 4. Thrombocytopenia: Plts down to 57K, now improved. Suspect post-op though IABP may have played role.  - Resolved. CBC today. 5. Atrial ectopy: PACs and SVT runs, have not seen definite atrial fibrillation. NSR on ECG today. - Decrease amiodarone to 100 mg daily. Plan to stop next visit if no further ectopy. 6. Pseudomonas bacteremia: Unclear source. ID following during admission. Surveillance BCx 03/22/22 NGTD. Finished 2 weeks of abx. 7. Pre-Diabetes:  Hgb A1C 6.2. -  Continue SGLT2i.   Follow up in 3 weeks with PharmD (add low dose Toprol if able), 6 weeks with APP, and 12 weeks with Dr. Aundra Dubin + echo.  Allena Katz, FNP-BC 04/05/22

## 2022-04-03 NOTE — Telephone Encounter (Signed)
Called patient to see if he is interested in the Cardiac Rehab Program. Patient expressed interest. Explained scheduling process and went over insurance, patient verbalized understanding. Will contact patient for scheduling once f/u has been completed.  °

## 2022-04-05 ENCOUNTER — Ambulatory Visit (HOSPITAL_COMMUNITY)
Admit: 2022-04-05 | Discharge: 2022-04-05 | Disposition: A | Discharge status: Home / Self Care | Payer: Medicare Other | Attending: Family Medicine | Admitting: Family Medicine

## 2022-04-05 ENCOUNTER — Encounter (HOSPITAL_COMMUNITY): Payer: Self-pay

## 2022-04-05 ENCOUNTER — Ambulatory Visit (INDEPENDENT_AMBULATORY_CARE_PROVIDER_SITE_OTHER): Payer: Self-pay | Admitting: Thoracic Surgery (Cardiothoracic Vascular Surgery)

## 2022-04-05 VITALS — BP 110/70 | HR 66 | Wt 176.0 lb

## 2022-04-05 DIAGNOSIS — I25119 Atherosclerotic heart disease of native coronary artery with unspecified angina pectoris: Secondary | ICD-10-CM | POA: Insufficient documentation

## 2022-04-05 DIAGNOSIS — I5022 Chronic systolic (congestive) heart failure: Secondary | ICD-10-CM | POA: Insufficient documentation

## 2022-04-05 DIAGNOSIS — R7303 Prediabetes: Secondary | ICD-10-CM

## 2022-04-05 DIAGNOSIS — I2510 Atherosclerotic heart disease of native coronary artery without angina pectoris: Secondary | ICD-10-CM | POA: Diagnosis not present

## 2022-04-05 DIAGNOSIS — I491 Atrial premature depolarization: Secondary | ICD-10-CM

## 2022-04-05 DIAGNOSIS — Z7901 Long term (current) use of anticoagulants: Secondary | ICD-10-CM | POA: Insufficient documentation

## 2022-04-05 DIAGNOSIS — K219 Gastro-esophageal reflux disease without esophagitis: Secondary | ICD-10-CM | POA: Diagnosis not present

## 2022-04-05 DIAGNOSIS — I5021 Acute systolic (congestive) heart failure: Secondary | ICD-10-CM

## 2022-04-05 DIAGNOSIS — A498 Other bacterial infections of unspecified site: Secondary | ICD-10-CM

## 2022-04-05 DIAGNOSIS — I4891 Unspecified atrial fibrillation: Secondary | ICD-10-CM | POA: Insufficient documentation

## 2022-04-05 DIAGNOSIS — Z951 Presence of aortocoronary bypass graft: Secondary | ICD-10-CM | POA: Insufficient documentation

## 2022-04-05 DIAGNOSIS — R7881 Bacteremia: Secondary | ICD-10-CM | POA: Diagnosis not present

## 2022-04-05 DIAGNOSIS — Z01810 Encounter for preprocedural cardiovascular examination: Secondary | ICD-10-CM | POA: Insufficient documentation

## 2022-04-05 DIAGNOSIS — M1990 Unspecified osteoarthritis, unspecified site: Secondary | ICD-10-CM | POA: Insufficient documentation

## 2022-04-05 DIAGNOSIS — Z79899 Other long term (current) drug therapy: Secondary | ICD-10-CM | POA: Diagnosis not present

## 2022-04-05 DIAGNOSIS — I252 Old myocardial infarction: Secondary | ICD-10-CM | POA: Diagnosis not present

## 2022-04-05 DIAGNOSIS — Z86711 Personal history of pulmonary embolism: Secondary | ICD-10-CM | POA: Diagnosis not present

## 2022-04-05 DIAGNOSIS — D696 Thrombocytopenia, unspecified: Secondary | ICD-10-CM | POA: Diagnosis not present

## 2022-04-05 LAB — CBC
HCT: 40 % (ref 39.0–52.0)
Hemoglobin: 13.1 g/dL (ref 13.0–17.0)
MCH: 30.2 pg (ref 26.0–34.0)
MCHC: 32.8 g/dL (ref 30.0–36.0)
MCV: 92.2 fL (ref 80.0–100.0)
Platelets: 191 10*3/uL (ref 150–400)
RBC: 4.34 MIL/uL (ref 4.22–5.81)
RDW: 16.8 % — ABNORMAL HIGH (ref 11.5–15.5)
WBC: 6.1 10*3/uL (ref 4.0–10.5)
nRBC: 0 % (ref 0.0–0.2)

## 2022-04-05 LAB — BASIC METABOLIC PANEL
Anion gap: 10 (ref 5–15)
BUN: 27 mg/dL — ABNORMAL HIGH (ref 8–23)
CO2: 23 mmol/L (ref 22–32)
Calcium: 9.1 mg/dL (ref 8.9–10.3)
Chloride: 106 mmol/L (ref 98–111)
Creatinine, Ser: 1.43 mg/dL — ABNORMAL HIGH (ref 0.61–1.24)
GFR, Estimated: 54 mL/min — ABNORMAL LOW (ref >60)
Glucose, Bld: 99 mg/dL (ref 70–99)
Potassium: 3.9 mmol/L (ref 3.5–5.1)
Sodium: 139 mmol/L (ref 135–145)

## 2022-04-05 MED ORDER — AMIODARONE HCL 200 MG PO TABS: 100.0000 mg | ORAL_TABLET | Freq: Every day | ORAL | 1 refills | Status: DC

## 2022-04-05 MED ORDER — LOSARTAN POTASSIUM 25 MG PO TABS: 12.5000 mg | ORAL_TABLET | Freq: Every day | ORAL | 3 refills | Status: DC

## 2022-04-05 NOTE — Progress Notes (Signed)
     [address]       [place] [address]             [phone]       Patient: Home Provider: Office Consent for Telemedicine visit obtained.  Today's visit was completed via a real-time telehealth (see specific modality noted below). The patient/authorized person provided oral consent at the time of the visit to engage in a telemedicine encounter with the present provider at [facility]. The patient/authorized person was informed of the potential benefits, limitations, and risks of telemedicine. The patient/authorized person expressed understanding that the laws that protect confidentiality also apply to telemedicine. The patient/authorized person acknowledged understanding that telemedicine does not provide emergency services and that he or she would need to call 911 or proceed to the nearest hospital for help if such a need arose.   Total time spent in the clinical discussion 10 minutes.  Telehealth Modality: Phone visit (audio only)  I had a telephone visit with  Juan Snow who is s/p CABG.  Overall doing well.  Pain is minimal.  Ambulating well. Vitals have been stable.  Juan Snow will see [place] back in 1 month with a chest x-ray for cardiac rehab clearance.  Adrian Specht Bary Leriche

## 2022-04-05 NOTE — Patient Instructions (Addendum)
Thank you for coming in today  Labs were done today, if any labs are abnormal the clinic will call you No news is good news  DECREASE Amiodarone to 100 mg 1/2 tablet daily START Losartan 12.5 mg 1/2 tablet nightly   Your physician recommends that you schedule a follow-up appointment in:  3 weeks with Pharmacy  6 weeks in clinic  12 weeks with Dr. Aundra Dubin with echocardiogram   Your physician has requested that you have an echocardiogram. Echocardiography is a painless test that uses sound waves to create images of your heart. It provides your doctor with information about the size and shape of your heart and how well your heart's chambers and valves are working. This procedure takes approximately one hour. There are no restrictions for this procedure.   Your physician recommends that you return for lab work in:  10 days BMET    Do the following things EVERYDAY: Weigh yourself in the morning before breakfast. Write it down and keep it in a log. Take your medicines as prescribed Eat low salt foods--Limit salt (sodium) to 2000 mg per day.  Stay as active as you can everyday Limit all fluids for the day to less than 2 liters  At the [facility], you and your health needs are our priority. As part of our continuing mission to provide you with exceptional heart care, we have created designated Provider Care Teams. These Care Teams include your primary Cardiologist (physician) and Advanced Practice Providers (APPs- Physician Assistants and Nurse Practitioners) who all work together to provide you with the care you need, when you need it.   You may see any of the following providers on your designated Care Team at your next follow up: Dr Glori Bickers Dr Loralie Champagne Dr. Roxana Hires, NP Lyda Jester, [place] [facility] [place], [place] Forestine Na, NP Audry Riles, PharmD   Please be sure to bring in all your medications bottles to every  appointment.    Thank you for choosing [facility]   If you have any questions or concerns before your next appointment please send [place] a message through [place] or call our office at [phone].    TO LEAVE A MESSAGE FOR THE NURSE SELECT OPTION 2, PLEASE LEAVE A MESSAGE INCLUDING: YOUR NAME DATE OF BIRTH CALL BACK NUMBER REASON FOR CALL**this is important as we prioritize the call backs  YOU WILL RECEIVE A CALL BACK THE SAME DAY AS LONG AS YOU CALL BEFORE 4:00 PM

## 2022-04-11 ENCOUNTER — Other Ambulatory Visit (HOSPITAL_COMMUNITY): Payer: Self-pay

## 2022-04-11 NOTE — Progress Notes (Signed)
Cardiology Office Note:    Date:  04/12/2022   ID:  Worthy Keeler, DOB Nov 06, 1955, MRN [id number]  PCP:  Lurline Del, [place] Prov[facility] Cardiologist:  Early Osmond, MD Advanced Heart Failure:  Loralie Champagne, MD    Referring MD: Lurline Del, DO   Patient Profile: Coronary artery disease Anterolateral STEMI 03/10/2022 Tx w CABG x 2 (LIMA-LAD, SVG-RI) High grade oLAD and RI stenosis >> managed w IABP C/b Supraventricular Tachycardia/atrial ectopy; AKI (SCr ? 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 TTE 03/11/2022: EF 25-30, GR 2 DD, normal RVSF, mildly elev PASP, trivial MR, RAP 3 Limited TTE 03/25/22: EF 25-30, Gr 2 DD, trivial MR, dist ant-sept/inf-sept/apical AK; inf/apico-lat/mid ant-lat/apico-ant/basal inf-sept HK Nonsustained ventricular tachycardia Pulmonary embolism S/p submassive PE in 05/2021-Tx catheter directed thrombolysis; Xarelto Rx Hyperlipidemia Pre-diabetes     History of Present Illness:   Juan Snow is a 67 y.o. male with the above problem list.  He was admitted 03/10/2022-03/28/2022 with an anterolateral STEMI.  Cardiac catheterization demonstrated severe ostial LAD and ramus intermediate stenosis.  He was placed on IABP.  Echocardiogram demonstrated EF 25-30.  He was referred for CABG with Dr. Kipp Brood.  Postop course was noted for runs of SVT and he was placed on amiodarone and digoxin.  He also underwent left thoracentesis secondary to pleural effusion.  He required aggressive diuresis with Lasix drip and metolazone.  He was followed by the advanced heart failure team.  His hospitalization was further complicated by AKI (max creatinine 3.48) as well as sepsis (Pseudomonas).  He was also followed by ID.  PT recommended outpatient cardiac rehab.  He was kept on oral amiodarone at discharge for atrial ectopy.  Anticoagulation with Eliquis was resumed prior to discharge.  Creatinine improved to 1.73  at discharge.  He was seen in follow-up at the heart failure clinic 04/05/22.  Losartan 25 mg was added to his medical regimen which included Farxiga, Lasix and spironolactone.  Of note, he is not on Plavix due to the need for anticoagulation.  He has several follow-up appointments pending with the heart failure clinic.  He returns for f/u on CAD, CHF. He is here with his wife. He has been doing very well. He is walking several times a day. He has not had shortness of breath, orthopnea, leg edema. He has not had to take Lasix. His chest is still sore but improving. He is eager to get back to exercise. He saw Dr. Kipp Brood last week and the plan is to see him in March to clear him for [facility]. He has f/u pending next month with the CHF clinic as well.   EKG: NSR, HR 74, normal axis, T wave inversions in 2, 3, aVF and V3-V6, QTc 477, similar to prior tracing     Reviewed and updated this encounter:  Tobacco  Allergies  Meds  Problems  Med Hx  Surg Hx  Fam Hx     Review of Systems  Respiratory:  Positive for cough (rare; dry).     Labs/Other Test Reviewed:   Recent Labs: 05/11/2021: B Natriuretic Peptide 23.1 03/10/2022: ALT 24 03/28/2022: Magnesium 1.9 04/05/2022: BUN 27; Creatinine, Ser 1.43; Hemoglobin 13.1; Platelets 191; Potassium 3.9; Sodium 139   Recent Lipid Panel Recent Labs    03/10/22 1155  CHOL 210*  TRIG 239*  HDL 41  VLDL 48*  LDLCALC 121*     Risk Assessment/Calculations/Metrics:  Physical Exam:   VS:  BP 102/80   Pulse 70   Ht 5' 7"$  (1.702 m)   Wt 178 lb 3.2 oz (80.8 kg)   SpO2 98%   BMI 27.91 kg/m    Wt Readings from Last 3 Encounters:  04/12/22 178 lb 3.2 oz (80.8 kg)  04/05/22 176 lb (79.8 kg)  03/28/22 183 lb 13.8 oz (83.4 kg)    Constitutional:      Appearance: Healthy appearance. Not in distress.  Neck:     Vascular: JVD normal.  Pulmonary:     Breath sounds: Normal breath sounds. No wheezing. No rales.  Cardiovascular:     Normal rate.  Regular rhythm. Normal S1. Normal S2.      Murmurs: There is no murmur.  Edema:    Peripheral edema absent.  Abdominal:     Palpations: Abdomen is soft.         ASSESSMENT & PLAN:   ST elevation myocardial infarction (STEMI) of anterolateral wall (HCC) S/p ant-lat STEMI tx with IABP and subsequent CABG c/b AKI, congestive heart failure, Pseudomonas sepsis, Supraventricular Tachycardia. He is doing very well. He is walking daily and eager to increase his activity. He will see Dr. Kipp Brood next month to get cleared for cardiac rehab from a surgical standpoint.  He is not on Plavix as he does require therapy with Eliquis.  Continue aspirin 81 mg daily, Eliquis 5 mg twice daily, Lipitor 80 mg daily.  Low blood pressure has limited initiation of beta-blocker therapy.  He remains on amiodarone.  He has follow-up next month with heart failure clinic.  Arrange follow-up with Dr.Thukkani in 2 to 3 months.  HFrEF (heart failure with reduced ejection fraction) (HCC) Ischemic cardiomyopathy.  EF 25-30.  Volume status stable.  As noted, soft blood pressure has limited initiation of beta-blocker therapy.  He recently had a systolic blood pressure in the 80s at home.  He is not symptomatic with this.  He remains on amiodarone which does have some beta-blocker properties.  Hold off on initiating metoprolol/carvedilol at this time.  He does have follow-up with heart failure next month.  If his blood pressure is stable at that time, consider adding beta-blocker.  Continue Farxiga 10 mg daily, losartan 12.5 mg daily, spironolactone 12.5 mg daily.  He has follow-up labs with heart failure clinic next week.  Follow-up CHF appointment 05/16/2022.  SVT (supraventricular tachycardia) He had episodes of SVT and atrial ectopy during his admission.  He was placed on amiodarone with control of this.  Consider discontinuing amiodarone in the next 1 to 2 months.  Ischemic cardiomyopathy EF 25-30.  Arrange follow-up  echocardiogram 90 days post revascularization (April 2024).  If EF <35, refer to EP for ICD.  Hyperlipidemia LDL goal <70 Continue Lipitor 80 mg daily.  Arrange fasting CMET, lipids in 6 to 8 weeks.  AKI (acute kidney injury) ([place]) Creatinine has significantly improved.  He has follow-up labs again next week with the heart failure clinic.     Cardiac Rehabilitation Eligibility Assessment  The patient is ready to start cardiac rehabilitation pending clearance from the cardiac surgeon.        Dispo:  Return in about 3 months (around 07/11/2022) for Routine Follow Up, w/ Dr. Ali Lowe.   Signed, Richardson Dopp, PA-C  04/12/2022 9:16 AM    [facility] [address], [place], [place]  [address] Phone: [phone]; Fax: [phone]

## 2022-04-12 ENCOUNTER — Encounter: Payer: Self-pay | Admitting: Physician Assistant

## 2022-04-12 ENCOUNTER — Ambulatory Visit: Payer: Medicare Other | Attending: Physician Assistant | Admitting: Physician Assistant

## 2022-04-12 ENCOUNTER — Telehealth: Payer: Self-pay | Admitting: Physician Assistant

## 2022-04-12 ENCOUNTER — Other Ambulatory Visit (HOSPITAL_COMMUNITY): Payer: Self-pay

## 2022-04-12 VITALS — BP 102/80 | HR 70 | Ht 67.0 in | Wt 178.2 lb

## 2022-04-12 DIAGNOSIS — I471 Supraventricular tachycardia, unspecified: Secondary | ICD-10-CM | POA: Insufficient documentation

## 2022-04-12 DIAGNOSIS — E785 Hyperlipidemia, unspecified: Secondary | ICD-10-CM | POA: Diagnosis present

## 2022-04-12 DIAGNOSIS — I2109 ST elevation (STEMI) myocardial infarction involving other coronary artery of anterior wall: Secondary | ICD-10-CM | POA: Insufficient documentation

## 2022-04-12 DIAGNOSIS — I5020 Unspecified systolic (congestive) heart failure: Secondary | ICD-10-CM | POA: Diagnosis present

## 2022-04-12 DIAGNOSIS — I5021 Acute systolic (congestive) heart failure: Secondary | ICD-10-CM | POA: Insufficient documentation

## 2022-04-12 DIAGNOSIS — N179 Acute kidney failure, unspecified: Secondary | ICD-10-CM | POA: Diagnosis present

## 2022-04-12 DIAGNOSIS — I255 Ischemic cardiomyopathy: Secondary | ICD-10-CM | POA: Insufficient documentation

## 2022-04-12 MED ORDER — METOPROLOL SUCCINATE ER 25 MG PO TB24
12.5000 mg | ORAL_TABLET | Freq: Every day | ORAL | 3 refills | Status: DC
  Filled 2022-04-12: qty 45, 90d supply, fill #0
  Filled 2022-07-01: qty 45, 90d supply, fill #1

## 2022-04-12 NOTE — Assessment & Plan Note (Signed)
Creatinine has significantly improved.  He has follow-up labs again next week with the heart failure clinic.

## 2022-04-12 NOTE — Telephone Encounter (Signed)
Patient is returning call. Requesting return call.

## 2022-04-12 NOTE — Assessment & Plan Note (Signed)
He had episodes of SVT and atrial ectopy during his admission.  He was placed on amiodarone with control of this.  Consider discontinuing amiodarone in the next 1 to 2 months.

## 2022-04-12 NOTE — Patient Instructions (Signed)
Medication Instructions:  Your physician recommends that you continue on your current medications as directed. Please refer to the Current Medication list given to you today.  *If you need a refill on your cardiac medications before your next appointment, please call your pharmacy*   Lab Work: CMET and Lipids in 8 weeks If you have labs (blood work) drawn today and your tests are completely normal, you will receive your results only by: [place] (if you have MyChart) OR A paper copy in the mail If you have any lab test that is abnormal or we need to change your treatment, we will call you to review the results.   Testing/Procedures: ECHO in April Your physician has requested that you have an echocardiogram. Echocardiography is a painless test that uses sound waves to create images of your heart. It provides your doctor with information about the size and shape of your heart and how well your heart's chambers and valves are working. This procedure takes approximately one hour. There are no restrictions for this procedure. Please do NOT wear cologne, perfume, aftershave, or lotions (deodorant is allowed). Please arrive 15 minutes prior to your appointment time.   Follow-Up: At [facility], you and your health needs are our priority.  As part of our continuing mission to provide you with exceptional heart care, we have created designated Provider Care Teams.  These Care Teams include your primary Cardiologist (physician) and Advanced Practice Providers (APPs -  Physician Assistants and Nurse Practitioners) who all work together to provide you with the care you need, when you need it.  Your next appointment:   2 - 3 month(s)  Provider:   Dr. Ali Lowe

## 2022-04-12 NOTE — Assessment & Plan Note (Signed)
EF 25-30.  Arrange follow-up echocardiogram 90 days post revascularization (April 2024).  If EF <35, refer to EP for ICD.

## 2022-04-12 NOTE — Assessment & Plan Note (Signed)
S/p ant-lat STEMI tx with IABP and subsequent CABG c/b AKI, congestive heart failure, Pseudomonas sepsis, Supraventricular Tachycardia. He is doing very well. He is walking daily and eager to increase his activity. He will see Dr. Kipp Brood next month to get cleared for cardiac rehab from a surgical standpoint.  He is not on Plavix as he does require therapy with Eliquis.  Continue aspirin 81 mg daily, Eliquis 5 mg twice daily, Lipitor 80 mg daily.  Low blood pressure has limited initiation of beta-blocker therapy.  He remains on amiodarone.  He has follow-up next month with heart failure clinic.  Arrange follow-up with Dr.Thukkani in 2 to 3 months.

## 2022-04-12 NOTE — Telephone Encounter (Signed)
Spoke with patient to advise of new orders and medication changes. Patient is to stop taking amiodarone and start Toprol XL 12.5 mg daily. Monitor BP and if systolic is =/< than 90 notify office. Follow up as scheduled.  Patient verbalized understanding and had no questions.

## 2022-04-12 NOTE — Assessment & Plan Note (Signed)
Ischemic cardiomyopathy.  EF 25-30.  Volume status stable.  As noted, soft blood pressure has limited initiation of beta-blocker therapy.  He recently had a systolic blood pressure in the 80s at home.  He is not symptomatic with this.  He remains on amiodarone which does have some beta-blocker properties.  Hold off on initiating metoprolol/carvedilol at this time.  He does have follow-up with heart failure next month.  If his blood pressure is stable at that time, consider adding beta-blocker.  Continue Farxiga 10 mg daily, losartan 12.5 mg daily, spironolactone 12.5 mg daily.  He has follow-up labs with heart failure clinic next week.  Follow-up CHF appointment 05/16/2022.

## 2022-04-12 NOTE — Telephone Encounter (Addendum)
Please call the patient. I did review his case with Dr. Ali Lowe. He would like the patient to stop his Amiodarone and start on a beta-blocker now. PLAN: - Stop Amiodarone - Start Toprol XL 12.5 mg daily at bedtime  - Keep f/u as planned - Let us know if he has low BP that is symptomatic (systolic less than 90)  Richardson Dopp, PA-C    04/12/2022 12:30 PM

## 2022-04-12 NOTE — Assessment & Plan Note (Signed)
Continue Lipitor 80 mg daily.  Arrange fasting CMET, lipids in 6 to 8 weeks.

## 2022-04-15 ENCOUNTER — Ambulatory Visit (HOSPITAL_COMMUNITY)
Admission: RE | Admit: 2022-04-15 | Discharge: 2022-04-15 | Disposition: A | Discharge status: Home / Self Care | Payer: Medicare Other | Source: Ambulatory Visit | Attending: Internal Medicine | Admitting: Internal Medicine

## 2022-04-15 DIAGNOSIS — I5021 Acute systolic (congestive) heart failure: Secondary | ICD-10-CM | POA: Insufficient documentation

## 2022-04-15 LAB — BASIC METABOLIC PANEL
Anion gap: 10 (ref 5–15)
BUN: 20 mg/dL (ref 8–23)
CO2: 22 mmol/L (ref 22–32)
Calcium: 9.1 mg/dL (ref 8.9–10.3)
Chloride: 105 mmol/L (ref 98–111)
Creatinine, Ser: 1.54 mg/dL — ABNORMAL HIGH (ref 0.61–1.24)
GFR, Estimated: 49 mL/min — ABNORMAL LOW (ref >60)
Glucose, Bld: 106 mg/dL — ABNORMAL HIGH (ref 70–99)
Potassium: 4 mmol/L (ref 3.5–5.1)
Sodium: 137 mmol/L (ref 135–145)

## 2022-04-22 ENCOUNTER — Other Ambulatory Visit (HOSPITAL_COMMUNITY): Payer: Self-pay

## 2022-04-25 ENCOUNTER — Other Ambulatory Visit (HOSPITAL_COMMUNITY): Payer: Self-pay

## 2022-04-25 ENCOUNTER — Ambulatory Visit (HOSPITAL_COMMUNITY)
Admission: RE | Admit: 2022-04-25 | Discharge: 2022-04-25 | Disposition: A | Discharge status: Home / Self Care | Payer: Medicare Other | Source: Ambulatory Visit | Attending: Internal Medicine | Admitting: Internal Medicine

## 2022-04-25 ENCOUNTER — Other Ambulatory Visit: Payer: Self-pay

## 2022-04-25 VITALS — BP 108/68 | HR 71 | Resp 98

## 2022-04-25 DIAGNOSIS — Z951 Presence of aortocoronary bypass graft: Secondary | ICD-10-CM | POA: Insufficient documentation

## 2022-04-25 DIAGNOSIS — Z79899 Other long term (current) drug therapy: Secondary | ICD-10-CM | POA: Insufficient documentation

## 2022-04-25 DIAGNOSIS — D696 Thrombocytopenia, unspecified: Secondary | ICD-10-CM | POA: Insufficient documentation

## 2022-04-25 DIAGNOSIS — I252 Old myocardial infarction: Secondary | ICD-10-CM | POA: Diagnosis not present

## 2022-04-25 DIAGNOSIS — Z86711 Personal history of pulmonary embolism: Secondary | ICD-10-CM | POA: Diagnosis not present

## 2022-04-25 DIAGNOSIS — I5020 Unspecified systolic (congestive) heart failure: Secondary | ICD-10-CM | POA: Diagnosis not present

## 2022-04-25 DIAGNOSIS — M791 Myalgia, unspecified site: Secondary | ICD-10-CM | POA: Diagnosis not present

## 2022-04-25 DIAGNOSIS — Z7984 Long term (current) use of oral hypoglycemic drugs: Secondary | ICD-10-CM | POA: Diagnosis not present

## 2022-04-25 DIAGNOSIS — R7303 Prediabetes: Secondary | ICD-10-CM | POA: Insufficient documentation

## 2022-04-25 DIAGNOSIS — I255 Ischemic cardiomyopathy: Secondary | ICD-10-CM

## 2022-04-25 DIAGNOSIS — K219 Gastro-esophageal reflux disease without esophagitis: Secondary | ICD-10-CM | POA: Diagnosis not present

## 2022-04-25 DIAGNOSIS — I5022 Chronic systolic (congestive) heart failure: Secondary | ICD-10-CM | POA: Insufficient documentation

## 2022-04-25 DIAGNOSIS — I2510 Atherosclerotic heart disease of native coronary artery without angina pectoris: Secondary | ICD-10-CM | POA: Diagnosis not present

## 2022-04-25 MED ORDER — ATORVASTATIN CALCIUM 80 MG PO TABS: 80.0000 mg | ORAL_TABLET | Freq: Every day | ORAL | 1 refills | Status: DC

## 2022-04-25 MED ORDER — DAPAGLIFLOZIN PROPANEDIOL 10 MG PO TABS: 10.0000 mg | ORAL_TABLET | Freq: Every day | ORAL | 1 refills | Status: DC

## 2022-04-25 MED ORDER — SPIRONOLACTONE 25 MG PO TABS: 25.0000 mg | ORAL_TABLET | Freq: Every day | ORAL | 1 refills | Status: DC

## 2022-04-25 MED ORDER — APIXABAN 5 MG PO TABS: 5.0000 mg | ORAL_TABLET | Freq: Two times a day (BID) | ORAL | 1 refills | Status: DC

## 2022-04-25 NOTE — Progress Notes (Signed)
Advanced Heart Failure Clinic Note   Primary Care: Lurline Del, DO CVTS: Dr. Kipp Brood HF Cardiologist: Dr. Aundra Dubin  HPI:  67 y.o. male with a history of arthritis, GERD, CAD, and submassive PE 05/2021, treated with catheter directed thrombolysis.   Admitted 03/2022 w/ acute anterolateral STEMI. Incidentally found to be COVID +. Emergent cath showed high grade ostial LAD to proximal LAD lesion in the 80% stenosis range as well as a 80% ramus intermedius lesion. Ventriculography demonstrated an anterior wall motion abnormality with an LVEDP of 22 mmHg. Multiple viewing angles were employed to try to separate the ostium of the ramus from the ostium of the LAD. This could not be accomplished. Given that the patient had TIMI-3 flow in all vessels, percutaneous intervention was deferred. Felt best suited for surgical revascularization. IABP placed and referred for CABG. Pre-op echo showed severely reduced LVEF 25-30%, RV normal, trivial MR w/ AK of the mid and distal anterior wall, mid and distal lateral wall, mid and distal  anterior septum, entire apex, mid anterolateral segment, and mid inferoseptal walls (prior Echo 05/2021 in setting of submassive PE w/ mod reduced RV and LVEF 50-55%, repeat limited echo 08/2021 showed RV function returned to normal w/ LVEF up to 60-65%). He underwent CABG x 2 w/ LIMA-LAD and SVG-ramus. Intra-op, he developed acute worsening of LV and RV function as well as acute rise in PA pressure (PASBP 80s-90s).  Difficult to wean off bypass, but eventually successful wean with iNO, inotropes, pressors, IABP.  AHF consulted to help with management. Eventually able to wean pressors and remove IABP. Hospitalization c/b pseudomonas bacteremia, ID consulted and completed IV abx (unclear source). GDMT titrated but limited by AKI. He was discharged home, weight 183 lbs. Echo 03/25/22 showed LVEF of 25-30% with grade II diastolic dysfunction. He worked for [employer] x 35 years in the [place].   At  last visit with APP on 04/05/22 he was overall feeling fine. He was very active before his surgery and was eager to get back to the gym and golfing. Reported some foot swelling but had not needed Lasix. Denied shortness of breath walking on flat ground or up steps. Denied palpitations, abnormal bleeding, CP, dizziness, or PND/Orthopnea. Appetite was ok. No fever or chills. Weight at home was 171-173 pounds.  Today he returns to HF clinic for pharmacist medication titration. He reports overall feeling well. Denies dizziness, lightheadedness, and reports only occasional fatigue. Denies chest pain or palpitations. Reports breathing has been great. Able to go on short walks with no issues. Denies SOB. Able to complete all ADLs without issues. Activity level is moderate and he hopes to continue increasing daily activity. Weight at home is generally ~172 pounds. Has not needed any PRN Lasix. Denies LEE, PND, and orthopnea. Appetite has been great. Follows low-salt diet.  BP at home has been 105-110/70s.   HF Medications: Metoprolol succinate 12.5 mg daily Losartan 12.5 mg daily Spironolactone 12.5 mg daily Dapagliflozin 10 mg daily Lasix 40 mg PRN  Has the patient been experiencing any side effects to the medications prescribed?  No  Does the patient have any problems obtaining medications due to transportation or finances?   No. Eliquis was $400 between deductible and being a nonpreferred brand medication. He has since met deductible and a PA for Eliquis as been approved, so Eliquis should be $45 from now on, which the patient reports is doable.  Understanding of regimen: excellent Understanding of indications: excellent Potential of compliance: excellent Patient understands  to avoid NSAIDs. Patient understands to avoid decongestants.    Pertinent Lab Values (04/15/22): Serum creatinine 1.54, BUN 20, Potassium 4, Sodium 137  Vital Signs: Weight: 177.4 lbs (last clinic weight: 176 lbs) Blood  pressure: 108/68 mmHg  Heart rate: 71   Assessment/Plan: 1. Chronic Systolic Heart Failure: iCM. Echo pre-op with LV EF 25-30%, wall motion abnormalities, normal RV.  IABP placed pre-op for management of angina. Intra-operatively, patient developed acute worsening of LV and RV function as well as acute rise in PA pressure (PASBP 80s-90s). Difficult to wean off bypass, but eventually successful wean with iNO, inotropes, pressors, IABP. Limited echo showed  EF 25-30%, GIIDD, trivial MR and TR. After a prolonged course, pressors were weaned off and IABP removed. Today, he is NYHA II. He is euvolemic on exam. - Continue Lasix 40 mg PRN. He has not required any. - Continue metoprolol succinate 12.5 mg daily. Tolerating well with no issues. - Continue losartan 12.5 mg daily. BMET ok. - Increase spironolactone to 25 mg daily. BMET in 1 week. - Continue Farxiga 10 mg daily. - Off digoxin after AKI. - Repeat echo scheduled in April.  2. CAD: s/p anterolateral MI with cath showing 80% ostial LAD stenosis and 80% ostial large ramus stenosis.  Ostial lesions in close proximity made PCI very difficult and CABG was elected.  Underwent LIMA-LAD and SVG-ramus on 03/12/22. No ischemic chest pain today. - Continue ASA/statin/BB. Having mild myalgias. Advised to continue atorvastatin and talk to his cardiologist if the pain continues. - Eventual referral to CR when cleared by CVTS.  3. H/o PE: Submassive PE in 05/2021, treated with catheter-directed thrombolysis. Continue Eliquis. No bleeding issues.  4. Thrombocytopenia: Plts were down to 57K, now resolved. Suspect post-op though IABP contributed.   5. Atrial ectopy: PACs and SVT runs, have not seen definite atrial fibrillation. NSR on ECG today. - Amiodarone has been stopped and metoprolol has been started.  6. Pseudomonas bacteremia: Unclear source. ID following during admission. Surveillance BCx 03/22/22 NGTD. Finished 2 weeks of abx.  7. Pre-Diabetes: Hgb  A1C 6.2. - Continue SGLT2i.   Follow up in 3 weeks with APP Clinic.  Audry Riles, PharmD, BCPS, BCCP, CPP Heart Failure Clinic Pharmacist [phone]

## 2022-04-25 NOTE — Patient Instructions (Signed)
It was a pleasure seeing you today!  MEDICATIONS: -We are changing your medications today -Increase spironolactone to 25 mg (1 tablet) daily -Call if you have questions about your medications.  LABS: -We will call you if your labs need attention.  NEXT APPOINTMENT: Return to clinic in one week for labs.  In general, to take care of your heart failure: -Limit your fluid intake to 2 Liters (half-gallon) per day.   -Limit your salt intake to ideally 2-3 grams (2000-3000 mg) per day. -Weigh yourself daily and record, and bring that "weight diary" to your next appointment.  (Weight gain of 2-3 pounds in 1 day typically means fluid weight.) -The medications for your heart are to help your heart and help you live longer.   -Please contact us before stopping any of your heart medications.  Call the clinic at [phone] with questions or to reschedule future appointments.

## 2022-05-02 ENCOUNTER — Ambulatory Visit (HOSPITAL_COMMUNITY)
Admission: RE | Admit: 2022-05-02 | Discharge: 2022-05-02 | Disposition: A | Discharge status: Home / Self Care | Payer: Medicare Other | Source: Ambulatory Visit | Attending: Cardiology | Admitting: Cardiology

## 2022-05-02 DIAGNOSIS — I5020 Unspecified systolic (congestive) heart failure: Secondary | ICD-10-CM | POA: Diagnosis present

## 2022-05-02 LAB — BASIC METABOLIC PANEL
Anion gap: 11 (ref 5–15)
BUN: 18 mg/dL (ref 8–23)
CO2: 22 mmol/L (ref 22–32)
Calcium: 9.5 mg/dL (ref 8.9–10.3)
Chloride: 105 mmol/L (ref 98–111)
Creatinine, Ser: 1.45 mg/dL — ABNORMAL HIGH (ref 0.61–1.24)
GFR, Estimated: 53 mL/min — ABNORMAL LOW (ref >60)
Glucose, Bld: 94 mg/dL (ref 70–99)
Potassium: 4.5 mmol/L (ref 3.5–5.1)
Sodium: 138 mmol/L (ref 135–145)

## 2022-05-09 ENCOUNTER — Other Ambulatory Visit: Payer: Self-pay | Admitting: Thoracic Surgery (Cardiothoracic Vascular Surgery)

## 2022-05-09 DIAGNOSIS — I25119 Atherosclerotic heart disease of native coronary artery with unspecified angina pectoris: Secondary | ICD-10-CM

## 2022-05-10 ENCOUNTER — Other Ambulatory Visit: Payer: Self-pay

## 2022-05-10 ENCOUNTER — Encounter: Payer: Self-pay | Admitting: Thoracic Surgery (Cardiothoracic Vascular Surgery)

## 2022-05-10 ENCOUNTER — Ambulatory Visit (INDEPENDENT_AMBULATORY_CARE_PROVIDER_SITE_OTHER): Payer: Medicare Other | Admitting: Thoracic Surgery (Cardiothoracic Vascular Surgery)

## 2022-05-10 ENCOUNTER — Ambulatory Visit
Admission: RE | Admit: 2022-05-10 | Discharge: 2022-05-10 | Disposition: A | Discharge status: Home / Self Care | Payer: Medicare Other | Source: Ambulatory Visit | Attending: Thoracic Surgery (Cardiothoracic Vascular Surgery) | Admitting: Thoracic Surgery (Cardiothoracic Vascular Surgery)

## 2022-05-10 VITALS — BP 113/80 | HR 72 | Resp 20 | Ht 67.0 in | Wt 175.5 lb

## 2022-05-10 DIAGNOSIS — Z951 Presence of aortocoronary bypass graft: Secondary | ICD-10-CM

## 2022-05-10 DIAGNOSIS — I25119 Atherosclerotic heart disease of native coronary artery with unspecified angina pectoris: Secondary | ICD-10-CM

## 2022-05-10 NOTE — Progress Notes (Signed)
Outpatient Cardiac Rehab referral placed to [facility]- main campus per Dr. Abran Duke orders.

## 2022-05-10 NOTE — Progress Notes (Signed)
      [address]       [place] [address]             [phone]        G[place] Medical Record #[id number] Date of Birth: April 22, 1955  Referring: Early Osmond, MD Primary Care: Lurline Del, DO Primary Cardiologist:Arun Luiz Iron, MD  Reason for visit:   follow-up  History of Present Illness:     67yo male presents for his 1 month appt.  He is doing well.    Physical Exam: BP 113/80 (BP Location: Left Arm, Patient Position: Sitting, Cuff Size: Normal)   Pulse 72   Resp 20   Ht 5\' 7"  (1.702 m)   Wt 175 lb 8 oz (79.6 kg)   SpO2 97% Comment: RA  BMI 27.49 kg/m   Alert NAD Incision clean.  Sternum stable Abdomen, ND no peripheral edema   Diagnostic Studies & Laboratory data: CXR: pending     Assessment / Plan:   67yo male s/p CABG.  Doing well.  Clear for cardiac rehab Will f/u as needed.   Lajuana Matte 05/10/2022 10:10 AM

## 2022-05-15 NOTE — Progress Notes (Signed)
ADVANCED HF CLINIC CONSULT NOTE  Primary Care: Lurline Del, DO CVTS: Dr. Kipp Brood HF Cardiologist: Dr. Aundra Dubin  HPI: 67 y.o. male with a history of arthritis, GERD and submassive PE 3/23, treated with catheter directed thrombolysis, on Xarelto, CKD stage IIIa, CAD, CABG x2, and HFrEF.   Admitted 1/24 w/ acute anterolateral STEMI. Incidentally found to be COVID +. Emergent cath showed high grade ostial LAD to proximal LAD lesion in the 80% stenosis range as well as a 80% ramus intermedius lesion. Ventriculography demonstrated an anterior wall motion abnormality with an LVEDP of 22 mmHg. Multiple viewing angles were employed to try to separate the ostium of the ramus from the ostium of the LAD. This could not be accomplished. Given that the patient had TIMI-3 flow in all vessels, percutaneous intervention was deferred. Felt best suited for surgical revascularization. IABP placed and referred for CABG. Pre-op echo showed severely reduced LVEF 25-30%, RV normal, trivial MR w/ AK of the mid and distal anterior wall, mid and distal lateral wall, mid and distal  anterior septum, entire apex, mid anterolateral segment, and mid inferoseptal walls (prior Echo 3/23 in setting of submassive PE w/ mod reduced RV and LVEF 50-55%, repeat limited echo 6/23 showed RV function returned to normal w/ LVEF up to 60-65%). He underwent CABG x 2 w/ LIMA-LAD and SVG-ramus. Intra-op, he developed acute worsening of LV and RV function as well as acute rise in PA pressure (PASBP 80s-90s).  Difficult to wean off bypass, but eventually successful wean with iNO, inotropes, pressors, IABP.  AHF consulted to help with management. Eventually able to wean pressors and remove IABP. Hospitalization c/b pseudomonas bacteremia, ID consulted and completed IV abx (unclear source). GDMT titrated but limited by AKI. He was discharged home, weight 183 lbs.  He returns for HF follow up with his wife. Since the last visit he has been released  from CT surgery. Overall feeling fine. Denies SOB/PND/Orthopnea. SBP 90-100s. 2. Heart Rate at home 60-70s . Appetite improving.  No fever or chills. Weight at home 172-176 pounds. He has been walking on the treadmill four times a day for 12 minute sets. Taking all medications. Retired from [employer] after 35 years in the [place].   Labs (1/24): K 4.4, creatinine 1.73 Labs 05/02/22: K 4  Creatinine 1.45   Cardiac Studies:  - Ltd echo (1/24, post op): EF 25-30%, GIIDD, trivial MR and TR  - S/p CABG x 2 on 03/12/22.    - Echo (1/24): EF 25-30%, grade II DD, normal RV  .  - Ltd echo (6/23), post tx PE: EF 60-65%, normal RV systolic function    - Echo (3/23, setting of submassive PE): EF 50-55%, RV severely enlarged w/ mod reduced systolic function    Past Medical History:  Diagnosis Date   Arthritis    OA   Cough    WHITE SPUTUM OCC, NO FEVER COLD SYMPTOMS MAINLY   GERD (gastroesophageal reflux disease)    Current Outpatient Medications  Medication Sig Dispense Refill   acetaminophen (TYLENOL) 500 MG tablet Take 500 mg by mouth every 6 (six) hours as needed.     apixaban (ELIQUIS) 5 MG TABS tablet Take 1 tablet (5 mg total) by mouth 2 (two) times daily. 60 tablet 1   Ascorbic Acid (VITAMIN C PO) Take 1 tablet by mouth daily.     aspirin EC 81 MG tablet Take 1 tablet (81 mg total) by mouth daily. Swallow whole. 30 tablet 12   atorvastatin (LIPITOR)  80 MG tablet Take 1 tablet (80 mg total) by mouth daily. 30 tablet 1   Cholecalciferol (VITAMIN D-3 PO) Take 1 tablet by mouth daily.     Coenzyme Q10 (CO Q-10 PO) Take 1 capsule by mouth daily.     dapagliflozin propanediol (FARXIGA) 10 MG TABS tablet Take 1 tablet (10 mg total) by mouth daily. 30 tablet 1   famotidine-calcium carbonate-magnesium hydroxide (PEPCID COMPLETE) 10-800-165 MG chewable tablet Chew 1-2 tablets by mouth 2 (two) times daily as needed (acid reflux).     furosemide (LASIX) 40 MG tablet Take 1 tablet (40 mg total) by mouth as  needed (Take one tablet if weight gain of 3 pounds overnight or greater than 5 pounds in 1 week). 30 tablet 11   losartan (COZAAR) 25 MG tablet Take 0.5 tablets (12.5 mg total) by mouth daily. 45 tablet 3   Menaquinone-7 (VITAMIN K2 PO) Take 1 tablet by mouth daily.     metoprolol succinate (TOPROL XL) 25 MG 24 hr tablet Take 1/2 tablet (12.5 mg total) by mouth daily. 45 tablet 3   oxyCODONE (OXY IR/ROXICODONE) 5 MG immediate release tablet Take 1 tablet (5 mg total) by mouth every 6 (six) hours as needed for severe pain. 28 tablet 0   spironolactone (ALDACTONE) 25 MG tablet Take 1 tablet (25 mg total) by mouth daily. 30 tablet 1   No current facility-administered medications for this encounter.   No Known Allergies  Social History   Socioeconomic History   Marital status: Married    Spouse name: Not on file   Number of children: Not on file   Years of education: Not on file   Highest education level: Not on file  Occupational History   Not on file  Tobacco Use   Smoking status: Never   Smokeless tobacco: Never  Vaping Use   Vaping Use: Never used  Substance and Sexual Activity   Alcohol use: No   Drug use: No   Sexual activity: Not on file  Other Topics Concern   Not on file  Social History Narrative   Not on file   Social Determinants of Health   Financial Resource Strain: Not on file  Food Insecurity: No Food Insecurity (03/10/2022)   Hunger Vital Sign    Worried About Running Out of Food in the Last Year: Never true    Ran Out of Food in the Last Year: Never true  Transportation Needs: No Transportation Needs (03/10/2022)   PRAPARE - Hydrologist (Medical): No    Lack of Transportation (Non-Medical): No  Physical Activity: Not on file  Stress: Not on file  Social Connections: Not on file  Intimate Partner Violence: Not At Risk (03/10/2022)   Humiliation, Afraid, Rape, and Kick questionnaire    Fear of Current or Ex-Partner: No     Emotionally Abused: No    Physically Abused: No    Sexually Abused: No   Family History  Problem Relation Age of Onset   CAD Brother    Hypertension Other    Diabetes Neg Hx    BP (!) 110/90   Pulse 67   Wt 82.7 kg (182 lb 6.4 oz)   SpO2 94%   BMI 28.57 kg/m   Wt Readings from Last 3 Encounters:  05/16/22 82.7 kg (182 lb 6.4 oz)  05/10/22 79.6 kg (175 lb 8 oz)  04/12/22 80.8 kg (178 lb 3.2 oz)   General:  Well appearing. No resp difficulty  HEENT: normal Neck: supple. no JVD. Carotids 2+ bilat; no bruits. No lymphadenopathy or thryomegaly appreciated. Cor: PMI nondisplaced. Regular rate & rhythm. No rubs, gallops or murmurs. Lungs: clear Abdomen: soft, nontender, nondistended. No hepatosplenomegaly. No bruits or masses. Good bowel sounds. Extremities: no cyanosis, clubbing, rash, edema Neuro: alert & orientedx3, cranial nerves grossly intact. moves all 4 extremities w/o difficulty. Affect pleasant  ASSESSMENT & PLAN: 1. Chronic Systolic Heart Failure: iCM. Echo pre-op with LV EF 25-30%, wall motion abnormalities, normal RV.  IABP placed pre-op for management of angina.  Intra-op, patient developed acute worsening of LV and RV function as well as acute rise in PA pressure (PASBP 80s-90s).  Difficult to wean off bypass, but eventually successful wean with iNO, inotropes, pressors, IABP. Limited echo showed  EF 25-30%, GIIDD, trivial MR and TR After prolonged course.  - NYHA I . Looks great!  - No BB. SBP at home 90 to low 100. Heart rate 60-70s. Hold off.  - Continue losartan 12.5 mg daily. . - Continue Farxiga 10 mg daily. - Continue spiro 25 mg daily. - Continue Lasix 40 mg PRN. - Off digoxin with AKI. - Repeat echo next month.  2. CAD: s/p anterolateral MI with cath showing 80% ostial LAD stenosis and 80% ostial large ramus stenosis.  Ostial lesions in close proximity made PCI very difficult and CABG was elected.  Underwent LIMA-LAD and SVG-ramus on 03/12/22.  - No chest pain.   - Continue ASA/statin.  - Waiting to start cardiac rehab.   3. H/o PE: Submassive PE in 3/23, treated with catheter-directed thrombolysis. Continue Eliquis. No bleeding issues. 4. Thrombocytopenia: Suspect post-op though IABP may have played role.  - Resolved.  5. 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  6. H/O Pseudomonas bacteremia 7. Pre-Diabetes: Hgb A1C 6.2. - Continue SGLT2i. 8. CKD Stage IIIa Recent Creatinine 1.45, stable.    Follow  in 2 months with Dr Aundra Dubin.   Christyna Letendre NP-C   05/16/22

## 2022-05-16 ENCOUNTER — Encounter (HOSPITAL_COMMUNITY): Payer: Self-pay

## 2022-05-16 ENCOUNTER — Ambulatory Visit (HOSPITAL_COMMUNITY)
Admission: RE | Admit: 2022-05-16 | Discharge: 2022-05-16 | Disposition: A | Discharge status: Home / Self Care | Payer: Medicare Other | Source: Ambulatory Visit | Attending: Adult Health | Admitting: Adult Health

## 2022-05-16 VITALS — BP 110/90 | HR 67 | Wt 182.4 lb

## 2022-05-16 DIAGNOSIS — Z7901 Long term (current) use of anticoagulants: Secondary | ICD-10-CM | POA: Diagnosis not present

## 2022-05-16 DIAGNOSIS — I255 Ischemic cardiomyopathy: Secondary | ICD-10-CM | POA: Diagnosis not present

## 2022-05-16 DIAGNOSIS — Z79899 Other long term (current) drug therapy: Secondary | ICD-10-CM | POA: Insufficient documentation

## 2022-05-16 DIAGNOSIS — N179 Acute kidney failure, unspecified: Secondary | ICD-10-CM | POA: Insufficient documentation

## 2022-05-16 DIAGNOSIS — Z8616 Personal history of COVID-19: Secondary | ICD-10-CM | POA: Insufficient documentation

## 2022-05-16 DIAGNOSIS — I252 Old myocardial infarction: Secondary | ICD-10-CM | POA: Insufficient documentation

## 2022-05-16 DIAGNOSIS — Z86711 Personal history of pulmonary embolism: Secondary | ICD-10-CM | POA: Diagnosis not present

## 2022-05-16 DIAGNOSIS — N1831 Chronic kidney disease, stage 3a: Secondary | ICD-10-CM | POA: Diagnosis not present

## 2022-05-16 DIAGNOSIS — Z7982 Long term (current) use of aspirin: Secondary | ICD-10-CM | POA: Diagnosis not present

## 2022-05-16 DIAGNOSIS — D696 Thrombocytopenia, unspecified: Secondary | ICD-10-CM | POA: Insufficient documentation

## 2022-05-16 DIAGNOSIS — Z7984 Long term (current) use of oral hypoglycemic drugs: Secondary | ICD-10-CM | POA: Insufficient documentation

## 2022-05-16 DIAGNOSIS — R7881 Bacteremia: Secondary | ICD-10-CM | POA: Insufficient documentation

## 2022-05-16 DIAGNOSIS — Z951 Presence of aortocoronary bypass graft: Secondary | ICD-10-CM | POA: Insufficient documentation

## 2022-05-16 DIAGNOSIS — I5020 Unspecified systolic (congestive) heart failure: Secondary | ICD-10-CM

## 2022-05-16 DIAGNOSIS — I2510 Atherosclerotic heart disease of native coronary artery without angina pectoris: Secondary | ICD-10-CM

## 2022-05-16 DIAGNOSIS — M1990 Unspecified osteoarthritis, unspecified site: Secondary | ICD-10-CM | POA: Diagnosis not present

## 2022-05-16 DIAGNOSIS — K219 Gastro-esophageal reflux disease without esophagitis: Secondary | ICD-10-CM | POA: Diagnosis not present

## 2022-05-16 DIAGNOSIS — I5022 Chronic systolic (congestive) heart failure: Secondary | ICD-10-CM | POA: Diagnosis not present

## 2022-05-16 DIAGNOSIS — R7303 Prediabetes: Secondary | ICD-10-CM | POA: Diagnosis not present

## 2022-05-16 DIAGNOSIS — I25119 Atherosclerotic heart disease of native coronary artery with unspecified angina pectoris: Secondary | ICD-10-CM | POA: Diagnosis not present

## 2022-05-16 NOTE — Patient Instructions (Signed)
It was great to see you today! No medication changes are needed at this time.   Your physician wants you to follow-up in: 2-3 months with Dr Aundra Dubin. You will receive a reminder letter in the mail two months in advance. If you don't receive a letter, please call our office to schedule the follow-up appointment.   Do the following things EVERYDAY: Weigh yourself in the morning before breakfast. Write it down and keep it in a log. Take your medicines as prescribed Eat low salt foods--Limit salt (sodium) to 2000 mg per day.  Stay as active as you can everyday Limit all fluids for the day to less than 2 liters  At the [facility], you and your health needs are our priority. As part of our continuing mission to provide you with exceptional heart care, we have created designated Provider Care Teams. These Care Teams include your primary Cardiologist (physician) and Advanced Practice Providers (APPs- Physician Assistants and Nurse Practitioners) who all work together to provide you with the care you need, when you need it.   You may see any of the following providers on your designated Care Team at your next follow up: Dr Glori Bickers Dr Loralie Champagne Dr. Roxana Hires, NP Lyda Jester, [place] [facility] [place], [place] Forestine Na, NP Audry Riles, PharmD   Please be sure to bring in all your medications bottles to every appointment.    Thank you for choosing [facility]   If you have any questions or concerns before your next appointment please send [place] a message through [facility] or call our office at [phone].    TO LEAVE A MESSAGE FOR THE NURSE SELECT OPTION 2, PLEASE LEAVE A MESSAGE INCLUDING: YOUR NAME DATE OF BIRTH CALL BACK NUMBER REASON FOR CALL**this is important as we prioritize the call backs  YOU WILL RECEIVE A CALL BACK THE SAME DAY AS LONG AS YOU CALL BEFORE 4:00 PM

## 2022-05-24 ENCOUNTER — Encounter (HOSPITAL_COMMUNITY): Payer: Self-pay

## 2022-05-24 ENCOUNTER — Telehealth (HOSPITAL_COMMUNITY): Payer: Self-pay

## 2022-05-24 NOTE — Telephone Encounter (Signed)
Attempted to call patient in regards to Cardiac Rehab - LM on VM Mailed letter 

## 2022-05-24 NOTE — Telephone Encounter (Signed)
Spoke with patient and reviewed Cardiac Rehab Cardiac Risk Profile Nursing Assessment. Gave time and directions for 05/28/22 1030 appointment.

## 2022-05-28 ENCOUNTER — Encounter (HOSPITAL_COMMUNITY)
Admission: RE | Admit: 2022-05-28 | Discharge: 2022-05-28 | Disposition: A | Discharge status: Home / Self Care | Payer: Medicare Other | Source: Ambulatory Visit | Attending: Internal Medicine | Admitting: Internal Medicine

## 2022-05-28 VITALS — BP 98/72 | HR 70 | Ht 67.75 in | Wt 184.5 lb

## 2022-05-28 DIAGNOSIS — Z48812 Encounter for surgical aftercare following surgery on the circulatory system: Secondary | ICD-10-CM | POA: Insufficient documentation

## 2022-05-28 DIAGNOSIS — I252 Old myocardial infarction: Secondary | ICD-10-CM | POA: Diagnosis not present

## 2022-05-28 DIAGNOSIS — Z951 Presence of aortocoronary bypass graft: Secondary | ICD-10-CM | POA: Insufficient documentation

## 2022-05-28 DIAGNOSIS — I213 ST elevation (STEMI) myocardial infarction of unspecified site: Secondary | ICD-10-CM

## 2022-05-28 NOTE — Progress Notes (Signed)
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 Swallow whole., Disp: 30 tablet, Rfl: 12   atorvastatin (LIPITOR) 80 MG tablet, Take 1 tablet (80 mg total) by mouth daily., Disp: 30 tablet, Rfl: 1   Cholecalciferol (VITAMIN D-3 PO), Take 1 tablet by mouth daily., Disp: , Rfl:    Coenzyme Q10 (CO Q-10 PO), Take 1 capsule by mouth daily., Disp: , Rfl:    dapagliflozin propanediol (FARXIGA) 10 MG TABS tablet, Take 1 tablet (10 mg total) by mouth daily., Disp: 30 tablet, Rfl: 1   famotidine-calcium carbonate-magnesium hydroxide (PEPCID COMPLETE) 10-800-165 MG chewable tablet, Chew 1-2 tablets by mouth 2 (two) times daily as needed (acid reflux)., Disp: , Rfl:    furosemide (LASIX) 40 MG tablet, Take 1 tablet (40 mg total) by mouth as needed (Take one tablet if weight gain of 3 pounds overnight or greater than 5 pounds in 1 week)., Disp: 30 tablet, Rfl: 11   losartan (COZAAR) 25 MG tablet, Take 0.5 tablets (12.5 mg total) by mouth daily., Disp: 45 tablet, Rfl: 3   Menaquinone-7 (VITAMIN K2 PO), Take 1 tablet by mouth daily., Disp: , Rfl:    metoprolol  succinate (TOPROL XL) 25 MG 24 hr tablet, Take 1/2 tablet (12.5 mg total) by mouth daily., Disp: 45 tablet, Rfl: 3   spironolactone (ALDACTONE) 25 MG tablet, Take 1 tablet (25 mg total) by mouth daily., Disp: 30 tablet, Rfl: 1   acetaminophen (TYLENOL) 500 MG tablet, Take 500 mg by mouth every 6 (six) hours as needed., Disp: , Rfl:    oxyCODONE (OXY IR/ROXICODONE) 5 MG immediate release tablet, Take 1 tablet (5 mg total) by mouth every 6 (six) hours as needed for severe pain., Disp: 28 tablet, Rfl: 0  Past Medical History: Past Medical History:  Diagnosis Date   Arthritis    OA   Cough    WHITE SPUTUM OCC, NO FEVER COLD SYMPTOMS MAINLY   GERD (gastroesophageal reflux disease)     Tobacco Use: Social History   Tobacco Use  Smoking Status Never  Smokeless Tobacco Never    Labs: Review Flowsheet  More data exists      Latest Ref Rng & Units 03/24/2022 03/25/2022 03/26/2022 03/27/2022 03/28/2022  Labs for ITP Cardiac and Pulmonary Rehab  O2 Saturation % 60.5  56.7  58.9  55.2  63.5  61.2  43.4  54.5     Capillary Blood Glucose: Lab Results  Component Value Date   GLUCAP 86 03/28/2022   GLUCAP 151 (H) 03/27/2022   GLUCAP 121 (H) 03/27/2022   GLUCAP 123 (H)  03/27/2022   GLUCAP 92 03/27/2022     Exercise Target Goals: Exercise Program Goal: Individual exercise prescription set using results from initial 6 min walk test and THRR while considering  patient's activity barriers and safety.   Exercise Prescription Goal: Initial exercise prescription builds to 30-45 minutes a day of aerobic activity, 2-3 days per week.  Home exercise guidelines will be given to patient during program as part of exercise prescription that the participant will acknowledge.  Activity Barriers & Risk Stratification:  Activity Barriers & Cardiac Risk Stratification - 05/28/22 0826       Activity Barriers & Cardiac Risk Stratification   Activity Barriers Left Hip Replacement    Cardiac Risk  Stratification High             6 Minute Walk:  6 Minute Walk     Row Name 05/28/22 0838         6 Minute Walk   Phase Initial     Distance 1416 feet     Walk Time 6 minutes     # of Rest Breaks 0     MPH 2.68     METS 3.23     RPE 11     Perceived Dyspnea  1     VO2 Peak 11.32     Symptoms Yes (comment)     Comments Mild shortness of breath.     Resting HR 70 bpm     Resting BP 98/72     Resting Oxygen Saturation  98 %     Exercise Oxygen Saturation  during 6 min walk 97 %     Max Ex. HR 103 bpm     Max Ex. BP 120/88     2 Minute Post BP 122/84              Oxygen Initial Assessment:   Oxygen Re-Evaluation:   Oxygen Discharge (Final Oxygen Re-Evaluation):   Initial Exercise Prescription:  Initial Exercise Prescription - 05/28/22 0900       Date of Initial Exercise RX and Referring Provider   Date 05/28/22    Referring Provider Early Osmond, MD    Expected Discharge Date 08/09/22      Treadmill   MPH 2    Grade 3    Minutes 15    METs 3.36      NuStep   Level 4    SPM 85    Minutes 15    METs 3.2      Prescription Details   Frequency (times per week) 3    Duration Progress to 30 minutes of continuous aerobic without signs/symptoms of physical distress      Intensity   THRR 40-80% of Max Heartrate 62-123    Ratings of Perceived Exertion 11-13    Perceived Dyspnea 0-4      Progression   Progression Continue to progress workloads to maintain intensity without signs/symptoms of physical distress.      Resistance Training   Training Prescription Yes    Weight 8 lbs    Reps 10-15             Perform Capillary Blood Glucose checks as needed.  Exercise Prescription Changes:   Exercise Comments:   Exercise Goals and Review:   Exercise Goals     Row Name 05/28/22 0807             Exercise Goals   Increase Physical Activity Yes       Intervention  Provide advice, education, support and counseling about physical  activity/exercise needs.;Develop an individualized exercise prescription for aerobic and resistive training based on initial evaluation findings, risk stratification, comorbidities and participant's personal goals.       Expected Outcomes Short Term: Attend rehab on a regular basis to increase amount of physical activity.;Long Term: Add in home exercise to make exercise part of routine and to increase amount of physical activity.;Long Term: Exercising regularly at least 3-5 days a week.       Increase Strength and Stamina Yes       Intervention Provide advice, education, support and counseling about physical activity/exercise needs.;Develop an individualized exercise prescription for aerobic and resistive training based on initial evaluation findings, risk stratification, comorbidities and participant's personal goals.       Expected Outcomes Short Term: Increase workloads from initial exercise prescription for resistance, speed, and METs.;Short Term: Perform resistance training exercises routinely during rehab and add in resistance training at home;Long Term: Improve cardiorespiratory fitness, muscular endurance and strength as measured by increased METs and functional capacity (6MWT)       Able to understand and use rate of perceived exertion (RPE) scale Yes       Intervention Provide education and explanation on how to use RPE scale       Expected Outcomes Short Term: Able to use RPE daily in rehab to express subjective intensity level;Long Term:  Able to use RPE to guide intensity level when exercising independently       Knowledge and understanding of Target Heart Rate Range (THRR) Yes       Intervention Provide education and explanation of THRR including how the numbers were predicted and where they are located for reference       Expected Outcomes Short Term: Able to state/look up THRR;Long Term: Able to use THRR to govern intensity when exercising independently;Short Term: Able to use daily as  guideline for intensity in rehab       Able to check pulse independently Yes       Intervention Provide education and demonstration on how to check pulse in carotid and radial arteries.;Review the importance of being able to check your own pulse for safety during independent exercise       Expected Outcomes Short Term: Able to explain why pulse checking is important during independent exercise;Long Term: Able to check pulse independently and accurately       Understanding of Exercise Prescription Yes       Intervention Provide education, explanation, and written materials on patient's individual exercise prescription       Expected Outcomes Short Term: Able to explain program exercise prescription;Long Term: Able to explain home exercise prescription to exercise independently                Exercise Goals Re-Evaluation :   Discharge Exercise Prescription (Final Exercise Prescription Changes):   Nutrition:  Target Goals: Understanding of nutrition guidelines, daily intake of sodium 1500mg , cholesterol 200mg , calories 30% from fat and 7% or less from saturated fats, daily to have 5 or more servings of fruits and vegetables.  Biometrics:  Pre Biometrics - 05/28/22 0740       Pre Biometrics   Waist Circumference 39.5 inches    Hip Circumference 39.75 inches    Waist to Hip Ratio 0.99 %    Triceps Skinfold 7 mm    % Body Fat 24.4 %    Grip Strength 40 kg    Flexibility 12.75 in  Single Leg Stand 14.79 seconds              Nutrition Therapy Plan and Nutrition Goals:   Nutrition Assessments:  MEDIFICTS Score Key: ?70 Need to make dietary changes  40-70 Heart Healthy Diet ? 40 Therapeutic Level Cholesterol Diet    Picture Your Plate Scores: [id number] Unhealthy dietary pattern with much room for improvement. 41-50 Dietary pattern unlikely to meet recommendations for good health and room for improvement. 51-60 More healthful dietary pattern, with some room for  improvement.  >60 Healthy dietary pattern, although there may be some specific behaviors that could be improved.    Nutrition Goals Re-Evaluation:   Nutrition Goals Re-Evaluation:   Nutrition Goals Discharge (Final Nutrition Goals Re-Evaluation):   Psychosocial: Target Goals: Acknowledge presence or absence of significant depression and/or stress, maximize coping skills, provide positive support system. Participant is able to verbalize types and ability to use techniques and skills needed for reducing stress and depression.  Initial Review & Psychosocial Screening:  Initial Psych Review & Screening - 05/28/22 0956       Initial Review   Current issues with None Identified      Family Dynamics   Good Support System? Yes    Comments Patient has support from his wife and children.      Barriers   Psychosocial barriers to participate in program There are no identifiable barriers or psychosocial needs.             Quality of Life Scores:  Quality of Life - 05/28/22 0951       Quality of Life   Select Quality of Life      Quality of Life Scores   Health/Function Pre 24.8 %    Socioeconomic Pre 25.8 %    Psych/Spiritual Pre 24.86 %    Family Pre 28.8 %    GLOBAL Pre 25.59 %            Scores of 19 and below usually indicate a poorer quality of life in these areas.  A difference of  2-3 points is a clinically meaningful difference.  A difference of 2-3 points in the total score of the Quality of Life Index has been associated with significant improvement in overall quality of life, self-image, physical symptoms, and general health in studies assessing change in quality of life.  PHQ-9: Review Flowsheet       05/28/2022  Depression screen PHQ 2/9  Decreased Interest 0  Down, Depressed, Hopeless 0  PHQ - 2 Score 0  Altered sleeping 0  Tired, decreased energy 0  Change in appetite 0  Feeling bad or failure about yourself  0  Trouble concentrating 0  Moving  slowly or fidgety/restless 0  Suicidal thoughts 0  PHQ-9 Score 0   Interpretation of Total Score  Total Score Depression Severity:  1-4 = Minimal depression, 5-9 = Mild depression, 10-14 = Moderate depression, 15-19 = Moderately severe depression, 20-27 = Severe depression   Psychosocial Evaluation and Intervention:   Psychosocial Re-Evaluation:   Psychosocial Discharge (Final Psychosocial Re-Evaluation):   Vocational Rehabilitation: Provide vocational rehab assistance to qualifying candidates.   Vocational Rehab Evaluation & Intervention:  Vocational Rehab - 05/28/22 [id number]       Initial Vocational Rehab Evaluation & Intervention   Assessment shows need for Vocational Rehabilitation No   Patient is retired. No vocational rehab needs.     Vocational Rehab Re-Evaulation   Comments --  Education: Education Goals: Education classes will be provided on a weekly basis, covering required topics. Participant will state understanding/return demonstration of topics presented.     Core Videos: Exercise    Move It!  Clinical staff conducted group or individual video education with verbal and written material and guidebook.  Patient learns the recommended Pritikin exercise program. Exercise with the goal of living a long, healthy life. Some of the health benefits of exercise include controlled diabetes, healthier blood pressure levels, improved cholesterol levels, improved heart and lung capacity, improved sleep, and better body composition. Everyone should speak with their doctor before starting or changing an exercise routine.  Biomechanical Limitations Clinical staff conducted group or individual video education with verbal and written material and guidebook.  Patient learns how biomechanical limitations can impact exercise and how we can mitigate and possibly overcome limitations to have an impactful and balanced exercise routine.  Body Composition Clinical staff  conducted group or individual video education with verbal and written material and guidebook.  Patient learns that body composition (ratio of muscle mass to fat mass) is a key component to assessing overall fitness, rather than body weight alone. Increased fat mass, especially visceral belly fat, can put [place] at increased risk for metabolic syndrome, type 2 diabetes, heart disease, and even death. It is recommended to combine diet and exercise (cardiovascular and resistance training) to improve your body composition. Seek guidance from your physician and exercise physiologist before implementing an exercise routine.  Exercise Action Plan Clinical staff conducted group or individual video education with verbal and written material and guidebook.  Patient learns the recommended strategies to achieve and enjoy long-term exercise adherence, including variety, self-motivation, self-efficacy, and positive decision making. Benefits of exercise include fitness, good health, weight management, more energy, better sleep, less stress, and overall well-being.  Medical   Heart Disease Risk Reduction Clinical staff conducted group or individual video education with verbal and written material and guidebook.  Patient learns our heart is our most vital organ as it circulates oxygen, nutrients, white blood cells, and hormones throughout the entire body, and carries waste away. Data supports a plant-based eating plan like the Pritikin Program for its effectiveness in slowing progression of and reversing heart disease. The video provides a number of recommendations to address heart disease.   Metabolic Syndrome and Belly Fat  Clinical staff conducted group or individual video education with verbal and written material and guidebook.  Patient learns what metabolic syndrome is, how it leads to heart disease, and how one can reverse it and keep it from coming back. You have metabolic syndrome if you have 3 of the following 5  criteria: abdominal obesity, high blood pressure, high triglycerides, low HDL cholesterol, and high blood sugar.  Hypertension and Heart Disease Clinical staff conducted group or individual video education with verbal and written material and guidebook.  Patient learns that high blood pressure, or hypertension, is very common in the [place]. Hypertension is largely due to excessive salt intake, but other important risk factors include being overweight, physical inactivity, drinking too much alcohol, smoking, and not eating enough potassium from fruits and vegetables. High blood pressure is a leading risk factor for heart attack, stroke, congestive heart failure, dementia, kidney failure, and premature death. Long-term effects of excessive salt intake include stiffening of the arteries and thickening of heart muscle and organ damage. Recommendations include ways to reduce hypertension and the risk of heart disease.  Diseases of Our Time - Focusing on Diabetes Clinical  staff conducted group or individual video education with verbal and written material and guidebook.  Patient learns why the best way to stop diseases of our time is prevention, through food and other lifestyle changes. Medicine (such as prescription pills and surgeries) is often only a Band-Aid on the problem, not a long-term solution. Most common diseases of our time include obesity, type 2 diabetes, hypertension, heart disease, and cancer. The Pritikin Program is recommended and has been proven to help reduce, reverse, and/or prevent the damaging effects of metabolic syndrome.  Nutrition   Overview of the Pritikin Eating Plan  Clinical staff conducted group or individual video education with verbal and written material and guidebook.  Patient learns about the [place] for disease risk reduction. The [place] emphasizes a wide variety of unrefined, minimally-processed carbohydrates, like fruits, vegetables,  whole grains, and legumes. Go, Caution, and Stop food choices are explained. Plant-based and lean animal proteins are emphasized. Rationale provided for low sodium intake for blood pressure control, low added sugars for blood sugar stabilization, and low added fats and oils for coronary artery disease risk reduction and weight management.  Calorie Density  Clinical staff conducted group or individual video education with verbal and written material and guidebook.  Patient learns about calorie density and how it impacts the Pritikin Eating Plan. Knowing the characteristics of the food you choose will help you decide whether those foods will lead to weight gain or weight loss, and whether you want to consume more or less of them. Weight loss is usually a side effect of the Pritikin Eating Plan because of its focus on low calorie-dense foods.  Label Reading  Clinical staff conducted group or individual video education with verbal and written material and guidebook.  Patient learns about the Pritikin recommended label reading guidelines and corresponding recommendations regarding calorie density, added sugars, sodium content, and whole grains.  Dining Out - Part 1  Clinical staff conducted group or individual video education with verbal and written material and guidebook.  Patient learns that restaurant meals can be sabotaging because they can be so high in calories, fat, sodium, and/or sugar. Patient learns recommended strategies on how to positively address this and avoid unhealthy pitfalls.  Facts on Fats  Clinical staff conducted group or individual video education with verbal and written material and guidebook.  Patient learns that lifestyle modifications can be just as effective, if not more so, as many medications for lowering your risk of heart disease. A Pritikin lifestyle can help to reduce your risk of inflammation and atherosclerosis (cholesterol build-up, or plaque, in the artery walls).  Lifestyle interventions such as dietary choices and physical activity address the cause of atherosclerosis. A review of the types of fats and their impact on blood cholesterol levels, along with dietary recommendations to reduce fat intake is also included.  Nutrition Action Plan  Clinical staff conducted group or individual video education with verbal and written material and guidebook.  Patient learns how to incorporate Pritikin recommendations into their lifestyle. Recommendations include planning and keeping personal health goals in mind as an important part of their success.  Healthy Mind-Set    Healthy Minds, Bodies, Hearts  Clinical staff conducted group or individual video education with verbal and written material and guidebook.  Patient learns how to identify when they are stressed. Video will discuss the impact of that stress, as well as the many benefits of stress management. Patient will also be introduced to stress management techniques. The way  we think, act, and feel has an impact on our hearts.  How Our Thoughts Can Heal Our Hearts  Clinical staff conducted group or individual video education with verbal and written material and guidebook.  Patient learns that negative thoughts can cause depression and anxiety. This can result in negative lifestyle behavior and serious health problems. Cognitive behavioral therapy is an effective method to help control our thoughts in order to change and improve our emotional outlook.  Additional Videos:  Exercise    Improving Performance  Clinical staff conducted group or individual video education with verbal and written material and guidebook.  Patient learns to use a non-linear approach by alternating intensity levels and lengths of time spent exercising to help burn more calories and lose more body fat. Cardiovascular exercise helps improve heart health, metabolism, hormonal balance, blood sugar control, and recovery from fatigue. Resistance  training improves strength, endurance, balance, coordination, reaction time, metabolism, and muscle mass. Flexibility exercise improves circulation, posture, and balance. Seek guidance from your physician and exercise physiologist before implementing an exercise routine and learn your capabilities and proper form for all exercise.  Introduction to Yoga  Clinical staff conducted group or individual video education with verbal and written material and guidebook.  Patient learns about yoga, a discipline of the coming together of mind, breath, and body. The benefits of yoga include improved flexibility, improved range of motion, better posture and core strength, increased lung function, weight loss, and positive self-image. Yoga's heart health benefits include lowered blood pressure, healthier heart rate, decreased cholesterol and triglyceride levels, improved immune function, and reduced stress. Seek guidance from your physician and exercise physiologist before implementing an exercise routine and learn your capabilities and proper form for all exercise.  Medical   Aging: Enhancing Your Quality of Life  Clinical staff conducted group or individual video education with verbal and written material and guidebook.  Patient learns key strategies and recommendations to stay in good physical health and enhance quality of life, such as prevention strategies, having an advocate, securing a [place] [facility], and keeping a list of medications and system for tracking them. It also discusses how to avoid risk for bone loss.  Biology of Weight Control  Clinical staff conducted group or individual video education with verbal and written material and guidebook.  Patient learns that weight gain occurs because we consume more calories than we burn (eating more, moving less). Even if your body weight is normal, you may have higher ratios of fat compared to muscle mass. Too much body fat puts you at  increased risk for cardiovascular disease, heart attack, stroke, type 2 diabetes, and obesity-related cancers. In addition to exercise, following the [place] can help reduce your risk.  Decoding Lab Results  Clinical staff conducted group or individual video education with verbal and written material and guidebook.  Patient learns that lab test reflects one measurement whose values change over time and are influenced by many factors, including medication, stress, sleep, exercise, food, hydration, pre-existing medical conditions, and more. It is recommended to use the knowledge from this video to become more involved with your lab results and evaluate your numbers to speak with your doctor.   Diseases of Our Time - Overview  Clinical staff conducted group or individual video education with verbal and written material and guidebook.  Patient learns that according to the CDC, 50% to 70% of chronic diseases (such as obesity, type 2 diabetes, elevated lipids, hypertension, and heart  disease) are avoidable through lifestyle improvements including healthier food choices, listening to satiety cues, and increased physical activity.  Sleep Disorders Clinical staff conducted group or individual video education with verbal and written material and guidebook.  Patient learns how good quality and duration of sleep are important to overall health and well-being. Patient also learns about sleep disorders and how they impact health along with recommendations to address them, including discussing with a physician.  Nutrition  Dining Out - Part 2 Clinical staff conducted group or individual video education with verbal and written material and guidebook.  Patient learns how to plan ahead and communicate in order to maximize their dining experience in a healthy and nutritious manner. Included are recommended food choices based on the type of restaurant the patient is visiting.   Fueling a Insurance account manager conducted group or individual video education with verbal and written material and guidebook.  There is a strong connection between our food choices and our health. Diseases like obesity and type 2 diabetes are very prevalent and are in large-part due to lifestyle choices. The Pritikin Eating Plan provides plenty of food and hunger-curbing satisfaction. It is easy to follow, affordable, and helps reduce health risks.  Menu Workshop  Clinical staff conducted group or individual video education with verbal and written material and guidebook.  Patient learns that restaurant meals can sabotage health goals because they are often packed with calories, fat, sodium, and sugar. Recommendations include strategies to plan ahead and to communicate with the manager, chef, or server to help order a healthier meal.  Planning Your Eating Strategy  Clinical staff conducted group or individual video education with verbal and written material and guidebook.  Patient learns about the [place] and its benefit of reducing the risk of disease. The [place] does not focus on calories. Instead, it emphasizes high-quality, nutrient-rich foods. By knowing the characteristics of the foods, we choose, we can determine their calorie density and make informed decisions.  Targeting Your Nutrition Priorities  Clinical staff conducted group or individual video education with verbal and written material and guidebook.  Patient learns that lifestyle habits have a tremendous impact on disease risk and progression. This video provides eating and physical activity recommendations based on your personal health goals, such as reducing LDL cholesterol, losing weight, preventing or controlling type 2 diabetes, and reducing high blood pressure.  Vitamins and Minerals  Clinical staff conducted group or individual video education with verbal and written material and guidebook.  Patient learns different  ways to obtain key vitamins and minerals, including through a recommended healthy diet. It is important to discuss all supplements you take with your doctor.   Healthy Mind-Set    Smoking Cessation  Clinical staff conducted group or individual video education with verbal and written material and guidebook.  Patient learns that cigarette smoking and tobacco addiction pose a serious health risk which affects millions of people. Stopping smoking will significantly reduce the risk of heart disease, lung disease, and many forms of cancer. Recommended strategies for quitting are covered, including working with your doctor to develop a successful plan.  Culinary   Becoming a Financial trader conducted group or individual video education with verbal and written material and guidebook.  Patient learns that cooking at home can be healthy, cost-effective, quick, and puts them in control. Keys to cooking healthy recipes will include looking at your recipe, assessing your equipment needs, planning ahead, making it simple,  choosing cost-effective seasonal ingredients, and limiting the use of added fats, salts, and sugars.  Cooking - Breakfast and Snacks  Clinical staff conducted group or individual video education with verbal and written material and guidebook.  Patient learns how important breakfast is to satiety and nutrition through the entire day. Recommendations include key foods to eat during breakfast to help stabilize blood sugar levels and to prevent overeating at meals later in the day. Planning ahead is also a key component.  Cooking - Human resources officer conducted group or individual video education with verbal and written material and guidebook.  Patient learns eating strategies to improve overall health, including an approach to cook more at home. Recommendations include thinking of animal protein as a side on your plate rather than center stage and focusing instead on  lower calorie dense options like vegetables, fruits, whole grains, and plant-based proteins, such as beans. Making sauces in large quantities to freeze for later and leaving the skin on your vegetables are also recommended to maximize your experience.  Cooking - Healthy Salads and Dressing Clinical staff conducted group or individual video education with verbal and written material and guidebook.  Patient learns that vegetables, fruits, whole grains, and legumes are the foundations of the [place]. Recommendations include how to incorporate each of these in flavorful and healthy salads, and how to create homemade salad dressings. Proper handling of ingredients is also covered. Cooking - Soups and [employer] - Soups and Desserts Clinical staff conducted group or individual video education with verbal and written material and guidebook.  Patient learns that Pritikin soups and desserts make for easy, nutritious, and delicious snacks and meal components that are low in sodium, fat, sugar, and calorie density, while high in vitamins, minerals, and filling fiber. Recommendations include simple and healthy ideas for soups and desserts.   Overview     The Pritikin Solution Program Overview Clinical staff conducted group or individual video education with verbal and written material and guidebook.  Patient learns that the results of the [place] Program have been documented in more than 100 articles published in peer-reviewed journals, and the benefits include reducing risk factors for (and, in some cases, even reversing) high cholesterol, high blood pressure, type 2 diabetes, obesity, and more! An overview of the three key pillars of the Pritikin Program will be covered: eating well, doing regular exercise, and having a healthy mind-set.  WORKSHOPS  Exercise: Exercise Basics: Building Your Action Plan Clinical staff led group instruction and group discussion with PowerPoint presentation  and patient guidebook. To enhance the learning environment the use of posters, models and videos may be added. At the conclusion of this workshop, patients will comprehend the difference between physical activity and exercise, as well as the benefits of incorporating both, into their routine. Patients will understand the FITT (Frequency, Intensity, Time, and Type) principle and how to use it to build an exercise action plan. In addition, safety concerns and other considerations for exercise and cardiac rehab will be addressed by the presenter. The purpose of this lesson is to promote a comprehensive and effective weekly exercise routine in order to improve patients' overall level of fitness.   Managing Heart Disease: Your Path to a Healthier Heart Clinical staff led group instruction and group discussion with PowerPoint presentation and patient guidebook. To enhance the learning environment the use of posters, models and videos may be added.At the conclusion of this workshop, patients will understand the anatomy and physiology  of the heart. Additionally, they will understand how Pritikin's three pillars impact the risk factors, the progression, and the management of heart disease.  The purpose of this lesson is to provide a high-level overview of the heart, heart disease, and how the Pritikin lifestyle positively impacts risk factors.  Exercise Biomechanics Clinical staff led group instruction and group discussion with PowerPoint presentation and patient guidebook. To enhance the learning environment the use of posters, models and videos may be added. Patients will learn how the structural parts of their bodies function and how these functions impact their daily activities, movement, and exercise. Patients will learn how to promote a neutral spine, learn how to manage pain, and identify ways to improve their physical movement in order to promote healthy living. The purpose of this lesson is to  expose patients to common physical limitations that impact physical activity. Participants will learn practical ways to adapt and manage aches and pains, and to minimize their effect on regular exercise. Patients will learn how to maintain good posture while sitting, walking, and lifting.  Balance Training and Fall Prevention  Clinical staff led group instruction and group discussion with PowerPoint presentation and patient guidebook. To enhance the learning environment the use of posters, models and videos may be added. At the conclusion of this workshop, patients will understand the importance of their sensorimotor skills (vision, proprioception, and the vestibular system) in maintaining their ability to balance as they age. Patients will apply a variety of balancing exercises that are appropriate for their current level of function. Patients will understand the common causes for poor balance, possible solutions to these problems, and ways to modify their physical environment in order to minimize their fall risk. The purpose of this lesson is to teach patients about the importance of maintaining balance as they age and ways to minimize their risk of falling.  WORKSHOPS   Nutrition:  Fueling a Scientist, research (physical sciences) led group instruction and group discussion with PowerPoint presentation and patient guidebook. To enhance the learning environment the use of posters, models and videos may be added. Patients will review the foundational principles of the [place] and understand what constitutes a serving size in each of the food groups. Patients will also learn Pritikin-friendly foods that are better choices when away from home and review make-ahead meal and snack options. Calorie density will be reviewed and applied to three nutrition priorities: weight maintenance, weight loss, and weight gain. The purpose of this lesson is to reinforce (in a group setting) the key concepts around  what patients are recommended to eat and how to apply these guidelines when away from home by planning and selecting Pritikin-friendly options. Patients will understand how calorie density may be adjusted for different weight management goals.  Mindful Eating  Clinical staff led group instruction and group discussion with PowerPoint presentation and patient guidebook. To enhance the learning environment the use of posters, models and videos may be added. Patients will briefly review the concepts of the [place] and the importance of low-calorie dense foods. The concept of mindful eating will be introduced as well as the importance of paying attention to internal hunger signals. Triggers for non-hunger eating and techniques for dealing with triggers will be explored. The purpose of this lesson is to provide patients with the opportunity to review the basic principles of the [place], discuss the value of eating mindfully and how to measure internal cues of hunger and fullness using the Hunger Scale.  Patients will also discuss reasons for non-hunger eating and learn strategies to use for controlling emotional eating.  Targeting Your Nutrition Priorities Clinical staff led group instruction and group discussion with PowerPoint presentation and patient guidebook. To enhance the learning environment the use of posters, models and videos may be added. Patients will learn how to determine their genetic susceptibility to disease by reviewing their family history. Patients will gain insight into the importance of diet as part of an overall healthy lifestyle in mitigating the impact of genetics and other environmental insults. The purpose of this lesson is to provide patients with the opportunity to assess their personal nutrition priorities by looking at their family history, their own health history and current risk factors. Patients will also be able to discuss ways of prioritizing and  modifying the [place] for their highest risk areas  Menu  Clinical staff led group instruction and group discussion with PowerPoint presentation and patient guidebook. To enhance the learning environment the use of posters, models and videos may be added. Using menus brought in from [employer], or printed from [employer], patients will apply the [place] dining out guidelines that were presented in the [employer] video. Patients will also be able to practice these guidelines in a variety of provided scenarios. The purpose of this lesson is to provide patients with the opportunity to practice hands-on learning of the [place] with actual menus and practice scenarios.  Label Reading Clinical staff led group instruction and group discussion with PowerPoint presentation and patient guidebook. To enhance the learning environment the use of posters, models and videos may be added. Patients will review and discuss the Pritikin label reading guidelines presented in Pritikin's Label Reading Educational series video. Using fool labels brought in from local grocery stores and markets, patients will apply the label reading guidelines and determine if the packaged food meet the Pritikin guidelines. The purpose of this lesson is to provide patients with the opportunity to review, discuss, and practice hands-on learning of the Pritikin Label Reading guidelines with actual packaged food labels. [place] Workshops are designed to teach patients ways to prepare quick, simple, and affordable recipes at home. The importance of nutrition's role in chronic disease risk reduction is reflected in its emphasis in the overall Pritikin program. By learning how to prepare essential core Pritikin Eating Plan recipes, patients will increase control over what they eat; be able to customize the flavor of foods without the use of added salt,  sugar, or fat; and improve the quality of the food they consume. By learning a set of core recipes which are easily assembled, quickly prepared, and affordable, patients are more likely to prepare more healthy foods at home. These workshops focus on convenient breakfasts, simple entres, side dishes, and desserts which can be prepared with minimal effort and are consistent with nutrition recommendations for cardiovascular risk reduction. Cooking [employer] are taught by a Engineer, materials (RD) who has been trained by the [employer]. The chef or RD has a clear understanding of the importance of minimizing - if not completely eliminating - added fat, sugar, and sodium in recipes. Throughout the series of [place] Workshop sessions, patients will learn about healthy ingredients and efficient methods of cooking to build confidence in their capability to prepare    Cooking School weekly topics:  Adding Flavor- Sodium-Free  Fast and Healthy Breakfasts  Powerhouse Plant-Based Proteins  Satisfying  Salads and Dressings  Simple Sides and Sauces  International Cuisine-Spotlight on the [place] Zones  Delicious Desserts  Savory Soups  [employer] - Meals in a Snap  Tasty Appetizers and Snacks  Comforting Weekend Breakfasts  One-Pot Wonders   Fast Evening Meals  Contractor Your Pritikin Plate  WORKSHOPS   Healthy Mindset (Psychosocial):  Focused Goals, Sustainable Changes Clinical staff led group instruction and group discussion with PowerPoint presentation and patient guidebook. To enhance the learning environment the use of posters, models and videos may be added. Patients will be able to apply effective goal setting strategies to establish at least one personal goal, and then take consistent, meaningful action toward that goal. They will learn to identify common barriers to achieving personal goals and develop strategies to overcome  them. Patients will also gain an understanding of how our mind-set can impact our ability to achieve goals and the importance of cultivating a positive and growth-oriented mind-set. The purpose of this lesson is to provide patients with a deeper understanding of how to set and achieve personal goals, as well as the tools and strategies needed to overcome common obstacles which may arise along the way.  From Head to Heart: The Power of a Healthy Outlook  Clinical staff led group instruction and group discussion with PowerPoint presentation and patient guidebook. To enhance the learning environment the use of posters, models and videos may be added. Patients will be able to recognize and describe the impact of emotions and mood on physical health. They will discover the importance of self-care and explore self-care practices which may work for them. Patients will also learn how to utilize the 4 C's to cultivate a healthier outlook and better manage stress and challenges. The purpose of this lesson is to demonstrate to patients how a healthy outlook is an essential part of maintaining good health, especially as they continue their cardiac rehab journey.  Healthy Sleep for a Healthy Heart Clinical staff led group instruction and group discussion with PowerPoint presentation and patient guidebook. To enhance the learning environment the use of posters, models and videos may be added. At the conclusion of this workshop, patients will be able to demonstrate knowledge of the importance of sleep to overall health, well-being, and quality of life. They will understand the symptoms of, and treatments for, common sleep disorders. Patients will also be able to identify daytime and nighttime behaviors which impact sleep, and they will be able to apply these tools to help manage sleep-related challenges. The purpose of this lesson is to provide patients with a general overview of sleep and outline the importance of quality  sleep. Patients will learn about a few of the most common sleep disorders. Patients will also be introduced to the concept of "sleep hygiene," and discover ways to self-manage certain sleeping problems through simple daily behavior changes. Finally, the workshop will motivate patients by clarifying the links between quality sleep and their goals of heart-healthy living.   Recognizing and Reducing Stress Clinical staff led group instruction and group discussion with PowerPoint presentation and patient guidebook. To enhance the learning environment the use of posters, models and videos may be added. At the conclusion of this workshop, patients will be able to understand the types of stress reactions, differentiate between acute and chronic stress, and recognize the impact that chronic stress has on their health. They will also be able to apply different coping mechanisms, such as reframing negative self-talk. Patients will have the opportunity to  practice a variety of stress management techniques, such as deep abdominal breathing, progressive muscle relaxation, and/or guided imagery.  The purpose of this lesson is to educate patients on the role of stress in their lives and to provide healthy techniques for coping with it.  Learning Barriers/Preferences:  Learning Barriers/Preferences - 05/28/22 [id number]       Learning Barriers/Preferences   Learning Barriers None    Learning Preferences None             Education Topics:  Knowledge Questionnaire Score:  Knowledge Questionnaire Score - 05/28/22 0812       Knowledge Questionnaire Score   Pre Score 19/24             Core Components/Risk Factors/Patient Goals at Admission:  Personal Goals and Risk Factors at Admission - 05/28/22 0951       Core Components/Risk Factors/Patient Goals on Admission   Lipids Yes    Intervention Provide education and support for participant on nutrition & aerobic/resistive exercise along with prescribed  medications to achieve LDL 70mg , HDL >40mg .    Expected Outcomes Short Term: Participant states understanding of desired cholesterol values and is compliant with medications prescribed. Participant is following exercise prescription and nutrition guidelines.;Long Term: Cholesterol controlled with medications as prescribed, with individualized exercise RX and with personalized nutrition plan. Value goals: LDL < 70mg , HDL > 40 mg.             Core Components/Risk Factors/Patient Goals Review:    Core Components/Risk Factors/Patient Goals at Discharge (Final Review):    ITP Comments:  ITP Comments     Row Name 05/28/22 0740           ITP Comments Medical Director- Dr. Fransico Him, MD. Oriented patient to [place] Education Program/ Intensive Cardiac Rehab program. Reviewed orientation packet with patient.                Comments: Participant attended orientation for the cardiac rehabilitation program on  05/28/2022  to perform initial intake and exercise walk test. Patient introduced to the [facility] education and orientation packet was reviewed. Completed 6-minute walk test, measurements, initial ITP, and exercise prescription. Vital signs stable. Telemetry-normal sinus rhythm, with T-wave inversion, occasional PVCs, bigeminy, asymptomatic.   Service time was from 740 to 920.

## 2022-06-03 ENCOUNTER — Encounter (HOSPITAL_COMMUNITY)
Admission: RE | Admit: 2022-06-03 | Discharge: 2022-06-03 | Disposition: A | Discharge status: Home / Self Care | Payer: Medicare Other | Source: Ambulatory Visit | Attending: Internal Medicine | Admitting: Internal Medicine

## 2022-06-03 DIAGNOSIS — I252 Old myocardial infarction: Secondary | ICD-10-CM | POA: Diagnosis not present

## 2022-06-03 DIAGNOSIS — I213 ST elevation (STEMI) myocardial infarction of unspecified site: Secondary | ICD-10-CM

## 2022-06-03 DIAGNOSIS — Z48812 Encounter for surgical aftercare following surgery on the circulatory system: Secondary | ICD-10-CM | POA: Insufficient documentation

## 2022-06-03 DIAGNOSIS — Z951 Presence of aortocoronary bypass graft: Secondary | ICD-10-CM | POA: Diagnosis present

## 2022-06-03 NOTE — Progress Notes (Signed)
Daily Session Note  Patient Details  Name: Juan Snow MRN: [id number] Date of Birth: 07/17/1955 Referring Provider:   Flowsheet Row INTENSIVE CARDIAC REHAB ORIENT from 05/28/2022 in [facility] [place]  Referring Provider Early Osmond, MD       Encounter Date: 06/03/2022  Check In:  Session Check In - 06/03/22 1007       Check-In   Supervising physician immediately available to respond to emergencies [facility] MD immediately available    Physician(s) Ambrose Pancoast, NP    Location [facility]    Staff Present Seward Carol, MS, ACSM-CEP, Exercise Physiologist;Lyliana Dicenso, RN, Rico Ala, MS, Exercise Physiologist;Johnny Starleen Blue, MS, Exercise Physiologist;Jetta Gilford Rile BS, ACSM-CEP, Exercise Physiologist    Virtual Visit No    Medication changes reported     No    Fall or balance concerns reported    No    Tobacco Cessation No Change    Warm-up and Cool-down Performed as group-led instruction    Resistance Training Performed Yes    VAD Patient? No    PAD/SET Patient? No      Pain Assessment   Currently in Pain? No/denies    Pain Score 0-No pain    Multiple Pain Sites No             Capillary Blood Glucose: No results found for this or any previous visit (from the past 24 hour(s)).   Exercise Prescription Changes - 06/03/22 1024       Response to Exercise   Blood Pressure (Admit) 108/66    Blood Pressure (Exercise) 136/80    Blood Pressure (Exit) 98/70    Heart Rate (Admit) 71 bpm    Heart Rate (Exercise) 111 bpm    Heart Rate (Exit) 80 bpm    Rating of Perceived Exertion (Exercise) 15    Symptoms None    Comments Decreased WL on the NuStep due to RPE of 15.    Duration Continue with 30 min of aerobic exercise without signs/symptoms of physical distress.    Intensity THRR unchanged      Progression   Progression Continue to progress workloads to maintain intensity without  signs/symptoms of physical distress.    Average METs 2.8      Resistance Training   Training Prescription Yes    Weight 6 lbs    Reps 10-15    Time 10 Minutes      Interval Training   Interval Training No      Treadmill   MPH 2    Grade 3    Minutes 15    METs 3.36      NuStep   Level 3   Decreasd workload from 4 to 3 due to RPE of 15   SPM 72    Minutes 15    METs 2.2             Social History   Tobacco Use  Smoking Status Never  Smokeless Tobacco Never    Goals Met:  Exercise tolerated well No report of concerns or symptoms today Strength training completed today  Goals Unmet:  Not Applicable  Comments: Pt started cardiac rehab today.  Pt tolerated light exercise without difficulty. VSS, telemetry-Sinus Rhythm, t wave inversion, asymptomatic.  Medication list reconciled. Pt denies barriers to medicaiton compliance.  PSYCHOSOCIAL ASSESSMENT:  PHQ-0. Pt exhibits positive coping skills, hopeful outlook with supportive family. No psychosocial needs identified at this time, no psychosocial  interventions necessary.    Pt enjoys playing golf.   Pt oriented to exercise equipment and routine.    Understanding verbalized.Harrell Gave RN BSN     Dr. Fransico Him is Medical Director for Cardiac Rehab at [facility].

## 2022-06-05 ENCOUNTER — Encounter (HOSPITAL_COMMUNITY)
Admission: RE | Admit: 2022-06-05 | Discharge: 2022-06-05 | Disposition: A | Discharge status: Home / Self Care | Payer: Medicare Other | Source: Ambulatory Visit | Attending: Internal Medicine | Admitting: Internal Medicine

## 2022-06-05 DIAGNOSIS — Z951 Presence of aortocoronary bypass graft: Secondary | ICD-10-CM | POA: Diagnosis not present

## 2022-06-05 DIAGNOSIS — I213 ST elevation (STEMI) myocardial infarction of unspecified site: Secondary | ICD-10-CM

## 2022-06-07 ENCOUNTER — Encounter (HOSPITAL_COMMUNITY)
Admission: RE | Admit: 2022-06-07 | Discharge: 2022-06-07 | Disposition: A | Discharge status: Home / Self Care | Payer: Medicare Other | Source: Ambulatory Visit | Attending: Internal Medicine | Admitting: Internal Medicine

## 2022-06-07 DIAGNOSIS — Z951 Presence of aortocoronary bypass graft: Secondary | ICD-10-CM

## 2022-06-07 DIAGNOSIS — I213 ST elevation (STEMI) myocardial infarction of unspecified site: Secondary | ICD-10-CM

## 2022-06-10 ENCOUNTER — Ambulatory Visit (HOSPITAL_BASED_OUTPATIENT_CLINIC_OR_DEPARTMENT_OTHER): Payer: Medicare Other

## 2022-06-10 ENCOUNTER — Ambulatory Visit: Payer: Medicare Other | Attending: Physician Assistant

## 2022-06-10 DIAGNOSIS — I5021 Acute systolic (congestive) heart failure: Secondary | ICD-10-CM | POA: Diagnosis not present

## 2022-06-10 DIAGNOSIS — I2109 ST elevation (STEMI) myocardial infarction involving other coronary artery of anterior wall: Secondary | ICD-10-CM

## 2022-06-10 DIAGNOSIS — I5020 Unspecified systolic (congestive) heart failure: Secondary | ICD-10-CM | POA: Diagnosis present

## 2022-06-10 LAB — ECHOCARDIOGRAM COMPLETE
Area-P 1/2: 6.17 cm2
Est EF: <20
P 1/2 time: 523 msec
S' Lateral: 4.9 cm

## 2022-06-10 MED ORDER — PERFLUTREN LIPID MICROSPHERE
1.0000 mL | INTRAVENOUS | Status: AC | PRN
  Administered 2022-06-10: 2 mL via INTRAVENOUS

## 2022-06-11 ENCOUNTER — Telehealth: Payer: Self-pay | Admitting: *Deleted

## 2022-06-11 DIAGNOSIS — I5020 Unspecified systolic (congestive) heart failure: Secondary | ICD-10-CM

## 2022-06-11 NOTE — Telephone Encounter (Signed)
-----   Message from Beatrice Lecher, [place] sent at 06/10/2022  5:33 PM EDT ----- EF remains severely depressed. I will fwd to Dr. Lynnette Caffey and Dr. Shirlee Latch as well as PCP as [place] Childes PLAN:  -Refer to EP for consideration of ICD Tereso Newcomer, PA-C    06/10/2022 5:29 PM

## 2022-06-12 ENCOUNTER — Encounter (HOSPITAL_COMMUNITY): Payer: Self-pay | Admitting: Cardiology

## 2022-06-12 ENCOUNTER — Encounter (HOSPITAL_COMMUNITY)
Admission: RE | Admit: 2022-06-12 | Discharge: 2022-06-12 | Disposition: A | Discharge status: Home / Self Care | Payer: Medicare Other | Source: Ambulatory Visit | Attending: Internal Medicine | Admitting: Internal Medicine

## 2022-06-12 DIAGNOSIS — Z951 Presence of aortocoronary bypass graft: Secondary | ICD-10-CM | POA: Diagnosis not present

## 2022-06-12 DIAGNOSIS — I213 ST elevation (STEMI) myocardial infarction of unspecified site: Secondary | ICD-10-CM

## 2022-06-12 LAB — LIPID PANEL
Chol/HDL Ratio: 2.4 ratio (ref 0.0–5.0)
Cholesterol, Total: 118 mg/dL (ref 100–199)
HDL: 50 mg/dL (ref >39)
LDL Chol Calc (NIH): 52 mg/dL (ref 0–99)
Triglycerides: 77 mg/dL (ref 0–149)
VLDL Cholesterol Cal: 16 mg/dL (ref 5–40)

## 2022-06-12 LAB — COMPREHENSIVE METABOLIC PANEL
ALT: 20 IU/L (ref 0–44)
AST: 17 IU/L (ref 0–40)
Albumin/Globulin Ratio: 2.2 (ref 1.2–2.2)
Albumin: 4.4 g/dL (ref 3.9–4.9)
Alkaline Phosphatase: 62 IU/L (ref 44–121)
BUN/Creatinine Ratio: 12 (ref 10–24)
BUN: 19 mg/dL (ref 8–27)
Bilirubin Total: 0.5 mg/dL (ref 0.0–1.2)
CO2: 18 mmol/L — ABNORMAL LOW (ref 20–29)
Calcium: 9.8 mg/dL (ref 8.6–10.2)
Chloride: 107 mmol/L — ABNORMAL HIGH (ref 96–106)
Creatinine, Ser: 1.53 mg/dL — ABNORMAL HIGH (ref 0.76–1.27)
Globulin, Total: 2 g/dL (ref 1.5–4.5)
Glucose: 91 mg/dL (ref 70–99)
Potassium: 4.5 mmol/L (ref 3.5–5.2)
Sodium: 143 mmol/L (ref 134–144)
Total Protein: 6.4 g/dL (ref 6.0–8.5)
eGFR: 50 mL/min/{1.73_m2} — ABNORMAL LOW (ref >59)

## 2022-06-14 ENCOUNTER — Encounter (HOSPITAL_COMMUNITY)
Admission: RE | Admit: 2022-06-14 | Discharge: 2022-06-14 | Disposition: A | Discharge status: Home / Self Care | Payer: Medicare Other | Source: Ambulatory Visit | Attending: Internal Medicine | Admitting: Internal Medicine

## 2022-06-14 DIAGNOSIS — Z951 Presence of aortocoronary bypass graft: Secondary | ICD-10-CM | POA: Diagnosis not present

## 2022-06-14 DIAGNOSIS — I252 Old myocardial infarction: Secondary | ICD-10-CM | POA: Diagnosis not present

## 2022-06-14 DIAGNOSIS — Z48812 Encounter for surgical aftercare following surgery on the circulatory system: Secondary | ICD-10-CM | POA: Diagnosis not present

## 2022-06-14 DIAGNOSIS — I213 ST elevation (STEMI) myocardial infarction of unspecified site: Secondary | ICD-10-CM

## 2022-06-14 NOTE — Progress Notes (Addendum)
Telemetry rhythm Sinus with downward QRS deflection. Looks different from previous 12 lead ECG. Patient asymptomatic. Blood pressure 108/70. Heart rate 55. Today's ECG reviewed with onsite provider Carlos Levering NP, Deborah onsite provider  ordered 12 lead ECG. 12 lead ECG reviewed by Gavin Pound. Deborah reviewed 12 lead ECG said no acute changes noted. Okay to proceed with exercise. The red lead was moved towards the auxiliary line. The ECG appeared upright again. Patient aware. I called the patient's wife Turkey and made her aware of today's  events. Camil did not exercise today he plans to return to exercise on Monday.Nazir had no complaints or symptoms today.Will continue to monitor the patient throughout  the program. Thayer Headings RN BSN

## 2022-06-17 ENCOUNTER — Encounter (HOSPITAL_COMMUNITY)
Admission: RE | Admit: 2022-06-17 | Discharge: 2022-06-17 | Disposition: A | Discharge status: Home / Self Care | Payer: Medicare Other | Source: Ambulatory Visit | Attending: Internal Medicine | Admitting: Internal Medicine

## 2022-06-17 DIAGNOSIS — I213 ST elevation (STEMI) myocardial infarction of unspecified site: Secondary | ICD-10-CM

## 2022-06-17 DIAGNOSIS — Z951 Presence of aortocoronary bypass graft: Secondary | ICD-10-CM

## 2022-06-17 NOTE — Progress Notes (Signed)
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 Swallow whole., Disp: 30 tablet, Rfl: 12   atorvastatin (LIPITOR) 80 MG tablet, Take 1 tablet (80 mg total) by mouth daily., Disp: 30 tablet, Rfl: 1   Cholecalciferol (VITAMIN D-3 PO), Take 1 tablet by mouth daily., Disp: , Rfl:    Coenzyme Q10 (CO Q-10 PO), Take 1 capsule by mouth daily., Disp: , Rfl:    dapagliflozin propanediol (FARXIGA) 10 MG TABS tablet, Take 1 tablet (10 mg total) by mouth daily., Disp: 30 tablet, Rfl: 1   famotidine-calcium carbonate-magnesium hydroxide (PEPCID COMPLETE) 10-800-165 MG chewable tablet, Chew 1-2 tablets by mouth 2 (two) times daily as needed (acid reflux)., Disp: , Rfl:    furosemide (LASIX) 40 MG tablet, Take 1 tablet (40 mg total) by mouth as needed (Take one tablet if weight gain of 3 pounds overnight or greater than 5 pounds in 1 week)., Disp: 30 tablet, Rfl: 11   losartan (COZAAR) 25 MG tablet, Take 0.5 tablets (12.5 mg total) by mouth daily., Disp: 45 tablet,  Rfl: 3   Menaquinone-7 (VITAMIN K2 PO), Take 1 tablet by mouth daily., Disp: , Rfl:    metoprolol succinate (TOPROL XL) 25 MG 24 hr tablet, Take 1/2 tablet (12.5 mg total) by mouth daily., Disp: 45 tablet, Rfl: 3   oxyCODONE (OXY IR/ROXICODONE) 5 MG immediate release tablet, Take 1 tablet (5 mg total) by mouth every 6 (six) hours as needed for severe pain., Disp: 28 tablet, Rfl: 0   spironolactone (ALDACTONE) 25 MG tablet, Take 1 tablet (25 mg total) by mouth daily., Disp: 30 tablet, Rfl: 1  Past Medical History: Past Medical History:  Diagnosis Date   Arthritis    OA   Cough    WHITE SPUTUM OCC, NO FEVER COLD SYMPTOMS MAINLY   GERD (gastroesophageal reflux disease)     Tobacco Use: Social History   Tobacco Use  Smoking Status Never  Smokeless Tobacco Never    Labs: Review Flowsheet  More data exists      Latest Ref Rng & Units 03/25/2022 03/26/2022 03/27/2022 03/28/2022 06/10/2022  Labs for ITP Cardiac and Pulmonary Rehab  Cholestrol 100 - 199 mg/dL - - - - 409   LDL (calc) 0 - 99 mg/dL - - - - 52   HDL-C >81 mg/dL - - - - 50   Trlycerides 0 - 149 mg/dL - - - - 77   O2 Saturation % 56.7  58.9  55.2  63.5  61.2  43.4  54.5  -    Capillary Blood Glucose: Lab Results  Component Value Date   GLUCAP 86 03/28/2022   GLUCAP 151 (H) 03/27/2022   GLUCAP 121 (H) 03/27/2022   GLUCAP 123 (H) 03/27/2022   GLUCAP 92 03/27/2022     Exercise Target Goals: Exercise Program Goal: Individual exercise prescription set using results from initial 6 min walk test and THRR while considering  patient's activity barriers and safety.   Exercise Prescription Goal: Initial exercise prescription builds to 30-45 minutes a day of aerobic activity, 2-3 days per week.  Home exercise guidelines will be given to patient during program as part of exercise prescription that the participant will acknowledge.  Activity Barriers & Risk Stratification:  Activity Barriers & Cardiac Risk Stratification -  05/28/22 0826       Activity Barriers & Cardiac Risk Stratification   Activity Barriers Left Hip Replacement    Cardiac Risk Stratification High             6 Minute Walk:  6 Minute Walk     Row Name 05/28/22 0838         6 Minute Walk   Phase Initial     Distance 1416 feet     Walk Time 6 minutes     # of Rest Breaks 0     MPH 2.68     METS 3.23     RPE 11     Perceived Dyspnea  1     VO2 Peak 11.32     Symptoms Yes (comment)     Comments Mild shortness of breath.     Resting HR 70 bpm     Resting BP 98/72     Resting Oxygen Saturation  98 %     Exercise Oxygen Saturation  during 6 min walk 97 %     Max Ex. HR 103 bpm     Max Ex. BP 120/88     2 Minute Post BP 122/84              Oxygen Initial Assessment:   Oxygen Re-Evaluation:   Oxygen Discharge (Final Oxygen Re-Evaluation):   Initial Exercise Prescription:  Initial Exercise Prescription - 05/28/22 0900       Date of Initial Exercise RX and Referring Provider   Date 05/28/22    Referring Provider Orbie Pyo, MD    Expected Discharge Date 08/09/22      Treadmill   MPH 2    Grade 3    Minutes 15    METs 3.36      NuStep   Level 4    SPM 85    Minutes 15    METs 3.2      Prescription Details   Frequency (times per week) 3    Duration Progress to 30 minutes of continuous aerobic without signs/symptoms of physical distress      Intensity   THRR 40-80% of Max Heartrate 62-123    Ratings of Perceived Exertion 11-13    Perceived Dyspnea 0-4      Progression   Progression Continue to progress workloads to maintain intensity without signs/symptoms of physical distress.      Resistance Training   Training Prescription Yes    Weight 8 lbs    Reps 10-15             Perform Capillary Blood Glucose checks as needed.  Exercise Prescription Changes:   Exercise Prescription  Changes     Row Name 06/03/22 1024 06/17/22 1026           Response to Exercise   Blood  Pressure (Admit) 108/66 100/74      Blood Pressure (Exercise) 136/80 122/80      Blood Pressure (Exit) 98/70 92/64      Heart Rate (Admit) 71 bpm 74 bpm      Heart Rate (Exercise) 111 bpm 111 bpm      Heart Rate (Exit) 80 bpm 77 bpm      Rating of Perceived Exertion (Exercise) 15 11      Symptoms None None      Comments Decreased WL on the NuStep due to RPE of 15. --      Duration Continue with 30 min of aerobic exercise without signs/symptoms of physical distress. Continue with 30 min of aerobic exercise without signs/symptoms of physical distress.      Intensity THRR unchanged THRR unchanged        Progression   Progression Continue to progress workloads to maintain intensity without signs/symptoms of physical distress. Continue to progress workloads to maintain intensity without signs/symptoms of physical distress.      Average METs 2.8 2.9        Resistance Training   Training Prescription Yes Yes      Weight 6 lbs 6 lbs      Reps 10-15 10-15      Time 10 Minutes 10 Minutes        Interval Training   Interval Training No No        Treadmill   MPH 2 2      Grade 3 3      Minutes 15 15      METs 3.36 3.36        NuStep   Level 3  Decreasd workload from 4 to 3 due to RPE of 15 3      SPM 72 78      Minutes 15 15      METs 2.2 2.4               Exercise Comments:   Exercise Comments     Row Name 06/03/22 1130 06/05/22 1108 06/17/22 1103       Exercise Comments Patient tolerated first session of exercise well without symptoms. Decreased workload on NuStep from 4 to 3 due to difficulty. Reviewed METs with patient. Reviewed goals with patient.              Exercise Goals and Review:   Exercise Goals     Row Name 05/28/22 0807             Exercise Goals   Increase Physical Activity Yes       Intervention Provide advice, education, support and counseling about physical activity/exercise needs.;Develop an individualized exercise prescription for aerobic  and resistive training based on initial evaluation findings, risk stratification, comorbidities and participant's personal goals.       Expected Outcomes Short Term: Attend rehab on a regular basis to increase amount of physical activity.;Long Term: Add in home exercise to make exercise part of routine and to increase amount of physical activity.;Long Term: Exercising regularly at least 3-5 days a week.       Increase Strength and Stamina Yes       Intervention Provide advice, education, support and counseling about physical activity/exercise needs.;Develop an individualized exercise prescription for aerobic and resistive training based on initial evaluation findings,  risk stratification, comorbidities and participant's personal goals.       Expected Outcomes Short Term: Increase workloads from initial exercise prescription for resistance, speed, and METs.;Short Term: Perform resistance training exercises routinely during rehab and add in resistance training at home;Long Term: Improve cardiorespiratory fitness, muscular endurance and strength as measured by increased METs and functional capacity ( )       Able to understand and use rate of perceived exertion (RPE) scale Yes       Intervention Provide education and explanation on how to use RPE scale       Expected Outcomes Short Term: Able to use RPE daily in rehab to express subjective intensity level;Long Term:  Able to use RPE to guide intensity level when exercising independently       Knowledge and understanding of Target Heart Rate Range (THRR) Yes       Intervention Provide education and explanation of THRR including how the numbers were predicted and where they are located for reference       Expected Outcomes Short Term: Able to state/look up THRR;Long Term: Able to use THRR to govern intensity when exercising independently;Short Term: Able to use daily as guideline for intensity in rehab       Able to check pulse independently Yes        Intervention Provide education and demonstration on how to check pulse in carotid and radial arteries.;Review the importance of being able to check your own pulse for safety during independent exercise       Expected Outcomes Short Term: Able to explain why pulse checking is important during independent exercise;Long Term: Able to check pulse independently and accurately       Understanding of Exercise Prescription Yes       Intervention Provide education, explanation, and written materials on patient's individual exercise prescription       Expected Outcomes Short Term: Able to explain program exercise prescription;Long Term: Able to explain home exercise prescription to exercise independently                Exercise Goals Re-Evaluation :  Exercise Goals Re-Evaluation     Row Name 06/03/22 1130 06/17/22 1103           Exercise Goal Re-Evaluation   Exercise Goals Review Increase Physical Activity;Able to understand and use rate of perceived exertion (RPE) scale;Increase Strength and Stamina Increase Physical Activity;Able to understand and use rate of perceived exertion (RPE) scale;Increase Strength and Stamina;Knowledge and understanding of Target Heart Rate Range (THRR);Able to check pulse independently      Comments Patient able to understand and use RPE scale approriately. Patient is making good progress with exercise. Patient is walking outside or on his treadmill at home 15 minutes daily and using his 8 lb shand weights. Patient's goal is to get back to exercise at the gym at the levels he was previously exercising at.      Expected Outcomes Progress workloads as tolerated to help increase strength and stamina. Continue to progress workloads as tolerated to achieve previous exercise workloads.               Discharge Exercise Prescription (Final Exercise Prescription Changes):  Exercise Prescription Changes - 06/17/22 1026       Response to Exercise   Blood Pressure (Admit)  100/74    Blood Pressure (Exercise) 122/80    Blood Pressure (Exit) 92/64    Heart Rate (Admit) 74 bpm    Heart Rate (Exercise) 111 bpm  Heart Rate (Exit) 77 bpm    Rating of Perceived Exertion (Exercise) 11    Symptoms None    Duration Continue with 30 min of aerobic exercise without signs/symptoms of physical distress.    Intensity THRR unchanged      Progression   Progression Continue to progress workloads to maintain intensity without signs/symptoms of physical distress.    Average METs 2.9      Resistance Training   Training Prescription Yes    Weight 6 lbs    Reps 10-15    Time 10 Minutes      Interval Training   Interval Training No      Treadmill   MPH 2    Grade 3    Minutes 15    METs 3.36      NuStep   Level 3    SPM 78    Minutes 15    METs 2.4             Nutrition:  Target Goals: Understanding of nutrition guidelines, daily intake of sodium 1500mg , cholesterol 200mg , calories 30% from fat and 7% or less from saturated fats, daily to have 5 or more servings of fruits and vegetables.  Biometrics:  Pre Biometrics - 05/28/22 0740       Pre Biometrics   Waist Circumference 39.5 inches    Hip Circumference 39.75 inches    Waist to Hip Ratio 0.99 %    Triceps Skinfold 7 mm    % Body Fat 24.4 %    Grip Strength 40 kg    Flexibility 12.75 in    Single Leg Stand 14.79 seconds              Nutrition Therapy Plan and Nutrition Goals:  Nutrition Therapy & Goals - 06/03/22 1031       Nutrition Therapy   Diet Heart Healthy Diet    Drug/Food Interactions Statins/Certain Fruits      Personal Nutrition Goals   Nutrition Goal Patient to identify strategies for reducing cardiovascular risk by attending the Pritikin education and nutrition series weekly.    Personal Goal #2 Patient to improve diet quality by using the plate method as a guide for meal planning to include lean protein/plant protein, fruits, vegetables, whole grains, nonfat dairy  as part of a well-balanced diet.    Personal Goal #3 Patient to identify food sources and limit daily intake of saturated fat, trans fat, sodium, and refined carbohydrates.    Comments Mr. Mikami will benefit from participation in intensive cardiac rehab for nutrition, exercise, and lifestyle modification.      Intervention Plan   Intervention Nutrition handout(s) given to patient.;Prescribe, educate and counsel regarding individualized specific dietary modifications aiming towards targeted core components such as weight, hypertension, lipid management, diabetes, heart failure and other comorbidities.    Expected Outcomes Short Term Goal: Understand basic principles of dietary content, such as calories, fat, sodium, cholesterol and nutrients.;Long Term Goal: Adherence to prescribed nutrition plan.             Nutrition Assessments:  Nutrition Assessments - 06/03/22 1029       Rate Your Plate Scores   Pre Score 64            MEDIFICTS Score Key: ?70 Need to make dietary changes  40-70 Heart Healthy Diet ? 40 Therapeutic Level Cholesterol Diet   Flowsheet Row INTENSIVE CARDIAC REHAB from 06/03/2022 in [facility] for Heart, Vascular, & Lung Health  Picture Your Plate Total Score on Admission 64      Picture Your Plate Scores: <81 Unhealthy dietary pattern with much room for improvement. 41-50 Dietary pattern unlikely to meet recommendations for good health and room for improvement. 51-60 More healthful dietary pattern, with some room for improvement.  >60 Healthy dietary pattern, although there may be some specific behaviors that could be improved.    Nutrition Goals Re-Evaluation:  Nutrition Goals Re-Evaluation     Row Name 06/03/22 1031             Goals   Current Weight 184 lb 8.4 oz (83.7 kg)       Comment Cr 1.45, GFR 53, Lipoprotein A 97.4, cholesterol 210, triglycerides 239, LDL 121, A1c 6.2       Expected Outcome Mr. Donahoe will  benefit from participation in intensive cardiac rehab for nutrition, exercise, and lifestyle modification.                Nutrition Goals Re-Evaluation:  Nutrition Goals Re-Evaluation     Row Name 06/03/22 1031             Goals   Current Weight 184 lb 8.4 oz (83.7 kg)       Comment Cr 1.45, GFR 53, Lipoprotein A 97.4, cholesterol 210, triglycerides 239, LDL 121, A1c 6.2       Expected Outcome Mr. Hewes will benefit from participation in intensive cardiac rehab for nutrition, exercise, and lifestyle modification.                Nutrition Goals Discharge (Final Nutrition Goals Re-Evaluation):  Nutrition Goals Re-Evaluation - 06/03/22 1031       Goals   Current Weight 184 lb 8.4 oz (83.7 kg)    Comment Cr 1.45, GFR 53, Lipoprotein A 97.4, cholesterol 210, triglycerides 239, LDL 121, A1c 6.2    Expected Outcome Mr. Kitner will benefit from participation in intensive cardiac rehab for nutrition, exercise, and lifestyle modification.             Psychosocial: Target Goals: Acknowledge presence or absence of significant depression and/or stress, maximize coping skills, provide positive support system. Participant is able to verbalize types and ability to use techniques and skills needed for reducing stress and depression.  Initial Review & Psychosocial Screening:  Initial Psych Review & Screening - 05/28/22 0956       Initial Review   Current issues with None Identified      Family Dynamics   Good Support System? Yes    Comments Patient has support from his wife and children.      Barriers   Psychosocial barriers to participate in program There are no identifiable barriers or psychosocial needs.             Quality of Life Scores:  Quality of Life - 05/28/22 0951       Quality of Life   Select Quality of Life      Quality of Life Scores   Health/Function Pre 24.8 %    Socioeconomic Pre 25.8 %    Psych/Spiritual Pre 24.86 %    Family Pre 28.8 %     GLOBAL Pre 25.59 %            Scores of 19 and below usually indicate a poorer quality of life in these areas.  A difference of  2-3 points is a clinically meaningful difference.  A difference of 2-3 points in the total score of the Quality of  Life Index has been associated with significant improvement in overall quality of life, self-image, physical symptoms, and general health in studies assessing change in quality of life.  PHQ-9: Review Flowsheet       05/28/2022  Depression screen PHQ 2/9  Decreased Interest 0  Down, Depressed, Hopeless 0  PHQ - 2 Score 0  Altered sleeping 0  Tired, decreased energy 0  Change in appetite 0  Feeling bad or failure about yourself  0  Trouble concentrating 0  Moving slowly or fidgety/restless 0  Suicidal thoughts 0  PHQ-9 Score 0   Interpretation of Total Score  Total Score Depression Severity:  1-4 = Minimal depression, 5-9 = Mild depression, 10-14 = Moderate depression, 15-19 = Moderately severe depression, 20-27 = Severe depression   Psychosocial Evaluation and Intervention:   Psychosocial Re-Evaluation:  Psychosocial Re-Evaluation     Row Name 06/03/22 1659 06/13/22 1241           Psychosocial Re-Evaluation   Current issues with None Identified None Identified      Interventions Encouraged to attend Cardiac Rehabilitation for the exercise Encouraged to attend Cardiac Rehabilitation for the exercise      Continue Psychosocial Services  No Follow up required No Follow up required               Psychosocial Discharge (Final Psychosocial Re-Evaluation):  Psychosocial Re-Evaluation - 06/13/22 1241       Psychosocial Re-Evaluation   Current issues with None Identified    Interventions Encouraged to attend Cardiac Rehabilitation for the exercise    Continue Psychosocial Services  No Follow up required             Vocational Rehabilitation: Provide vocational rehab assistance to qualifying candidates.    Vocational Rehab Evaluation & Intervention:  Vocational Rehab - 05/28/22 1610       Initial Vocational Rehab Evaluation & Intervention   Assessment shows need for Vocational Rehabilitation No   Patient is retired. No vocational rehab needs.     Vocational Rehab Re-Evaulation   Comments --             Education: Education Goals: Education classes will be provided on a weekly basis, covering required topics. Participant will state understanding/return demonstration of topics presented.    Education     Row Name 06/03/22 1300     Education   Cardiac Education Topics Pritikin   Geographical information systems officer Exercise   Exercise Workshop Exercise Basics: Building Your Action Plan   Instruction Review Code 1- Verbalizes Understanding   Class Start Time 1150   Class Stop Time 1235   Class Time Calculation (min) 45 min    Row Name 06/12/22 1300     Education   Cardiac Education Topics Pritikin   Customer service manager   Weekly Topic Efficiency Cooking - Meals in a Snap   Instruction Review Code 1- Verbalizes Understanding   Class Start Time 1140   Class Stop Time 1220   Class Time Calculation (min) 40 min    Row Name 06/14/22 1400     Education   Cardiac Education Topics Pritikin   Psychologist, forensic Exercise Education   Exercise Education Move It!   Instruction Review Code 1- Verbalizes Understanding   Class Start Time 1150  Class Stop Time 1230   Class Time Calculation (min) 40 min            Core Videos: Exercise    Move It!  Clinical staff conducted group or individual video education with verbal and written material and guidebook.  Patient learns the recommended Pritikin exercise program. Exercise with the goal of living a long, healthy life. Some of the health benefits of exercise include controlled  diabetes, healthier blood pressure levels, improved cholesterol levels, improved heart and lung capacity, improved sleep, and better body composition. Everyone should speak with their doctor before starting or changing an exercise routine.  Biomechanical Limitations Clinical staff conducted group or individual video education with verbal and written material and guidebook.  Patient learns how biomechanical limitations can impact exercise and how we can mitigate and possibly overcome limitations to have an impactful and balanced exercise routine.  Body Composition Clinical staff conducted group or individual video education with verbal and written material and guidebook.  Patient learns that body composition (ratio of muscle mass to fat mass) is a key component to assessing overall fitness, rather than body weight alone. Increased fat mass, especially visceral belly fat, can put [place] at increased risk for metabolic syndrome, type 2 diabetes, heart disease, and even death. It is recommended to combine diet and exercise (cardiovascular and resistance training) to improve your body composition. Seek guidance from your physician and exercise physiologist before implementing an exercise routine.  Exercise Action Plan Clinical staff conducted group or individual video education with verbal and written material and guidebook.  Patient learns the recommended strategies to achieve and enjoy long-term exercise adherence, including variety, self-motivation, self-efficacy, and positive decision making. Benefits of exercise include fitness, good health, weight management, more energy, better sleep, less stress, and overall well-being.  Medical   Heart Disease Risk Reduction Clinical staff conducted group or individual video education with verbal and written material and guidebook.  Patient learns our heart is our most vital organ as it circulates oxygen, nutrients, white blood cells, and hormones throughout the  entire body, and carries waste away. Data supports a plant-based eating plan like the Pritikin Program for its effectiveness in slowing progression of and reversing heart disease. The video provides a number of recommendations to address heart disease.   Metabolic Syndrome and Belly Fat  Clinical staff conducted group or individual video education with verbal and written material and guidebook.  Patient learns what metabolic syndrome is, how it leads to heart disease, and how one can reverse it and keep it from coming back. You have metabolic syndrome if you have 3 of the following 5 criteria: abdominal obesity, high blood pressure, high triglycerides, low HDL cholesterol, and high blood sugar.  Hypertension and Heart Disease Clinical staff conducted group or individual video education with verbal and written material and guidebook.  Patient learns that high blood pressure, or hypertension, is very common in the [place]. Hypertension is largely due to excessive salt intake, but other important risk factors include being overweight, physical inactivity, drinking too much alcohol, smoking, and not eating enough potassium from fruits and vegetables. High blood pressure is a leading risk factor for heart attack, stroke, congestive heart failure, dementia, kidney failure, and premature death. Long-term effects of excessive salt intake include stiffening of the arteries and thickening of heart muscle and organ damage. Recommendations include ways to reduce hypertension and the risk of heart disease.  Diseases of Our Time - Focusing on Diabetes Clinical staff conducted group or  individual video education with verbal and written material and guidebook.  Patient learns why the best way to stop diseases of our time is prevention, through food and other lifestyle changes. Medicine (such as prescription pills and surgeries) is often only a Band-Aid on the problem, not a long-term solution. Most common diseases  of our time include obesity, type 2 diabetes, hypertension, heart disease, and cancer. The Pritikin Program is recommended and has been proven to help reduce, reverse, and/or prevent the damaging effects of metabolic syndrome.  Nutrition   Overview of the Pritikin Eating Plan  Clinical staff conducted group or individual video education with verbal and written material and guidebook.  Patient learns about the Pritikin Eating Plan for disease risk reduction. The Pritikin Eating Plan emphasizes a wide variety of unrefined, minimally-processed carbohydrates, like fruits, vegetables, whole grains, and legumes. Go, Caution, and Stop food choices are explained. Plant-based and lean animal proteins are emphasized. Rationale provided for low sodium intake for blood pressure control, low added sugars for blood sugar stabilization, and low added fats and oils for coronary artery disease risk reduction and weight management.  Calorie Density  Clinical staff conducted group or individual video education with verbal and written material and guidebook.  Patient learns about calorie density and how it impacts the Pritikin Eating Plan. Knowing the characteristics of the food you choose will help you decide whether those foods will lead to weight gain or weight loss, and whether you want to consume more or less of them. Weight loss is usually a side effect of the Pritikin Eating Plan because of its focus on low calorie-dense foods.  Label Reading  Clinical staff conducted group or individual video education with verbal and written material and guidebook.  Patient learns about the Pritikin recommended label reading guidelines and corresponding recommendations regarding calorie density, added sugars, sodium content, and whole grains.  Dining Out - Part 1  Clinical staff conducted group or individual video education with verbal and written material and guidebook.  Patient learns that restaurant meals can be sabotaging  because they can be so high in calories, fat, sodium, and/or sugar. Patient learns recommended strategies on how to positively address this and avoid unhealthy pitfalls.  Facts on Fats  Clinical staff conducted group or individual video education with verbal and written material and guidebook.  Patient learns that lifestyle modifications can be just as effective, if not more so, as many medications for lowering your risk of heart disease. A Pritikin lifestyle can help to reduce your risk of inflammation and atherosclerosis (cholesterol build-up, or plaque, in the artery walls). Lifestyle interventions such as dietary choices and physical activity address the cause of atherosclerosis. A review of the types of fats and their impact on blood cholesterol levels, along with dietary recommendations to reduce fat intake is also included.  Nutrition Action Plan  Clinical staff conducted group or individual video education with verbal and written material and guidebook.  Patient learns how to incorporate Pritikin recommendations into their lifestyle. Recommendations include planning and keeping personal health goals in mind as an important part of their success.  Healthy Mind-Set    Healthy Minds, Bodies, Hearts  Clinical staff conducted group or individual video education with verbal and written material and guidebook.  Patient learns how to identify when they are stressed. Video will discuss the impact of that stress, as well as the many benefits of stress management. Patient will also be introduced to stress management techniques. The way we think, act, and  feel has an impact on our hearts.  How Our Thoughts Can Heal Our Hearts  Clinical staff conducted group or individual video education with verbal and written material and guidebook.  Patient learns that negative thoughts can cause depression and anxiety. This can result in negative lifestyle behavior and serious health problems. Cognitive behavioral  therapy is an effective method to help control our thoughts in order to change and improve our emotional outlook.  Additional Videos:  Exercise    Improving Performance  Clinical staff conducted group or individual video education with verbal and written material and guidebook.  Patient learns to use a non-linear approach by alternating intensity levels and lengths of time spent exercising to help burn more calories and lose more body fat. Cardiovascular exercise helps improve heart health, metabolism, hormonal balance, blood sugar control, and recovery from fatigue. Resistance training improves strength, endurance, balance, coordination, reaction time, metabolism, and muscle mass. Flexibility exercise improves circulation, posture, and balance. Seek guidance from your physician and exercise physiologist before implementing an exercise routine and learn your capabilities and proper form for all exercise.  Introduction to Yoga  Clinical staff conducted group or individual video education with verbal and written material and guidebook.  Patient learns about yoga, a discipline of the coming together of mind, breath, and body. The benefits of yoga include improved flexibility, improved range of motion, better posture and core strength, increased lung function, weight loss, and positive self-image. Yoga's heart health benefits include lowered blood pressure, healthier heart rate, decreased cholesterol and triglyceride levels, improved immune function, and reduced stress. Seek guidance from your physician and exercise physiologist before implementing an exercise routine and learn your capabilities and proper form for all exercise.  Medical   Aging: Enhancing Your Quality of Life  Clinical staff conducted group or individual video education with verbal and written material and guidebook.  Patient learns key strategies and recommendations to stay in good physical health and enhance quality of life, such as  prevention strategies, having an advocate, securing a Health Care Proxy and Power of Attorney, and keeping a list of medications and system for tracking them. It also discusses how to avoid risk for bone loss.  Biology of Weight Control  Clinical staff conducted group or individual video education with verbal and written material and guidebook.  Patient learns that weight gain occurs because we consume more calories than we burn (eating more, moving less). Even if your body weight is normal, you may have higher ratios of fat compared to muscle mass. Too much body fat puts you at increased risk for cardiovascular disease, heart attack, stroke, type 2 diabetes, and obesity-related cancers. In addition to exercise, following the Pritikin Eating Plan can help reduce your risk.  Decoding Lab Results  Clinical staff conducted group or individual video education with verbal and written material and guidebook.  Patient learns that lab test reflects one measurement whose values change over time and are influenced by many factors, including medication, stress, sleep, exercise, food, hydration, pre-existing medical conditions, and more. It is recommended to use the knowledge from this video to become more involved with your lab results and evaluate your numbers to speak with your doctor.   Diseases of Our Time - Overview  Clinical staff conducted group or individual video education with verbal and written material and guidebook.  Patient learns that according to the CDC, 50% to 70% of chronic diseases (such as obesity, type 2 diabetes, elevated lipids, hypertension, and heart disease) are avoidable through  lifestyle improvements including healthier food choices, listening to satiety cues, and increased physical activity.  Sleep Disorders Clinical staff conducted group or individual video education with verbal and written material and guidebook.  Patient learns how good quality and duration of sleep are  important to overall health and well-being. Patient also learns about sleep disorders and how they impact health along with recommendations to address them, including discussing with a physician.  Nutrition  Dining Out - Part 2 Clinical staff conducted group or individual video education with verbal and written material and guidebook.  Patient learns how to plan ahead and communicate in order to maximize their dining experience in a healthy and nutritious manner. Included are recommended food choices based on the type of restaurant the patient is visiting.   Fueling a Banker conducted group or individual video education with verbal and written material and guidebook.  There is a strong connection between our food choices and our health. Diseases like obesity and type 2 diabetes are very prevalent and are in large-part due to lifestyle choices. The Pritikin Eating Plan provides plenty of food and hunger-curbing satisfaction. It is easy to follow, affordable, and helps reduce health risks.  Menu Workshop  Clinical staff conducted group or individual video education with verbal and written material and guidebook.  Patient learns that restaurant meals can sabotage health goals because they are often packed with calories, fat, sodium, and sugar. Recommendations include strategies to plan ahead and to communicate with the manager, chef, or server to help order a healthier meal.  Planning Your Eating Strategy  Clinical staff conducted group or individual video education with verbal and written material and guidebook.  Patient learns about the Pritikin Eating Plan and its benefit of reducing the risk of disease. The Pritikin Eating Plan does not focus on calories. Instead, it emphasizes high-quality, nutrient-rich foods. By knowing the characteristics of the foods, we choose, we can determine their calorie density and make informed decisions.  Targeting Your Nutrition Priorities   Clinical staff conducted group or individual video education with verbal and written material and guidebook.  Patient learns that lifestyle habits have a tremendous impact on disease risk and progression. This video provides eating and physical activity recommendations based on your personal health goals, such as reducing LDL cholesterol, losing weight, preventing or controlling type 2 diabetes, and reducing high blood pressure.  Vitamins and Minerals  Clinical staff conducted group or individual video education with verbal and written material and guidebook.  Patient learns different ways to obtain key vitamins and minerals, including through a recommended healthy diet. It is important to discuss all supplements you take with your doctor.   Healthy Mind-Set    Smoking Cessation  Clinical staff conducted group or individual video education with verbal and written material and guidebook.  Patient learns that cigarette smoking and tobacco addiction pose a serious health risk which affects millions of people. Stopping smoking will significantly reduce the risk of heart disease, lung disease, and many forms of cancer. Recommended strategies for quitting are covered, including working with your doctor to develop a successful plan.  Culinary   Becoming a Set designer conducted group or individual video education with verbal and written material and guidebook.  Patient learns that cooking at home can be healthy, cost-effective, quick, and puts them in control. Keys to cooking healthy recipes will include looking at your recipe, assessing your equipment needs, planning ahead, making it simple, choosing cost-effective seasonal ingredients,  and limiting the use of added fats, salts, and sugars.  Cooking - Breakfast and Snacks  Clinical staff conducted group or individual video education with verbal and written material and guidebook.  Patient learns how important breakfast is to satiety  and nutrition through the entire day. Recommendations include key foods to eat during breakfast to help stabilize blood sugar levels and to prevent overeating at meals later in the day. Planning ahead is also a key component.  Cooking - Educational psychologist conducted group or individual video education with verbal and written material and guidebook.  Patient learns eating strategies to improve overall health, including an approach to cook more at home. Recommendations include thinking of animal protein as a side on your plate rather than center stage and focusing instead on lower calorie dense options like vegetables, fruits, whole grains, and plant-based proteins, such as beans. Making sauces in large quantities to freeze for later and leaving the skin on your vegetables are also recommended to maximize your experience.  Cooking - Healthy Salads and Dressing Clinical staff conducted group or individual video education with verbal and written material and guidebook.  Patient learns that vegetables, fruits, whole grains, and legumes are the foundations of the Pritikin Eating Plan. Recommendations include how to incorporate each of these in flavorful and healthy salads, and how to create homemade salad dressings. Proper handling of ingredients is also covered. Cooking - Soups and [employer] - Soups and Desserts Clinical staff conducted group or individual video education with verbal and written material and guidebook.  Patient learns that Pritikin soups and desserts make for easy, nutritious, and delicious snacks and meal components that are low in sodium, fat, sugar, and calorie density, while high in vitamins, minerals, and filling fiber. Recommendations include simple and healthy ideas for soups and desserts.   Overview     The Pritikin Solution Program Overview Clinical staff conducted group or individual video education with verbal and written material and guidebook.  Patient  learns that the results of the Pritikin Program have been documented in more than 100 articles published in peer-reviewed journals, and the benefits include reducing risk factors for (and, in some cases, even reversing) high cholesterol, high blood pressure, type 2 diabetes, obesity, and more! An overview of the three key pillars of the Pritikin Program will be covered: eating well, doing regular exercise, and having a healthy mind-set.  WORKSHOPS  Exercise: Exercise Basics: Building Your Action Plan Clinical staff led group instruction and group discussion with PowerPoint presentation and patient guidebook. To enhance the learning environment the use of posters, models and videos may be added. At the conclusion of this workshop, patients will comprehend the difference between physical activity and exercise, as well as the benefits of incorporating both, into their routine. Patients will understand the FITT (Frequency, Intensity, Time, and Type) principle and how to use it to build an exercise action plan. In addition, safety concerns and other considerations for exercise and cardiac rehab will be addressed by the presenter. The purpose of this lesson is to promote a comprehensive and effective weekly exercise routine in order to improve patients' overall level of fitness.   Managing Heart Disease: Your Path to a Healthier Heart Clinical staff led group instruction and group discussion with PowerPoint presentation and patient guidebook. To enhance the learning environment the use of posters, models and videos may be added.At the conclusion of this workshop, patients will understand the anatomy and physiology of the heart. Additionally,  they will understand how Pritikin's three pillars impact the risk factors, the progression, and the management of heart disease.  The purpose of this lesson is to provide a high-level overview of the heart, heart disease, and how the Pritikin lifestyle positively  impacts risk factors.  Exercise Biomechanics Clinical staff led group instruction and group discussion with PowerPoint presentation and patient guidebook. To enhance the learning environment the use of posters, models and videos may be added. Patients will learn how the structural parts of their bodies function and how these functions impact their daily activities, movement, and exercise. Patients will learn how to promote a neutral spine, learn how to manage pain, and identify ways to improve their physical movement in order to promote healthy living. The purpose of this lesson is to expose patients to common physical limitations that impact physical activity. Participants will learn practical ways to adapt and manage aches and pains, and to minimize their effect on regular exercise. Patients will learn how to maintain good posture while sitting, walking, and lifting.  Balance Training and Fall Prevention  Clinical staff led group instruction and group discussion with PowerPoint presentation and patient guidebook. To enhance the learning environment the use of posters, models and videos may be added. At the conclusion of this workshop, patients will understand the importance of their sensorimotor skills (vision, proprioception, and the vestibular system) in maintaining their ability to balance as they age. Patients will apply a variety of balancing exercises that are appropriate for their current level of function. Patients will understand the common causes for poor balance, possible solutions to these problems, and ways to modify their physical environment in order to minimize their fall risk. The purpose of this lesson is to teach patients about the importance of maintaining balance as they age and ways to minimize their risk of falling.  WORKSHOPS   Nutrition:  Fueling a Ship broker led group instruction and group discussion with PowerPoint presentation and patient  guidebook. To enhance the learning environment the use of posters, models and videos may be added. Patients will review the foundational principles of the Pritikin Eating Plan and understand what constitutes a serving size in each of the food groups. Patients will also learn Pritikin-friendly foods that are better choices when away from home and review make-ahead meal and snack options. Calorie density will be reviewed and applied to three nutrition priorities: weight maintenance, weight loss, and weight gain. The purpose of this lesson is to reinforce (in a group setting) the key concepts around what patients are recommended to eat and how to apply these guidelines when away from home by planning and selecting Pritikin-friendly options. Patients will understand how calorie density may be adjusted for different weight management goals.  Mindful Eating  Clinical staff led group instruction and group discussion with PowerPoint presentation and patient guidebook. To enhance the learning environment the use of posters, models and videos may be added. Patients will briefly review the concepts of the Pritikin Eating Plan and the importance of low-calorie dense foods. The concept of mindful eating will be introduced as well as the importance of paying attention to internal hunger signals. Triggers for non-hunger eating and techniques for dealing with triggers will be explored. The purpose of this lesson is to provide patients with the opportunity to review the basic principles of the Pritikin Eating Plan, discuss the value of eating mindfully and how to measure internal cues of hunger and fullness using the Hunger Scale. Patients will also discuss  reasons for non-hunger eating and learn strategies to use for controlling emotional eating.  Targeting Your Nutrition Priorities Clinical staff led group instruction and group discussion with PowerPoint presentation and patient guidebook. To enhance the learning  environment the use of posters, models and videos may be added. Patients will learn how to determine their genetic susceptibility to disease by reviewing their family history. Patients will gain insight into the importance of diet as part of an overall healthy lifestyle in mitigating the impact of genetics and other environmental insults. The purpose of this lesson is to provide patients with the opportunity to assess their personal nutrition priorities by looking at their family history, their own health history and current risk factors. Patients will also be able to discuss ways of prioritizing and modifying the Pritikin Eating Plan for their highest risk areas  Menu  Clinical staff led group instruction and group discussion with PowerPoint presentation and patient guidebook. To enhance the learning environment the use of posters, models and videos may be added. Using menus brought in from [employer], or printed from [employer], patients will apply the Pritikin dining out guidelines that were presented in the [employer] video. Patients will also be able to practice these guidelines in a variety of provided scenarios. The purpose of this lesson is to provide patients with the opportunity to practice hands-on learning of the Pritikin Dining Out guidelines with actual menus and practice scenarios.  Label Reading Clinical staff led group instruction and group discussion with PowerPoint presentation and patient guidebook. To enhance the learning environment the use of posters, models and videos may be added. Patients will review and discuss the Pritikin label reading guidelines presented in Pritikin's Label Reading Educational series video. Using fool labels brought in from local grocery stores and markets, patients will apply the label reading guidelines and determine if the packaged food meet the Pritikin guidelines. The purpose of this lesson is to provide patients with the  opportunity to review, discuss, and practice hands-on learning of the Pritikin Label Reading guidelines with actual packaged food labels. Cooking School  Pritikin's [employer] are designed to teach patients ways to prepare quick, simple, and affordable recipes at home. The importance of nutrition's role in chronic disease risk reduction is reflected in its emphasis in the overall Pritikin program. By learning how to prepare essential core Pritikin Eating Plan recipes, patients will increase control over what they eat; be able to customize the flavor of foods without the use of added salt, sugar, or fat; and improve the quality of the food they consume. By learning a set of core recipes which are easily assembled, quickly prepared, and affordable, patients are more likely to prepare more healthy foods at home. These workshops focus on convenient breakfasts, simple entres, side dishes, and desserts which can be prepared with minimal effort and are consistent with nutrition recommendations for cardiovascular risk reduction. Cooking [employer] are taught by a Armed forces logistics/support/administrative officer (RD) who has been trained by the [employer]. The chef or RD has a clear understanding of the importance of minimizing - if not completely eliminating - added fat, sugar, and sodium in recipes. Throughout the series of Cooking School Workshop sessions, patients will learn about healthy ingredients and efficient methods of cooking to build confidence in their capability to prepare    Cooking School weekly topics:  Adding Flavor- Sodium-Free  Fast and Healthy Breakfasts  Powerhouse Plant-Based Proteins  Satisfying Salads and Dressings  Simple Sides and Sauces  International Cuisine-Spotlight on the [employer] Zones  Delicious Desserts  Savory Soups  [employer] - Meals in a Snap  Tasty Appetizers and Snacks  Comforting Weekend Breakfasts  One-Pot Wonders   Fast Evening Meals  Biomedical scientist Your Pritikin Plate  WORKSHOPS   Healthy Mindset (Psychosocial):  Focused Goals, Sustainable Changes Clinical staff led group instruction and group discussion with PowerPoint presentation and patient guidebook. To enhance the learning environment the use of posters, models and videos may be added. Patients will be able to apply effective goal setting strategies to establish at least one personal goal, and then take consistent, meaningful action toward that goal. They will learn to identify common barriers to achieving personal goals and develop strategies to overcome them. Patients will also gain an understanding of how our mind-set can impact our ability to achieve goals and the importance of cultivating a positive and growth-oriented mind-set. The purpose of this lesson is to provide patients with a deeper understanding of how to set and achieve personal goals, as well as the tools and strategies needed to overcome common obstacles which may arise along the way.  From Head to Heart: The Power of a Healthy Outlook  Clinical staff led group instruction and group discussion with PowerPoint presentation and patient guidebook. To enhance the learning environment the use of posters, models and videos may be added. Patients will be able to recognize and describe the impact of emotions and mood on physical health. They will discover the importance of self-care and explore self-care practices which may work for them. Patients will also learn how to utilize the 4 C's to cultivate a healthier outlook and better manage stress and challenges. The purpose of this lesson is to demonstrate to patients how a healthy outlook is an essential part of maintaining good health, especially as they continue their cardiac rehab journey.  Healthy Sleep for a Healthy Heart Clinical staff led group instruction and group discussion with PowerPoint presentation and patient guidebook. To enhance the  learning environment the use of posters, models and videos may be added. At the conclusion of this workshop, patients will be able to demonstrate knowledge of the importance of sleep to overall health, well-being, and quality of life. They will understand the symptoms of, and treatments for, common sleep disorders. Patients will also be able to identify daytime and nighttime behaviors which impact sleep, and they will be able to apply these tools to help manage sleep-related challenges. The purpose of this lesson is to provide patients with a general overview of sleep and outline the importance of quality sleep. Patients will learn about a few of the most common sleep disorders. Patients will also be introduced to the concept of "sleep hygiene," and discover ways to self-manage certain sleeping problems through simple daily behavior changes. Finally, the workshop will motivate patients by clarifying the links between quality sleep and their goals of heart-healthy living.   Recognizing and Reducing Stress Clinical staff led group instruction and group discussion with PowerPoint presentation and patient guidebook. To enhance the learning environment the use of posters, models and videos may be added. At the conclusion of this workshop, patients will be able to understand the types of stress reactions, differentiate between acute and chronic stress, and recognize the impact that chronic stress has on their health. They will also be able to apply different coping mechanisms, such as reframing negative self-talk. Patients will have the opportunity to practice a variety of  stress management techniques, such as deep abdominal breathing, progressive muscle relaxation, and/or guided imagery.  The purpose of this lesson is to educate patients on the role of stress in their lives and to provide healthy techniques for coping with it.  Learning Barriers/Preferences:  Learning Barriers/Preferences - 05/28/22 4098        Learning Barriers/Preferences   Learning Barriers None    Learning Preferences None             Education Topics:  Knowledge Questionnaire Score:  Knowledge Questionnaire Score - 05/28/22 0812       Knowledge Questionnaire Score   Pre Score 19/24             Core Components/Risk Factors/Patient Goals at Admission:  Personal Goals and Risk Factors at Admission - 05/28/22 0951       Core Components/Risk Factors/Patient Goals on Admission   Lipids Yes    Intervention Provide education and support for participant on nutrition & aerobic/resistive exercise along with prescribed medications to achieve LDL 70mg , HDL >40mg .    Expected Outcomes Short Term: Participant states understanding of desired cholesterol values and is compliant with medications prescribed. Participant is following exercise prescription and nutrition guidelines.;Long Term: Cholesterol controlled with medications as prescribed, with individualized exercise RX and with personalized nutrition plan. Value goals: LDL < 70mg , HDL > 40 mg.             Core Components/Risk Factors/Patient Goals Review:   Goals and Risk Factor Review     Row Name 06/03/22 1659 06/13/22 1249           Core Components/Risk Factors/Patient Goals Review   Personal Goals Review Lipids Lipids;Heart Failure      Review Hideo started intensive cardiac rehab on 06/03/22. Krishna did well with exercise, vital signs were stable Darden started intensive cardiac rehab on 06/03/22. Tavius is off to a good start to  exercise, vital signs have been stable.      Expected Outcomes Mayfield will continue to participate in intensive cardiac rehab for exercise, nutrition and lifestyle modificaitons Alyxander will continue to participate in intensive cardiac rehab for exercise, nutrition and lifestyle modificaitons               Core Components/Risk Factors/Patient Goals at Discharge (Final Review):   Goals and Risk Factor Review - 06/13/22  1249       Core Components/Risk Factors/Patient Goals Review   Personal Goals Review Lipids;Heart Failure    Review Burnis started intensive cardiac rehab on 06/03/22. Hiram is off to a good start to  exercise, vital signs have been stable.    Expected Outcomes Sheehan will continue to participate in intensive cardiac rehab for exercise, nutrition and lifestyle modificaitons             ITP Comments:  ITP Comments     Row Name 05/28/22 0740 06/03/22 1656 06/13/22 1240       ITP Comments Medical Director- Dr. Armanda Magic, MD. Oriented patient to [facility] Education Program/ Intensive Cardiac Rehab program. Reviewed orientation packet with patient. 30 Day ITP Review. Okechukwu started intensive cardiac on 06/03/22 and did well with exercise. 30 Day ITP Review. Imari started intensive cardiac on 06/03/22 and is off to a good start to exercise.              Comments: See ITP Comments

## 2022-06-19 ENCOUNTER — Encounter (HOSPITAL_COMMUNITY)
Admission: RE | Admit: 2022-06-19 | Discharge: 2022-06-19 | Disposition: A | Discharge status: Home / Self Care | Payer: Medicare Other | Source: Ambulatory Visit | Attending: Internal Medicine | Admitting: Internal Medicine

## 2022-06-19 DIAGNOSIS — Z951 Presence of aortocoronary bypass graft: Secondary | ICD-10-CM

## 2022-06-19 DIAGNOSIS — I213 ST elevation (STEMI) myocardial infarction of unspecified site: Secondary | ICD-10-CM

## 2022-06-21 ENCOUNTER — Encounter (HOSPITAL_COMMUNITY)
Admission: RE | Admit: 2022-06-21 | Discharge: 2022-06-21 | Disposition: A | Discharge status: Home / Self Care | Payer: Medicare Other | Source: Ambulatory Visit | Attending: Internal Medicine | Admitting: Internal Medicine

## 2022-06-21 ENCOUNTER — Other Ambulatory Visit (HOSPITAL_COMMUNITY): Payer: Self-pay | Admitting: Cardiology

## 2022-06-21 DIAGNOSIS — I213 ST elevation (STEMI) myocardial infarction of unspecified site: Secondary | ICD-10-CM

## 2022-06-21 DIAGNOSIS — Z951 Presence of aortocoronary bypass graft: Secondary | ICD-10-CM

## 2022-06-23 ENCOUNTER — Other Ambulatory Visit (HOSPITAL_COMMUNITY): Payer: Self-pay

## 2022-06-24 ENCOUNTER — Encounter (HOSPITAL_COMMUNITY)
Admission: RE | Admit: 2022-06-24 | Discharge: 2022-06-24 | Disposition: A | Discharge status: Home / Self Care | Payer: Medicare Other | Source: Ambulatory Visit | Attending: Internal Medicine | Admitting: Internal Medicine

## 2022-06-24 ENCOUNTER — Telehealth (HOSPITAL_COMMUNITY): Payer: Self-pay

## 2022-06-24 ENCOUNTER — Other Ambulatory Visit (HOSPITAL_COMMUNITY): Payer: Self-pay

## 2022-06-24 DIAGNOSIS — I213 ST elevation (STEMI) myocardial infarction of unspecified site: Secondary | ICD-10-CM

## 2022-06-24 DIAGNOSIS — Z951 Presence of aortocoronary bypass graft: Secondary | ICD-10-CM

## 2022-06-24 MED ORDER — ATORVASTATIN CALCIUM 80 MG PO TABS: 80.0000 mg | ORAL_TABLET | Freq: Every day | ORAL | 11 refills | Status: DC

## 2022-06-24 MED ORDER — ELIQUIS 5 MG PO TABS
5.0000 mg | ORAL_TABLET | Freq: Two times a day (BID) | ORAL | 3 refills | Status: DC
  Filled 2022-06-24: qty 180, 90d supply, fill #0

## 2022-06-24 NOTE — Telephone Encounter (Signed)
Patient's electronic med refill request has been sent to pharmacy.

## 2022-06-25 ENCOUNTER — Encounter: Payer: Self-pay | Admitting: Internal Medicine

## 2022-06-25 ENCOUNTER — Ambulatory Visit: Payer: Medicare Other | Attending: Internal Medicine | Admitting: Internal Medicine

## 2022-06-25 VITALS — BP 122/76 | HR 78 | Ht 67.0 in | Wt 187.4 lb

## 2022-06-25 DIAGNOSIS — I255 Ischemic cardiomyopathy: Secondary | ICD-10-CM | POA: Diagnosis not present

## 2022-06-25 DIAGNOSIS — I5020 Unspecified systolic (congestive) heart failure: Secondary | ICD-10-CM | POA: Diagnosis present

## 2022-06-25 DIAGNOSIS — Z01812 Encounter for preprocedural laboratory examination: Secondary | ICD-10-CM | POA: Diagnosis not present

## 2022-06-25 NOTE — Progress Notes (Signed)
ELECTROPHYSIOLOGY CONSULT NOTE  Patient ID: Juan Snow, MRN: [id number], DOB/AGE: [birth year] 66 y.o. Admit date: (Not on file) Date of Consult: 06/25/2022  Primary Physician: Jackelyn Poling, DO Primary Cardiologist: DM     Juan Snow is a 67 y.o. male who is being seen today for the evaluation of ICD at the request of DM.    HPI Juan Snow is a 67 y.o. male referred for consideration of an ICD with ischemic cardiomyopathy.   Presented 1/24 with a anterolateral MI incidentally found to have COVID.  Revascularization was not possible and he underwent bypass surgery with balloon pump support.  History of a submassive PE.  Which he understands is to be continued indefinitely.  Following discharge from hospital after his surgery he has continued to improve.  He has very little functional limitation.  Denies dyspnea orthopnea nocturnal dyspnea peripheral edema.  His wife concurs.  no syncope or presyncope   DATE TEST EF   3/23 Echo  50-55%   1/24 Echo   20-25 %   4/24 Echo  <20%         Date Cr K Hgb  4/24 1.53 4.5 13.1            Past Medical History:  Diagnosis Date   Arthritis    OA   Cough    WHITE SPUTUM OCC, NO FEVER COLD SYMPTOMS MAINLY   GERD (gastroesophageal reflux disease)       Surgical History:  Past Surgical History:  Procedure Laterality Date   CHOLECYSTECTOMY N/A 03/30/2021   Procedure: LAPAROSCOPIC CHOLECYSTECTOMY WITH INTRAOPERATIVE CHOLANGIOGRAM;  Surgeon: Darnell Level, MD;  Location: [facility];  Service: General;  Laterality: N/A;   COLONOSCOPY  06/17/2011   Procedure: COLONOSCOPY;  Surgeon: Charna Elizabeth, MD;  Location: [facility];  Service: Endoscopy;  Laterality: N/A;   CORONARY ARTERY BYPASS GRAFT N/A 03/12/2022   Procedure: CORONARY ARTERY BYPASS GRAFTING (CABG) X TWO BYPASSES USING LEFT INTERNAL MAMMARY ARTERY AND RIGHT GREATER SAPHENOUS  VEIN HARVEST.;  Surgeon: Corliss Skains, MD;  Location: [facility] OR;  Service: Open Heart  Surgery;  Laterality: N/A;   CORONARY/GRAFT ACUTE MI REVASCULARIZATION N/A 03/10/2022   Procedure: Coronary/Graft Acute MI Revascularization;  Surgeon: Orbie Pyo, MD;  Location: [facility] INVASIVE [facility];  Service: Cardiovascular;  Laterality: N/A;   IABP INSERTION N/A 03/10/2022   Procedure: IABP Insertion;  Surgeon: Orbie Pyo, MD;  Location: [facility];  Service: Cardiovascular;  Laterality: N/A;   IR ANGIOGRAM PULMONARY BILATERAL SELECTIVE  05/11/2021   IR ANGIOGRAM SELECTIVE EACH ADDITIONAL VESSEL  05/11/2021   IR ANGIOGRAM SELECTIVE EACH ADDITIONAL VESSEL  05/11/2021   IR RADIOLOGIST EVAL & MGMT  06/06/2021   IR RADIOLOGIST EVAL & MGMT  09/06/2021   IR RADIOLOGIST EVAL & MGMT  12/26/2021   IR THROMBECT VENO MECH MOD SED  05/11/2021   IR US GUIDE VASC ACCESS RIGHT  05/11/2021   KNEE SURGERY Right YRS AGO   MENISCUS   LEFT HEART CATH AND CORONARY ANGIOGRAPHY N/A 03/10/2022   Procedure: LEFT HEART CATH AND CORONARY ANGIOGRAPHY;  Surgeon: Orbie Pyo, MD;  Location: [facility];  Service: Cardiovascular;  Laterality: N/A;   LEFT HEART CATH AND CORONARY ANGIOGRAPHY N/A 03/10/2022   Procedure: LEFT HEART CATH AND CORONARY ANGIOGRAPHY;  Surgeon: Orbie Pyo, MD;  Location: [facility];  Service: Cardiovascular;  Laterality: N/A;   TEE WITHOUT CARDIOVERSION N/A 03/12/2022  Procedure: TRANSESOPHAGEAL ECHOCARDIOGRAM (TEE);  Surgeon: Corliss Skains, MD;  Location: [facility] OR;  Service: Open Heart Surgery;  Laterality: N/A;   THUMB ARTHROSCOPY Right YRS AGO   TOTAL HIP ARTHROPLASTY Left 06/25/2017   Procedure: LEFT TOTAL HIP ARTHROPLASTY ANTERIOR APPROACH;  Surgeon: Ollen Gross, MD;  Location: [facility];  Service: Orthopedics;  Laterality: Left;     Home Meds: Confirmed with patient          acetaminophen (TYLENOL) 500 MG tablet Take 500 mg by mouth every 6 (six) hours as needed.   apixaban (ELIQUIS) 5 MG TABS tablet Take 1 tablet (5 mg total) by mouth 2 (two) times  daily.   Ascorbic Acid (VITAMIN C PO) Take 1 tablet by mouth daily.   aspirin EC 81 MG tablet Take 1 tablet (81 mg total) by mouth daily. Swallow whole.   atorvastatin (LIPITOR) 80 MG tablet Take 1 tablet (80 mg total) by mouth daily.   Cholecalciferol (VITAMIN D-3 PO) Take 1 tablet by mouth daily.   Coenzyme Q10 (CO Q-10 PO) Take 1 capsule by mouth daily.   famotidine-calcium carbonate-magnesium hydroxide (PEPCID COMPLETE) 10-800-165 MG chewable tablet Chew 1-2 tablets by mouth 2 (two) times daily as needed (acid reflux).   FARXIGA 10 MG TABS tablet TAKE 1 TABLET(10 MG) BY MOUTH DAILY   furosemide (LASIX) 40 MG tablet Take 1 tablet (40 mg total) by mouth as needed (Take one tablet if weight gain of 3 pounds overnight or greater than 5 pounds in 1 week).   losartan (COZAAR) 25 MG tablet Take 0.5 tablets (12.5 mg total) by mouth daily.   Menaquinone-7 (VITAMIN K2 PO) Take 1 tablet by mouth daily.   metoprolol succinate (TOPROL XL) 25 MG 24 hr tablet Take 1/2 tablet (12.5 mg total) by mouth daily.   oxyCODONE (OXY IR/ROXICODONE) 5 MG immediate release tablet Take 1 tablet (5 mg total) by mouth every 6 (six) hours as needed for severe pain.   spironolactone (ALDACTONE) 25 MG tablet       Allergies: No Known Allergies  Social History   Socioeconomic History   Marital status: Married    Spouse name: Not on file   Number of children: Not on file   Years of education: Not on file   Highest education level: Not on file  Occupational History   Not on file  Tobacco Use   Smoking status: Never   Smokeless tobacco: Never  Vaping Use   Vaping Use: Never used  Substance and Sexual Activity   Alcohol use: No   Drug use: No   Sexual activity: Not on file  Other Topics Concern   Not on file  Social History Narrative   Not on file   Social Determinants of Health   Financial Resource Strain: Not on file  Food Insecurity: No Food Insecurity (03/10/2022)   Hunger Vital Sign    Worried About  Running Out of Food in the Last Year: Never true    Ran Out of Food in the Last Year: Never true  Transportation Needs: No Transportation Needs (03/10/2022)   PRAPARE - Administrator, Civil Service (Medical): No    Lack of Transportation (Non-Medical): No  Physical Activity: Not on file  Stress: Not on file  Social Connections: Not on file  Intimate Partner Violence: Not At Risk (03/10/2022)   Humiliation, Afraid, Rape, and Kick questionnaire    Fear of Current or Ex-Partner: No    Emotionally Abused: No  Physically Abused: No    Sexually Abused: No     Family History  Problem Relation Age of Onset   CAD Brother    Hypertension Other    Diabetes Neg Hx      ROS:  Please see the history of present illness.     All other systems reviewed and negative.    Physical Exam: Blood pressure 122/76, pulse 78, height 5\' 7"  (1.702 m), weight 187 lb 6.4 oz (85 kg), SpO2 97 %. General: Well developed, well nourished male in no acute distress. Head: Normocephalic, atraumatic, sclera non-icteric, no xanthomas, nares are without discharge. EENT: normal  Lymph Nodes:  none Neck: Negative for carotid bruits. JVD not elevated. Back:without scoliosis kyphosis Lungs: Clear bilaterally to auscultation without wheezes, rales, or rhonchi. Breathing is unlabored. Heart: RRR with S1 S2. No   murmur . No rubs, or gallops appreciated. Abdomen: Soft, non-tender, non-distended with normoactive bowel sounds. No hepatomegaly. No rebound/guarding. No obvious abdominal masses. Msk:  Strength and tone appear normal for age. Extremities: No clubbing or cyanosis. No  edema.  Distal pedal pulses are 2+ and equal bilaterally. Skin: Warm and Dry Neuro: Alert and oriented X 3. CN III-XII intact Grossly normal sensory and motor function . Psych:  Responds to questions appropriately with a normal affect.        EKG: sinus with narrow QRS   Assessment and Plan:  Ischemic cardiomyopathy  CAD s/p  CABG  Class Ib/IIa heart failure  Renal insufficiency  Estimated Creatinine Clearance: 49.5 mL/min (A) (by C-G formula based on SCr of 1.53 mg/dL (H)).   The patient has persistent left ventricular dysfunction despite guideline directed medical therapy limited by blood pressure.  Not withstanding his class Ib symptoms, given the severity of his LV dysfunction he is appropriately considered for ICD implantation for primary prevention in the setting of his ischemic heart disease  Have reviewed the potential benefits and risks of ICD implantation including but not limited to death, perforation of heart or lung, lead dislodgement, infection,  device malfunction and inappropriate shocks.  The patient and family expresses  understanding  and are willing to proceed.   Interview    Sherryl Manges

## 2022-06-25 NOTE — Patient Instructions (Signed)
Medication Instructions:  Your physician recommends that you continue on your current medications as directed. Please refer to the Current Medication list given to you today.  *If you need a refill on your cardiac medications before your next appointment, please call your pharmacy*   Lab Work: None ordered.  If you have labs (blood work) drawn today and your tests are completely normal, you will receive your results only by: MyChart Message (if you have MyChart) OR A paper copy in the mail If you have any lab test that is abnormal or we need to change your treatment, we will call you to review the results.   Testing/Procedures: Your physician has recommended that you have a defibrillator inserted. An implantable cardioverter defibrillator (ICD) is a small device that is placed in your chest or, in rare cases, your abdomen. This device uses electrical pulses or shocks to help control life-threatening, irregular heartbeats that could lead the heart to suddenly stop beating (sudden cardiac arrest). Leads are attached to the ICD that goes into your heart. This is done in the hospital and usually requires an overnight stay. Please see the instruction sheet given to you today for more information.    Follow-Up: At [facility], you and your health needs are our priority.  As part of our continuing mission to provide you with exceptional heart care, we have created designated Provider Care Teams.  These Care Teams include your primary Cardiologist (physician) and Advanced Practice Providers (APPs -  Physician Assistants and Nurse Practitioners) who all work together to provide you with the care you need, when you need it.  We recommend signing up for the patient portal called "MyChart".  Sign up information is provided on this After Visit Summary.  MyChart is used to connect with patients for Virtual Visits (Telemedicine).  Patients are able to view lab/test results, encounter notes,  upcoming appointments, etc.  Non-urgent messages can be sent to your provider as well.   To learn more about what you can do with MyChart, go to [email or web address].    Your next appointment:   To be scheduled 

## 2022-06-26 ENCOUNTER — Encounter (HOSPITAL_COMMUNITY)
Admission: RE | Admit: 2022-06-26 | Discharge: 2022-06-26 | Disposition: A | Discharge status: Home / Self Care | Payer: Medicare Other | Source: Ambulatory Visit | Attending: Internal Medicine | Admitting: Internal Medicine

## 2022-06-26 DIAGNOSIS — I213 ST elevation (STEMI) myocardial infarction of unspecified site: Secondary | ICD-10-CM

## 2022-06-26 DIAGNOSIS — Z951 Presence of aortocoronary bypass graft: Secondary | ICD-10-CM

## 2022-06-28 ENCOUNTER — Encounter (HOSPITAL_COMMUNITY)
Admission: RE | Admit: 2022-06-28 | Discharge: 2022-06-28 | Disposition: A | Discharge status: Home / Self Care | Payer: Medicare Other | Source: Ambulatory Visit | Attending: Internal Medicine | Admitting: Internal Medicine

## 2022-06-28 ENCOUNTER — Other Ambulatory Visit (HOSPITAL_COMMUNITY): Payer: Self-pay | Admitting: Pharmacist

## 2022-06-28 DIAGNOSIS — Z951 Presence of aortocoronary bypass graft: Secondary | ICD-10-CM

## 2022-06-28 DIAGNOSIS — I213 ST elevation (STEMI) myocardial infarction of unspecified site: Secondary | ICD-10-CM

## 2022-06-28 MED ORDER — LOSARTAN POTASSIUM 25 MG PO TABS: 12.5000 mg | ORAL_TABLET | Freq: Every day | ORAL | 3 refills | Status: DC

## 2022-06-28 NOTE — Progress Notes (Signed)
Patient's wife requested refill of losartan be sent to [facility] via VM. Refill sent.   Karle Plumber, PharmD, BCPS, BCCP, CPP Heart Failure Clinic Pharmacist [phone]

## 2022-06-28 NOTE — Progress Notes (Signed)
Reviewed home exercise guidelines with Juan Snow including endpoints, temperature precautions, target heart rate and rate of perceived exertion. He is currently walking on his treadmill 15-20 minutes 2 days/week and using his free weights (8& 10 lbs) as his mode of home exercise. Juan Snow plans to go to the gym for the first time since his heart attack. We discussed his concerns about returning to the gym. He plans to use the treadmill, bike, and/ or elliptical for his workout routine at the gym. We discussed how he's exercised in the cardiac rehab program several weeks and is doing well. He feel that he's making significant progress and he's not nearly as winded. He has a smart watch to monitor his pulse with exercise. Participant voices understanding of instructions given.  Artist Pais, MS, ACSM CEP

## 2022-07-01 ENCOUNTER — Other Ambulatory Visit (HOSPITAL_COMMUNITY): Payer: Self-pay

## 2022-07-01 ENCOUNTER — Encounter (HOSPITAL_COMMUNITY)
Admission: RE | Admit: 2022-07-01 | Discharge: 2022-07-01 | Disposition: A | Discharge status: Home / Self Care | Payer: Medicare Other | Source: Ambulatory Visit | Attending: Internal Medicine | Admitting: Internal Medicine

## 2022-07-01 DIAGNOSIS — Z951 Presence of aortocoronary bypass graft: Secondary | ICD-10-CM | POA: Diagnosis not present

## 2022-07-01 DIAGNOSIS — I213 ST elevation (STEMI) myocardial infarction of unspecified site: Secondary | ICD-10-CM

## 2022-07-03 ENCOUNTER — Encounter (HOSPITAL_COMMUNITY)
Admission: RE | Admit: 2022-07-03 | Discharge: 2022-07-03 | Disposition: A | Discharge status: Home / Self Care | Payer: Medicare Other | Source: Ambulatory Visit | Attending: Internal Medicine | Admitting: Internal Medicine

## 2022-07-03 DIAGNOSIS — I213 ST elevation (STEMI) myocardial infarction of unspecified site: Secondary | ICD-10-CM

## 2022-07-03 DIAGNOSIS — Z951 Presence of aortocoronary bypass graft: Secondary | ICD-10-CM | POA: Diagnosis present

## 2022-07-04 ENCOUNTER — Other Ambulatory Visit (HOSPITAL_COMMUNITY): Payer: Self-pay | Admitting: Cardiology

## 2022-07-05 ENCOUNTER — Encounter (HOSPITAL_COMMUNITY)
Admission: RE | Admit: 2022-07-05 | Discharge: 2022-07-05 | Disposition: A | Discharge status: Home / Self Care | Payer: Medicare Other | Source: Ambulatory Visit | Attending: Internal Medicine | Admitting: Internal Medicine

## 2022-07-05 DIAGNOSIS — I213 ST elevation (STEMI) myocardial infarction of unspecified site: Secondary | ICD-10-CM

## 2022-07-05 DIAGNOSIS — Z951 Presence of aortocoronary bypass graft: Secondary | ICD-10-CM | POA: Diagnosis not present

## 2022-07-07 ENCOUNTER — Encounter: Payer: Self-pay | Admitting: Physician Assistant

## 2022-07-07 DIAGNOSIS — I2729 Other secondary pulmonary hypertension: Secondary | ICD-10-CM

## 2022-07-07 HISTORY — DX: Other secondary pulmonary hypertension: I27.29

## 2022-07-07 NOTE — Progress Notes (Unsigned)
Cardiology Office Note:    Date:  07/08/2022  ID:  Debroah Loop, DOB [birth year], MRN [id number] PCP: Jackelyn Poling, DO  [facility] Providers Cardiologist:  Orbie Pyo, MD Advanced Heart Failure:  Marca Ancona, MD          Patient Profile:   Coronary artery disease Anterolateral STEMI 03/10/2022 Tx w CABG x 2 (LIMA-LAD, SVG-RI) High grade oLAD and RI stenosis >> managed w IABP C/b Supraventricular Tachycardia/atrial ectopy; AKI (SCr ? 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 TTE 03/11/2022: EF 25-30, GR 2 DD, normal RVSF, mildly elev PASP, trivial MR, RAP 3 Limited TTE 03/25/22: EF 25-30, Gr 2 DD, trivial MR, dist ant-sept/inf-sept/apical AK; inf/apico-lat/mid ant-lat/apico-ant/basal inf-sept HK TTE 06/10/2022: EF <20, no RWMA, normal RVSF, moderately elevated PASP, mild MR, moderate TR, RAP 3, RVSP 52.6 Nonsustained ventricular tachycardia Pulmonary embolism S/p submassive PE in 05/2021-Tx catheter directed thrombolysis; Xarelto Rx Hyperlipidemia Pre-diabetes  Chronic kidney disease       History of Present Illness:   Juan Snow is a 67 y.o. male who returns for follow-up of CAD, CHF.  He was last seen 04/12/2022 for posthospitalization follow-up after his myocardial infarction and CABG.  He had a complicated postoperative course.  He has also been followed by the advanced heart failure clinic.  His follow-up echocardiogram/8/24 demonstrated continued severe reduction in LV function with EF <20.  He has been referred to EP.  He is scheduled for ICD implantation with Dr. Graciela Husbands 07/31/2022.  He is here with his wife.  He has been doing very well.  He has not had chest discomfort, shortness of breath, syncope, orthopnea, leg edema.    Review of Systems  Gastrointestinal:  Negative for hematochezia and melena.  Genitourinary:  Negative for hematuria.   see HPI    Studies Reviewed:    EKG:  not done  Risk  Assessment/Calculations:             Physical Exam:   VS:  BP 104/77   Pulse 78   Ht 5\' 7"  (1.702 m)   Wt 186 lb 12.8 oz (84.7 kg)   SpO2 97%   BMI 29.26 kg/m    Wt Readings from Last 3 Encounters:  07/08/22 186 lb 12.8 oz (84.7 kg)  06/25/22 187 lb 6.4 oz (85 kg)  05/28/22 184 lb 8.4 oz (83.7 kg)    Constitutional:      Appearance: Healthy appearance. Not in distress.  Neck:     Vascular: JVD normal.  Pulmonary:     Breath sounds: Normal breath sounds. No wheezing. No rales.  Cardiovascular:     Normal rate. Regular rhythm.     Murmurs: There is no murmur.  Edema:    Peripheral edema absent.  Abdominal:     Palpations: Abdomen is soft.       ASSESSMENT AND PLAN:   CAD in native artery History of anterolateral STEMI in January 2024 s/p CABG.  He is doing well without chest discomfort or significant shortness of breath to suggest angina.  He has been attending cardiac rehabilitation.  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  Continue ASA 81 mg daily, Toprol-XL 12.5 mg daily.  He is having joint pains related to Lipitor.  This will be changed to Crestor.  Follow-up in 6 months.  HFrEF (heart failure with reduced ejection fraction) (HCC) EF <20.  He has been evaluated by Dr.  Graciela Husbands with the EP.  ICD implantation is scheduled for later this month.  NYHA II.  Volume status stable.  Continue Farxiga 10 mg daily, losartan 12.5 mg daily, Toprol-XL 12.5 mg daily, spironolactone 25 mg daily.  Continue follow-up with Dr. Shirlee Latch in the advanced heart failure clinic as planned.  Hyperlipidemia LDL goal <70 LDL in April 2024 was optimal at 52.  He notes arthralgias related to Lipitor.  Stop Lipitor.  Start Crestor 40 mg daily.  He has fasting labs arranged with primary care in October.      Dispo:  Return in about 6 months (around 01/08/2023) for Routine Follow Up, w/ Dr. Lynnette Caffey.  Signed, Tereso Newcomer, PA-C

## 2022-07-08 ENCOUNTER — Ambulatory Visit: Payer: Medicare Other | Attending: Physician Assistant | Admitting: Physician Assistant

## 2022-07-08 ENCOUNTER — Encounter (HOSPITAL_COMMUNITY)
Admission: RE | Admit: 2022-07-08 | Discharge: 2022-07-08 | Disposition: A | Discharge status: Home / Self Care | Payer: Medicare Other | Source: Ambulatory Visit | Attending: Internal Medicine | Admitting: Internal Medicine

## 2022-07-08 ENCOUNTER — Encounter: Payer: Self-pay | Admitting: Physician Assistant

## 2022-07-08 VITALS — BP 104/77 | HR 78 | Ht 67.0 in | Wt 186.8 lb

## 2022-07-08 DIAGNOSIS — I5020 Unspecified systolic (congestive) heart failure: Secondary | ICD-10-CM | POA: Insufficient documentation

## 2022-07-08 DIAGNOSIS — E785 Hyperlipidemia, unspecified: Secondary | ICD-10-CM | POA: Diagnosis not present

## 2022-07-08 DIAGNOSIS — I2729 Other secondary pulmonary hypertension: Secondary | ICD-10-CM

## 2022-07-08 DIAGNOSIS — Z951 Presence of aortocoronary bypass graft: Secondary | ICD-10-CM

## 2022-07-08 DIAGNOSIS — I2510 Atherosclerotic heart disease of native coronary artery without angina pectoris: Secondary | ICD-10-CM | POA: Insufficient documentation

## 2022-07-08 DIAGNOSIS — I213 ST elevation (STEMI) myocardial infarction of unspecified site: Secondary | ICD-10-CM

## 2022-07-08 MED ORDER — ROSUVASTATIN CALCIUM 40 MG PO TABS: 40.0000 mg | ORAL_TABLET | Freq: Every day | ORAL | 3 refills | Status: DC

## 2022-07-08 NOTE — Patient Instructions (Signed)
Medication Instructions:  Your physician has recommended you make the following change in your medication:   WHEN you finish your current bottle of Atorvastatin, you will start Rosuvastatin 40 mg taking 1 daily   *If you need a refill on your cardiac medications before your next appointment, please call your pharmacy*   Lab Work: None ordered  If you have labs (blood work) drawn today and your tests are completely normal, you will receive your results only by: MyChart Message (if you have MyChart) OR A paper copy in the mail If you have any lab test that is abnormal or we need to change your treatment, we will call you to review the results.   Testing/Procedures: None ordered   Follow-Up: At [facility], you and your health needs are our priority.  As part of our continuing mission to provide you with exceptional heart care, we have created designated Provider Care Teams.  These Care Teams include your primary Cardiologist (physician) and Advanced Practice Providers (APPs -  Physician Assistants and Nurse Practitioners) who all work together to provide you with the care you need, when you need it.  We recommend signing up for the patient portal called "MyChart".  Sign up information is provided on this After Visit Summary.  MyChart is used to connect with patients for Virtual Visits (Telemedicine).  Patients are able to view lab/test results, encounter notes, upcoming appointments, etc.  Non-urgent messages can be sent to your provider as well.   To learn more about what you can do with MyChart, go to [email or web address].    Your next appointment:   6 month(s)  Provider:   Orbie Pyo, MD     Other Instructions

## 2022-07-08 NOTE — Assessment & Plan Note (Signed)
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  He is doing well without chest discomfort or significant shortness of breath to suggest angina.  He has been attending cardiac rehabilitation.  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  Continue ASA 81 mg daily, Toprol-XL 12.5 mg daily.  He is having joint pains related to Lipitor.  This will be changed to Crestor.  Follow-up in 6 months.

## 2022-07-08 NOTE — Assessment & Plan Note (Signed)
EF <20.  He has been evaluated by Dr. Graciela Husbands with the EP.  ICD implantation is scheduled for later this month.  NYHA II.  Volume status stable.  Continue Farxiga 10 mg daily, losartan 12.5 mg daily, Toprol-XL 12.5 mg daily, spironolactone 25 mg daily.  Continue follow-up with Dr. Shirlee Latch in the advanced heart failure clinic as planned.

## 2022-07-08 NOTE — Assessment & Plan Note (Signed)
LDL in April 2024 was optimal at 52.  He notes arthralgias related to Lipitor.  Stop Lipitor.  Start Crestor 40 mg daily.  He has fasting labs arranged with primary care in October.

## 2022-07-08 NOTE — Progress Notes (Signed)
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 Swallow whole., Disp: 30 tablet, Rfl: 12   Cholecalciferol (VITAMIN D-3 PO), Take 1 tablet by mouth daily., Disp: , Rfl:    Coenzyme Q10 (CO Q-10 PO), Take 1 capsule by mouth daily., Disp: , Rfl:    famotidine-calcium carbonate-magnesium hydroxide (PEPCID COMPLETE) 10-800-165 MG chewable tablet, Chew 1-2 tablets by mouth 2 (two) times daily as needed (acid reflux)., Disp: , Rfl:    FARXIGA 10 MG TABS tablet, TAKE 1 TABLET(10 MG) BY MOUTH DAILY, Disp: 30 tablet, Rfl: 11   furosemide (LASIX) 40 MG tablet, Take 1 tablet (40 mg total) by mouth as needed (Take one tablet if weight gain of 3 pounds overnight or greater than 5 pounds in 1 week)., Disp: 30 tablet, Rfl: 11   losartan (COZAAR) 25 MG tablet, Take 0.5 tablets (12.5 mg total) by mouth daily., Disp: 45 tablet, Rfl: 3   Menaquinone-7 (VITAMIN K2 PO), Take 1 tablet by mouth daily., Disp: , Rfl:    metoprolol succinate (TOPROL XL) 25 MG 24 hr tablet,  Take 1/2 tablet (12.5 mg total) by mouth daily., Disp: 45 tablet, Rfl: 3   oxyCODONE (OXY IR/ROXICODONE) 5 MG immediate release tablet, Take 1 tablet (5 mg total) by mouth every 6 (six) hours as needed for severe pain., Disp: 28 tablet, Rfl: 0   rosuvastatin (CRESTOR) 40 MG tablet, Take 1 tablet (40 mg total) by mouth daily., Disp: 90 tablet, Rfl: 3   sildenafil (REVATIO) 20 MG tablet, Take 20 mg by mouth as needed (erectile dysfunction)., Disp: , Rfl:    spironolactone (ALDACTONE) 25 MG tablet, TAKE 1 TABLET(25 MG) BY MOUTH DAILY, Disp: 90 tablet, Rfl: 0  Past Medical History: Past Medical History:  Diagnosis Date   Arthritis    OA   Cough    WHITE SPUTUM OCC, NO FEVER COLD SYMPTOMS MAINLY   GERD (gastroesophageal reflux disease)    Other secondary pulmonary hypertension (HCC) 07/07/2022   TTE 06/10/2022: EF <20, no RWMA, normal RVSF, moderately elevated PASP, mild MR, moderate TR, RAP 3, RVSP 52.6    Tobacco Use: Social History   Tobacco Use  Smoking Status Never  Smokeless Tobacco Never    Labs: Review Flowsheet  More data exists      Latest Ref Rng & Units 03/25/2022 03/26/2022 03/27/2022 03/28/2022 06/10/2022  Labs for ITP Cardiac and Pulmonary Rehab  Cholestrol 100 - 199 mg/dL - - - - 086  LDL (calc) 0 - 99 mg/dL - - - - 52   HDL-C >16 mg/dL - - - - 50   Trlycerides 0 - 149 mg/dL - - - - 77   O2 Saturation % 56.7  58.9  55.2  63.5  61.2  43.4  54.5  -    Capillary Blood Glucose: Lab Results  Component Value Date   GLUCAP 86 03/28/2022   GLUCAP 151 (H) 03/27/2022   GLUCAP 121 (H) 03/27/2022   GLUCAP 123 (H) 03/27/2022   GLUCAP 92 03/27/2022     Exercise Target Goals: Exercise Program Goal: Individual exercise prescription set using results from initial 6 min walk test and THRR while considering  patient's activity barriers and safety.   Exercise Prescription Goal: Initial exercise prescription builds to 30-45 minutes a day of aerobic activity, 2-3 days per week.   Home exercise guidelines will be given to patient during program as part of exercise prescription that the participant will acknowledge.  Activity Barriers & Risk Stratification:  Activity Barriers & Cardiac Risk Stratification - 05/28/22 0826       Activity Barriers & Cardiac Risk Stratification   Activity Barriers Left Hip Replacement    Cardiac Risk Stratification High             6 Minute Walk:  6 Minute Walk     Row Name 05/28/22 0838         6 Minute Walk   Phase Initial     Distance 1416 feet     Walk Time 6 minutes     # of Rest Breaks 0     MPH 2.68     METS 3.23     RPE 11     Perceived Dyspnea  1     VO2 Peak 11.32     Symptoms Yes (comment)     Comments Mild shortness of breath.     Resting HR 70 bpm     Resting BP 98/72     Resting Oxygen Saturation  98 %     Exercise Oxygen Saturation  during 6 min walk 97 %     Max Ex. HR 103 bpm     Max Ex. BP 120/88     2 Minute Post BP 122/84              Oxygen Initial Assessment:   Oxygen Re-Evaluation:   Oxygen Discharge (Final Oxygen Re-Evaluation):   Initial Exercise Prescription:  Initial Exercise Prescription - 05/28/22 0900       Date of Initial Exercise RX and Referring Provider   Date 05/28/22    Referring Provider Orbie Pyo, MD    Expected Discharge Date 08/09/22      Treadmill   MPH 2    Grade 3    Minutes 15    METs 3.36      NuStep   Level 4    SPM 85    Minutes 15    METs 3.2      Prescription Details   Frequency (times per week) 3    Duration Progress to 30 minutes of continuous aerobic without signs/symptoms of physical distress      Intensity   THRR 40-80% of Max Heartrate 62-123    Ratings of Perceived Exertion 11-13    Perceived Dyspnea 0-4      Progression   Progression Continue to progress workloads to maintain intensity without signs/symptoms of physical distress.      Resistance Training  Training Prescription Yes    Weight 8 lbs    Reps  10-15             Perform Capillary Blood Glucose checks as needed.  Exercise Prescription Changes:   Exercise Prescription Changes     Row Name 06/03/22 1024 06/17/22 1026 06/28/22 1027 07/01/22 1026       Response to Exercise   Blood Pressure (Admit) 108/66 100/74 112/62 102/68    Blood Pressure (Exercise) 136/80 122/80 112/80 138/86    Blood Pressure (Exit) 98/70 92/64 100/70 98/70    Heart Rate (Admit) 71 bpm 74 bpm 79 bpm 77 bpm    Heart Rate (Exercise) 111 bpm 111 bpm 107 bpm 105 bpm    Heart Rate (Exit) 80 bpm 77 bpm 81 bpm 84 bpm    Rating of Perceived Exertion (Exercise) 15 11 11 11     Symptoms None None None None    Comments Decreased WL on the NuStep due to RPE of 15. -- Reviewed home exercise guidelines and goals. --    Duration Continue with 30 min of aerobic exercise without signs/symptoms of physical distress. Continue with 30 min of aerobic exercise without signs/symptoms of physical distress. Continue with 30 min of aerobic exercise without signs/symptoms of physical distress. Continue with 30 min of aerobic exercise without signs/symptoms of physical distress.    Intensity THRR unchanged THRR unchanged THRR unchanged THRR unchanged      Progression   Progression Continue to progress workloads to maintain intensity without signs/symptoms of physical distress. Continue to progress workloads to maintain intensity without signs/symptoms of physical distress. Continue to progress workloads to maintain intensity without signs/symptoms of physical distress. Continue to progress workloads to maintain intensity without signs/symptoms of physical distress.    Average METs 2.8 2.9 2.9 3      Resistance Training   Training Prescription Yes Yes Yes Yes    Weight 6 lbs 6 lbs 6 lbs 6 lbs    Reps 10-15 10-15 10-15 10-15    Time 10 Minutes 10 Minutes 10 Minutes 10 Minutes      Interval Training   Interval Training No No No No      Treadmill   MPH 2 2 2 2     Grade 3 3 3 3      Minutes 15 15 15 15     METs 3.36 3.36 3.36 3.36      NuStep   Level 3  Decreasd workload from 4 to 3 due to RPE of 15 3 3 3     SPM 72 78 81 82    Minutes 15 15 15 15     METs 2.2 2.4 2.4 2.6      Home Exercise Plan   Plans to continue exercise at -- -- Home (comment)  Walking, treadmill at home, plans to return to gym. Home (comment)  Walking, treadmill at home, plans to return to gym.    Frequency -- -- Add 2 additional days to program exercise sessions. Add 2 additional days to program exercise sessions.    Initial Home Exercises Provided -- -- 06/28/22 06/28/22             Exercise Comments:   Exercise Comments     Row Name 06/03/22 1130 06/05/22 1108 06/17/22 1103 06/28/22 1040 07/01/22 1059   Exercise Comments Patient tolerated first session of exercise well without symptoms. Decreased workload on NuStep from 4 to 3 due to difficulty. Reviewed METs with patient. Reviewed goals with patient. Reviewed  home exercise guidelines and goals with patient. Reviewed  METs and goals with patient.            Exercise Goals and Review:   Exercise Goals     Row Name 05/28/22 0807             Exercise Goals   Increase Physical Activity Yes       Intervention Provide advice, education, support and counseling about physical activity/exercise needs.;Develop an individualized exercise prescription for aerobic and resistive training based on initial evaluation findings, risk stratification, comorbidities and participant's personal goals.       Expected Outcomes Short Term: Attend rehab on a regular basis to increase amount of physical activity.;Long Term: Add in home exercise to make exercise part of routine and to increase amount of physical activity.;Long Term: Exercising regularly at least 3-5 days a week.       Increase Strength and Stamina Yes       Intervention Provide advice, education, support and counseling about physical activity/exercise needs.;Develop an  individualized exercise prescription for aerobic and resistive training based on initial evaluation findings, risk stratification, comorbidities and participant's personal goals.       Expected Outcomes Short Term: Increase workloads from initial exercise prescription for resistance, speed, and METs.;Short Term: Perform resistance training exercises routinely during rehab and add in resistance training at home;Long Term: Improve cardiorespiratory fitness, muscular endurance and strength as measured by increased METs and functional capacity ( )       Able to understand and use rate of perceived exertion (RPE) scale Yes       Intervention Provide education and explanation on how to use RPE scale       Expected Outcomes Short Term: Able to use RPE daily in rehab to express subjective intensity level;Long Term:  Able to use RPE to guide intensity level when exercising independently       Knowledge and understanding of Target Heart Rate Range (THRR) Yes       Intervention Provide education and explanation of THRR including how the numbers were predicted and where they are located for reference       Expected Outcomes Short Term: Able to state/look up THRR;Long Term: Able to use THRR to govern intensity when exercising independently;Short Term: Able to use daily as guideline for intensity in rehab       Able to check pulse independently Yes       Intervention Provide education and demonstration on how to check pulse in carotid and radial arteries.;Review the importance of being able to check your own pulse for safety during independent exercise       Expected Outcomes Short Term: Able to explain why pulse checking is important during independent exercise;Long Term: Able to check pulse independently and accurately       Understanding of Exercise Prescription Yes       Intervention Provide education, explanation, and written materials on patient's individual exercise prescription       Expected Outcomes  Short Term: Able to explain program exercise prescription;Long Term: Able to explain home exercise prescription to exercise independently                Exercise Goals Re-Evaluation :  Exercise Goals Re-Evaluation     Row Name 06/03/22 1130 06/17/22 1103 06/28/22 1040 07/01/22 1059       Exercise Goal Re-Evaluation   Exercise Goals Review Increase Physical Activity;Able to understand and use rate of perceived exertion (RPE) scale;Increase Strength and Stamina  Increase Physical Activity;Able to understand and use rate of perceived exertion (RPE) scale;Increase Strength and Stamina;Knowledge and understanding of Target Heart Rate Range (THRR);Able to check pulse independently Increase Physical Activity;Able to understand and use rate of perceived exertion (RPE) scale;Increase Strength and Stamina;Knowledge and understanding of Target Heart Rate Range (THRR);Able to check pulse independently;Understanding of Exercise Prescription Increase Physical Activity;Able to understand and use rate of perceived exertion (RPE) scale;Increase Strength and Stamina;Knowledge and understanding of Target Heart Rate Range (THRR);Able to check pulse independently;Understanding of Exercise Prescription    Comments Patient able to understand and use RPE scale approriately. Patient is making good progress with exercise. Patient is walking outside or on his treadmill at home 15 minutes daily and using his 8 lb shand weights. Patient's goal is to get back to exercise at the gym at the levels he was previously exercising at. Reviewed exercise prescription with patient. Patient is walking 15-20 minutes on his TM at home. We discussed increasing duration to 30 minutes, and he's amenable to this. Patient plans to return to the gym this weekend. Patient mentioned he's been having trouble sleeping and will follow-up with his cardiologist regarding this issue. Patient rode the bike 15 minutes, and used the weight machines at the gym  this weekend and tolerated well without anxiety.    Expected Outcomes Progress workloads as tolerated to help increase strength and stamina. Continue to progress workloads as tolerated to achieve previous exercise workloads. Patient will increase walking duration at home to 30 minutes and will resume exercise at local gym. Patient will continue exercise at the gym in addition to exercise at cardiac rehab.             Discharge Exercise Prescription (Final Exercise Prescription Changes):  Exercise Prescription Changes - 07/01/22 1026       Response to Exercise   Blood Pressure (Admit) 102/68    Blood Pressure (Exercise) 138/86    Blood Pressure (Exit) 98/70    Heart Rate (Admit) 77 bpm    Heart Rate (Exercise) 105 bpm    Heart Rate (Exit) 84 bpm    Rating of Perceived Exertion (Exercise) 11    Symptoms None    Duration Continue with 30 min of aerobic exercise without signs/symptoms of physical distress.    Intensity THRR unchanged      Progression   Progression Continue to progress workloads to maintain intensity without signs/symptoms of physical distress.    Average METs 3      Resistance Training   Training Prescription Yes    Weight 6 lbs    Reps 10-15    Time 10 Minutes      Interval Training   Interval Training No      Treadmill   MPH 2    Grade 3    Minutes 15    METs 3.36      NuStep   Level 3    SPM 82    Minutes 15    METs 2.6      Home Exercise Plan   Plans to continue exercise at Home (comment)   Walking, treadmill at home, plans to return to gym.   Frequency Add 2 additional days to program exercise sessions.    Initial Home Exercises Provided 06/28/22             Nutrition:  Target Goals: Understanding of nutrition guidelines, daily intake of sodium 1500mg , cholesterol 200mg , calories 30% from fat and 7% or less from saturated fats, daily to  have 5 or more servings of fruits and vegetables.  Biometrics:  Pre Biometrics - 05/28/22 0740        Pre Biometrics   Waist Circumference 39.5 inches    Hip Circumference 39.75 inches    Waist to Hip Ratio 0.99 %    Triceps Skinfold 7 mm    % Body Fat 24.4 %    Grip Strength 40 kg    Flexibility 12.75 in    Single Leg Stand 14.79 seconds              Nutrition Therapy Plan and Nutrition Goals:  Nutrition Therapy & Goals - 07/03/22 1105       Nutrition Therapy   Diet Heart Healthy Diet    Drug/Food Interactions Statins/Certain Fruits      Personal Nutrition Goals   Nutrition Goal Patient to identify strategies for reducing cardiovascular risk by attending the [employer] education and nutrition series weekly.    Personal Goal #2 Patient to improve diet quality by using the plate method as a guide for meal planning to include lean protein/plant protein, fruits, vegetables, whole grains, nonfat dairy as part of a well-balanced diet.    Personal Goal #3 Patient to identify food sources and limit daily intake of saturated fat, trans fat, sodium, and refined carbohydrates.    Personal Goal #4 Patient to reduce sodium to 1500mg  per day.    Comments Goals in action. Mr. Zani continues to attend the [employer] education and nutrition series regularly. He reports improved understanding of reading food labels for sodium and increased fiber intake. He reports he has reduced sodium to 2000mg  per day and is now working closer to 1500mg  per day. His wife remains very supportive of lifestyle changes. Mr. Mayle will benefit from participation in intensive cardiac rehab for nutrition, exercise, and lifestyle modification.      Intervention Plan   Intervention Nutrition handout(s) given to patient.;Prescribe, educate and counsel regarding individualized specific dietary modifications aiming towards targeted core components such as weight, hypertension, lipid management, diabetes, heart failure and other comorbidities.    Expected Outcomes Short Term Goal: Understand basic principles of  dietary content, such as calories, fat, sodium, cholesterol and nutrients.;Long Term Goal: Adherence to prescribed nutrition plan.             Nutrition Assessments:  Nutrition Assessments - 06/03/22 1029       Rate Your Plate Scores   Pre Score 64            MEDIFICTS Score Key: ?70 Need to make dietary changes  40-70 Heart Healthy Diet ? 40 Therapeutic Level Cholesterol Diet   Flowsheet Row INTENSIVE CARDIAC REHAB from 06/03/2022 in [facility] for Heart, Vascular, & Lung Health  Picture Your Plate Total Score on Admission 64      Picture Your Plate Scores: <16 Unhealthy dietary pattern with much room for improvement. 41-50 Dietary pattern unlikely to meet recommendations for good health and room for improvement. 51-60 More healthful dietary pattern, with some room for improvement.  >60 Healthy dietary pattern, although there may be some specific behaviors that could be improved.    Nutrition Goals Re-Evaluation:  Nutrition Goals Re-Evaluation     Row Name 06/03/22 1031 07/03/22 1105           Goals   Current Weight 184 lb 8.4 oz (83.7 kg) 188 lb 4.4 oz (85.4 kg)      Comment Cr 1.45, GFR 53, Lipoprotein A 97.4, cholesterol 210,  triglycerides 239, LDL 121, A1c 6.2 Cr 1.53, GFR 50, Lipids improved WNL, Lipoprotein A 97.4      Expected Outcome Mr. Dyess will benefit from participation in intensive cardiac rehab for nutrition, exercise, and lifestyle modification. Goals in action. Mr. Deboy continues to attend the [employer] education and nutrition series regularly. He reports improved understanding of reading food labels for sodium and increased fiber intake. He reports he has reduced sodium to 2000mg  per day and is now working closer to 1500mg  per day. His wife remains very supportive of lifestyle changes. Mr. Ravenscroft will benefit from participation in intensive cardiac rehab for nutrition, exercise, and lifestyle modification.                Nutrition Goals Re-Evaluation:  Nutrition Goals Re-Evaluation     Row Name 06/03/22 1031 07/03/22 1105           Goals   Current Weight 184 lb 8.4 oz (83.7 kg) 188 lb 4.4 oz (85.4 kg)      Comment Cr 1.45, GFR 53, Lipoprotein A 97.4, cholesterol 210, triglycerides 239, LDL 121, A1c 6.2 Cr 1.53, GFR 50, Lipids improved WNL, Lipoprotein A 97.4      Expected Outcome Mr. Hamrock will benefit from participation in intensive cardiac rehab for nutrition, exercise, and lifestyle modification. Goals in action. Mr. Louvier continues to attend the [employer] education and nutrition series regularly. He reports improved understanding of reading food labels for sodium and increased fiber intake. He reports he has reduced sodium to 2000mg  per day and is now working closer to 1500mg  per day. His wife remains very supportive of lifestyle changes. Mr. Eliassen will benefit from participation in intensive cardiac rehab for nutrition, exercise, and lifestyle modification.               Nutrition Goals Discharge (Final Nutrition Goals Re-Evaluation):  Nutrition Goals Re-Evaluation - 07/03/22 1105       Goals   Current Weight 188 lb 4.4 oz (85.4 kg)    Comment Cr 1.53, GFR 50, Lipids improved WNL, Lipoprotein A 97.4    Expected Outcome Goals in action. Mr. Minniefield continues to attend the [employer] education and nutrition series regularly. He reports improved understanding of reading food labels for sodium and increased fiber intake. He reports he has reduced sodium to 2000mg  per day and is now working closer to 1500mg  per day. His wife remains very supportive of lifestyle changes. Mr. Mole will benefit from participation in intensive cardiac rehab for nutrition, exercise, and lifestyle modification.             Psychosocial: Target Goals: Acknowledge presence or absence of significant depression and/or stress, maximize coping skills, provide positive support system. Participant is able to verbalize  types and ability to use techniques and skills needed for reducing stress and depression.  Initial Review & Psychosocial Screening:  Initial Psych Review & Screening - 05/28/22 0956       Initial Review   Current issues with None Identified      Family Dynamics   Good Support System? Yes    Comments Patient has support from his wife and children.      Barriers   Psychosocial barriers to participate in program There are no identifiable barriers or psychosocial needs.             Quality of Life Scores:  Quality of Life - 05/28/22 0951       Quality of Life   Select Quality of Life  Quality of Life Scores   Health/Function Pre 24.8 %    Socioeconomic Pre 25.8 %    Psych/Spiritual Pre 24.86 %    Family Pre 28.8 %    GLOBAL Pre 25.59 %            Scores of 19 and below usually indicate a poorer quality of life in these areas.  A difference of  2-3 points is a clinically meaningful difference.  A difference of 2-3 points in the total score of the Quality of Life Index has been associated with significant improvement in overall quality of life, self-image, physical symptoms, and general health in studies assessing change in quality of life.  PHQ-9: Review Flowsheet       05/28/2022  Depression screen PHQ 2/9  Decreased Interest 0  Down, Depressed, Hopeless 0  PHQ - 2 Score 0  Altered sleeping 0  Tired, decreased energy 0  Change in appetite 0  Feeling bad or failure about yourself  0  Trouble concentrating 0  Moving slowly or fidgety/restless 0  Suicidal thoughts 0  PHQ-9 Score 0   Interpretation of Total Score  Total Score Depression Severity:  1-4 = Minimal depression, 5-9 = Mild depression, 10-14 = Moderate depression, 15-19 = Moderately severe depression, 20-27 = Severe depression   Psychosocial Evaluation and Intervention:   Psychosocial Re-Evaluation:  Psychosocial Re-Evaluation     Row Name 06/03/22 1659 06/13/22 1241 07/09/22 1400          Psychosocial Re-Evaluation   Current issues with None Identified None Identified None Identified     Comments -- -- For ICD placement on 06/25/22. Erin has not voiced any increased concerns or stressors regardign his upcoming procedure     Interventions Encouraged to attend Cardiac Rehabilitation for the exercise Encouraged to attend Cardiac Rehabilitation for the exercise Encouraged to attend Cardiac Rehabilitation for the exercise     Continue Psychosocial Services  No Follow up required No Follow up required No Follow up required              Psychosocial Discharge (Final Psychosocial Re-Evaluation):  Psychosocial Re-Evaluation - 07/09/22 1400       Psychosocial Re-Evaluation   Current issues with None Identified    Comments For ICD placement on 06/25/22. Kelin has not voiced any increased concerns or stressors regardign his upcoming procedure    Interventions Encouraged to attend Cardiac Rehabilitation for the exercise    Continue Psychosocial Services  No Follow up required             Vocational Rehabilitation: Provide vocational rehab assistance to qualifying candidates.   Vocational Rehab Evaluation & Intervention:  Vocational Rehab - 05/28/22 1610       Initial Vocational Rehab Evaluation & Intervention   Assessment shows need for Vocational Rehabilitation No   Patient is retired. No vocational rehab needs.     Vocational Rehab Re-Evaulation   Comments --             Education: Education Goals: Education classes will be provided on a weekly basis, covering required topics. Participant will state understanding/return demonstration of topics presented.    Education     Row Name 06/03/22 1300     Education   Cardiac Education Topics Pritikin   Geographical information systems officer Exercise   Exercise Workshop Exercise Basics: Building Your Action Plan   Instruction Review Code 1- Verbalizes Understanding    Class  Start Time 1150   Class Stop Time 1235   Class Time Calculation (min) 45 min    Row Name 06/12/22 1300     Education   Cardiac Education Topics Pritikin   Customer service manager   Weekly Topic Efficiency Cooking - Meals in a Snap   Instruction Review Code 1- Verbalizes Understanding   Class Start Time 1140   Class Stop Time 1220   Class Time Calculation (min) 40 min    Row Name 06/14/22 1400     Education   Cardiac Education Topics Pritikin   Psychologist, forensic Exercise Education   Exercise Education Move It!   Instruction Review Code 1- Verbalizes Understanding   Class Start Time 1150   Class Stop Time 1230   Class Time Calculation (min) 40 min    Row Name 06/17/22 1600     Education   Cardiac Education Topics Pritikin   Geographical information systems officer Psychosocial   Psychosocial Workshop Focused Goals, Sustainable Changes   Instruction Review Code 1- Verbalizes Understanding   Class Start Time 1150   Class Stop Time 1225   Class Time Calculation (min) 35 min    Row Name 06/19/22 1300     Education   Cardiac Education Topics Pritikin   Orthoptist   Educator Dietitian   Weekly Topic One-Pot Wonders   Instruction Review Code 1- Verbalizes Understanding   Class Start Time 1140   Class Stop Time 1212   Class Time Calculation (min) 32 min    Row Name 06/21/22 1400     Education   Cardiac Education Topics Pritikin   Hospital doctor Education   General Education Hypertension and Heart Disease   Instruction Review Code 1- Verbalizes Understanding   Class Start Time 1147   Class Stop Time 1220   Class Time Calculation (min) 33 min    Row Name 06/24/22 1300     Education   Cardiac Education Topics Pritikin    Select Core Videos     Core Videos   Educator Nurse   Select Exercise Education   Exercise Education Biomechanial Limitations   Instruction Review Code 1- Verbalizes Understanding   Class Start Time 1156   Class Stop Time 1232   Class Time Calculation (min) 36 min    Row Name 06/26/22 1300     Education   Cardiac Education Topics Pritikin   Customer service manager   Weekly Topic Comforting Weekend Breakfasts   Instruction Review Code 1- Verbalizes Understanding   Class Start Time 1135   Class Stop Time 1223   Class Time Calculation (min) 48 min    Row Name 06/28/22 1300     Education   Cardiac Education Topics Pritikin   Nurse, children's   Educator Dietitian   Select Nutrition   Nutrition Dining Out - Part 1   Instruction Review Code 1- Verbalizes Understanding   Class Start Time 1145   Class Stop Time 1222   Class Time Calculation (min) 37 min    Row Name 07/01/22 1400  Education   Cardiac Education Topics Pritikin   Licensed conveyancer Nutrition   Nutrition Facts on Fat   Instruction Review Code 1- Verbalizes Understanding   Class Start Time 1145   Class Stop Time 1225   Class Time Calculation (min) 40 min    Row Name 07/03/22 1200     Education   Cardiac Education Topics Pritikin   Customer service manager   Weekly Topic Fast Evening Meals   Instruction Review Code 1- Verbalizes Understanding   Class Start Time 1140   Class Stop Time 1215   Class Time Calculation (min) 35 min    Row Name 07/05/22 1000     Education   Cardiac Education Topics Pritikin   Nurse, children's   Educator Dietitian   Select Nutrition   Nutrition Vitamins and Minerals   Instruction Review Code 1- Verbalizes Understanding            Core Videos: Exercise    Move It!  Clinical staff conducted  group or individual video education with verbal and written material and guidebook.  Patient learns the recommended Pritikin exercise program. Exercise with the goal of living a long, healthy life. Some of the health benefits of exercise include controlled diabetes, healthier blood pressure levels, improved cholesterol levels, improved heart and lung capacity, improved sleep, and better body composition. Everyone should speak with their doctor before starting or changing an exercise routine.  Biomechanical Limitations Clinical staff conducted group or individual video education with verbal and written material and guidebook.  Patient learns how biomechanical limitations can impact exercise and how we can mitigate and possibly overcome limitations to have an impactful and balanced exercise routine.  Body Composition Clinical staff conducted group or individual video education with verbal and written material and guidebook.  Patient learns that body composition (ratio of muscle mass to fat mass) is a key component to assessing overall fitness, rather than body weight alone. Increased fat mass, especially visceral belly fat, can put [place] at increased risk for metabolic syndrome, type 2 diabetes, heart disease, and even death. It is recommended to combine diet and exercise (cardiovascular and resistance training) to improve your body composition. Seek guidance from your physician and exercise physiologist before implementing an exercise routine.  Exercise Action Plan Clinical staff conducted group or individual video education with verbal and written material and guidebook.  Patient learns the recommended strategies to achieve and enjoy long-term exercise adherence, including variety, self-motivation, self-efficacy, and positive decision making. Benefits of exercise include fitness, good health, weight management, more energy, better sleep, less stress, and overall well-being.  Medical   Heart Disease Risk  Reduction Clinical staff conducted group or individual video education with verbal and written material and guidebook.  Patient learns our heart is our most vital organ as it circulates oxygen, nutrients, white blood cells, and hormones throughout the entire body, and carries waste away. Data supports a plant-based eating plan like the Pritikin Program for its effectiveness in slowing progression of and reversing heart disease. The video provides a number of recommendations to address heart disease.   Metabolic Syndrome and Belly Fat  Clinical staff conducted group or individual video education with verbal and written material and guidebook.  Patient learns what metabolic syndrome is, how it leads to heart disease, and how one can reverse it  and keep it from coming back. You have metabolic syndrome if you have 3 of the following 5 criteria: abdominal obesity, high blood pressure, high triglycerides, low HDL cholesterol, and high blood sugar.  Hypertension and Heart Disease Clinical staff conducted group or individual video education with verbal and written material and guidebook.  Patient learns that high blood pressure, or hypertension, is very common in the [place]. Hypertension is largely due to excessive salt intake, but other important risk factors include being overweight, physical inactivity, drinking too much alcohol, smoking, and not eating enough potassium from fruits and vegetables. High blood pressure is a leading risk factor for heart attack, stroke, congestive heart failure, dementia, kidney failure, and premature death. Long-term effects of excessive salt intake include stiffening of the arteries and thickening of heart muscle and organ damage. Recommendations include ways to reduce hypertension and the risk of heart disease.  Diseases of Our Time - Focusing on Diabetes Clinical staff conducted group or individual video education with verbal and written material and guidebook.   Patient learns why the best way to stop diseases of our time is prevention, through food and other lifestyle changes. Medicine (such as prescription pills and surgeries) is often only a Band-Aid on the problem, not a long-term solution. Most common diseases of our time include obesity, type 2 diabetes, hypertension, heart disease, and cancer. The Pritikin Program is recommended and has been proven to help reduce, reverse, and/or prevent the damaging effects of metabolic syndrome.  Nutrition   Overview of the Pritikin Eating Plan  Clinical staff conducted group or individual video education with verbal and written material and guidebook.  Patient learns about the Pritikin Eating Plan for disease risk reduction. The Pritikin Eating Plan emphasizes a wide variety of unrefined, minimally-processed carbohydrates, like fruits, vegetables, whole grains, and legumes. Go, Caution, and Stop food choices are explained. Plant-based and lean animal proteins are emphasized. Rationale provided for low sodium intake for blood pressure control, low added sugars for blood sugar stabilization, and low added fats and oils for coronary artery disease risk reduction and weight management.  Calorie Density  Clinical staff conducted group or individual video education with verbal and written material and guidebook.  Patient learns about calorie density and how it impacts the Pritikin Eating Plan. Knowing the characteristics of the food you choose will help you decide whether those foods will lead to weight gain or weight loss, and whether you want to consume more or less of them. Weight loss is usually a side effect of the Pritikin Eating Plan because of its focus on low calorie-dense foods.  Label Reading  Clinical staff conducted group or individual video education with verbal and written material and guidebook.  Patient learns about the Pritikin recommended label reading guidelines and corresponding recommendations  regarding calorie density, added sugars, sodium content, and whole grains.  Dining Out - Part 1  Clinical staff conducted group or individual video education with verbal and written material and guidebook.  Patient learns that restaurant meals can be sabotaging because they can be so high in calories, fat, sodium, and/or sugar. Patient learns recommended strategies on how to positively address this and avoid unhealthy pitfalls.  Facts on Fats  Clinical staff conducted group or individual video education with verbal and written material and guidebook.  Patient learns that lifestyle modifications can be just as effective, if not more so, as many medications for lowering your risk of heart disease. A Pritikin lifestyle can help to reduce your risk  of inflammation and atherosclerosis (cholesterol build-up, or plaque, in the artery walls). Lifestyle interventions such as dietary choices and physical activity address the cause of atherosclerosis. A review of the types of fats and their impact on blood cholesterol levels, along with dietary recommendations to reduce fat intake is also included.  Nutrition Action Plan  Clinical staff conducted group or individual video education with verbal and written material and guidebook.  Patient learns how to incorporate Pritikin recommendations into their lifestyle. Recommendations include planning and keeping personal health goals in mind as an important part of their success.  Healthy Mind-Set    Healthy Minds, Bodies, Hearts  Clinical staff conducted group or individual video education with verbal and written material and guidebook.  Patient learns how to identify when they are stressed. Video will discuss the impact of that stress, as well as the many benefits of stress management. Patient will also be introduced to stress management techniques. The way we think, act, and feel has an impact on our hearts.  How Our Thoughts Can Heal Our Hearts  Clinical staff  conducted group or individual video education with verbal and written material and guidebook.  Patient learns that negative thoughts can cause depression and anxiety. This can result in negative lifestyle behavior and serious health problems. Cognitive behavioral therapy is an effective method to help control our thoughts in order to change and improve our emotional outlook.  Additional Videos:  Exercise    Improving Performance  Clinical staff conducted group or individual video education with verbal and written material and guidebook.  Patient learns to use a non-linear approach by alternating intensity levels and lengths of time spent exercising to help burn more calories and lose more body fat. Cardiovascular exercise helps improve heart health, metabolism, hormonal balance, blood sugar control, and recovery from fatigue. Resistance training improves strength, endurance, balance, coordination, reaction time, metabolism, and muscle mass. Flexibility exercise improves circulation, posture, and balance. Seek guidance from your physician and exercise physiologist before implementing an exercise routine and learn your capabilities and proper form for all exercise.  Introduction to Yoga  Clinical staff conducted group or individual video education with verbal and written material and guidebook.  Patient learns about yoga, a discipline of the coming together of mind, breath, and body. The benefits of yoga include improved flexibility, improved range of motion, better posture and core strength, increased lung function, weight loss, and positive self-image. Yoga's heart health benefits include lowered blood pressure, healthier heart rate, decreased cholesterol and triglyceride levels, improved immune function, and reduced stress. Seek guidance from your physician and exercise physiologist before implementing an exercise routine and learn your capabilities and proper form for all exercise.  Medical   Aging:  Enhancing Your Quality of Life  Clinical staff conducted group or individual video education with verbal and written material and guidebook.  Patient learns key strategies and recommendations to stay in good physical health and enhance quality of life, such as prevention strategies, having an advocate, securing a Health Care Proxy and Power of Attorney, and keeping a list of medications and system for tracking them. It also discusses how to avoid risk for bone loss.  Biology of Weight Control  Clinical staff conducted group or individual video education with verbal and written material and guidebook.  Patient learns that weight gain occurs because we consume more calories than we burn (eating more, moving less). Even if your body weight is normal, you may have higher ratios of fat compared to muscle mass.  Too much body fat puts you at increased risk for cardiovascular disease, heart attack, stroke, type 2 diabetes, and obesity-related cancers. In addition to exercise, following the Pritikin Eating Plan can help reduce your risk.  Decoding Lab Results  Clinical staff conducted group or individual video education with verbal and written material and guidebook.  Patient learns that lab test reflects one measurement whose values change over time and are influenced by many factors, including medication, stress, sleep, exercise, food, hydration, pre-existing medical conditions, and more. It is recommended to use the knowledge from this video to become more involved with your lab results and evaluate your numbers to speak with your doctor.   Diseases of Our Time - Overview  Clinical staff conducted group or individual video education with verbal and written material and guidebook.  Patient learns that according to the CDC, 50% to 70% of chronic diseases (such as obesity, type 2 diabetes, elevated lipids, hypertension, and heart disease) are avoidable through lifestyle improvements including healthier food  choices, listening to satiety cues, and increased physical activity.  Sleep Disorders Clinical staff conducted group or individual video education with verbal and written material and guidebook.  Patient learns how good quality and duration of sleep are important to overall health and well-being. Patient also learns about sleep disorders and how they impact health along with recommendations to address them, including discussing with a physician.  Nutrition  Dining Out - Part 2 Clinical staff conducted group or individual video education with verbal and written material and guidebook.  Patient learns how to plan ahead and communicate in order to maximize their dining experience in a healthy and nutritious manner. Included are recommended food choices based on the type of restaurant the patient is visiting.   Fueling a Banker conducted group or individual video education with verbal and written material and guidebook.  There is a strong connection between our food choices and our health. Diseases like obesity and type 2 diabetes are very prevalent and are in large-part due to lifestyle choices. The Pritikin Eating Plan provides plenty of food and hunger-curbing satisfaction. It is easy to follow, affordable, and helps reduce health risks.  Menu Workshop  Clinical staff conducted group or individual video education with verbal and written material and guidebook.  Patient learns that restaurant meals can sabotage health goals because they are often packed with calories, fat, sodium, and sugar. Recommendations include strategies to plan ahead and to communicate with the manager, chef, or server to help order a healthier meal.  Planning Your Eating Strategy  Clinical staff conducted group or individual video education with verbal and written material and guidebook.  Patient learns about the Pritikin Eating Plan and its benefit of reducing the risk of disease. The Pritikin Eating  Plan does not focus on calories. Instead, it emphasizes high-quality, nutrient-rich foods. By knowing the characteristics of the foods, we choose, we can determine their calorie density and make informed decisions.  Targeting Your Nutrition Priorities  Clinical staff conducted group or individual video education with verbal and written material and guidebook.  Patient learns that lifestyle habits have a tremendous impact on disease risk and progression. This video provides eating and physical activity recommendations based on your personal health goals, such as reducing LDL cholesterol, losing weight, preventing or controlling type 2 diabetes, and reducing high blood pressure.  Vitamins and Minerals  Clinical staff conducted group or individual video education with verbal and written material and guidebook.  Patient learns different  ways to obtain key vitamins and minerals, including through a recommended healthy diet. It is important to discuss all supplements you take with your doctor.   Healthy Mind-Set    Smoking Cessation  Clinical staff conducted group or individual video education with verbal and written material and guidebook.  Patient learns that cigarette smoking and tobacco addiction pose a serious health risk which affects millions of people. Stopping smoking will significantly reduce the risk of heart disease, lung disease, and many forms of cancer. Recommended strategies for quitting are covered, including working with your doctor to develop a successful plan.  Culinary   Becoming a Set designer conducted group or individual video education with verbal and written material and guidebook.  Patient learns that cooking at home can be healthy, cost-effective, quick, and puts them in control. Keys to cooking healthy recipes will include looking at your recipe, assessing your equipment needs, planning ahead, making it simple, choosing cost-effective seasonal ingredients,  and limiting the use of added fats, salts, and sugars.  Cooking - Breakfast and Snacks  Clinical staff conducted group or individual video education with verbal and written material and guidebook.  Patient learns how important breakfast is to satiety and nutrition through the entire day. Recommendations include key foods to eat during breakfast to help stabilize blood sugar levels and to prevent overeating at meals later in the day. Planning ahead is also a key component.  Cooking - Educational psychologist conducted group or individual video education with verbal and written material and guidebook.  Patient learns eating strategies to improve overall health, including an approach to cook more at home. Recommendations include thinking of animal protein as a side on your plate rather than center stage and focusing instead on lower calorie dense options like vegetables, fruits, whole grains, and plant-based proteins, such as beans. Making sauces in large quantities to freeze for later and leaving the skin on your vegetables are also recommended to maximize your experience.  Cooking - Healthy Salads and Dressing Clinical staff conducted group or individual video education with verbal and written material and guidebook.  Patient learns that vegetables, fruits, whole grains, and legumes are the foundations of the Pritikin Eating Plan. Recommendations include how to incorporate each of these in flavorful and healthy salads, and how to create homemade salad dressings. Proper handling of ingredients is also covered. Cooking - Soups and [employer] - Soups and Desserts Clinical staff conducted group or individual video education with verbal and written material and guidebook.  Patient learns that Pritikin soups and desserts make for easy, nutritious, and delicious snacks and meal components that are low in sodium, fat, sugar, and calorie density, while high in vitamins, minerals, and filling  fiber. Recommendations include simple and healthy ideas for soups and desserts.   Overview     The Pritikin Solution Program Overview Clinical staff conducted group or individual video education with verbal and written material and guidebook.  Patient learns that the results of the Pritikin Program have been documented in more than 100 articles published in peer-reviewed journals, and the benefits include reducing risk factors for (and, in some cases, even reversing) high cholesterol, high blood pressure, type 2 diabetes, obesity, and more! An overview of the three key pillars of the Pritikin Program will be covered: eating well, doing regular exercise, and having a healthy mind-set.  WORKSHOPS  Exercise: Exercise Basics: Building Your Action Plan Clinical staff led group instruction and group discussion with  PowerPoint presentation and patient guidebook. To enhance the learning environment the use of posters, models and videos may be added. At the conclusion of this workshop, patients will comprehend the difference between physical activity and exercise, as well as the benefits of incorporating both, into their routine. Patients will understand the FITT (Frequency, Intensity, Time, and Type) principle and how to use it to build an exercise action plan. In addition, safety concerns and other considerations for exercise and cardiac rehab will be addressed by the presenter. The purpose of this lesson is to promote a comprehensive and effective weekly exercise routine in order to improve patients' overall level of fitness.   Managing Heart Disease: Your Path to a Healthier Heart Clinical staff led group instruction and group discussion with PowerPoint presentation and patient guidebook. To enhance the learning environment the use of posters, models and videos may be added.At the conclusion of this workshop, patients will understand the anatomy and physiology of the heart. Additionally, they will  understand how Pritikin's three pillars impact the risk factors, the progression, and the management of heart disease.  The purpose of this lesson is to provide a high-level overview of the heart, heart disease, and how the Pritikin lifestyle positively impacts risk factors.  Exercise Biomechanics Clinical staff led group instruction and group discussion with PowerPoint presentation and patient guidebook. To enhance the learning environment the use of posters, models and videos may be added. Patients will learn how the structural parts of their bodies function and how these functions impact their daily activities, movement, and exercise. Patients will learn how to promote a neutral spine, learn how to manage pain, and identify ways to improve their physical movement in order to promote healthy living. The purpose of this lesson is to expose patients to common physical limitations that impact physical activity. Participants will learn practical ways to adapt and manage aches and pains, and to minimize their effect on regular exercise. Patients will learn how to maintain good posture while sitting, walking, and lifting.  Balance Training and Fall Prevention  Clinical staff led group instruction and group discussion with PowerPoint presentation and patient guidebook. To enhance the learning environment the use of posters, models and videos may be added. At the conclusion of this workshop, patients will understand the importance of their sensorimotor skills (vision, proprioception, and the vestibular system) in maintaining their ability to balance as they age. Patients will apply a variety of balancing exercises that are appropriate for their current level of function. Patients will understand the common causes for poor balance, possible solutions to these problems, and ways to modify their physical environment in order to minimize their fall risk. The purpose of this lesson is to teach patients  about the importance of maintaining balance as they age and ways to minimize their risk of falling.  WORKSHOPS   Nutrition:  Fueling a Ship broker led group instruction and group discussion with PowerPoint presentation and patient guidebook. To enhance the learning environment the use of posters, models and videos may be added. Patients will review the foundational principles of the Pritikin Eating Plan and understand what constitutes a serving size in each of the food groups. Patients will also learn Pritikin-friendly foods that are better choices when away from home and review make-ahead meal and snack options. Calorie density will be reviewed and applied to three nutrition priorities: weight maintenance, weight loss, and weight gain. The purpose of this lesson is to reinforce (in a group setting) the key concepts  around what patients are recommended to eat and how to apply these guidelines when away from home by planning and selecting Pritikin-friendly options. Patients will understand how calorie density may be adjusted for different weight management goals.  Mindful Eating  Clinical staff led group instruction and group discussion with PowerPoint presentation and patient guidebook. To enhance the learning environment the use of posters, models and videos may be added. Patients will briefly review the concepts of the Pritikin Eating Plan and the importance of low-calorie dense foods. The concept of mindful eating will be introduced as well as the importance of paying attention to internal hunger signals. Triggers for non-hunger eating and techniques for dealing with triggers will be explored. The purpose of this lesson is to provide patients with the opportunity to review the basic principles of the Pritikin Eating Plan, discuss the value of eating mindfully and how to measure internal cues of hunger and fullness using the Hunger Scale. Patients will also discuss reasons for non-hunger  eating and learn strategies to use for controlling emotional eating.  Targeting Your Nutrition Priorities Clinical staff led group instruction and group discussion with PowerPoint presentation and patient guidebook. To enhance the learning environment the use of posters, models and videos may be added. Patients will learn how to determine their genetic susceptibility to disease by reviewing their family history. Patients will gain insight into the importance of diet as part of an overall healthy lifestyle in mitigating the impact of genetics and other environmental insults. The purpose of this lesson is to provide patients with the opportunity to assess their personal nutrition priorities by looking at their family history, their own health history and current risk factors. Patients will also be able to discuss ways of prioritizing and modifying the Pritikin Eating Plan for their highest risk areas  Menu  Clinical staff led group instruction and group discussion with PowerPoint presentation and patient guidebook. To enhance the learning environment the use of posters, models and videos may be added. Using menus brought in from [employer], or printed from [employer], patients will apply the Pritikin dining out guidelines that were presented in the [employer] video. Patients will also be able to practice these guidelines in a variety of provided scenarios. The purpose of this lesson is to provide patients with the opportunity to practice hands-on learning of the Pritikin Dining Out guidelines with actual menus and practice scenarios.  Label Reading Clinical staff led group instruction and group discussion with PowerPoint presentation and patient guidebook. To enhance the learning environment the use of posters, models and videos may be added. Patients will review and discuss the Pritikin label reading guidelines presented in [employer]ritikin's Label Reading Educational series video.  Using fool labels brought in from local grocery stores and markets, patients will apply the label reading guidelines and determine if the packaged food meet the Pritikin guidelines. The purpose of this lesson is to provide patients with the opportunity to review, discuss, and practice hands-on learning of the Pritikin Label Reading guidelines with actual packaged food labels. Cooking School  Pritikin's [employer] are designed to teach patients ways to prepare quick, simple, and affordable recipes at home. The importance of nutrition's role in chronic disease risk reduction is reflected in its emphasis in the overall Pritikin program. By learning how to prepare essential core Pritikin Eating Plan recipes, patients will increase control over what they eat; be able to customize the flavor of foods without the use of added salt, sugar,  or fat; and improve the quality of the food they consume. By learning a set of core recipes which are easily assembled, quickly prepared, and affordable, patients are more likely to prepare more healthy foods at home. These workshops focus on convenient breakfasts, simple entres, side dishes, and desserts which can be prepared with minimal effort and are consistent with nutrition recommendations for cardiovascular risk reduction. Cooking [employer] are taught by a Armed forces logistics/support/administrative officer (RD) who has been trained by the [employer]. The chef or RD has a clear understanding of the importance of minimizing - if not completely eliminating - added fat, sugar, and sodium in recipes. Throughout the series of Cooking School Workshop sessions, patients will learn about healthy ingredients and efficient methods of cooking to build confidence in their capability to prepare    Cooking School weekly topics:  Adding Flavor- Sodium-Free  Fast and Healthy Breakfasts  Powerhouse Plant-Based Proteins  Satisfying Salads and Dressings  Simple Sides and  Sauces  International Cuisine-Spotlight on the [employer] Zones  Delicious Desserts  Savory Soups  [employer] - Meals in a Astronomer Appetizers and Snacks  Comforting Weekend Breakfasts  One-Pot Wonders   Fast Evening Meals  Landscape architect Your Pritikin Plate  WORKSHOPS   Healthy Mindset (Psychosocial):  Focused Goals, Sustainable Changes Clinical staff led group instruction and group discussion with PowerPoint presentation and patient guidebook. To enhance the learning environment the use of posters, models and videos may be added. Patients will be able to apply effective goal setting strategies to establish at least one personal goal, and then take consistent, meaningful action toward that goal. They will learn to identify common barriers to achieving personal goals and develop strategies to overcome them. Patients will also gain an understanding of how our mind-set can impact our ability to achieve goals and the importance of cultivating a positive and growth-oriented mind-set. The purpose of this lesson is to provide patients with a deeper understanding of how to set and achieve personal goals, as well as the tools and strategies needed to overcome common obstacles which may arise along the way.  From Head to Heart: The Power of a Healthy Outlook  Clinical staff led group instruction and group discussion with PowerPoint presentation and patient guidebook. To enhance the learning environment the use of posters, models and videos may be added. Patients will be able to recognize and describe the impact of emotions and mood on physical health. They will discover the importance of self-care and explore self-care practices which may work for them. Patients will also learn how to utilize the 4 C's to cultivate a healthier outlook and better manage stress and challenges. The purpose of this lesson is to demonstrate to patients how a healthy outlook is an essential part of  maintaining good health, especially as they continue their cardiac rehab journey.  Healthy Sleep for a Healthy Heart Clinical staff led group instruction and group discussion with PowerPoint presentation and patient guidebook. To enhance the learning environment the use of posters, models and videos may be added. At the conclusion of this workshop, patients will be able to demonstrate knowledge of the importance of sleep to overall health, well-being, and quality of life. They will understand the symptoms of, and treatments for, common sleep disorders. Patients will also be able to identify daytime and nighttime behaviors which impact sleep, and they will be able to apply these tools to help manage sleep-related challenges. The purpose of this  lesson is to provide patients with a general overview of sleep and outline the importance of quality sleep. Patients will learn about a few of the most common sleep disorders. Patients will also be introduced to the concept of "sleep hygiene," and discover ways to self-manage certain sleeping problems through simple daily behavior changes. Finally, the workshop will motivate patients by clarifying the links between quality sleep and their goals of heart-healthy living.   Recognizing and Reducing Stress Clinical staff led group instruction and group discussion with PowerPoint presentation and patient guidebook. To enhance the learning environment the use of posters, models and videos may be added. At the conclusion of this workshop, patients will be able to understand the types of stress reactions, differentiate between acute and chronic stress, and recognize the impact that chronic stress has on their health. They will also be able to apply different coping mechanisms, such as reframing negative self-talk. Patients will have the opportunity to practice a variety of stress management techniques, such as deep abdominal breathing, progressive muscle relaxation, and/or  guided imagery.  The purpose of this lesson is to educate patients on the role of stress in their lives and to provide healthy techniques for coping with it.  Learning Barriers/Preferences:  Learning Barriers/Preferences - 05/28/22 1610       Learning Barriers/Preferences   Learning Barriers None    Learning Preferences None             Education Topics:  Knowledge Questionnaire Score:  Knowledge Questionnaire Score - 05/28/22 0812       Knowledge Questionnaire Score   Pre Score 19/24             Core Components/Risk Factors/Patient Goals at Admission:  Personal Goals and Risk Factors at Admission - 05/28/22 0951       Core Components/Risk Factors/Patient Goals on Admission   Lipids Yes    Intervention Provide education and support for participant on nutrition & aerobic/resistive exercise along with prescribed medications to achieve LDL 70mg , HDL >40mg .    Expected Outcomes Short Term: Participant states understanding of desired cholesterol values and is compliant with medications prescribed. Participant is following exercise prescription and nutrition guidelines.;Long Term: Cholesterol controlled with medications as prescribed, with individualized exercise RX and with personalized nutrition plan. Value goals: LDL < 70mg , HDL > 40 mg.             Core Components/Risk Factors/Patient Goals Review:   Goals and Risk Factor Review     Row Name 06/03/22 1659 06/13/22 1249 07/09/22 1403         Core Components/Risk Factors/Patient Goals Review   Personal Goals Review Lipids Lipids;Heart Failure Lipids;Heart Failure     Review Leonta started intensive cardiac rehab on 06/03/22. Petra did well with exercise, vital signs were stable Ode started intensive cardiac rehab on 06/03/22. Shayla is off to a good start to  exercise, vital signs have been stable. Elbin continues to do well with exercise at   intensive cardiac rehab on 06/03/22. Devonn's , vital signs remain  stable. Mardy will complete intensive cardiac rehab opn 06/23/21 due to his scheduled ICD implant.     Expected Outcomes Jamiel will continue to participate in intensive cardiac rehab for exercise, nutrition and lifestyle modificaitons Ignazio will continue to participate in intensive cardiac rehab for exercise, nutrition and lifestyle modificaitons Dalontae will continue to participate in intensive cardiac rehab for exercise, nutrition and lifestyle modificaitons  Core Components/Risk Factors/Patient Goals at Discharge (Final Review):   Goals and Risk Factor Review - 07/09/22 1403       Core Components/Risk Factors/Patient Goals Review   Personal Goals Review Lipids;Heart Failure    Review Skiler continues to do well with exercise at   intensive cardiac rehab on 06/03/22. Raunel's , vital signs remain stable. Remington will complete intensive cardiac rehab opn 06/23/21 due to his scheduled ICD implant.    Expected Outcomes Kilyn will continue to participate in intensive cardiac rehab for exercise, nutrition and lifestyle modificaitons             ITP Comments:  ITP Comments     Row Name 05/28/22 0740 06/03/22 1656 06/13/22 1240 07/09/22 1357     ITP Comments Medical Director- Dr. Armanda Magic, MD. Oriented patient to [facility]ikin Education Program/ Intensive Cardiac Rehab program. Reviewed orientation packet with patient. 30 Day ITP Review. Jlynn started intensive cardiac on 06/03/22 and did well with exercise. 30 Day ITP Review. Koree started intensive cardiac on 06/03/22 and is off to a good start to exercise. 30 Day ITP Review. Jelon has good attendance and participation in intensive cardiac rehab. Jareb will complete cardiac rehab on 06/24/22 due to ICD placement.             Comments: See ITP Comments

## 2022-07-10 ENCOUNTER — Encounter (HOSPITAL_COMMUNITY)
Admission: RE | Admit: 2022-07-10 | Discharge: 2022-07-10 | Disposition: A | Discharge status: Home / Self Care | Payer: Medicare Other | Source: Ambulatory Visit | Attending: Internal Medicine | Admitting: Internal Medicine

## 2022-07-10 DIAGNOSIS — Z951 Presence of aortocoronary bypass graft: Secondary | ICD-10-CM | POA: Diagnosis not present

## 2022-07-10 DIAGNOSIS — I213 ST elevation (STEMI) myocardial infarction of unspecified site: Secondary | ICD-10-CM

## 2022-07-12 ENCOUNTER — Encounter (HOSPITAL_COMMUNITY)
Admission: RE | Admit: 2022-07-12 | Discharge: 2022-07-12 | Disposition: A | Discharge status: Home / Self Care | Payer: Medicare Other | Source: Ambulatory Visit | Attending: Internal Medicine | Admitting: Internal Medicine

## 2022-07-12 DIAGNOSIS — I213 ST elevation (STEMI) myocardial infarction of unspecified site: Secondary | ICD-10-CM

## 2022-07-12 DIAGNOSIS — Z951 Presence of aortocoronary bypass graft: Secondary | ICD-10-CM | POA: Diagnosis not present

## 2022-07-15 ENCOUNTER — Encounter (HOSPITAL_COMMUNITY)
Admission: RE | Admit: 2022-07-15 | Discharge: 2022-07-15 | Disposition: A | Discharge status: Home / Self Care | Payer: Medicare Other | Source: Ambulatory Visit | Attending: Internal Medicine | Admitting: Internal Medicine

## 2022-07-15 DIAGNOSIS — I213 ST elevation (STEMI) myocardial infarction of unspecified site: Secondary | ICD-10-CM

## 2022-07-15 DIAGNOSIS — Z951 Presence of aortocoronary bypass graft: Secondary | ICD-10-CM | POA: Diagnosis not present

## 2022-07-17 ENCOUNTER — Telehealth: Payer: Self-pay | Admitting: Internal Medicine

## 2022-07-17 ENCOUNTER — Encounter (HOSPITAL_COMMUNITY)
Admission: RE | Admit: 2022-07-17 | Discharge: 2022-07-17 | Disposition: A | Discharge status: Home / Self Care | Payer: Medicare Other | Source: Ambulatory Visit | Attending: Internal Medicine | Admitting: Internal Medicine

## 2022-07-17 DIAGNOSIS — Z951 Presence of aortocoronary bypass graft: Secondary | ICD-10-CM | POA: Diagnosis not present

## 2022-07-17 DIAGNOSIS — I213 ST elevation (STEMI) myocardial infarction of unspecified site: Secondary | ICD-10-CM

## 2022-07-17 NOTE — Telephone Encounter (Signed)
Patient would like a call back to discuss upcoming procedure and the appts prior to. Please advise.

## 2022-07-17 NOTE — Telephone Encounter (Signed)
Patient was calling because lab appointment and procedure was scheduled for the same day and he thought the labs needed to be done a week prior to the procedure. I did inform him that labs needs to be done before procedure and rescheduled labs for 07/24/22

## 2022-07-19 ENCOUNTER — Encounter (HOSPITAL_COMMUNITY)
Admission: RE | Admit: 2022-07-19 | Discharge: 2022-07-19 | Disposition: A | Discharge status: Home / Self Care | Payer: Medicare Other | Source: Ambulatory Visit | Attending: Internal Medicine | Admitting: Internal Medicine

## 2022-07-19 VITALS — Ht 67.75 in | Wt 186.3 lb

## 2022-07-19 DIAGNOSIS — Z951 Presence of aortocoronary bypass graft: Secondary | ICD-10-CM

## 2022-07-19 DIAGNOSIS — I213 ST elevation (STEMI) myocardial infarction of unspecified site: Secondary | ICD-10-CM

## 2022-07-22 ENCOUNTER — Encounter (HOSPITAL_COMMUNITY): Payer: Medicare Other | Admitting: Cardiology

## 2022-07-22 ENCOUNTER — Encounter (HOSPITAL_COMMUNITY)
Admission: RE | Admit: 2022-07-22 | Discharge: 2022-07-22 | Disposition: A | Discharge status: Home / Self Care | Payer: Medicare Other | Source: Ambulatory Visit | Attending: Internal Medicine | Admitting: Internal Medicine

## 2022-07-22 DIAGNOSIS — I213 ST elevation (STEMI) myocardial infarction of unspecified site: Secondary | ICD-10-CM

## 2022-07-22 DIAGNOSIS — Z951 Presence of aortocoronary bypass graft: Secondary | ICD-10-CM

## 2022-07-24 ENCOUNTER — Ambulatory Visit
Admission: RE | Admit: 2022-07-24 | Discharge: 2022-07-24 | Disposition: A | Discharge status: Home / Self Care | Payer: Medicare Other | Source: Ambulatory Visit | Attending: Cardiology | Admitting: Cardiology

## 2022-07-24 ENCOUNTER — Encounter (HOSPITAL_COMMUNITY): Payer: Self-pay | Admitting: Cardiology

## 2022-07-24 ENCOUNTER — Ambulatory Visit: Payer: Medicare Other

## 2022-07-24 ENCOUNTER — Encounter (HOSPITAL_COMMUNITY): Payer: Medicare Other

## 2022-07-24 ENCOUNTER — Telehealth (HOSPITAL_COMMUNITY): Payer: Self-pay

## 2022-07-24 ENCOUNTER — Other Ambulatory Visit (HOSPITAL_COMMUNITY): Payer: Self-pay

## 2022-07-24 ENCOUNTER — Other Ambulatory Visit: Payer: Self-pay

## 2022-07-24 VITALS — BP 102/60 | HR 78 | Wt 186.8 lb

## 2022-07-24 DIAGNOSIS — N1831 Chronic kidney disease, stage 3a: Secondary | ICD-10-CM | POA: Insufficient documentation

## 2022-07-24 DIAGNOSIS — Z951 Presence of aortocoronary bypass graft: Secondary | ICD-10-CM | POA: Diagnosis not present

## 2022-07-24 DIAGNOSIS — Z7982 Long term (current) use of aspirin: Secondary | ICD-10-CM | POA: Diagnosis not present

## 2022-07-24 DIAGNOSIS — I5022 Chronic systolic (congestive) heart failure: Secondary | ICD-10-CM | POA: Insufficient documentation

## 2022-07-24 DIAGNOSIS — I255 Ischemic cardiomyopathy: Secondary | ICD-10-CM | POA: Insufficient documentation

## 2022-07-24 DIAGNOSIS — Z7984 Long term (current) use of oral hypoglycemic drugs: Secondary | ICD-10-CM | POA: Diagnosis not present

## 2022-07-24 DIAGNOSIS — Z79899 Other long term (current) drug therapy: Secondary | ICD-10-CM | POA: Insufficient documentation

## 2022-07-24 DIAGNOSIS — K219 Gastro-esophageal reflux disease without esophagitis: Secondary | ICD-10-CM | POA: Insufficient documentation

## 2022-07-24 DIAGNOSIS — Z7901 Long term (current) use of anticoagulants: Secondary | ICD-10-CM | POA: Diagnosis not present

## 2022-07-24 DIAGNOSIS — I252 Old myocardial infarction: Secondary | ICD-10-CM | POA: Diagnosis not present

## 2022-07-24 DIAGNOSIS — I2510 Atherosclerotic heart disease of native coronary artery without angina pectoris: Secondary | ICD-10-CM | POA: Insufficient documentation

## 2022-07-24 DIAGNOSIS — Z86711 Personal history of pulmonary embolism: Secondary | ICD-10-CM | POA: Insufficient documentation

## 2022-07-24 DIAGNOSIS — Z8616 Personal history of COVID-19: Secondary | ICD-10-CM | POA: Insufficient documentation

## 2022-07-24 DIAGNOSIS — I5021 Acute systolic (congestive) heart failure: Secondary | ICD-10-CM | POA: Diagnosis not present

## 2022-07-24 LAB — CBC
HCT: 47.1 % (ref 39.0–52.0)
Hemoglobin: 15.1 g/dL (ref 13.0–17.0)
MCH: 27.5 pg (ref 26.0–34.0)
MCHC: 32.1 g/dL (ref 30.0–36.0)
MCV: 85.6 fL (ref 80.0–100.0)
Platelets: 243 10*3/uL (ref 150–400)
RBC: 5.5 MIL/uL (ref 4.22–5.81)
RDW: 14.8 % (ref 11.5–15.5)
WBC: 5.3 10*3/uL (ref 4.0–10.5)
nRBC: 0 % (ref 0.0–0.2)

## 2022-07-24 LAB — BASIC METABOLIC PANEL
Anion gap: 10 (ref 5–15)
BUN: 21 mg/dL (ref 8–23)
CO2: 21 mmol/L — ABNORMAL LOW (ref 22–32)
Calcium: 9.5 mg/dL (ref 8.9–10.3)
Chloride: 106 mmol/L (ref 98–111)
Creatinine, Ser: 1.56 mg/dL — ABNORMAL HIGH (ref 0.61–1.24)
GFR, Estimated: 49 mL/min — ABNORMAL LOW (ref >60)
Glucose, Bld: 93 mg/dL (ref 70–99)
Potassium: 4.3 mmol/L (ref 3.5–5.1)
Sodium: 137 mmol/L (ref 135–145)

## 2022-07-24 LAB — BRAIN NATRIURETIC PEPTIDE: B Natriuretic Peptide: 549.1 pg/mL — ABNORMAL HIGH (ref 0.0–100.0)

## 2022-07-24 MED ORDER — ENTRESTO 24-26 MG PO TABS: 1.0000 | ORAL_TABLET | Freq: Two times a day (BID) | ORAL | 11 refills | Status: DC

## 2022-07-24 NOTE — Progress Notes (Signed)
[facility] CONSULT NOTE  Primary Care: Jackelyn Poling, DO HF Cardiologist: Dr. Shirlee Latch  HPI: 67 y.o. male with a history of arthritis, GERD and submassive PE 3/23 treated with catheter directed thrombolysis, CKD stage IIIa, CAD s/p CABG x 2, and HFrEF.   Admitted 1/24 w/ acute anterolateral STEMI. Incidentally found to be COVID +. Emergent cath showed high grade ostial LAD to proximal LAD lesion in the 80% stenosis range as well as a 80% ramus intermedius lesion. Ventriculography demonstrated an anterior wall motion abnormality with an LVEDP of 22 mmHg. Given that the patient had TIMI-3 flow in all vessels, percutaneous intervention was deferred. Felt best suited for surgical revascularization. IABP placed and referred for CABG. Pre-op echo showed severely reduced LVEF 25-30%, RV normal, trivial MR w/ AK of the mid and distal anterior wall, mid and distal lateral wall, mid and distal  anterior septum, entire apex, mid anterolateral segment, and mid inferoseptal walls (prior Echo 3/23 in setting of submassive PE w/ mod reduced RV and LVEF 50-55%, repeat limited echo 6/23 showed RV function returned to normal w/ LVEF up to 60-65%). He underwent CABG x 2 w/ LIMA-LAD and SVG-ramus. Intra-op, he developed acute worsening of LV and RV function as well as acute rise in PA pressure (PASP 80s-90s).  Difficult to wean off bypass, but successful wean with iNO, inotropes, pressors, IABP.  Eventually able to wean pressors and remove IABP. Hospitalization complicated by pseudomonas bacteremia, ID consulted and completed IV abx (unclear source). GDMT titrated but limited by AKI. He was discharged home, weight 183 lbs.  Repeat echo in 4/24 showed EF < 20%, mild LV dilation, mild RV dilation with normal RV systolic function, mild MR, normal IVC.   Patient returns today for followup of CHF.  Weight up 4 lbs.  He is doing cardiac rehab.  Stamina improving.  He is short of breath walking up hills, does fine  walking on flat ground and walking up a flight of stairs.  No orthopnea/PND.  No chest pain. No lightheadedness.   Labs (1/24): K 4.4, creatinine 1.73 Labs (2/24): K 4  Creatinine 1.45  Labs (4/24): K 4.5, creatinine 1.53, LDL 52  ECG (personally reviewed): NSR, old anterolateral MI  PMH: 1. CKD stage 3 2. Hyperlipidemia 3. CAD: Acute anterolateral STEMI in 1/24.  - LHC with 80% ostial/proximal LAD and 80% ostial ramus.  - CABG with LIMA-LAD and SVG-ramus.  4. Pseudomonas bacteremia (1/24) post-op CABG 5. PE: Submassive in 3/23 6. Chronic systolic CHF: Ischemic cardiomyopathy.  - Echo (6/23): EF 60-65%, normal RV systolic function  - Echo (1/24): EF 25-30%, grade II DD, normal RV  - Echo (4/24): EF < 20%, mild LV dilation, mild RV dilation with normal RV systolic function, mild MR, normal IVC.   Current Outpatient Medications  Medication Sig Dispense Refill   acetaminophen (TYLENOL) 500 MG tablet Take 500 mg by mouth every 6 (six) hours as needed.     apixaban (ELIQUIS) 5 MG TABS tablet Take 1 tablet (5 mg total) by mouth 2 (two) times daily. 180 tablet 3   Ascorbic Acid (VITAMIN C PO) Take 1 tablet by mouth daily.     aspirin EC 81 MG tablet Take 1 tablet (81 mg total) by mouth daily. Swallow whole. 30 tablet 12   Atorvastatin Calcium (LIPITOR PO) Take 80 mg by mouth daily.     Cholecalciferol (VITAMIN D-3 PO) Take 1 tablet by mouth daily.     Coenzyme Q10 (CO Q-10 PO)  Take 1 capsule by mouth daily.     famotidine-calcium carbonate-magnesium hydroxide (PEPCID COMPLETE) 10-800-165 MG chewable tablet Chew 1-2 tablets by mouth 2 (two) times daily as needed (acid reflux).     FARXIGA 10 MG TABS tablet TAKE 1 TABLET(10 MG) BY MOUTH DAILY 30 tablet 11   furosemide (LASIX) 40 MG tablet Take 1 tablet (40 mg total) by mouth as needed (Take one tablet if weight gain of 3 pounds overnight or greater than 5 pounds in 1 week). 30 tablet 11   Menaquinone-7 (VITAMIN K2 PO) Take 1 tablet by mouth  daily.     metoprolol succinate (TOPROL XL) 25 MG 24 hr tablet Take 1/2 tablet (12.5 mg total) by mouth daily. 45 tablet 3   sacubitril-valsartan (ENTRESTO) 24-26 MG Take 1 tablet by mouth 2 (two) times daily. 60 tablet 11   sildenafil (REVATIO) 20 MG tablet Take 20 mg by mouth as needed (erectile dysfunction).     spironolactone (ALDACTONE) 25 MG tablet TAKE 1 TABLET(25 MG) BY MOUTH DAILY 90 tablet 0   rosuvastatin (CRESTOR) 40 MG tablet Take 1 tablet (40 mg total) by mouth daily. (Patient not taking: Reported on 07/24/2022) 90 tablet 3   No current facility-administered medications for this encounter.   No Known Allergies  Social History   Socioeconomic History   Marital status: Married    Spouse name: Not on file   Number of children: Not on file   Years of education: Not on file   Highest education level: Not on file  Occupational History   Not on file  Tobacco Use   Smoking status: Never   Smokeless tobacco: Never  Vaping Use   Vaping Use: Never used  Substance and Sexual Activity   Alcohol use: No   Drug use: No   Sexual activity: Not on file  Other Topics Concern   Not on file  Social History Narrative   Not on file   Social Determinants of Health   Financial Resource Strain: Not on file  Food Insecurity: No Food Insecurity (03/10/2022)   Hunger Vital Sign    Worried About Running Out of Food in the Last Year: Never true    Ran Out of Food in the Last Year: Never true  Transportation Needs: No Transportation Needs (03/10/2022)   PRAPARE - Administrator, Civil Service (Medical): No    Lack of Transportation (Non-Medical): No  Physical Activity: Not on file  Stress: Not on file  Social Connections: Not on file  Intimate Partner Violence: Not At Risk (03/10/2022)   Humiliation, Afraid, Rape, and Kick questionnaire    Fear of Current or Ex-Partner: No    Emotionally Abused: No    Physically Abused: No    Sexually Abused: No   Family History  Problem  Relation Age of Onset   CAD Brother    Hypertension Other    Diabetes Neg Hx    BP 102/60   Pulse 78   Wt 84.7 kg (186 lb 12.8 oz)   SpO2 98%   BMI 28.61 kg/m   Wt Readings from Last 3 Encounters:  07/24/22 84.7 kg (186 lb 12.8 oz)  07/19/22 84.5 kg (186 lb 4.6 oz)  07/08/22 84.7 kg (186 lb 12.8 oz)  General: NAD Neck: No JVD, no thyromegaly or thyroid nodule.  Lungs: Clear to auscultation bilaterally with normal respiratory effort. CV: Nondisplaced PMI.  Heart regular S1/S2, no S3/S4, no murmur.  No peripheral edema.  No carotid bruit.  Normal pedal pulses.  Abdomen: Soft, nontender, no hepatosplenomegaly, no distention.  Skin: Intact without lesions or rashes.  Neurologic: Alert and oriented x 3.  Psych: Normal affect. Extremities: No clubbing or cyanosis.  HEENT: Normal.   ASSESSMENT & PLAN: 1. Chronic systolic CHF:  Ischemic cardiomyopathy. Echo in 1/24 with LV EF 25-30%, wall motion abnormalities, normal RV.  Echo in 4/24 showed EF < 20%, mild LV dilation, mild RV dilation with normal RV systolic function, mild MR, normal IVC.  He is not volume overloaded on exam, NYHA class II symptoms.  - He has seen EP and will have ICD placed.  - Stop losartan, start Entresto 24/26 bid.  BMET/BNP today and BMET in 10 days.  - Continue Farxiga 10 mg daily. - Continue spironolactone 25 mg daily. - He does not need a regular diuretic.  - Continue Toprol XL 12.5 mg daily.  2. CAD: s/p anterolateral MI in 1/24 with cath showing 80% ostial LAD stenosis and 80% ostial large ramus stenosis.  Ostial lesions in close proximity made PCI very difficult and CABG was elected.  Underwent LIMA-LAD and SVG-ramus on 03/12/22. No chest pain.  - Continue ASA 81 daily (stop 6 mos-1 year post-CABG since he will continue Eliquis).  - Continue Crestor 40 mg daily.  - Continue cardiac rehab.   3. H/o PE: Submassive PE in 3/23, treated with catheter-directed thrombolysis.  - Continue Eliquis.  4. CKD Stage III:  BMET today.   Follow in 3 wks with HF pharmacist for medication titration.  See APP in 6 wks.   Marca Ancona  07/24/22

## 2022-07-24 NOTE — Patient Instructions (Signed)
STOP Losartan  START Entresto 24/26 mg Twice daily  Labs done today, your results will be available in MyChart, we will contact you for abnormal readings.  Repeat blood work in 10 days.  Please follow up with our heart failure pharmacist in 3 weeks  Your physician recommends that you schedule a follow-up appointment in: 6 weeks  If you have any questions or concerns before your next appointment please send [place] a message through [place] or call our office at [phone].    TO LEAVE A MESSAGE FOR THE NURSE SELECT OPTION 2, PLEASE LEAVE A MESSAGE INCLUDING: YOUR NAME DATE OF BIRTH CALL BACK NUMBER REASON FOR CALL**this is important as we prioritize the call backs  YOU WILL RECEIVE A CALL BACK THE SAME DAY AS LONG AS YOU CALL BEFORE 4:00 PM  At the [facility], you and your health needs are our priority. As part of our continuing mission to provide you with exceptional heart care, we have created designated Provider Care Teams. These Care Teams include your primary Cardiologist (physician) and Advanced Practice Providers (APPs- Physician Assistants and Nurse Practitioners) who all work together to provide you with the care you need, when you need it.   You may see any of the following providers on your designated Care Team at your next follow up: Dr Arvilla Meres Dr Marca Ancona Dr. Marcos Eke, NP Robbie Lis, [place] [facility] [place], [place] Brynda Peon, NP Karle Plumber, PharmD   Please be sure to bring in all your medications bottles to every appointment.    Thank you for choosing [facility]

## 2022-07-24 NOTE — Telephone Encounter (Signed)
Advanced Heart Failure Patient Advocate Encounter  The patient was approved for a [employer] grant that will help cover the cost of Entresto, Farxiga, Spironolactone.  Total amount awarded, $10,000.  Effective: 06/24/2022 - 06/23/2023.  BIN [id number] PCN [id number] [id number] ID [id number]  Processing information provided directly to [facility]. Confirmed $0 copay for Sherryll Burger is ready for pickup. Informed patient by voicemail.Marland Kitchen  Burnell Blanks, CPhT Rx Patient Advocate Phone: [phone]

## 2022-07-26 ENCOUNTER — Encounter (HOSPITAL_COMMUNITY)
Admission: RE | Admit: 2022-07-26 | Discharge: 2022-07-26 | Disposition: A | Discharge status: Home / Self Care | Payer: Medicare Other | Source: Ambulatory Visit | Attending: Internal Medicine | Admitting: Internal Medicine

## 2022-07-26 ENCOUNTER — Encounter (HOSPITAL_COMMUNITY): Payer: Medicare Other

## 2022-07-26 VITALS — BP 98/68 | HR 78 | Ht 67.75 in | Wt 188.1 lb

## 2022-07-26 DIAGNOSIS — Z951 Presence of aortocoronary bypass graft: Secondary | ICD-10-CM | POA: Diagnosis not present

## 2022-07-26 DIAGNOSIS — I213 ST elevation (STEMI) myocardial infarction of unspecified site: Secondary | ICD-10-CM

## 2022-07-30 NOTE — Pre-Procedure Instructions (Signed)
Instructed patient on the following items: Arrival time 1100 Nothing to eat or drink after midnight No meds AM of procedure Responsible person to drive you home and stay with you for 24 hrs Wash with special soap night before and morning of procedure If on anti-coagulant drug instructions Eliquis- last dose this am, none tonight or in the morning.

## 2022-07-31 ENCOUNTER — Encounter (HOSPITAL_COMMUNITY)
Admission: RE | Disposition: A | Discharge status: Home / Self Care | Payer: Self-pay | Source: Home / Self Care | Attending: Internal Medicine

## 2022-07-31 ENCOUNTER — Encounter (HOSPITAL_COMMUNITY): Payer: Medicare Other

## 2022-07-31 ENCOUNTER — Ambulatory Visit (HOSPITAL_COMMUNITY): Payer: Medicare Other

## 2022-07-31 ENCOUNTER — Ambulatory Visit (HOSPITAL_COMMUNITY)
Admission: RE | Admit: 2022-07-31 | Discharge: 2022-07-31 | Disposition: A | Discharge status: Home / Self Care | Payer: Medicare Other | Attending: Internal Medicine | Admitting: Internal Medicine

## 2022-07-31 ENCOUNTER — Other Ambulatory Visit: Payer: Self-pay

## 2022-07-31 ENCOUNTER — Other Ambulatory Visit: Payer: Medicare Other

## 2022-07-31 DIAGNOSIS — I2729 Other secondary pulmonary hypertension: Secondary | ICD-10-CM | POA: Insufficient documentation

## 2022-07-31 DIAGNOSIS — I2699 Other pulmonary embolism without acute cor pulmonale: Secondary | ICD-10-CM | POA: Insufficient documentation

## 2022-07-31 DIAGNOSIS — I252 Old myocardial infarction: Secondary | ICD-10-CM | POA: Insufficient documentation

## 2022-07-31 DIAGNOSIS — I255 Ischemic cardiomyopathy: Secondary | ICD-10-CM

## 2022-07-31 DIAGNOSIS — Z8616 Personal history of COVID-19: Secondary | ICD-10-CM | POA: Diagnosis not present

## 2022-07-31 DIAGNOSIS — I509 Heart failure, unspecified: Secondary | ICD-10-CM | POA: Diagnosis not present

## 2022-07-31 DIAGNOSIS — Z8249 Family history of ischemic heart disease and other diseases of the circulatory system: Secondary | ICD-10-CM | POA: Diagnosis not present

## 2022-07-31 DIAGNOSIS — N289 Disorder of kidney and ureter, unspecified: Secondary | ICD-10-CM | POA: Insufficient documentation

## 2022-07-31 DIAGNOSIS — Z86711 Personal history of pulmonary embolism: Secondary | ICD-10-CM | POA: Diagnosis not present

## 2022-07-31 DIAGNOSIS — I2510 Atherosclerotic heart disease of native coronary artery without angina pectoris: Secondary | ICD-10-CM | POA: Diagnosis not present

## 2022-07-31 DIAGNOSIS — Z7901 Long term (current) use of anticoagulants: Secondary | ICD-10-CM | POA: Insufficient documentation

## 2022-07-31 DIAGNOSIS — Z951 Presence of aortocoronary bypass graft: Secondary | ICD-10-CM | POA: Diagnosis not present

## 2022-07-31 HISTORY — PX: ICD IMPLANT: EP1208

## 2022-07-31 SURGERY — ICD IMPLANT

## 2022-07-31 MED ORDER — LIDOCAINE HCL (PF) 1 % IJ SOLN
INTRAMUSCULAR | Status: DC | PRN
  Administered 2022-07-31: 60 mL

## 2022-07-31 MED ORDER — LIDOCAINE HCL 1 % IJ SOLN
INTRAMUSCULAR | Status: AC
  Filled 2022-07-31: qty 60

## 2022-07-31 MED ORDER — MIDAZOLAM HCL 5 MG/5ML IJ SOLN
INTRAMUSCULAR | Status: AC
  Filled 2022-07-31: qty 5

## 2022-07-31 MED ORDER — FENTANYL CITRATE (PF) 100 MCG/2ML IJ SOLN
INTRAMUSCULAR | Status: AC
  Filled 2022-07-31: qty 2

## 2022-07-31 MED ORDER — CEFAZOLIN SODIUM-DEXTROSE 2-4 GM/100ML-% IV SOLN
INTRAVENOUS | Status: AC
  Filled 2022-07-31: qty 100

## 2022-07-31 MED ORDER — SODIUM CHLORIDE 0.9 % IV SOLN: INTRAVENOUS | Status: DC

## 2022-07-31 MED ORDER — SODIUM CHLORIDE 0.9 % IV SOLN
INTRAVENOUS | Status: AC
  Filled 2022-07-31: qty 2

## 2022-07-31 MED ORDER — POVIDONE-IODINE 10 % EX SWAB
2.0000 | Freq: Once | CUTANEOUS | Status: AC
  Administered 2022-07-31: 2 via TOPICAL

## 2022-07-31 MED ORDER — MIDAZOLAM HCL 5 MG/5ML IJ SOLN
INTRAMUSCULAR | Status: DC | PRN
  Administered 2022-07-31: 1 mg via INTRAVENOUS
  Administered 2022-07-31: 2 mg via INTRAVENOUS

## 2022-07-31 MED ORDER — HEPARIN (PORCINE) IN NACL 1000-0.9 UT/500ML-% IV SOLN
INTRAVENOUS | Status: DC | PRN
  Administered 2022-07-31: 500 mL

## 2022-07-31 MED ORDER — FENTANYL CITRATE (PF) 100 MCG/2ML IJ SOLN
INTRAMUSCULAR | Status: DC | PRN
  Administered 2022-07-31: 25 ug via INTRAVENOUS
  Administered 2022-07-31: 50 ug via INTRAVENOUS

## 2022-07-31 MED ORDER — ACETAMINOPHEN 325 MG PO TABS
325.0000 mg | ORAL_TABLET | ORAL | Status: DC | PRN
  Administered 2022-07-31: 650 mg via ORAL
  Filled 2022-07-31: qty 2

## 2022-07-31 MED ORDER — CEFAZOLIN SODIUM-DEXTROSE 2-4 GM/100ML-% IV SOLN
2.0000 g | INTRAVENOUS | Status: AC
  Administered 2022-07-31: 2 g via INTRAVENOUS

## 2022-07-31 MED ORDER — SODIUM CHLORIDE 0.9 % IV SOLN
80.0000 mg | INTRAVENOUS | Status: AC
  Administered 2022-07-31: 80 mg

## 2022-07-31 MED ORDER — CHLORHEXIDINE GLUCONATE 4 % EX SOLN
4.0000 | Freq: Once | CUTANEOUS | Status: DC
  Filled 2022-07-31: qty 60

## 2022-07-31 SURGICAL SUPPLY — 6 items
CABLE SURGICAL S-101-97-12 (CABLE) ×2 IMPLANT
ICD ELLIPSE VR CD1411-36C (ICD Generator) IMPLANT
LEAD DURATA 7122-60CM (Lead) IMPLANT
PAD DEFIB RADIO PHYSIO CONN (PAD) ×2 IMPLANT
SHEATH 7FR PRELUDE SNAP 13 (SHEATH) IMPLANT
TRAY PACEMAKER INSERTION (PACKS) ×2 IMPLANT

## 2022-07-31 NOTE — Discharge Instructions (Addendum)
After Your ICD (Implantable Cardiac Defibrillator)   You have a [employer] ICD  ACTIVITY Do not lift your arm above shoulder height for 1 week after your procedure. After 7 days, you may progress as below.  You should remove your sling 24 hours after your procedure, unless otherwise instructed by your provider.     Wednesday August 07, 2022  Thursday August 08, 2022 Friday August 09, 2022 Saturday August 10, 2022   Do not lift, push, pull, or carry anything over 10 pounds with the affected arm until 6 weeks (Wednesday September 11, 2022 ) after your procedure.   You may drive AFTER your wound check, unless you have been told otherwise by your provider.   Ask your healthcare provider when you can go back to work   INCISION/Dressing If you are on a blood thinner such as  Eliquis, please confirm with your provider when this should be resumed. FRIDAY 5/31 EVENING DOSE ONLY  The large, outer pressure dressing is left in place, this can be removed on Monday 6/3, if any swelling call Dr Odessa Fleming office.   Monitor your defibrillator site for redness, swelling, and drainage. Call the device clinic at [phone] if you experience these symptoms or fever/chills.  If your incision is closed with Dermabond/Surgical glue. You may shower 1 day after your pacemaker implant and wash around the site with soap and water.    If you were discharged in a sling, please do not wear this during the day more than 48 hours after your surgery unless otherwise instructed. This may increase the risk of stiffness and soreness in your shoulder.   Avoid lotions, ointments, or perfumes over your incision until it is well-healed.  You may use a hot tub or a pool AFTER your wound check appointment if the incision is completely closed.  Your ICD is designed to protect you from life threatening heart rhythms. Because of this, you may receive a shock.   1 shock with no symptoms:  Call the office during business hours. 1 shock with  symptoms (chest pain, chest pressure, dizziness, lightheadedness, shortness of breath, overall feeling unwell):  Call 911. If you experience 2 or more shocks in 24 hours:  Call 911. If you receive a shock, you should not drive for 6 months per the [place] DMV IF you receive appropriate therapy from your ICD.   ICD Alerts:  Some alerts are vibratory and others beep. These are NOT emergencies. Please call our office to let us know. If this occurs at night or on weekends, it can wait until the next business day. Send a remote transmission.  If your device is capable of reading fluid status (for heart failure), you will be offered monthly monitoring to review this with you.   DEVICE MANAGEMENT Remote monitoring is used to monitor your ICD from home. This monitoring is scheduled every 91 days by our office. It allows [place] to keep an eye on the functioning of your device to ensure it is working properly. You will routinely see your Electrophysiologist annually (more often if necessary).   You should receive your ID card for your new device in 4-8 weeks. Keep this card with you at all times once received. Consider wearing a medical alert bracelet or necklace.  Your ICD  may be MRI compatible. This will be discussed at your next office visit/wound check.  You should avoid contact with strong electric or magnetic fields.   Do not use amateur (ham) radio equipment or electric (  arc) welding torches. MP3 player headphones with magnets should not be used. Some devices are safe to use if held at least 12 inches (30 cm) from your defibrillator. These include power tools, lawn mowers, and speakers. If you are unsure if something is safe to use, ask your health care provider.  When using your cell phone, hold it to the ear that is on the opposite side from the defibrillator. Do not leave your cell phone in a pocket over the defibrillator.  You may safely use electric blankets, heating pads, computers, and microwave  ovens.  Call the office right away if: You have chest pain. You feel more than one shock. You feel more short of breath than you have felt before. You feel more light-headed than you have felt before. Your incision starts to open up.  This information is not intended to replace advice given to you by your health care provider. Make sure you discuss any questions you have with your health care provider.

## 2022-07-31 NOTE — H&P (Signed)
Patient Care Team: Jackelyn Poling, DO as PCP - General (Family Medicine) Orbie Pyo, MD as PCP - Cardiology (Cardiology) Laurey Morale, MD as PCP - Advanced Heart Failure (Cardiology)   HPI  Juan Snow is a 67 y.o. male admitted for ICD implantation for primary prevention for CAD with depressed LV function   Presented 1/24 with a anterolateral MI incidentally found to have COVID.  Revascularization was not possible and he underwent bypass surgery with balloon pump support.   History of a submassive PE.  Which he understands is to be continued indefinitely.   Following discharge from hospital after his surgery he has continued to improve.   Functional status is stable, no resting dyspnea, no edema no fevers chills    DATE TEST EF    3/23 Echo  50-55%    1/24 Echo   20-25 %    4/24 Echo  <20%               Date Cr K Hgb  4/24 1.53 4.5 13.1                Records and Results Reviewed   Past Medical History:  Diagnosis Date   Arthritis    OA   Cough    WHITE SPUTUM OCC, NO FEVER COLD SYMPTOMS MAINLY   GERD (gastroesophageal reflux disease)    Other secondary pulmonary hypertension (HCC) 07/07/2022   TTE 06/10/2022: EF <20, no RWMA, normal RVSF, moderately elevated PASP, mild MR, moderate TR, RAP 3, RVSP 52.6    Past Surgical History:  Procedure Laterality Date   CHOLECYSTECTOMY N/A 03/30/2021   Procedure: LAPAROSCOPIC CHOLECYSTECTOMY WITH INTRAOPERATIVE CHOLANGIOGRAM;  Surgeon: Darnell Level, MD;  Location: [facility];  Service: General;  Laterality: N/A;   COLONOSCOPY  06/17/2011   Procedure: COLONOSCOPY;  Surgeon: Charna Elizabeth, MD;  Location: [facility];  Service: Endoscopy;  Laterality: N/A;   CORONARY ARTERY BYPASS GRAFT N/A 03/12/2022   Procedure: CORONARY ARTERY BYPASS GRAFTING (CABG) X TWO BYPASSES USING LEFT INTERNAL MAMMARY ARTERY AND RIGHT GREATER SAPHENOUS  VEIN HARVEST.;  Surgeon: Corliss Skains, MD;  Location: [facility] OR;  Service: Open Heart  Surgery;  Laterality: N/A;   CORONARY/GRAFT ACUTE MI REVASCULARIZATION N/A 03/10/2022   Procedure: Coronary/Graft Acute MI Revascularization;  Surgeon: Orbie Pyo, MD;  Location: [facility];  Service: Cardiovascular;  Laterality: N/A;   IABP INSERTION N/A 03/10/2022   Procedure: IABP Insertion;  Surgeon: Orbie Pyo, MD;  Location: [facility];  Service: Cardiovascular;  Laterality: N/A;   IR ANGIOGRAM PULMONARY BILATERAL SELECTIVE  05/11/2021   IR ANGIOGRAM SELECTIVE EACH ADDITIONAL VESSEL  05/11/2021   IR ANGIOGRAM SELECTIVE EACH ADDITIONAL VESSEL  05/11/2021   IR RADIOLOGIST EVAL & MGMT  06/06/2021   IR RADIOLOGIST EVAL & MGMT  09/06/2021   IR RADIOLOGIST EVAL & MGMT  12/26/2021   IR THROMBECT VENO MECH MOD SED  05/11/2021   IR US GUIDE VASC ACCESS RIGHT  05/11/2021   KNEE SURGERY Right YRS AGO   MENISCUS   LEFT HEART CATH AND CORONARY ANGIOGRAPHY N/A 03/10/2022   Procedure: LEFT HEART CATH AND CORONARY ANGIOGRAPHY;  Surgeon: Orbie Pyo, MD;  Location: [facility];  Service: Cardiovascular;  Laterality: N/A;   LEFT HEART CATH AND CORONARY ANGIOGRAPHY N/A 03/10/2022   Procedure: LEFT HEART CATH AND CORONARY ANGIOGRAPHY;  Surgeon: Orbie Pyo, MD;  Location: [facility];  Service: Cardiovascular;  Laterality: N/A;   TEE WITHOUT CARDIOVERSION N/A 03/12/2022   Procedure: TRANSESOPHAGEAL ECHOCARDIOGRAM (TEE);  Surgeon: Corliss Skains, MD;  Location: [facility];  Service: Open Heart Surgery;  Laterality: N/A;   THUMB ARTHROSCOPY Right YRS AGO   TOTAL HIP ARTHROPLASTY Left 06/25/2017   Procedure: LEFT TOTAL HIP ARTHROPLASTY ANTERIOR APPROACH;  Surgeon: Ollen Gross, MD;  Location: [facility];  Service: Orthopedics;  Laterality: Left;    Current Facility-Administered Medications  Medication Dose Route Frequency Provider Last Rate Last Admin   0.9 %  sodium chloride infusion   Intravenous Continuous Duke Salvia, MD 50 mL/hr at 07/31/22 1130 New Bag at 07/31/22  1130   0.9 %  sodium chloride infusion   Intravenous Continuous Duke Salvia, MD 50 mL/hr at 07/31/22 1125 New Bag at 07/31/22 1125   ceFAZolin (ANCEF) 2-4 GM/100ML-% IVPB            ceFAZolin (ANCEF) IVPB 2g/100 mL premix  2 g Intravenous On Call Duke Salvia, MD       chlorhexidine (HIBICLENS) 4 % liquid 4 Application  4 Application Topical Once Duke Salvia, MD       gentamicin (GARAMYCIN) 80 mg in sodium chloride 0.9 % 500 mL irrigation  80 mg Irrigation On Call Duke Salvia, MD       sodium chloride 0.9 % with gentamicin (GARAMYCIN) ADS Med             No Known Allergies    Social History   Tobacco Use   Smoking status: Never   Smokeless tobacco: Never  Vaping Use   Vaping Use: Never used  Substance Use Topics   Alcohol use: No   Drug use: No     Family History  Problem Relation Age of Onset   CAD Brother    Hypertension Other    Diabetes Neg Hx      Current Meds  Medication Sig   apixaban (ELIQUIS) 5 MG TABS tablet Take 1 tablet (5 mg total) by mouth 2 (two) times daily.   Ascorbic Acid (VITAMIN C PO) Take 1 tablet by mouth daily.   aspirin EC 81 MG tablet Take 1 tablet (81 mg total) by mouth daily. Swallow whole.   atorvastatin (LIPITOR) 80 MG tablet Take 80 mg by mouth daily.   Cholecalciferol (VITAMIN D-3 PO) Take 1 tablet by mouth daily.   Coenzyme Q10 (CO Q-10 PO) Take 1 capsule by mouth daily.   famotidine-calcium carbonate-magnesium hydroxide (PEPCID COMPLETE) 10-800-165 MG chewable tablet Chew 1 tablet by mouth 2 (two) times daily as needed (acid reflux).   FARXIGA 10 MG TABS tablet TAKE 1 TABLET(10 MG) BY MOUTH DAILY   Menaquinone-7 (VITAMIN K2 PO) Take 1 tablet by mouth daily.   metoprolol succinate (TOPROL XL) 25 MG 24 hr tablet Take 1/2 tablet (12.5 mg total) by mouth daily.   sacubitril-valsartan (ENTRESTO) 24-26 MG Take 1 tablet by mouth 2 (two) times daily.   sildenafil (REVATIO) 20 MG tablet Take 60-100 mg by mouth as needed (erectile  dysfunction).   spironolactone (ALDACTONE) 25 MG tablet TAKE 1 TABLET(25 MG) BY MOUTH DAILY     Review of Systems negative except from HPI and PMH  Physical Exam BP 104/77   Pulse 73   Temp (!) 97.5 F (36.4 C) (Temporal)   Resp 17   Ht 5' 7.5" (1.715 m)   Wt 83.9 kg   SpO2 98%   BMI 28.55 kg/m  Well developed and well nourished  in no acute distress HENT normal E scleral and icterus clear Neck Supple JVP flat; carotids brisk and full Clear to ausculation Regular rate and rhythm, no murmurs gallops or rub Soft with active bowel sounds No clubbing cyanosis  Edema Alert and oriented, grossly normal motor and sensory function Skin Warm and Dry    Assessment and  Plan  Ischemic cardiomyopathy   CAD s/p CABG   Class Ib/IIa heart failure  Pulmonary embolism    Renal insufficiency  Estimated Creatinine Clearance: 49.5 mL/min (A) (by C-G formula based on SCr of 1.53 mg/dL (H)).   Admitted for ICD implant primary prevention in settng ischemic cardiomyopathy  Will resume Apixaban  post op given PE but will hold off for 48 hrs to try and minimize bleeding risk and will use pressure dressing

## 2022-08-01 ENCOUNTER — Telehealth: Payer: Self-pay

## 2022-08-01 ENCOUNTER — Encounter (HOSPITAL_COMMUNITY): Payer: Self-pay | Admitting: Internal Medicine

## 2022-08-01 NOTE — Progress Notes (Signed)
Discharge Progress Report  Patient Details  Name: Juan Snow MRN: [id number] Date of Birth: 05-Dec-1955 Referring Provider:   Flowsheet Row INTENSIVE CARDIAC REHAB ORIENT from 05/28/2022 in [facility]  Referring Provider Orbie Pyo, MD        Number of Visits: 39  Reason for Discharge:  Patient reached a stable level of exercise. Patient independent in their exercise. Patient has met program and personal goals.  Smoking History:  Social History   Tobacco Use  Smoking Status Never  Smokeless Tobacco Never    Diagnosis:  03/10/22 STEMI  03/12/22 CABG x 2  ADL UCSD:   Initial Exercise Prescription:  Initial Exercise Prescription - 05/28/22 0900       Date of Initial Exercise RX and Referring Provider   Date 05/28/22    Referring Provider Orbie Pyo, MD    Expected Discharge Date 08/09/22      Treadmill   MPH 2    Grade 3    Minutes 15    METs 3.36      NuStep   Level 4    SPM 85    Minutes 15    METs 3.2      Prescription Details   Frequency (times per week) 3    Duration Progress to 30 minutes of continuous aerobic without signs/symptoms of physical distress      Intensity   THRR 40-80% of Max Heartrate 62-123    Ratings of Perceived Exertion 11-13    Perceived Dyspnea 0-4      Progression   Progression Continue to progress workloads to maintain intensity without signs/symptoms of physical distress.      Resistance Training   Training Prescription Yes    Weight 8 lbs    Reps 10-15             Discharge Exercise Prescription (Final Exercise Prescription Changes):  Exercise Prescription Changes - 07/26/22 1015       Response to Exercise   Blood Pressure (Admit) 98/68    Blood Pressure (Exercise) 118/70    Blood Pressure (Exit) 108/62    Heart Rate (Admit) 78 bpm    Heart Rate (Exercise) 105 bpm    Heart Rate (Exit) 77 bpm    Rating of Perceived Exertion  (Exercise) 11    Symptoms None    Comments Patient completed the cardiac rehab program today.    Duration Continue with 30 min of aerobic exercise without signs/symptoms of physical distress.    Intensity THRR unchanged      Progression   Progression Continue to progress workloads to maintain intensity without signs/symptoms of physical distress.    Average METs 3.3      Resistance Training   Training Prescription Yes    Weight 6 lbs    Reps 10-15    Time 10 Minutes      Interval Training   Interval Training No      Treadmill   MPH 2.4    Grade 3    Minutes 15    METs 3.83      NuStep   Level 5    SPM 95    Minutes 15    METs 2.8      Home Exercise Plan   Plans to continue exercise at Home (comment)   Walking, treadmill at home, plans to return to gym.   Frequency Add 2 additional days to program exercise sessions.  Initial Home Exercises Provided 06/28/22             Functional Capacity:  6 Minute Walk     Row Name 05/28/22 0838 07/17/22 1032       6 Minute Walk   Phase Initial Discharge    Distance 1416 feet 1631 feet    Distance % Change -- 15.18 %    Distance Feet Change -- 215 ft    Walk Time 6 minutes 6 minutes    # of Rest Breaks 0 0    MPH 2.68 3.09    METS 3.23 3.57    RPE 11 7    Perceived Dyspnea  1 1    VO2 Peak 11.32 12.5    Symptoms Yes (comment) Yes (comment)    Comments Mild shortness of breath. Mild shortness of breath.    Resting HR 70 bpm 78 bpm    Resting BP 98/72 98/60    Resting Oxygen Saturation  98 % --    Exercise Oxygen Saturation  during 6 min walk 97 % 96 %    Max Ex. HR 103 bpm 108 bpm    Max Ex. BP 120/88 112/82    2 Minute Post BP 122/84 94/60             Psychological, QOL, Others - Outcomes: PHQ 2/9:    07/26/2022    1:23 PM 07/19/2022    1:23 PM 05/28/2022    9:46 AM  Depression screen PHQ 2/9  Decreased Interest 0 0 0  Down, Depressed, Hopeless 0 0 0  PHQ - 2 Score 0 0 0  Altered sleeping 0 1 0   Tired, decreased energy 0 0 0  Change in appetite 0 0 0  Feeling bad or failure about yourself  0 0 0  Trouble concentrating 0 0 0  Moving slowly or fidgety/restless 0 0 0  Suicidal thoughts 0 0 0  PHQ-9 Score 0 1 0  Difficult doing work/chores  Not difficult at all     Quality of Life:  Quality of Life - 07/26/22 1323       Quality of Life   Select Quality of Life      Quality of Life Scores   Health/Function Pre 24.8 %    Health/Function Post 25.6 %    Health/Function % Change 3.23 %    Socioeconomic Pre 25.8 %    Socioeconomic Post 27.43 %    Socioeconomic % Change  6.32 %    Psych/Spiritual Pre 24.86 %    Psych/Spiritual Post 30 %    Psych/Spiritual % Change 20.68 %    Family Pre 28.8 %    Family Post 27.6 %    Family % Change -4.17 %    GLOBAL Pre 25.59 %    GLOBAL Post 27.18 %    GLOBAL % Change 6.21 %             Personal Goals: Goals established at orientation with interventions provided to work toward goal.  Personal Goals and Risk Factors at Admission - 05/28/22 0951       Core Components/Risk Factors/Patient Goals on Admission   Lipids Yes    Intervention Provide education and support for participant on nutrition & aerobic/resistive exercise along with prescribed medications to achieve LDL 70mg , HDL >40mg .    Expected Outcomes Short Term: Participant states understanding of desired cholesterol values and is compliant with medications prescribed. Participant is following exercise prescription and nutrition guidelines.;Long Term: Cholesterol  controlled with medications as prescribed, with individualized exercise RX and with personalized nutrition plan. Value goals: LDL < 70mg , HDL > 40 mg.              Personal Goals Discharge:  Goals and Risk Factor Review     Row Name 06/03/22 1659 06/13/22 1249 07/09/22 1403         Core Components/Risk Factors/Patient Goals Review   Personal Goals Review Lipids Lipids;Heart Failure Lipids;Heart Failure      Review Juan Snow started intensive cardiac rehab on 06/03/22. Juan Snow did well with exercise, vital signs were stable Juan Snow started intensive cardiac rehab on 06/03/22. Juan Snow is off to a good start to  exercise, vital signs have been stable. Juan Snow continues to do well with exercise at   intensive cardiac rehab on 06/03/22. Juan Snow's , vital signs remain stable. Juan Snow will complete intensive cardiac rehab opn 06/23/21 due to his scheduled ICD implant.     Expected Outcomes Juan Snow will continue to participate in intensive cardiac rehab for exercise, nutrition and lifestyle modificaitons Juan Snow will continue to participate in intensive cardiac rehab for exercise, nutrition and lifestyle modificaitons Juan Snow will continue to participate in intensive cardiac rehab for exercise, nutrition and lifestyle modificaitons              Exercise Goals and Review:  Exercise Goals     Row Name 05/28/22 0807             Exercise Goals   Increase Physical Activity Yes       Intervention Provide advice, education, support and counseling about physical activity/exercise needs.;Develop an individualized exercise prescription for aerobic and resistive training based on initial evaluation findings, risk stratification, comorbidities and participant's personal goals.       Expected Outcomes Short Term: Attend rehab on a regular basis to increase amount of physical activity.;Long Term: Add in home exercise to make exercise part of routine and to increase amount of physical activity.;Long Term: Exercising regularly at least 3-5 days a week.       Increase Strength and Stamina Yes       Intervention Provide advice, education, support and counseling about physical activity/exercise needs.;Develop an individualized exercise prescription for aerobic and resistive training based on initial evaluation findings, risk stratification, comorbidities and participant's personal goals.       Expected Outcomes Short Term:  Increase workloads from initial exercise prescription for resistance, speed, and METs.;Short Term: Perform resistance training exercises routinely during rehab and add in resistance training at home;Long Term: Improve cardiorespiratory fitness, muscular endurance and strength as measured by increased METs and functional capacity ( )       Able to understand and use rate of perceived exertion (RPE) scale Yes       Intervention Provide education and explanation on how to use RPE scale       Expected Outcomes Short Term: Able to use RPE daily in rehab to express subjective intensity level;Long Term:  Able to use RPE to guide intensity level when exercising independently       Knowledge and understanding of Target Heart Rate Range (THRR) Yes       Intervention Provide education and explanation of THRR including how the numbers were predicted and where they are located for reference       Expected Outcomes Short Term: Able to state/look up THRR;Long Term: Able to use THRR to govern intensity when exercising independently;Short Term: Able to use daily as guideline for intensity in rehab  Able to check pulse independently Yes       Intervention Provide education and demonstration on how to check pulse in carotid and radial arteries.;Review the importance of being able to check your own pulse for safety during independent exercise       Expected Outcomes Short Term: Able to explain why pulse checking is important during independent exercise;Long Term: Able to check pulse independently and accurately       Understanding of Exercise Prescription Yes       Intervention Provide education, explanation, and written materials on patient's individual exercise prescription       Expected Outcomes Short Term: Able to explain program exercise prescription;Long Term: Able to explain home exercise prescription to exercise independently                Exercise Goals Re-Evaluation:  Exercise Goals  Re-Evaluation     Row Name 06/03/22 1130 06/17/22 1103 06/28/22 1040 07/01/22 1059 07/15/22 1025     Exercise Goal Re-Evaluation   Exercise Goals Review Increase Physical Activity;Able to understand and use rate of perceived exertion (RPE) scale;Increase Strength and Stamina Increase Physical Activity;Able to understand and use rate of perceived exertion (RPE) scale;Increase Strength and Stamina;Knowledge and understanding of Target Heart Rate Range (THRR);Able to check pulse independently Increase Physical Activity;Able to understand and use rate of perceived exertion (RPE) scale;Increase Strength and Stamina;Knowledge and understanding of Target Heart Rate Range (THRR);Able to check pulse independently;Understanding of Exercise Prescription Increase Physical Activity;Able to understand and use rate of perceived exertion (RPE) scale;Increase Strength and Stamina;Knowledge and understanding of Target Heart Rate Range (THRR);Able to check pulse independently;Understanding of Exercise Prescription Increase Physical Activity;Able to understand and use rate of perceived exertion (RPE) scale;Increase Strength and Stamina;Knowledge and understanding of Target Heart Rate Range (THRR);Able to check pulse independently;Understanding of Exercise Prescription   Comments Patient able to understand and use RPE scale approriately. Patient is making good progress with exercise. Patient is walking outside or on his treadmill at home 15 minutes daily and using his 8 lb shand weights. Patient's goal is to get back to exercise at the gym at the levels he was previously exercising at. Reviewed exercise prescription with patient. Patient is walking 15-20 minutes on his TM at home. We discussed increasing duration to 30 minutes, and he's amenable to this. Patient plans to return to the gym this weekend. Patient mentioned he's been having trouble sleeping and will follow-up with his cardiologist regarding this issue. Patient rode  the bike 15 minutes, and used the weight machines at the gym this weekend and tolerated well without anxiety. Patient walked an additional 15 minutes on his TM after cardiac rehab last Wed, mosed his grass, and did some weed eating and tolerated well. Pt was a little tired the next day but was pleased he was able to do the activities.   Expected Outcomes Progress workloads as tolerated to help increase strength and stamina. Continue to progress workloads as tolerated to achieve previous exercise workloads. Patient will increase walking duration at home to 30 minutes and will resume exercise at local gym. Patient will continue exercise at the gym in addition to exercise at cardiac rehab. Continue current exercise routine to maintain progress.    Row Name 07/26/22 1050             Exercise Goal Re-Evaluation   Exercise Goals Review Increase Physical Activity;Able to understand and use rate of perceived exertion (RPE) scale;Increase Strength and Stamina;Knowledge and understanding of Target Heart  Rate Range (THRR);Able to check pulse independently;Understanding of Exercise Prescription       Comments Patient completed the cardiac rehab program today and progressed well, achieving 3.3 METs with exercise. Patient's functional capacity increased 15% as measured by , strength increased 20% as measured by grip strength test. Patient is having an ICD placed next week and will resume exercise walking on his TM at home and exercise at the gym once cleared by his physician.       Expected Outcomes Patient will continue exercise at least 30 minutes, 5-6 days/week to maintain health and fitness gains.                Nutrition & Weight - Outcomes:  Pre Biometrics - 05/28/22 0740       Pre Biometrics   Waist Circumference 39.5 inches    Hip Circumference 39.75 inches    Waist to Hip Ratio 0.99 %    Triceps Skinfold 7 mm    % Body Fat 24.4 %    Grip Strength 40 kg    Flexibility 12.75 in    Single  Leg Stand 14.79 seconds             Post Biometrics - 07/26/22 1032        Post  Biometrics   Height 5' 7.75" (1.721 m)    Waist Circumference 39 inches    Hip Circumference 40 inches    Waist to Hip Ratio 0.98 %    Triceps Skinfold 7 mm    % Body Fat 24.4 %    Grip Strength 48 kg    Flexibility 14.5 in    Single Leg Stand 21.43 seconds             Nutrition:  Nutrition Therapy & Goals - 07/03/22 1105       Nutrition Therapy   Diet Heart Healthy Diet    Drug/Food Interactions Statins/Certain Fruits      Personal Nutrition Goals   Nutrition Goal Patient to identify strategies for reducing cardiovascular risk by attending the [employer] education and nutrition series weekly.    Personal Goal #2 Patient to improve diet quality by using the plate method as a guide for meal planning to include lean protein/plant protein, fruits, vegetables, whole grains, nonfat dairy as part of a well-balanced diet.    Personal Goal #3 Patient to identify food sources and limit daily intake of saturated fat, trans fat, sodium, and refined carbohydrates.    Personal Goal #4 Patient to reduce sodium to 1500mg  per day.    Comments Goals in action. Juan Snow continues to attend the [employer] education and nutrition series regularly. He reports improved understanding of reading food labels for sodium and increased fiber intake. He reports he has reduced sodium to 2000mg  per day and is now working closer to 1500mg  per day. His wife remains very supportive of lifestyle changes. Juan Snow will benefit from participation in intensive cardiac rehab for nutrition, exercise, and lifestyle modification.      Intervention Plan   Intervention Nutrition handout(s) given to patient.;Prescribe, educate and counsel regarding individualized specific dietary modifications aiming towards targeted core components such as weight, hypertension, lipid management, diabetes, heart failure and other comorbidities.     Expected Outcomes Short Term Goal: Understand basic principles of dietary content, such as calories, fat, sodium, cholesterol and nutrients.;Long Term Goal: Adherence to prescribed nutrition plan.             Nutrition Discharge:  Nutrition Assessments -  07/30/22 1157       Rate Your Plate Scores   Post Score 74             Education Questionnaire Score:  Knowledge Questionnaire Score - 07/26/22 1323       Knowledge Questionnaire Score   Pre Score 19/24    Post Score 20/24             Goals reviewed with patient; copy given to patient. Juan Snow  graduates from  Intensive cardiac rehab program on 07/26/22  with completion of 22  exercise and  17 education sessions. Pt maintained good attendance and progressed nicely during their participation in rehab as evidenced by increased MET level. Juan Snow increased his distance on his post exercise walk test by 215 feet.   Medication list reconciled. Repeat  PHQ score- 0 .  Pt has made significant lifestyle changes and should be commended for his success. Juan Snow   achieved their goals during cardiac rehab.   Pt plans to continue exercise on his treadmill at home and at the gym once cleared by EP post ICD implantation. Juan Snow hopes to be able to participate in a golf tournament at the end of the summer. We are proud of Juan Snow's progress progress!Juan Headings RN BSN

## 2022-08-01 NOTE — Telephone Encounter (Signed)
Abbott ICD  Same day discharge  SK  Resume Eliquis Friday 5/31 evening dose

## 2022-08-02 ENCOUNTER — Encounter (HOSPITAL_COMMUNITY): Payer: Medicare Other

## 2022-08-02 NOTE — Progress Notes (Signed)
Advanced Heart Failure Clinic Note   Primary Care: Jackelyn Poling, DO CVTS: Dr. Cliffton Asters HF Cardiologist: Dr. Shirlee Latch  HPI:  67 y.o. male with a history of arthritis, GERD and submassive PE 05/2021 treated with catheter directed thrombolysis, CKD stage IIIa, CAD s/p CABG x 2, and HFrEF.    Admitted 03/2022 w/ acute anterolateral STEMI. Incidentally found to be COVID +. Emergent cath showed high grade ostial LAD to proximal LAD lesion in the 80% stenosis range as well as a 80% ramus intermedius lesion. Ventriculography demonstrated an anterior wall motion abnormality with an LVEDP of 22 mmHg. Multiple viewing angles were employed to try to separate the ostium of the ramus from the ostium of the LAD. This could not be accomplished. Given that the patient had TIMI-3 flow in all vessels, percutaneous intervention was deferred. Felt best suited for surgical revascularization. IABP placed and referred for CABG. Pre-op echo showed severely reduced LVEF 25-30%, RV normal, trivial MR w/ AK of the mid and distal anterior wall, mid and distal lateral wall, mid and distal  anterior septum, entire apex, mid anterolateral segment, and mid inferoseptal walls (prior Echo 05/2021 in setting of submassive PE w/ mod reduced RV and LVEF 50-55%, repeat limited echo 08/2021 showed RV function returned to normal w/ LVEF up to 60-65%). He underwent CABG x 2 w/ LIMA-LAD and SVG-ramus. Intra-op, he developed acute worsening of LV and RV function as well as acute rise in PA pressure (PASP 80s-90s).  Difficult to wean off bypass, but eventually successful wean with iNO, inotropes, pressors, IABP.  Eventually able to wean pressors and remove IABP. Hospitalization c/b pseudomonas bacteremia, ID consulted and completed IV abx (unclear source). GDMT titrated but limited by AKI. He was discharged home, weight 183 lbs.   Echo 03/25/22 showed LVEF of 25-30% with grade II diastolic dysfunction. He worked for [employer] x 35 years in the OR.    Repeat echo in 06/2022 showed EF < 20%, mild LV dilation, mild RV dilation with normal RV systolic function, mild MR, normal IVC.   Abbott ICD implant 07/31/22  Patient returned to [facility] for follow up 07/24/22. Weight was up 4 lbs. He was doing cardiac rehab. Stamina was improving. He was short of breath walking up hills, does fine walking on flat ground and walking up a flight of stairs. No orthopnea/PND. No chest pain. No lightheadedness.   Today he returns to HF clinic for pharmacist medication titration. At last visit with MD, losartan was discontinued and Entresto 24/26 mg BID was initiated. Overall he is feeling well today. No dizziness or lightheadedness. Mild chest comfort after ICD implant, had wound check appointment yesterday and doing well. No SOB/DOE. He has been walking 20 minutes on the treadmill. Has graduated from CR. Weight at home has been stable, 182.5-184 lbs. He has not needed any PRN Lasix. No LEE, PND or orthopnea. Taking all medications as prescribed and tolerating all medications. BP at home this AM was 101/74, BP in clinic 102/70.    HF Medications: Metoprolol succinate 12.5 mg daily Entresto 24/26 mg BID Spironolactone 25 mg daily Dapagliflozin 10 mg daily Lasix 40 mg PRN  Has the patient been experiencing any side effects to the medications prescribed?  No  Does the patient have any problems obtaining medications due to transportation or finances?   No. [employer] Medicare part D plan. Has Orthoptist for [employer].   Understanding of regimen: excellent Understanding of indications: excellent Potential of compliance: excellent Patient understands  to avoid NSAIDs. Patient understands to avoid decongestants.    Pertinent Lab Values  08/05/22: Serum creatinine 1.54, BUN 20, Potassium 4.4, Sodium 138  Vital Signs:  Weight: 188.2 lbs (last clinic weight: 188 lbs) Blood pressure: 102/70 mmHg  Heart rate: 61   Assessment/Plan: 1. Chronic systolic  CHF:  Ischemic cardiomyopathy. Echo in 03/2022 with LV EF 25-30%, wall motion abnormalities, normal RV.  Echo in 06/2022 showed EF < 20%, mild LV dilation, mild RV dilation with normal RV systolic function, mild MR, normal IVC.  Abbott ICD placed 07/2022 -  NYHA class II symptoms, euvolemic on exam.  - Continue Lasix 40 mg PRN. He has not required any. - Increase metoprolol succinate to 25 mg daily. HR 61 today, but since tolerating well with no issues, believe he will tolerate small increase. - Continue Entresto 24/26 mg BID - Continue spironolactone 25 mg daily.  - Continue Farxiga 10 mg daily. 2. CAD: s/p anterolateral MI in 03/2022 with cath showing 80% ostial LAD stenosis and 80% ostial large ramus stenosis.  Ostial lesions in close proximity made PCI very difficult and CABG was elected.  Underwent LIMA-LAD and SVG-ramus on 03/12/22. No chest pain.  - Continue ASA 81 daily (stop 6 mos-1 year post-CABG since he will continue Eliquis).  - Stop atorvastatin 80 mg daily and start rosuvastatin 40 mg daily (prescribed previously but had been waiting to finish atorvastatin prescription before switching).  - Continue cardiac rehab.   3. H/o PE: Submassive PE in 05/2021, treated with catheter-directed thrombolysis.  - Continue Eliquis.  4. CKD Stage III: Scr stable at 1.54 on recent labs 08/05/22.   Follow up in 3 weeks with [facility].  Karle Plumber, PharmD, BCPS, BCCP, CPP Heart Failure Clinic Pharmacist [phone]

## 2022-08-02 NOTE — Telephone Encounter (Signed)
Follow-up after same day discharge: Implant date: 07/31/2022 MD: Sherryl Manges, MD Device: SJ ICD  Wound check visit: 08/14/2022 at 10:40 am 90 day MD follow-up: scheduled  Remote Transmission received:  SJ-requested Pt send a remote transmission-instructions given  Dressing/sling removed: Dermabond.  Pt reapplied sling.  Advised he did not need to wear that anymore.  Referred Pt to discharge instructions for ROM exercises.  Confirm OAC restart on: Eliquis on 08/02/2022 PM dose-Pt states he is aware and will restart tonight  All questions answered.

## 2022-08-05 ENCOUNTER — Encounter (HOSPITAL_COMMUNITY): Payer: Medicare Other

## 2022-08-05 ENCOUNTER — Ambulatory Visit (HOSPITAL_COMMUNITY)
Admission: RE | Admit: 2022-08-05 | Discharge: 2022-08-05 | Disposition: A | Discharge status: Home / Self Care | Payer: Medicare Other | Source: Ambulatory Visit | Attending: Cardiology | Admitting: Cardiology

## 2022-08-05 DIAGNOSIS — I5021 Acute systolic (congestive) heart failure: Secondary | ICD-10-CM

## 2022-08-05 DIAGNOSIS — I5020 Unspecified systolic (congestive) heart failure: Secondary | ICD-10-CM

## 2022-08-05 LAB — BASIC METABOLIC PANEL
Anion gap: 9 (ref 5–15)
BUN: 20 mg/dL (ref 8–23)
CO2: 23 mmol/L (ref 22–32)
Calcium: 9.4 mg/dL (ref 8.9–10.3)
Chloride: 106 mmol/L (ref 98–111)
Creatinine, Ser: 1.54 mg/dL — ABNORMAL HIGH (ref 0.61–1.24)
GFR, Estimated: 49 mL/min — ABNORMAL LOW (ref >60)
Glucose, Bld: 91 mg/dL (ref 70–99)
Potassium: 4.4 mmol/L (ref 3.5–5.1)
Sodium: 138 mmol/L (ref 135–145)

## 2022-08-05 NOTE — Addendum Note (Signed)
Encounter addended by: Duke Salvia, MD on: 08/05/2022 2:06 PM  Actions taken: Clinical Note Signed, Charge Capture section accepted

## 2022-08-05 NOTE — H&P (Signed)
Addendum  HFrEF

## 2022-08-06 ENCOUNTER — Other Ambulatory Visit (HOSPITAL_COMMUNITY): Payer: Self-pay

## 2022-08-07 ENCOUNTER — Encounter (HOSPITAL_COMMUNITY): Payer: Medicare Other

## 2022-08-09 ENCOUNTER — Encounter (HOSPITAL_COMMUNITY): Payer: Medicare Other

## 2022-08-13 NOTE — Patient Instructions (Signed)
   After Your ICD (Implantable Cardiac Defibrillator)    Monitor your defibrillator site for redness, swelling, and drainage. Call the device clinic at [phone] if you experience these symptoms or fever/chills.  Your incision was closed with Dermabond:  You may shower 1 day after your defibrillator implant and wash your incision with soap and water. Avoid lotions, ointments, or perfumes over your incision until it is well-healed.  You may use a hot tub or a pool after your wound check appointment if the incision is completely closed.  Do not lift, push or pull greater than 10 pounds with the affected arm until 6 weeks after your procedure.  UNTIL AFTER JULY 10TH.  There are no other restrictions in arm movement after your wound check appointment.  Your ICD is designed to protect you from life threatening heart rhythms. Because of this, you may receive a shock.   1 shock with no symptoms:  Call the office during business hours. 1 shock with symptoms (chest pain, chest pressure, dizziness, lightheadedness, shortness of breath, overall feeling unwell):  Call 911. If you experience 2 or more shocks in 24 hours:  Call 911. If you receive a shock, you should not drive.  [place] DMV - no driving for 6 months if you receive appropriate therapy from your ICD.   ICD Alerts:  Some alerts are vibratory and others beep. These are NOT emergencies. Please call our office to let us know. If this occurs at night or on weekends, it can wait until the next business day. Send a remote transmission.  If your device is capable of reading fluid status (for heart failure), you will be offered monthly monitoring to review this with you.   Remote monitoring is used to monitor your ICD from home. This monitoring is scheduled every 91 days by our office. It allows [place] to keep an eye on the functioning of your device to ensure it is working properly. You will routinely see your Electrophysiologist annually (more often if  necessary).

## 2022-08-14 ENCOUNTER — Other Ambulatory Visit (HOSPITAL_COMMUNITY): Payer: Medicare Other

## 2022-08-14 ENCOUNTER — Ambulatory Visit: Payer: Medicare Other | Attending: Cardiology

## 2022-08-14 DIAGNOSIS — I255 Ischemic cardiomyopathy: Secondary | ICD-10-CM | POA: Insufficient documentation

## 2022-08-14 LAB — CUP PACEART INCLINIC DEVICE CHECK
Battery Remaining Longevity: 94 mo
Brady Statistic RV Percent Paced: 0 %
Date Time Interrogation Session: 20240612114533
HighPow Impedance: 66.375
Implantable Lead Connection Status: 753985
Implantable Lead Implant Date: 20240529
Implantable Lead Location: 753860
Implantable Lead Model: 7122
Implantable Pulse Generator Implant Date: 20240529
Lead Channel Impedance Value: 700 Ohm
Lead Channel Pacing Threshold Amplitude: 2.25 V
Lead Channel Pacing Threshold Amplitude: 2.25 V
Lead Channel Pacing Threshold Pulse Width: 0.5 ms
Lead Channel Pacing Threshold Pulse Width: 0.5 ms
Lead Channel Sensing Intrinsic Amplitude: 12 mV
Lead Channel Setting Pacing Amplitude: 4 V
Lead Channel Setting Pacing Pulse Width: 0.5 ms
Lead Channel Setting Sensing Sensitivity: 0.5 mV
Pulse Gen Serial Number: 8938963

## 2022-08-14 NOTE — Progress Notes (Signed)
Wound check appointment. Steri-strips removed. Wound without redness or edema. Incision edges approximated, wound well healed. Normal device function. MD Camnitz reviewed RV pacing thresholds and confirmed at 2.5V at 0.58ms. Programmed RV output to 4V @ 0.46ms. To review further with MD Graciela Husbands. Sensing, impedances consistent with implant measurements. Histogram distribution appropriate for patient and level of activity. No mode switches or ventricular arrhythmias noted. Patient educated about wound care, arm mobility, lifting restrictions, shock plan. ROV in 3 months with implanting physician.

## 2022-08-15 ENCOUNTER — Ambulatory Visit (HOSPITAL_COMMUNITY)
Admission: RE | Admit: 2022-08-15 | Discharge: 2022-08-15 | Disposition: A | Discharge status: Home / Self Care | Payer: Medicare Other | Source: Ambulatory Visit | Attending: Internal Medicine | Admitting: Internal Medicine

## 2022-08-15 VITALS — BP 102/70 | HR 61 | Wt 188.2 lb

## 2022-08-15 DIAGNOSIS — Z8616 Personal history of COVID-19: Secondary | ICD-10-CM | POA: Diagnosis not present

## 2022-08-15 DIAGNOSIS — Z951 Presence of aortocoronary bypass graft: Secondary | ICD-10-CM | POA: Diagnosis not present

## 2022-08-15 DIAGNOSIS — R0602 Shortness of breath: Secondary | ICD-10-CM | POA: Diagnosis present

## 2022-08-15 DIAGNOSIS — N1831 Chronic kidney disease, stage 3a: Secondary | ICD-10-CM | POA: Diagnosis not present

## 2022-08-15 DIAGNOSIS — I5020 Unspecified systolic (congestive) heart failure: Secondary | ICD-10-CM

## 2022-08-15 DIAGNOSIS — Z7982 Long term (current) use of aspirin: Secondary | ICD-10-CM | POA: Insufficient documentation

## 2022-08-15 DIAGNOSIS — I252 Old myocardial infarction: Secondary | ICD-10-CM | POA: Diagnosis not present

## 2022-08-15 DIAGNOSIS — I2510 Atherosclerotic heart disease of native coronary artery without angina pectoris: Secondary | ICD-10-CM | POA: Diagnosis not present

## 2022-08-15 DIAGNOSIS — Z7984 Long term (current) use of oral hypoglycemic drugs: Secondary | ICD-10-CM | POA: Insufficient documentation

## 2022-08-15 DIAGNOSIS — I5022 Chronic systolic (congestive) heart failure: Secondary | ICD-10-CM | POA: Diagnosis not present

## 2022-08-15 DIAGNOSIS — Z79899 Other long term (current) drug therapy: Secondary | ICD-10-CM | POA: Insufficient documentation

## 2022-08-15 DIAGNOSIS — Z86711 Personal history of pulmonary embolism: Secondary | ICD-10-CM | POA: Diagnosis not present

## 2022-08-15 DIAGNOSIS — I255 Ischemic cardiomyopathy: Secondary | ICD-10-CM | POA: Diagnosis not present

## 2022-08-15 MED ORDER — ROSUVASTATIN CALCIUM 40 MG PO TABS: 40.0000 mg | ORAL_TABLET | Freq: Every day | ORAL | 3 refills | Status: DC

## 2022-08-15 MED ORDER — METOPROLOL SUCCINATE ER 25 MG PO TB24: 25.0000 mg | ORAL_TABLET | Freq: Every day | ORAL | 3 refills | Status: DC

## 2022-08-15 NOTE — Patient Instructions (Signed)
It was a pleasure seeing you today!  MEDICATIONS: -We are changing your medications today -Increase metoprolol succinate to 25 mg (1 tablet) daily -Stop atorvastatin, start rosuvastatin 40 mg (1 tablet) daily -Call if you have questions about your medications.   NEXT APPOINTMENT: Return to clinic in 1 month with [facility].  In general, to take care of your heart failure: -Limit your fluid intake to 2 Liters (half-gallon) per day.   -Limit your salt intake to ideally 2-3 grams (2000-3000 mg) per day. -Weigh yourself daily and record, and bring that "weight diary" to your next appointment.  (Weight gain of 2-3 pounds in 1 day typically means fluid weight.) -The medications for your heart are to help your heart and help you live longer.   -Please contact us before stopping any of your heart medications.  Call the clinic at [phone] with questions or to reschedule future appointments.

## 2022-08-16 ENCOUNTER — Telehealth: Payer: Self-pay

## 2022-08-16 DIAGNOSIS — Z95810 Presence of automatic (implantable) cardiac defibrillator: Secondary | ICD-10-CM

## 2022-08-16 NOTE — Telephone Encounter (Signed)
Patient seen on 6/12 for 14 day post op wound check. Single chamber STJ ICD placed on 07/31/22 for ICM.   RV pacing threshold:  2.5v@0 .5ms (confirmed loss with Dr. Elberta Fortis) RV programmed at 4.0V @ 0.68ms per Dr. Elberta Fortis and to review with Dr. Graciela Husbands upon return to office. Patient VP <1%  RV lead impedance and shock impedance WNL.  Otherwise normal device function.

## 2022-08-16 NOTE — Telephone Encounter (Signed)
Spoke with patient. CXR ordered for Monday 08/19/22.  Patient has add on appt to see Dr. Graciela Husbands on 08/28/22 at 415pm.

## 2022-08-16 NOTE — Telephone Encounter (Signed)
Spoke with Dr. Graciela Husbands. Patient needs to get a CXR and schedule an appointment to follow up with Dr. Graciela Husbands in 2 weeks.   Unable to reach patient.  Left general message on phone to call us back.

## 2022-08-19 ENCOUNTER — Ambulatory Visit
Admission: RE | Admit: 2022-08-19 | Discharge: 2022-08-19 | Disposition: A | Discharge status: Home / Self Care | Payer: Medicare Other | Source: Ambulatory Visit | Attending: Internal Medicine | Admitting: Internal Medicine

## 2022-08-19 DIAGNOSIS — Z95810 Presence of automatic (implantable) cardiac defibrillator: Secondary | ICD-10-CM

## 2022-08-20 NOTE — Telephone Encounter (Signed)
Dr. Graciela Husbands reviewed chest x ray, no acute changes in lead position, likely exit block. Patient is scheduled to see Dr. Graciela Husbands on 08/28/22

## 2022-08-28 ENCOUNTER — Encounter: Payer: Self-pay | Admitting: Internal Medicine

## 2022-08-28 ENCOUNTER — Ambulatory Visit: Payer: Medicare Other | Attending: Internal Medicine | Admitting: Internal Medicine

## 2022-08-28 VITALS — BP 104/60 | HR 66 | Ht 67.0 in | Wt 189.0 lb

## 2022-08-28 DIAGNOSIS — I471 Supraventricular tachycardia, unspecified: Secondary | ICD-10-CM

## 2022-08-28 NOTE — Patient Instructions (Addendum)
Medication Instructions:  Your physician recommends that you continue on your current medications as directed. Please refer to the Current Medication list given to you today.  *If you need a refill on your cardiac medications before your next appointment, please call your pharmacy*   Lab Work: None ordered.  If you have labs (blood work) drawn today and your tests are completely normal, you will receive your results only by: MyChart Message (if you have MyChart) OR A paper copy in the mail If you have any lab test that is abnormal or we need to change your treatment, we will call you to review the results.   Testing/Procedures: None ordered.    Follow-Up: At [facility], you and your health needs are our priority.  As part of our continuing mission to provide you with exceptional heart care, we have created designated Provider Care Teams.  These Care Teams include your primary Cardiologist (physician) and Advanced Practice Providers (APPs -  Physician Assistants and Nurse Practitioners) who all work together to provide you with the care you need, when you need it.  We recommend signing up for the patient portal called "MyChart".  Sign up information is provided on this After Visit Summary.  MyChart is used to connect with patients for Virtual Visits (Telemedicine).  Patients are able to view lab/test results, encounter notes, upcoming appointments, etc.  Non-urgent messages can be sent to your provider as well.   To learn more about what you can do with MyChart, go to [email or web address].    Your next appointment:   To be scheduled 

## 2022-08-28 NOTE — Progress Notes (Signed)
Patient Care Team: Jackelyn Poling, DO as PCP - General (Family Medicine) Orbie Pyo, MD as PCP - Cardiology (Cardiology) Laurey Morale, MD as PCP - Advanced Heart Failure (Cardiology)   HPI  Juan Snow is a 67 y.o. male seen in follow-up for an ICD implanted for primary prevention in the setting of ischemic cardiomyopathy complicated by elevated post implant RV pacing threshold  History of MI 1/24 revascularization was not possible he underwent bypass surgery with balloon pump support.  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  There have been no palpitations, lightheadedness or syncope.     DATE TEST EF    3/23 Echo  50-55%    1/24 Echo   20-25 %    4/24 Echo  <20%               Date Cr K Hgb  4/24 1.53 4.5 13.1              Records and Results Reviewed   Past Medical History:  Diagnosis Date   Arthritis    OA   Cough    WHITE SPUTUM OCC, NO FEVER COLD SYMPTOMS MAINLY   GERD (gastroesophageal reflux disease)    Other secondary pulmonary hypertension (HCC) 07/07/2022   TTE 06/10/2022: EF <20, no RWMA, normal RVSF, moderately elevated PASP, mild MR, moderate TR, RAP 3, RVSP 52.6    Past Surgical History:  Procedure Laterality Date   CHOLECYSTECTOMY N/A 03/30/2021   Procedure: LAPAROSCOPIC CHOLECYSTECTOMY WITH INTRAOPERATIVE CHOLANGIOGRAM;  Surgeon: Darnell Level, MD;  Location: [facility] ORS;  Service: General;  Laterality: N/A;   COLONOSCOPY  06/17/2011   Procedure: COLONOSCOPY;  Surgeon: Charna Elizabeth, MD;  Location: [facility] ENDOSCOPY;  Service: Endoscopy;  Laterality: N/A;   CORONARY ARTERY BYPASS GRAFT N/A 03/12/2022   Procedure: CORONARY ARTERY BYPASS GRAFTING (CABG) X TWO BYPASSES USING LEFT INTERNAL MAMMARY ARTERY AND RIGHT GREATER  SAPHENOUS  VEIN HARVEST.;  Surgeon: Corliss Skains, MD;  Location: [facility] OR;  Service: Open Heart Surgery;  Laterality: N/A;   CORONARY/GRAFT ACUTE MI REVASCULARIZATION N/A 03/10/2022   Procedure: Coronary/Graft Acute MI Revascularization;  Surgeon: Orbie Pyo, MD;  Location: [facility] INVASIVE CV LAB;  Service: Cardiovascular;  Laterality: N/A;   IABP INSERTION N/A 03/10/2022   Procedure: IABP Insertion;  Surgeon: Orbie Pyo, MD;  Location: [facility] INVASIVE CV LAB;  Service: Cardiovascular;  Laterality: N/A;   ICD IMPLANT N/A 07/31/2022   Procedure: ICD IMPLANT;  Surgeon: Duke Salvia, MD;  Location: [facility] INVASIVE CV LAB;  Service: Cardiovascular;  Laterality: N/A;   IR ANGIOGRAM PULMONARY BILATERAL SELECTIVE  05/11/2021   IR ANGIOGRAM SELECTIVE EACH ADDITIONAL VESSEL  05/11/2021   IR ANGIOGRAM SELECTIVE EACH ADDITIONAL VESSEL  05/11/2021   IR RADIOLOGIST EVAL & MGMT  06/06/2021   IR RADIOLOGIST EVAL & MGMT  09/06/2021   IR RADIOLOGIST EVAL & MGMT  12/26/2021   IR THROMBECT VENO MECH MOD SED  05/11/2021   IR US GUIDE VASC ACCESS RIGHT  05/11/2021   KNEE SURGERY Right YRS AGO   MENISCUS   LEFT HEART CATH AND CORONARY ANGIOGRAPHY N/A 03/10/2022   Procedure: LEFT HEART CATH AND CORONARY ANGIOGRAPHY;  Surgeon: Orbie Pyo, MD;  Location:  [facility] INVASIVE CV LAB;  Service: Cardiovascular;  Laterality: N/A;   LEFT HEART CATH AND CORONARY ANGIOGRAPHY N/A 03/10/2022   Procedure: LEFT HEART CATH AND CORONARY ANGIOGRAPHY;  Surgeon: Orbie Pyo, MD;  Location: [facility] INVASIVE CV LAB;  Service: Cardiovascular;  Laterality: N/A;   TEE WITHOUT CARDIOVERSION N/A 03/12/2022   Procedure: TRANSESOPHAGEAL ECHOCARDIOGRAM (TEE);  Surgeon: Corliss Skains, MD;  Location: [facility] OR;  Service: Open Heart Surgery;  Laterality: N/A;   THUMB ARTHROSCOPY Right YRS AGO   TOTAL HIP ARTHROPLASTY Left 06/25/2017   Procedure: LEFT TOTAL HIP ARTHROPLASTY ANTERIOR APPROACH;  Surgeon: Ollen Gross, MD;  Location: [facility] ORS;  Service:  Orthopedics;  Laterality: Left;    Current Meds  Medication Sig   apixaban (ELIQUIS) 5 MG TABS tablet Take 1 tablet (5 mg total) by mouth 2 (two) times daily.   Ascorbic Acid (VITAMIN C PO) Take 1 tablet by mouth daily.   aspirin EC 81 MG tablet Take 1 tablet (81 mg total) by mouth daily. Swallow whole.   Cholecalciferol (VITAMIN D-3 PO) Take 1 tablet by mouth daily.   Coenzyme Q10 (CO Q-10 PO) Take 1 capsule by mouth daily.   famotidine-calcium carbonate-magnesium hydroxide (PEPCID COMPLETE) 10-800-165 MG chewable tablet Chew 1 tablet by mouth 2 (two) times daily as needed (acid reflux).   FARXIGA 10 MG TABS tablet TAKE 1 TABLET(10 MG) BY MOUTH DAILY   furosemide (LASIX) 40 MG tablet Take 1 tablet (40 mg total) by mouth as needed (Take one tablet if weight gain of 3 pounds overnight or greater than 5 pounds in 1 week).   Menaquinone-7 (VITAMIN K2 PO) Take 1 tablet by mouth daily.   metoprolol succinate (TOPROL XL) 25 MG 24 hr tablet Take 1 tablet (25 mg total) by mouth daily.   rosuvastatin (CRESTOR) 40 MG tablet Take 1 tablet (40 mg total) by mouth daily.   sacubitril-valsartan (ENTRESTO) 24-26 MG Take 1 tablet by mouth 2 (two) times daily.   sildenafil (REVATIO) 20 MG tablet Take 60-100 mg by mouth as needed (erectile dysfunction).   spironolactone (ALDACTONE) 25 MG tablet TAKE 1 TABLET(25 MG) BY MOUTH DAILY    No Known Allergies    Review of Systems negative except from HPI and PMH  Physical Exam BP 104/60   Pulse 66   Ht 5\' 7"  (1.702 m)   Wt 189 lb (85.7 kg)   SpO2 95%   BMI 29.60 kg/m  Well developed and well nourished in no acute distress HENT normal E scleral and icterus clear Neck Supple JVP flat; carotids brisk and full Clear to ausculation Device pocket well healed; without hematoma or erythema.  There is no tethering Regular rate and rhythm, no murmurs gallops or rub Soft with active bowel sounds No clubbing cyanosis  Edema Alert and oriented, grossly normal  motor and sensory function Skin Warm and Dry  ECG sinus @ 66 17/08/40  CrCl cannot be calculated (Patient's most recent lab result is older than the maximum 21 days allowed.).   Assessment and  Plan Ischemic cardiomyopathy  ICD with elevated RV pacing threshold    CAD s/p CABG   Class Ib/IIa heart failure  Submassive PE history   Renal insufficiency  Estimated Creatinine Clearance: 49.5 mL/min (A) (by C-G formula based on SCr of 1.53 mg/dL (H)).   The patient's pacing threshold has continued to increase.  Sensing is okay.  We will plan to review it again in about 4 weeks and see if he has not reached  the [place] and has begun to descend.  In the event that he does not he is asked the question whether a defibrillator that cannot pace but senses adequately for shock therapy is not sufficient.  This is a very reasonable question.  We will plan to consider this if we need to regarding the possibility of lead revision.  Reviewed post implant instructions  Continue Eliquis  Continue GDMT for his cardiomyopathy  Will discontinue aspirin 6 months post CABG   Current medicines are reviewed at length with the patient today .  The patient does not  have concerns regarding medicines.

## 2022-09-10 NOTE — Progress Notes (Signed)
ADVANCED HF CLINIC PROGRESS NOTE  Primary Care: Jackelyn Poling, DO HF Cardiologist: Dr. Shirlee Latch  HPI: 67 y.o. male with a history of arthritis, GERD and submassive PE 3/23 treated with catheter directed thrombolysis, CKD stage IIIa, CAD s/p CABG x 2, and HFrEF.   Admitted 1/24 w/ acute anterolateral STEMI. Incidentally found to be COVID +. Emergent cath showed high grade ostial LAD to proximal LAD lesion in the 80% stenosis range as well as a 80% ramus intermedius lesion. Ventriculography demonstrated an anterior wall motion abnormality with an LVEDP of 22 mmHg. Given that the patient had TIMI-3 flow in all vessels, percutaneous intervention was deferred. Felt best suited for surgical revascularization. IABP placed and referred for CABG. Pre-op echo showed severely reduced LVEF 25-30%, RV normal, trivial MR w/ AK of the mid and distal anterior wall, mid and distal lateral wall, mid and distal  anterior septum, entire apex, mid anterolateral segment, and mid inferoseptal walls (prior Echo 3/23 in setting of submassive PE w/ mod reduced RV and LVEF 50-55%, repeat limited echo 6/23 showed RV function returned to normal w/ LVEF up to 60-65%). He underwent CABG x 2 w/ LIMA-LAD and SVG-ramus. Intra-op, he developed acute worsening of LV and RV function as well as acute rise in PA pressure (PASP 80s-90s).  Difficult to wean off bypass, but successful wean with iNO, inotropes, pressors, IABP.  Eventually able to wean pressors and remove IABP. Hospitalization complicated by pseudomonas bacteremia, ID consulted and completed IV abx (unclear source). GDMT titrated but limited by AKI. He was discharged home, weight 183 lbs.  Repeat echo in 4/24 showed EF < 20%, mild LV dilation, mild RV dilation with normal RV systolic function, mild MR, normal IVC.   5/24 underwent ICD implant by Dr. Graciela Husbands. St Jude device, single chamber.   He returns today for routine f/u. Here w/ wife. Doing well. Graduated from cardiac  rehab. Now exercising regularly at the gym doing combination of cardio and weights. NYHA Class I-II. Wt stable at home. BP well controlled, 116/72 in clinic today but lower readings at home, typically in the 90s-low 100s systolic per pt report. Denies orthostatic symptoms. No CP. Denies ICD shocks. Device interrogation shows stable impedence. Reports full med compliance. Remains on ASA + Eliquis. Denies abnormal bleeding.     Labs (1/24): K 4.4, creatinine 1.73 Labs (2/24): K 4  Creatinine 1.45  Labs (4/24): K 4.5, creatinine 1.53, LDL 52 Labs (5/24): K 4.4, creatinine 1.54     ECG: not performed   PMH: 1. CKD stage 3 2. Hyperlipidemia 3. CAD: Acute anterolateral STEMI in 1/24.  - LHC with 80% ostial/proximal LAD and 80% ostial ramus.  - CABG with LIMA-LAD and SVG-ramus.  4. Pseudomonas bacteremia (1/24) post-op CABG 5. PE: Submassive in 3/23 6. Chronic systolic CHF: Ischemic cardiomyopathy.  - Echo (6/23): EF 60-65%, normal RV systolic function  - Echo (1/24): EF 25-30%, grade II DD, normal RV  - Echo (4/24): EF < 20%, mild LV dilation, mild RV dilation with normal RV systolic function, mild MR, normal IVC.   Current Outpatient Medications  Medication Sig Dispense Refill   apixaban (ELIQUIS) 5 MG TABS tablet Take 1 tablet (5 mg total) by mouth 2 (two) times daily. 180 tablet 3   Ascorbic Acid (VITAMIN C PO) Take 1 tablet by mouth daily.     aspirin EC 81 MG tablet Take 1 tablet (81 mg total) by mouth daily. Swallow whole. 30 tablet 12   Cholecalciferol (VITAMIN D-3  PO) Take 1 tablet by mouth daily.     Coenzyme Q10 (CO Q-10 PO) Take 1 capsule by mouth daily.     famotidine-calcium carbonate-magnesium hydroxide (PEPCID COMPLETE) 10-800-165 MG chewable tablet Chew 1 tablet by mouth 2 (two) times daily as needed (acid reflux).     FARXIGA 10 MG TABS tablet TAKE 1 TABLET(10 MG) BY MOUTH DAILY 30 tablet 11   furosemide (LASIX) 40 MG tablet Take 1 tablet (40 mg total) by mouth as  needed (Take one tablet if weight gain of 3 pounds overnight or greater than 5 pounds in 1 week). 30 tablet 11   Menaquinone-7 (VITAMIN K2 PO) Take 1 tablet by mouth daily.     metoprolol succinate (TOPROL XL) 25 MG 24 hr tablet Take 1 tablet (25 mg total) by mouth daily. 90 tablet 3   rosuvastatin (CRESTOR) 40 MG tablet Take 1 tablet (40 mg total) by mouth daily. 90 tablet 3   sacubitril-valsartan (ENTRESTO) 24-26 MG Take 1 tablet by mouth 2 (two) times daily. 60 tablet 11   sildenafil (REVATIO) 20 MG tablet Take 60-100 mg by mouth as needed (erectile dysfunction).     spironolactone (ALDACTONE) 25 MG tablet TAKE 1 TABLET(25 MG) BY MOUTH DAILY 90 tablet 0   No current facility-administered medications for this visit.   No Known Allergies  Social History   Socioeconomic History   Marital status: Married    Spouse name: Not on file   Number of children: Not on file   Years of education: Not on file   Highest education level: Not on file  Occupational History   Not on file  Tobacco Use   Smoking status: Never   Smokeless tobacco: Never  Vaping Use   Vaping Use: Never used  Substance and Sexual Activity   Alcohol use: No   Drug use: No   Sexual activity: Not on file  Other Topics Concern   Not on file  Social History Narrative   Not on file   Social Determinants of Health   Financial Resource Strain: Not on file  Food Insecurity: No Food Insecurity (03/10/2022)   Hunger Vital Sign    Worried About Running Out of Food in the Last Year: Never true    Ran Out of Food in the Last Year: Never true  Transportation Needs: No Transportation Needs (03/10/2022)   PRAPARE - Administrator, Civil Service (Medical): No    Lack of Transportation (Non-Medical): No  Physical Activity: Not on file  Stress: Not on file  Social Connections: Not on file  Intimate Partner Violence: Not At Risk (03/10/2022)   Humiliation, Afraid, Rape, and Kick questionnaire    Fear of Current or  Ex-Partner: No    Emotionally Abused: No    Physically Abused: No    Sexually Abused: No   Family History  Problem Relation Age of Onset   CAD Brother    Hypertension Other    Diabetes Neg Hx    There were no vitals taken for this visit.  Wt Readings from Last 3 Encounters:  08/28/22 85.7 kg (189 lb)  08/15/22 85.4 kg (188 lb 3.2 oz)  07/31/22 83.9 kg (185 lb)  PHYSICAL EXAM: General:  Well appearing. No respiratory difficulty HEENT: normal Neck: supple. no JVD. Carotids 2+ bilat; no bruits. No lymphadenopathy or thyromegaly appreciated. Cor: PMI nondisplaced. Regular rate & rhythm. No rubs, gallops or murmurs. Lungs: clear Abdomen: soft, nontender, nondistended. No hepatosplenomegaly. No bruits or masses. Good  bowel sounds. Extremities: no cyanosis, clubbing, rash, edema Neuro: alert & oriented x 3, cranial nerves grossly intact. moves all 4 extremities w/o difficulty. Affect pleasant. .   ASSESSMENT & PLAN: 1. Chronic systolic CHF:  Ischemic cardiomyopathy. Echo in 1/24 with LV EF 25-30%, wall motion abnormalities, normal RV. LHC w/ severe ostial LAD to proximal LAD stenosis as well as a 80% ramus intermedius lesion. S/p CABG x 2 utilizing LIMA to LAD and SVG to Ramus. Repeat Echo in 4/24 showed EF < 20%, mild LV dilation, mild RV dilation with normal RV systolic function, mild MR, normal IVC.  St Jude single chamber ICD placed 5/24.  - NYHA Class I-II. Euvolemic on exam.  No ICD shocks. Stable impedence on device interrogation  - Continue Entresto 24/26 bid. BP too soft for titration (home BP readings 90s-low 100s systolic)  - Continue Farxiga 10 mg daily. - Continue spironolactone 25 mg daily. - Continue Toprol XL 12.5 mg daily.  - Does not need daily loop diuretic  - Check BMP today  2. CAD: s/p anterolateral MI in 1/24 with cath showing 80% ostial LAD stenosis and 80% ostial large ramus stenosis.  Ostial lesions in close proximity made PCI very difficult and CABG was  elected.  Underwent CABG x 2 w/ LIMA-LAD and SVG-ramus on 03/12/22.  - Stable w/o chest pain.  - Continue ASA 81 daily (stop 6 mos-1 year post-CABG since he will continue Eliquis>>he is now 6 months post surgery, will d/w Dr. Shirlee Latch stopping ASA).  - Continue Crestor 40 mg daily (recent LDL at goal, 52 mg/dL) 3. H/o PE: Submassive PE in 3/23, treated with catheter-directed thrombolysis.  - Continue Eliquis.  4. CKD Stage III:   - B/l SCr ~1.5  - BMP today   F/u in 3 months w/ Dr. Lillia Mountain, PA-C 09/10/22

## 2022-09-11 ENCOUNTER — Ambulatory Visit (HOSPITAL_COMMUNITY)
Admission: RE | Admit: 2022-09-11 | Discharge: 2022-09-11 | Disposition: A | Discharge status: Home / Self Care | Payer: Medicare Other | Source: Ambulatory Visit | Attending: Cardiology | Admitting: Cardiology

## 2022-09-11 ENCOUNTER — Encounter (HOSPITAL_COMMUNITY): Payer: Self-pay

## 2022-09-11 VITALS — BP 116/72 | HR 65 | Wt 189.2 lb

## 2022-09-11 DIAGNOSIS — Z8616 Personal history of COVID-19: Secondary | ICD-10-CM | POA: Insufficient documentation

## 2022-09-11 DIAGNOSIS — I252 Old myocardial infarction: Secondary | ICD-10-CM | POA: Insufficient documentation

## 2022-09-11 DIAGNOSIS — Z7982 Long term (current) use of aspirin: Secondary | ICD-10-CM | POA: Insufficient documentation

## 2022-09-11 DIAGNOSIS — Z95810 Presence of automatic (implantable) cardiac defibrillator: Secondary | ICD-10-CM | POA: Insufficient documentation

## 2022-09-11 DIAGNOSIS — Z7901 Long term (current) use of anticoagulants: Secondary | ICD-10-CM | POA: Insufficient documentation

## 2022-09-11 DIAGNOSIS — I255 Ischemic cardiomyopathy: Secondary | ICD-10-CM | POA: Insufficient documentation

## 2022-09-11 DIAGNOSIS — I2510 Atherosclerotic heart disease of native coronary artery without angina pectoris: Secondary | ICD-10-CM | POA: Insufficient documentation

## 2022-09-11 DIAGNOSIS — Z79899 Other long term (current) drug therapy: Secondary | ICD-10-CM | POA: Insufficient documentation

## 2022-09-11 DIAGNOSIS — Z951 Presence of aortocoronary bypass graft: Secondary | ICD-10-CM | POA: Diagnosis not present

## 2022-09-11 DIAGNOSIS — I5022 Chronic systolic (congestive) heart failure: Secondary | ICD-10-CM | POA: Diagnosis present

## 2022-09-11 DIAGNOSIS — Z8249 Family history of ischemic heart disease and other diseases of the circulatory system: Secondary | ICD-10-CM | POA: Insufficient documentation

## 2022-09-11 DIAGNOSIS — N1831 Chronic kidney disease, stage 3a: Secondary | ICD-10-CM | POA: Insufficient documentation

## 2022-09-11 DIAGNOSIS — Z86711 Personal history of pulmonary embolism: Secondary | ICD-10-CM | POA: Diagnosis not present

## 2022-09-11 DIAGNOSIS — I5020 Unspecified systolic (congestive) heart failure: Secondary | ICD-10-CM

## 2022-09-11 LAB — BASIC METABOLIC PANEL
Anion gap: 8 (ref 5–15)
BUN: 24 mg/dL — ABNORMAL HIGH (ref 8–23)
CO2: 21 mmol/L — ABNORMAL LOW (ref 22–32)
Calcium: 9.4 mg/dL (ref 8.9–10.3)
Chloride: 110 mmol/L (ref 98–111)
Creatinine, Ser: 1.72 mg/dL — ABNORMAL HIGH (ref 0.61–1.24)
GFR, Estimated: 43 mL/min — ABNORMAL LOW (ref >60)
Glucose, Bld: 89 mg/dL (ref 70–99)
Potassium: 4.4 mmol/L (ref 3.5–5.1)
Sodium: 139 mmol/L (ref 135–145)

## 2022-09-11 NOTE — Patient Instructions (Signed)
There has been no changes to your medications.  Labs done today, your results will be available in MyChart, we will contact you for abnormal readings.  Your physician recommends that you schedule a follow-up appointment in: 3 months ( October) ** please call the office in Mid August to arrange your follow up appointment. **  If you have any questions or concerns before your next appointment please send us a message through mychart or call our office at [phone].    TO LEAVE A MESSAGE FOR THE NURSE SELECT OPTION 2, PLEASE LEAVE A MESSAGE INCLUDING: YOUR NAME DATE OF BIRTH CALL BACK NUMBER REASON FOR CALL**this is important as we prioritize the call backs  YOU WILL RECEIVE A CALL BACK THE SAME DAY AS LONG AS YOU CALL BEFORE 4:00 PM  At the [facility], you and your health needs are our priority. As part of our continuing mission to provide you with exceptional heart care, we have created designated Provider Care Teams. These Care Teams include your primary Cardiologist (physician) and Advanced Practice Providers (APPs- Physician Assistants and Nurse Practitioners) who all work together to provide you with the care you need, when you need it.   You may see any of the following providers on your designated Care Team at your next follow up: Dr Daniel Bensimhon Dr Dalton McLean Dr. Aditya Sabharwal Amy Clegg, NP Brittainy Simmons, PA Jessica Milford,NP Lindsay Finch, PA Alma Diaz, NP Lauren Kemp, PharmD   Please be sure to bring in all your medications bottles to every appointment.    Thank you for choosing [facility]    

## 2022-09-16 ENCOUNTER — Encounter: Payer: Self-pay | Admitting: Cardiology

## 2022-09-17 ENCOUNTER — Telehealth (HOSPITAL_COMMUNITY): Payer: Self-pay | Admitting: Cardiology

## 2022-09-17 NOTE — Telephone Encounter (Signed)
Pt aware and voiced understanding Med list updated

## 2022-09-17 NOTE — Telephone Encounter (Signed)
-----   Message from [place] sent at 09/17/2022 11:21 AM EDT ----- Please call to notify pt that I discussed w/ Dr. Shirlee Latch. Ok stop daily ASA now. ----- Message ----- From: Laurey Morale, MD Sent: 09/13/2022  11:41 AM EDT To: Brittainy Sherlynn Carbon, PA-C  He can stop with Eliquis use and > 6 months post-CABG ----- Message ----- From: Allayne Butcher, PA-C Sent: 09/13/2022  11:27 AM EDT To: Laurey Morale, MD; Linda Hedges, RN  Pt is now 6 months post CABG. He asked if ok to stop ASA now? No bleeding issues. You had previously mentioned in your note can stop 6-12 months post op. Do you prefer to continue for 12 months given no bleeding issues?

## 2022-10-02 ENCOUNTER — Other Ambulatory Visit (HOSPITAL_COMMUNITY): Payer: Self-pay | Admitting: Cardiology

## 2022-10-15 ENCOUNTER — Ambulatory Visit: Payer: Medicare Other | Attending: Interventional Cardiology

## 2022-10-15 NOTE — Progress Notes (Signed)
ICD check in clinic. Normal device function. Sensing consistent with previous device measurements. Impedance trends stable over time. No mode switches. No ventricular arrhythmias. Histogram distribution appropriate for patient and level of activity. No changes made this session. Device programmed at appropriate safety margins. Device programmed to optimize intrinsic conduction. Estimated longevity 5.8-7.8 years. Patient enrolled in remote follow-up. Patient education completed .  RV threshold improved from 3.25V@0 .5ms to 2.5V@0 .5ms. Will review w/ SK.

## 2022-10-17 ENCOUNTER — Ambulatory Visit: Payer: Medicare Other

## 2022-10-31 LAB — CUP PACEART REMOTE DEVICE CHECK
Battery Remaining Longevity: 82 mo
Battery Remaining Percentage: 88 %
Battery Voltage: 3.11 V
Brady Statistic RV Percent Paced: 1 %
Date Time Interrogation Session: 20240829041914
HighPow Impedance: 66 Ohm
HighPow Impedance: 66 Ohm
Implantable Lead Connection Status: 753985
Implantable Lead Implant Date: 20240529
Implantable Lead Location: 753860
Implantable Lead Model: 7122
Implantable Pulse Generator Implant Date: 20240529
Lead Channel Impedance Value: 710 Ohm
Lead Channel Pacing Threshold Amplitude: 2.5 V
Lead Channel Pacing Threshold Pulse Width: 0.5 ms
Lead Channel Sensing Intrinsic Amplitude: 12 mV
Lead Channel Setting Pacing Amplitude: 4 V
Lead Channel Setting Pacing Pulse Width: 1 ms
Lead Channel Setting Sensing Sensitivity: 0.5 mV
Pulse Gen Serial Number: 8938963

## 2022-11-05 ENCOUNTER — Ambulatory Visit (INDEPENDENT_AMBULATORY_CARE_PROVIDER_SITE_OTHER): Payer: PPO

## 2022-11-05 DIAGNOSIS — I255 Ischemic cardiomyopathy: Secondary | ICD-10-CM

## 2022-11-05 DIAGNOSIS — I5020 Unspecified systolic (congestive) heart failure: Secondary | ICD-10-CM

## 2022-11-12 DIAGNOSIS — Z95810 Presence of automatic (implantable) cardiac defibrillator: Secondary | ICD-10-CM | POA: Insufficient documentation

## 2022-11-12 NOTE — Progress Notes (Signed)
Remote ICD transmission.   

## 2022-11-13 ENCOUNTER — Encounter: Payer: Self-pay | Admitting: Internal Medicine

## 2022-11-13 ENCOUNTER — Ambulatory Visit: Payer: PPO | Attending: Internal Medicine | Admitting: Internal Medicine

## 2022-11-13 VITALS — BP 104/72 | HR 72 | Ht 67.0 in | Wt 191.0 lb

## 2022-11-13 DIAGNOSIS — I5020 Unspecified systolic (congestive) heart failure: Secondary | ICD-10-CM | POA: Diagnosis not present

## 2022-11-13 DIAGNOSIS — Z95810 Presence of automatic (implantable) cardiac defibrillator: Secondary | ICD-10-CM

## 2022-11-13 DIAGNOSIS — I255 Ischemic cardiomyopathy: Secondary | ICD-10-CM | POA: Diagnosis not present

## 2022-11-13 NOTE — Progress Notes (Signed)
Patient Care Team: Jackelyn Poling, DO as PCP - General (Family Medicine) Orbie Pyo, MD as PCP - Cardiology (Cardiology) Laurey Morale, MD as PCP - Advanced Heart Failure (Cardiology)   HPI  Juan Snow is a 67 y.o. male seen in follow-up for an Abbott ICD implanted ((DOI-5/24) for primary prevention in the setting of ischemic cardiomyopathy.  Procedure complicated by elevated post implant RV pacing threshold  History of MI 1/24 revascularization was not possible >>underwent bypass surgery with balloon pump support.  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  There have been no palpitations, lightheadedness or syncope. Marland Kitchen    DATE TEST EF    3/23 Echo  50-55%    1/24 Echo   20-25 %    4/24 Echo  <20%               Date Cr K Hgb  4/24 1.53 4.5 13.1   7/24 1.72         Records and Results Reviewed   Past Medical History:  Diagnosis Date   Arthritis    OA   Cough    WHITE SPUTUM OCC, NO FEVER COLD SYMPTOMS MAINLY   GERD (gastroesophageal reflux disease)    Other secondary pulmonary hypertension (HCC) 07/07/2022   TTE 06/10/2022: EF <20, no RWMA, normal RVSF, moderately elevated PASP, mild MR, moderate TR, RAP 3, RVSP 52.6    Past Surgical History:  Procedure Laterality Date   CHOLECYSTECTOMY N/A 03/30/2021   Procedure: LAPAROSCOPIC CHOLECYSTECTOMY WITH INTRAOPERATIVE CHOLANGIOGRAM;  Surgeon: Darnell Level, MD;  Location: [facility];  Service: General;  Laterality: N/A;   COLONOSCOPY  06/17/2011   Procedure: COLONOSCOPY;  Surgeon: Charna Elizabeth, MD;  Location: [facility];  Service: Endoscopy;  Laterality: N/A;   CORONARY ARTERY BYPASS GRAFT N/A 03/12/2022   Procedure: CORONARY ARTERY BYPASS GRAFTING (CABG) X TWO BYPASSES USING LEFT  INTERNAL MAMMARY ARTERY AND RIGHT GREATER SAPHENOUS  VEIN HARVEST.;  Surgeon: Corliss Skains, MD;  Location: [facility];  Service: Open Heart Surgery;  Laterality: N/A;   CORONARY/GRAFT ACUTE MI REVASCULARIZATION N/A 03/10/2022   Procedure: Coronary/Graft Acute MI Revascularization;  Surgeon: Orbie Pyo, MD;  Location: [facility]AS[facility] CV LAB;  Service: Cardiovascular;  Laterality: N/A;   IABP INSERTION N/A 03/10/2022   Procedure: IABP Insertion;  Surgeon: Orbie Pyo, MD;  Location: [facility];  Service: Cardiovascular;  Laterality: N/A;   ICD IMPLANT N/A 07/31/2022   Procedure: ICD IMPLANT;  Surgeon: Duke Salvia, MD;  Location: [facility] INVASIVE CV LAB;  Service: Cardiovascular;  Laterality: N/A;   IR ANGIOGRAM PULMONARY BILATERAL SELECTIVE  05/11/2021   IR ANGIOGRAM SELECTIVE EACH ADDITIONAL VESSEL  05/11/2021   IR ANGIOGRAM SELECTIVE EACH ADDITIONAL VESSEL  05/11/2021   IR RADIOLOGIST EVAL & MGMT  06/06/2021   IR RADIOLOGIST EVAL & MGMT  09/06/2021   IR RADIOLOGIST EVAL & MGMT  12/26/2021   IR THROMBECT VENO MECH MOD SED  05/11/2021   IR US GUIDE VASC ACCESS RIGHT  05/11/2021   KNEE SURGERY Right YRS AGO   MENISCUS   LEFT HEART CATH AND CORONARY ANGIOGRAPHY N/A 03/10/2022   Procedure: LEFT HEART CATH  AND CORONARY ANGIOGRAPHY;  Surgeon: Orbie Pyo, MD;  Location: [facility] INVASIVE CV LAB;  Service: Cardiovascular;  Laterality: N/A;   LEFT HEART CATH AND CORONARY ANGIOGRAPHY N/A 03/10/2022   Procedure: LEFT HEART CATH AND CORONARY ANGIOGRAPHY;  Surgeon: Orbie Pyo, MD;  Location: [facility];  Service: Cardiovascular;  Laterality: N/A;   TEE WITHOUT CARDIOVERSION N/A 03/12/2022   Procedure: TRANSESOPHAGEAL ECHOCARDIOGRAM (TEE);  Surgeon: Corliss Skains, MD;  Location: [facility] OR;  Service: Open Heart Surgery;  Laterality: N/A;   THUMB ARTHROSCOPY Right YRS AGO   TOTAL HIP ARTHROPLASTY Left 06/25/2017   Procedure: LEFT TOTAL HIP ARTHROPLASTY ANTERIOR APPROACH;  Surgeon: Ollen Gross, MD;  Location: [facility];  Service: Orthopedics;  Laterality: Left;    Current Meds  Medication Sig   apixaban (ELIQUIS) 5 MG TABS tablet Take 1 tablet (5 mg total) by mouth 2 (two) times daily.   Ascorbic Acid (VITAMIN C PO) Take 1 tablet by mouth daily.   Cholecalciferol (VITAMIN D-3 PO) Take 1 tablet by mouth daily.   Coenzyme Q10 (CO Q-10 PO) Take 1 capsule by mouth daily.   famotidine-calcium carbonate-magnesium hydroxide (PEPCID COMPLETE) 10-800-165 MG chewable tablet Chew 1 tablet by mouth 2 (two) times daily as needed (acid reflux).   FARXIGA 10 MG TABS tablet TAKE 1 TABLET(10 MG) BY MOUTH DAILY   furosemide (LASIX) 40 MG tablet Take 1 tablet (40 mg total) by mouth as needed (Take one tablet if weight gain of 3 pounds overnight or greater than 5 pounds in 1 week).   Menaquinone-7 (VITAMIN K2 PO) Take 1 tablet by mouth daily.   metoprolol succinate (TOPROL XL) 25 MG 24 hr tablet Take 1 tablet (25 mg total) by mouth daily.   rosuvastatin (CRESTOR) 40 MG tablet Take 1 tablet (40 mg total) by mouth daily.   sacubitril-valsartan (ENTRESTO) 24-26 MG Take 1 tablet by mouth 2 (two) times daily.   sildenafil (REVATIO) 20 MG tablet Take 60-100 mg by mouth as needed (erectile dysfunction).   spironolactone (ALDACTONE) 25 MG tablet TAKE 1 TABLET(25 MG) BY MOUTH DAILY    No Known Allergies    Review of Systems negative except from HPI and PMH  Physical Exam BP 104/72   Pulse 72   Ht 5\' 7"  (1.702 m)   Wt 191 lb (86.6 kg)   SpO2 95%   BMI 29.91 kg/m  Well developed and well nourished in no acute distress HENT normal Neck supple with JVP-flat Clear Device pocket well healed; without hematoma or erythema.  There is no tethering  Regular rate and rhythm, no  murmur Abd-soft with active BS No Clubbing cyanosis  edema Skin-warm and dry A & Oriented  Grossly normal sensory and motor function  ECG sinus @ 72 21/08/38 Anterior wall MI  Device function is normal. Programming  changes none  See Paceart for details    CrCl cannot be calculated (Patient's most recent lab result is older than the maximum 21 days allowed.).   Assessment and  Plan Ischemic cardiomyopathy  ICD with elevated RV pacing threshold >> improving    CAD s/p CABG   Class Ib/IIa heart failure  Submassive PE history   Renal insufficiency  Estimated Creatinine Clearance: 49.5 mL/min (A) (by C-G formula based on SCr of 1.53 mg/dL (H)).   Pacing threshold has improved--will continue to monitor  Continue Eliquis  Continue GDMT for his cardiomyopathy      Current medicines are reviewed at length with the patient today .  The patient does not  have concerns regarding medicines.

## 2022-11-13 NOTE — Patient Instructions (Signed)
Medication Instructions:  Your physician recommends that you continue on your current medications as directed. Please refer to the Current Medication list given to you today.  *If you need a refill on your cardiac medications before your next appointment, please call your pharmacy*   Lab Work: None ordered.  If you have labs (blood work) drawn today and your tests are completely normal, you will receive your results only by: MyChart Message (if you have MyChart) OR A paper copy in the mail If you have any lab test that is abnormal or we need to change your treatment, we will call you to review the results.   Testing/Procedures: None ordered.    Follow-Up: At  [facility], you and your health needs are our priority.  As part of our continuing mission to provide you with exceptional heart care, we have created designated Provider Care Teams.  These Care Teams include your primary Cardiologist (physician) and Advanced Practice Providers (APPs -  Physician Assistants and Nurse Practitioners) who all work together to provide you with the care you need, when you need it.  We recommend signing up for the patient portal called "MyChart".  Sign up information is provided on this After Visit Summary.  MyChart is used to connect with patients for Virtual Visits (Telemedicine).  Patients are able to view lab/test results, encounter notes, upcoming appointments, etc.  Non-urgent messages can be sent to your provider as well.   To learn more about what you can do with MyChart, go to [email or web address].    Your next appointment:   9 months with Dr Klein 

## 2023-01-01 DIAGNOSIS — I5020 Unspecified systolic (congestive) heart failure: Secondary | ICD-10-CM | POA: Diagnosis not present

## 2023-01-01 DIAGNOSIS — I255 Ischemic cardiomyopathy: Secondary | ICD-10-CM | POA: Diagnosis not present

## 2023-01-01 DIAGNOSIS — Z86718 Personal history of other venous thrombosis and embolism: Secondary | ICD-10-CM | POA: Diagnosis not present

## 2023-01-01 DIAGNOSIS — D696 Thrombocytopenia, unspecified: Secondary | ICD-10-CM | POA: Diagnosis not present

## 2023-01-01 DIAGNOSIS — E559 Vitamin D deficiency, unspecified: Secondary | ICD-10-CM | POA: Diagnosis not present

## 2023-01-01 DIAGNOSIS — E7800 Pure hypercholesterolemia, unspecified: Secondary | ICD-10-CM | POA: Diagnosis not present

## 2023-01-01 DIAGNOSIS — N1831 Chronic kidney disease, stage 3a: Secondary | ICD-10-CM | POA: Diagnosis not present

## 2023-01-01 DIAGNOSIS — Z7901 Long term (current) use of anticoagulants: Secondary | ICD-10-CM | POA: Diagnosis not present

## 2023-01-01 DIAGNOSIS — I252 Old myocardial infarction: Secondary | ICD-10-CM | POA: Diagnosis not present

## 2023-01-09 NOTE — Progress Notes (Signed)
Cardiology Office Note:   Date:  01/13/2023  ID:  Juan Snow, DOB 02/13/1956, MRN [id number] PCP:  Jackelyn Poling, DO  [facility] Providers Cardiologist:  Alverda Skeans, MD Referring MD: Jackelyn Poling, DO  Chief Complaint/Reason for Referral: Cardiology follow-up ASSESSMENT:    1. HFrEF (heart failure with reduced ejection fraction) (HCC)   2. S/P CABG x 2   3. Hyperlipidemia LDL goal <55   4. Aortic atherosclerosis (HCC)   5. Stage 3b chronic kidney disease (HCC)   6. Pulmonary embolism, unspecified chronicity, unspecified pulmonary embolism type, unspecified whether acute cor pulmonale present (HCC)   7. Secondary hypercoagulable state (HCC)   8. BMI 28.0-28.9,adult   9. Preoperative cardiovascular examination     PLAN:   In order of problems listed above: Ischemic cardiomyopathy: Continue Farxiga, Toprol, and spironolactone.  Obtain echocardiogram to evaluate EF. Status post CABG: Continue Eliquis, Crestor, and strict blood pressure control. Hyperlipidemia: Check lipid panel, LFTs today.  Goal LDL is less than 55. Aortic atherosclerosis: Continue Eliquis and statin. Stage III chronic kidney disease: Continue [place] and Entresto for renal protection. Submassive pulmonary embolism: Continue indefinite Eliquis.  Normal RV function on most recent echocardiogram. Secondary hypercoagulable state: No significant bleeding; continue Eliquis. Elevated BMI: Diet and exercise modification. Preoperative cardiovascular examination: The patient is at low risk for colonoscopy and dental work.  He may hold his Eliquis for 3 days prior to the procedures and resume when possible.            Dispo:  Return in about 6 months (around 07/13/2023).      Medication Adjustments/Labs and Tests Ordered: Current medicines are reviewed at length with the patient today.  Concerns regarding medicines are outlined above.  The following changes have been made:  no change   Labs/tests  ordered: Orders Placed This Encounter  Procedures   Lipid Profile   Hepatic function panel   ECHOCARDIOGRAM COMPLETE    Medication Changes: No orders of the defined types were placed in this encounter.   Current medicines are reviewed at length with the patient today.  The patient does not have concerns regarding medicines.  I spent 33 minutes reviewing all clinical data during and prior to this visit including all relevant imaging studies, laboratories, clinical information from other health systems, and prior notes from both Cardiology and other specialties, interviewing the patient, and conducting a complete physical examination in order to formulate a comprehensive and personalized evaluation and treatment plan.  History of Present Illness:      FOCUSED PROBLEM LIST:   STEMI January 2024 CABG LIMA to LAD and vein graft to ramus Ischemic cardiomyopathy/HFrEF TTE 4/24 EF less than 20% Status post ICD 2024 Hyperlipidemia Lp(a) 97.4 Aortic atherosclerosis CT chest 2023 Submassive pulmonary embolism CDT 2023 On indefinite anticoagulation CKD stage III BMI 30 January 2023: The patient returns for follow-up.  I first met the patient in January of this year when he presented with anterior ST elevation myocardial infarction.  Coronary angiography demonstrated high-grade ostial LAD and ramus disease but the patient had TIMI-3 flow.  He ended up having an intra-aortic balloon pump placed due to chest pain.  And I went two-vessel CABG which was complicated by cardiogenic shock.  He was ultimately discharged from the hospital and started on goal-directed medical therapy for severe ischemic cardiomyopathy.  His ejection fraction did not improve and he had a primary prevention ICD device placed.  He saw heart failure in July and was doing well.  He is here with his wife today.  He continues to do very very well.  He is working out on a regular basis.  He denies any shortness of breath,  chest pain, ICD shocks, peripheral edema, or paroxysmal nocturnal dyspnea.  He has had no issues with his medications.  He is required no emergency room visits or hospitalizations.  He is otherwise well and without significant complaints today.          Current Medications: Current Meds  Medication Sig   apixaban (ELIQUIS) 5 MG TABS tablet Take 1 tablet (5 mg total) by mouth 2 (two) times daily.   Ascorbic Acid (VITAMIN C PO) Take 1 tablet by mouth daily.   Cholecalciferol (VITAMIN D-3 PO) Take 1 tablet by mouth daily.   Coenzyme Q10 (CO Q-10 PO) Take 1 capsule by mouth daily.   famotidine-calcium carbonate-magnesium hydroxide (PEPCID COMPLETE) 10-800-165 MG chewable tablet Chew 1 tablet by mouth 2 (two) times daily as needed (acid reflux).   FARXIGA 10 MG TABS tablet TAKE 1 TABLET(10 MG) BY MOUTH DAILY   furosemide (LASIX) 40 MG tablet Take 1 tablet (40 mg total) by mouth as needed (Take one tablet if weight gain of 3 pounds overnight or greater than 5 pounds in 1 week).   Menaquinone-7 (VITAMIN K2 PO) Take 1 tablet by mouth daily.   metoprolol succinate (TOPROL XL) 25 MG 24 hr tablet Take 1 tablet (25 mg total) by mouth daily.   sacubitril-valsartan (ENTRESTO) 24-26 MG Take 1 tablet by mouth 2 (two) times daily.   sildenafil (REVATIO) 20 MG tablet Take 60-100 mg by mouth as needed (erectile dysfunction).   spironolactone (ALDACTONE) 25 MG tablet TAKE 1 TABLET(25 MG) BY MOUTH DAILY     Review of Systems:   Please see the history of present illness.    All other systems reviewed and are negative.     EKGs/Labs/Other Test Reviewed:   EKG: September 2024 EKG that I reviewed demonstrates sinus rhythm with anterior infarction pattern  EKG Interpretation Date/Time:    Ventricular Rate:    PR Interval:    QRS Duration:    QT Interval:    QTC Calculation:   R Axis:      Text Interpretation:           Risk Assessment/Calculations:          Physical Exam:   VS:  BP 116/86    Pulse 69   Ht 5' 7.5" (1.715 m)   Wt 184 lb 12.8 oz (83.8 kg)   SpO2 99%   BMI 28.52 kg/m        Wt Readings from Last 3 Encounters:  01/13/23 184 lb 12.8 oz (83.8 kg)  11/13/22 191 lb (86.6 kg)  09/11/22 189 lb 3.2 oz (85.8 kg)      GENERAL:  No apparent distress, AOx3 HEENT:  No carotid bruits, +2 carotid impulses, no scleral icterus CAR: RRR  no murmurs, gallops, rubs, or thrills RES:  Clear to auscultation bilaterally ABD:  Soft, nontender, nondistended, positive bowel sounds x 4 VASC:  +2 radial pulses, +2 carotid pulses NEURO:  CN 2-12 grossly intact; motor and sensory grossly intact PSYCH:  No active depression or anxiety EXT:  No edema, ecchymosis, or cyanosis  Signed, Orbie Pyo, MD  01/13/2023 9:05 AM    [facility] [address] [place], [place], [place]  [address] Phone: [phone]; Fax: [phone]   Note:  This document was prepared using Dragon  voice recognition software and may include unintentional dictation errors.

## 2023-01-13 ENCOUNTER — Encounter: Payer: Self-pay | Admitting: Internal Medicine

## 2023-01-13 ENCOUNTER — Ambulatory Visit: Payer: PPO | Attending: Internal Medicine | Admitting: Internal Medicine

## 2023-01-13 ENCOUNTER — Encounter: Payer: Self-pay | Admitting: *Deleted

## 2023-01-13 VITALS — BP 116/86 | HR 69 | Ht 67.5 in | Wt 184.8 lb

## 2023-01-13 DIAGNOSIS — I700 Atherosclerosis of aorta: Secondary | ICD-10-CM

## 2023-01-13 DIAGNOSIS — D6869 Other thrombophilia: Secondary | ICD-10-CM

## 2023-01-13 DIAGNOSIS — Z01810 Encounter for preprocedural cardiovascular examination: Secondary | ICD-10-CM

## 2023-01-13 DIAGNOSIS — E785 Hyperlipidemia, unspecified: Secondary | ICD-10-CM | POA: Diagnosis not present

## 2023-01-13 DIAGNOSIS — N1832 Chronic kidney disease, stage 3b: Secondary | ICD-10-CM | POA: Diagnosis not present

## 2023-01-13 DIAGNOSIS — Z951 Presence of aortocoronary bypass graft: Secondary | ICD-10-CM | POA: Diagnosis not present

## 2023-01-13 DIAGNOSIS — I5020 Unspecified systolic (congestive) heart failure: Secondary | ICD-10-CM | POA: Diagnosis not present

## 2023-01-13 DIAGNOSIS — I2699 Other pulmonary embolism without acute cor pulmonale: Secondary | ICD-10-CM | POA: Diagnosis not present

## 2023-01-13 DIAGNOSIS — Z6828 Body mass index (BMI) 28.0-28.9, adult: Secondary | ICD-10-CM | POA: Diagnosis not present

## 2023-01-13 LAB — HEPATIC FUNCTION PANEL
ALT: 17 [IU]/L (ref 0–44)
AST: 12 [IU]/L (ref 0–40)
Albumin: 4.3 g/dL (ref 3.9–4.9)
Alkaline Phosphatase: 55 [IU]/L (ref 44–121)
Bilirubin Total: 0.5 mg/dL (ref 0.0–1.2)
Bilirubin, Direct: 0.17 mg/dL (ref 0.00–0.40)
Total Protein: 6.6 g/dL (ref 6.0–8.5)

## 2023-01-13 LAB — LIPID PANEL
Chol/HDL Ratio: 2.6 ratio (ref 0.0–5.0)
Cholesterol, Total: 126 mg/dL (ref 100–199)
HDL: 48 mg/dL (ref >39)
LDL Chol Calc (NIH): 63 mg/dL (ref 0–99)
Triglycerides: 73 mg/dL (ref 0–149)
VLDL Cholesterol Cal: 15 mg/dL (ref 5–40)

## 2023-01-13 NOTE — Patient Instructions (Addendum)
Medication Instructions:   Your physician recommends that you continue on your current medications as directed. Please refer to the Current Medication list given to you today.  *If you need a refill on your cardiac medications before your next appointment, please call your pharmacy*   Lab Work:  TODAY--LIPIDS AND LFTs  If you have labs (blood work) drawn today and your tests are completely normal, you will receive your results only by: MyChart Message (if you have MyChart) OR A paper copy in the mail If you have any lab test that is abnormal or we need to change your treatment, we will call you to review the results.   Testing/Procedures:  Your physician has requested that you have an echocardiogram. Echocardiography is a painless test that uses sound waves to create images of your heart. It provides your doctor with information about the size and shape of your heart and how well your heart's chambers and valves are working. This procedure takes approximately one hour. There are no restrictions for this procedure. PATIENT WOULD LIKE HIS ECHO TO BE SCHEDULED AT [place] VS OUR OFFICE   Please do NOT wear cologne, perfume, aftershave, or lotions (deodorant is allowed). Please arrive 15 minutes prior to your appointment time.  Please note: We ask at that you not bring children with you during ultrasound (echo/ vascular) testing. Due to room size and safety concerns, children are not allowed in the ultrasound rooms during exams. Our front office staff cannot provide observation of children in our lobby area while testing is being conducted. An adult accompanying a patient to their appointment will only be allowed in the ultrasound room at the discretion of the ultrasound technician under special circumstances. We apologize for any inconvenience.    Follow-Up: At [facility], you and your health needs are our priority.  As part of our continuing mission to provide you with  exceptional heart care, we have created designated Provider Care Teams.  These Care Teams include your primary Cardiologist (physician) and Advanced Practice Providers (APPs -  Physician Assistants and Nurse Practitioners) who all work together to provide you with the care you need, when you need it.  We recommend signing up for the patient portal called "MyChart".  Sign up information is provided on this After Visit Summary.  MyChart is used to connect with patients for Virtual Visits (Telemedicine).  Patients are able to view lab/test results, encounter notes, upcoming appointments, etc.  Non-urgent messages can be sent to your provider as well.   To learn more about what you can do with MyChart, go to [email or web address].    Your next appointment:   6 month(s)  Provider:   Jari Favre, PA-C, Ronie Spies, PA-C, Robin Searing, NP, Jacolyn Reedy, PA-C, Eligha Bridegroom, NP, Tereso Newcomer, PA-C, or Perlie Gold, PA-C

## 2023-01-24 ENCOUNTER — Telehealth: Payer: Self-pay | Admitting: Internal Medicine

## 2023-01-24 DIAGNOSIS — E785 Hyperlipidemia, unspecified: Secondary | ICD-10-CM

## 2023-01-24 MED ORDER — EZETIMIBE 10 MG PO TABS: 10.0000 mg | ORAL_TABLET | Freq: Every day | ORAL | 3 refills | Status: DC

## 2023-01-24 NOTE — Telephone Encounter (Signed)
It would be best if LDL were < 55, start zetia 10 and check lipids/lfts in 2 months .   Zetia prescription sent to pharmacy.  Lab orders placed to return for blood work in 2 months. Reqs mailed to patient's home address.

## 2023-01-24 NOTE — Telephone Encounter (Signed)
Pt c/o medication issue:  1. Name of Medication:  Zetia  2. How are you currently taking this medication (dosage and times per day)?   Not started yet  3. Are you having a reaction (difficulty breathing--STAT)?   4. What is your medication issue?   Patient stated he discussed starting on this medication and wants to get a prescription sent to [id number] - [place], [place] - [address]

## 2023-01-28 DIAGNOSIS — I130 Hypertensive heart and chronic kidney disease with heart failure and stage 1 through stage 4 chronic kidney disease, or unspecified chronic kidney disease: Secondary | ICD-10-CM | POA: Diagnosis not present

## 2023-01-28 DIAGNOSIS — K219 Gastro-esophageal reflux disease without esophagitis: Secondary | ICD-10-CM | POA: Diagnosis not present

## 2023-01-28 DIAGNOSIS — E785 Hyperlipidemia, unspecified: Secondary | ICD-10-CM | POA: Diagnosis not present

## 2023-01-28 DIAGNOSIS — E663 Overweight: Secondary | ICD-10-CM | POA: Diagnosis not present

## 2023-01-28 DIAGNOSIS — I2510 Atherosclerotic heart disease of native coronary artery without angina pectoris: Secondary | ICD-10-CM | POA: Diagnosis not present

## 2023-01-28 DIAGNOSIS — E261 Secondary hyperaldosteronism: Secondary | ICD-10-CM | POA: Diagnosis not present

## 2023-01-28 DIAGNOSIS — I252 Old myocardial infarction: Secondary | ICD-10-CM | POA: Diagnosis not present

## 2023-01-28 DIAGNOSIS — Z7901 Long term (current) use of anticoagulants: Secondary | ICD-10-CM | POA: Diagnosis not present

## 2023-01-28 DIAGNOSIS — I509 Heart failure, unspecified: Secondary | ICD-10-CM | POA: Diagnosis not present

## 2023-01-28 DIAGNOSIS — I10 Essential (primary) hypertension: Secondary | ICD-10-CM | POA: Diagnosis not present

## 2023-01-28 DIAGNOSIS — N1832 Chronic kidney disease, stage 3b: Secondary | ICD-10-CM | POA: Diagnosis not present

## 2023-01-28 DIAGNOSIS — N529 Male erectile dysfunction, unspecified: Secondary | ICD-10-CM | POA: Diagnosis not present

## 2023-01-29 ENCOUNTER — Ambulatory Visit (HOSPITAL_COMMUNITY)
Admission: RE | Admit: 2023-01-29 | Discharge: 2023-01-29 | Disposition: A | Discharge status: Home / Self Care | Payer: PPO | Source: Ambulatory Visit | Attending: Internal Medicine | Admitting: Internal Medicine

## 2023-01-29 DIAGNOSIS — R079 Chest pain, unspecified: Secondary | ICD-10-CM | POA: Insufficient documentation

## 2023-01-29 DIAGNOSIS — I252 Old myocardial infarction: Secondary | ICD-10-CM | POA: Insufficient documentation

## 2023-01-29 DIAGNOSIS — I272 Pulmonary hypertension, unspecified: Secondary | ICD-10-CM | POA: Diagnosis not present

## 2023-01-29 DIAGNOSIS — I071 Rheumatic tricuspid insufficiency: Secondary | ICD-10-CM | POA: Diagnosis not present

## 2023-01-29 DIAGNOSIS — Z951 Presence of aortocoronary bypass graft: Secondary | ICD-10-CM | POA: Diagnosis not present

## 2023-01-29 DIAGNOSIS — I2510 Atherosclerotic heart disease of native coronary artery without angina pectoris: Secondary | ICD-10-CM | POA: Insufficient documentation

## 2023-01-29 DIAGNOSIS — I5040 Unspecified combined systolic (congestive) and diastolic (congestive) heart failure: Secondary | ICD-10-CM | POA: Diagnosis not present

## 2023-01-29 DIAGNOSIS — R7881 Bacteremia: Secondary | ICD-10-CM | POA: Diagnosis not present

## 2023-01-29 DIAGNOSIS — I5020 Unspecified systolic (congestive) heart failure: Secondary | ICD-10-CM

## 2023-01-29 DIAGNOSIS — I351 Nonrheumatic aortic (valve) insufficiency: Secondary | ICD-10-CM | POA: Diagnosis not present

## 2023-01-29 LAB — ECHOCARDIOGRAM COMPLETE
Area-P 1/2: 8.29 cm2
Calc EF: 7.9 %
Est EF: <20
P 1/2 time: 347 ms
S' Lateral: 5.6 cm
Single Plane A2C EF: -2.7 %
Single Plane A4C EF: 22.8 %

## 2023-01-29 MED ORDER — PERFLUTREN LIPID MICROSPHERE
1.0000 mL | INTRAVENOUS | Status: AC | PRN
Start: 2023-01-29 — End: 2023-01-29
  Administered 2023-01-29: 2 mL via INTRAVENOUS

## 2023-01-29 NOTE — Progress Notes (Signed)
  Echocardiogram 2D Echocardiogram has been performed.  Juan Snow 01/29/2023, 12:27 PM

## 2023-02-01 LAB — CUP PACEART REMOTE DEVICE CHECK
Battery Remaining Longevity: 89 mo
Battery Remaining Percentage: 87 %
Battery Voltage: 3.07 V
Brady Statistic RV Percent Paced: 1 %
Date Time Interrogation Session: 20241128040016
HighPow Impedance: 82 Ohm
HighPow Impedance: 82 Ohm
Implantable Lead Connection Status: 753985
Implantable Lead Implant Date: 20240529
Implantable Lead Location: 753860
Implantable Lead Model: 7122
Implantable Pulse Generator Implant Date: 20240529
Lead Channel Impedance Value: 590 Ohm
Lead Channel Pacing Threshold Amplitude: 2 V
Lead Channel Pacing Threshold Pulse Width: 0.5 ms
Lead Channel Sensing Intrinsic Amplitude: 12 mV
Lead Channel Setting Pacing Amplitude: 2.5 V
Lead Channel Setting Pacing Pulse Width: 1 ms
Lead Channel Setting Sensing Sensitivity: 0.5 mV
Pulse Gen Serial Number: 8938963

## 2023-02-03 ENCOUNTER — Ambulatory Visit (INDEPENDENT_AMBULATORY_CARE_PROVIDER_SITE_OTHER): Payer: PPO

## 2023-02-03 DIAGNOSIS — I255 Ischemic cardiomyopathy: Secondary | ICD-10-CM

## 2023-02-03 DIAGNOSIS — I5020 Unspecified systolic (congestive) heart failure: Secondary | ICD-10-CM | POA: Diagnosis not present

## 2023-04-04 DIAGNOSIS — E785 Hyperlipidemia, unspecified: Secondary | ICD-10-CM | POA: Diagnosis not present

## 2023-04-04 LAB — LIPID PANEL
Chol/HDL Ratio: 2 {ratio} (ref 0.0–5.0)
Cholesterol, Total: 95 mg/dL — ABNORMAL LOW (ref 100–199)
HDL: 47 mg/dL (ref >39)
LDL Chol Calc (NIH): 34 mg/dL (ref 0–99)
Triglycerides: 62 mg/dL (ref 0–149)
VLDL Cholesterol Cal: 14 mg/dL (ref 5–40)

## 2023-04-04 LAB — HEPATIC FUNCTION PANEL
ALT: 18 [IU]/L (ref 0–44)
AST: 14 [IU]/L (ref 0–40)
Albumin: 4.2 g/dL (ref 3.9–4.9)
Alkaline Phosphatase: 51 [IU]/L (ref 44–121)
Bilirubin Total: 0.5 mg/dL (ref 0.0–1.2)
Bilirubin, Direct: 0.21 mg/dL (ref 0.00–0.40)
Total Protein: 6.3 g/dL (ref 6.0–8.5)

## 2023-04-05 ENCOUNTER — Encounter: Payer: Self-pay | Admitting: Internal Medicine

## 2023-05-05 LAB — CUP PACEART REMOTE DEVICE CHECK
Battery Remaining Longevity: 87 mo
Battery Remaining Percentage: 85 %
Battery Voltage: 3.05 V
Brady Statistic RV Percent Paced: 1 %
Date Time Interrogation Session: 20250227040018
HighPow Impedance: 77 Ohm
HighPow Impedance: 77 Ohm
Implantable Lead Connection Status: 753985
Implantable Lead Implant Date: 20240529
Implantable Lead Location: 753860
Implantable Lead Model: 7122
Implantable Pulse Generator Implant Date: 20240529
Lead Channel Impedance Value: 680 Ohm
Lead Channel Pacing Threshold Amplitude: 2 V
Lead Channel Pacing Threshold Pulse Width: 0.5 ms
Lead Channel Sensing Intrinsic Amplitude: 12 mV
Lead Channel Setting Pacing Amplitude: 2.5 V
Lead Channel Setting Pacing Pulse Width: 1 ms
Lead Channel Setting Sensing Sensitivity: 0.5 mV
Pulse Gen Serial Number: 8938963

## 2023-05-14 ENCOUNTER — Other Ambulatory Visit (HOSPITAL_COMMUNITY): Payer: Self-pay | Admitting: Cardiology

## 2023-05-20 ENCOUNTER — Encounter: Payer: Self-pay | Admitting: Internal Medicine

## 2023-05-20 ENCOUNTER — Other Ambulatory Visit (HOSPITAL_COMMUNITY): Payer: Self-pay | Admitting: Cardiology

## 2023-05-23 NOTE — Telephone Encounter (Signed)
 Please tell him it was normal And I hit the wrong button and am glad he is paying attention But I have not figured out how to undo the wrong button Sorry to him and thanks to you Sk

## 2023-06-16 ENCOUNTER — Other Ambulatory Visit (HOSPITAL_COMMUNITY): Payer: Self-pay

## 2023-06-16 DIAGNOSIS — I5020 Unspecified systolic (congestive) heart failure: Secondary | ICD-10-CM

## 2023-06-16 MED ORDER — FARXIGA 10 MG PO TABS: 10.0000 mg | ORAL_TABLET | Freq: Every day | ORAL | 1 refills | Status: DC

## 2023-06-23 ENCOUNTER — Other Ambulatory Visit (HOSPITAL_COMMUNITY): Payer: Self-pay | Admitting: Cardiology

## 2023-06-23 DIAGNOSIS — N1831 Chronic kidney disease, stage 3a: Secondary | ICD-10-CM | POA: Diagnosis not present

## 2023-06-23 DIAGNOSIS — E7800 Pure hypercholesterolemia, unspecified: Secondary | ICD-10-CM | POA: Diagnosis not present

## 2023-06-23 DIAGNOSIS — I5020 Unspecified systolic (congestive) heart failure: Secondary | ICD-10-CM | POA: Diagnosis not present

## 2023-06-23 DIAGNOSIS — Z86718 Personal history of other venous thrombosis and embolism: Secondary | ICD-10-CM | POA: Diagnosis not present

## 2023-06-23 DIAGNOSIS — Z7901 Long term (current) use of anticoagulants: Secondary | ICD-10-CM | POA: Diagnosis not present

## 2023-06-23 DIAGNOSIS — E559 Vitamin D deficiency, unspecified: Secondary | ICD-10-CM | POA: Diagnosis not present

## 2023-06-23 DIAGNOSIS — I252 Old myocardial infarction: Secondary | ICD-10-CM | POA: Diagnosis not present

## 2023-06-23 DIAGNOSIS — I255 Ischemic cardiomyopathy: Secondary | ICD-10-CM | POA: Diagnosis not present

## 2023-06-27 DIAGNOSIS — I255 Ischemic cardiomyopathy: Secondary | ICD-10-CM | POA: Diagnosis not present

## 2023-06-27 DIAGNOSIS — N1831 Chronic kidney disease, stage 3a: Secondary | ICD-10-CM | POA: Diagnosis not present

## 2023-06-27 DIAGNOSIS — Z0000 Encounter for general adult medical examination without abnormal findings: Secondary | ICD-10-CM | POA: Diagnosis not present

## 2023-06-27 DIAGNOSIS — I252 Old myocardial infarction: Secondary | ICD-10-CM | POA: Diagnosis not present

## 2023-06-27 DIAGNOSIS — E559 Vitamin D deficiency, unspecified: Secondary | ICD-10-CM | POA: Diagnosis not present

## 2023-06-27 DIAGNOSIS — I5020 Unspecified systolic (congestive) heart failure: Secondary | ICD-10-CM | POA: Diagnosis not present

## 2023-06-27 DIAGNOSIS — Z125 Encounter for screening for malignant neoplasm of prostate: Secondary | ICD-10-CM | POA: Diagnosis not present

## 2023-06-27 DIAGNOSIS — E7800 Pure hypercholesterolemia, unspecified: Secondary | ICD-10-CM | POA: Diagnosis not present

## 2023-06-27 DIAGNOSIS — Z7901 Long term (current) use of anticoagulants: Secondary | ICD-10-CM | POA: Diagnosis not present

## 2023-06-27 DIAGNOSIS — E569 Vitamin deficiency, unspecified: Secondary | ICD-10-CM | POA: Diagnosis not present

## 2023-06-27 DIAGNOSIS — R7303 Prediabetes: Secondary | ICD-10-CM | POA: Diagnosis not present

## 2023-06-28 ENCOUNTER — Other Ambulatory Visit (HOSPITAL_COMMUNITY): Payer: Self-pay | Admitting: Cardiology

## 2023-07-31 NOTE — Progress Notes (Unsigned)
 OFFICE NOTE Date:  08/01/2023  ID:  Juan Snow, DOB 19-Aug-1955, MRN [id number] PCP: Mordechai April, DO  [facility] Providers Cardiologist:  Kyra Phy, MD Electrophysiologist:  Richardo Chandler, MD  Advanced Heart Failure:  Peder Bourdon, MD     Patient Profile:     Coronary artery disease Anterolateral STEMI 03/10/2022 Tx w CABG x 2 (LIMA-LAD, SVG-RI) High grade oLAD and RI stenosis >> managed w IABP C/b Supraventricular Tachycardia/atrial ectopy; AKI (SCr ? 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 TTE 03/11/2022: EF 25-30, GR 2 DD, normal RVSF, mildly elev PASP, trivial MR, RAP 3 Limited TTE 03/25/22: EF 25-30, Gr 2 DD, trivial MR, dist ant-sept/inf-sept/apical AK; inf/apico-lat/mid ant-lat/apico-ant/basal inf-sept HK TTE 06/10/2022: EF <20, no RWMA, normal RVSF, moderately elevated PASP, mild MR, moderate TR, RAP 3, RVSP 52.6 TTE 01/29/2023: EF <20, GR 3 DD, apical DK, inferior HK, anterior and anteroseptal AK, severely reduced RVSF, RVSP 37.8, moderate LAE Nonsustained ventricular tachycardia S/p single-chamber ICD 07/2022 Pulmonary embolism S/p submassive PE in 05/2021-Tx catheter directed thrombolysis; Xarelto  Rx Hyperlipidemia Pre-diabetes  Chronic kidney disease       Discussed the use of AI scribe software for clinical note transcription with the patient, who gave verbal consent to proceed. History of Present Illness Juan Snow is a 68 y.o. male who returns for follow-up of CHF, CAD.  He was last seen by Dr. Lorie Rook in November 2024.  He is here alone.  He remains active, engaging in gym workouts three to five times a week, playing golf twice a week, and walking daily for 25 to 40 minutes without any chest discomfort, pressure, or tightness.  He has not had shortness of breath, fatigue, dizziness, or leg pain when walking. He has an ICD and reports no shocks from the device. No issues with his current  anticoagulation regimen.    Review of Systems  Cardiovascular:  Negative for claudication.  -See HPI    Studies Reviewed:      Results LABS Total cholesterol: 104 (06/23/2023) HDL: 49 (06/23/2023) LDL: 37 (06/23/2023) Triglycerides: 86 (06/23/2023) Hemoglobin: 17.3 (06/23/2023) Potassium: 4.8 (06/23/2023)  Risk Assessment/Calculations:        Physical Exam:  VS:  BP 109/66   Pulse 62   Ht 5\' 8"  (1.727 m)   Wt 194 lb (88 kg)   SpO2 99%   BMI 29.50 kg/m    Wt Readings from Last 3 Encounters:  08/01/23 194 lb (88 kg)  01/13/23 184 lb 12.8 oz (83.8 kg)  11/13/22 191 lb (86.6 kg)    Constitutional:      Appearance: Healthy appearance. Not in distress.  Neck:     Vascular: JVD normal.  Pulmonary:     Breath sounds: Normal breath sounds. No wheezing. No rales.  Cardiovascular:     Normal rate. Regular rhythm.     Murmurs: There is no murmur.  Edema:    Peripheral edema absent.  Abdominal:     Palpations: Abdomen is soft.      Assessment and Plan: Assessment & Plan HFrEF (heart failure with reduced ejection fraction) (HCC) Ischemic cardiomyopathy, EF <20% by echocardiogram (November 2024). NYHA class I.  He is very active with no symptoms of fluid overload or dyspnea.  - Continue Farxiga  10 mg daily - Continue Toprol -XL 25 mg daily - Continue Entresto  24/26 mg twice a day - Continue spironolactone  25 mg daily - Follow-up 6 months Coronary artery disease involving native coronary artery  of native heart without angina pectoris S/p anterolateral STEMI (January 2024) followed by CABG. He is not having chest symptoms suggestive of angina.  He is not on antiplatelet therapy due to chronic anticoagulation with Eliquis . Excellent functional status with no physical activity limitations. - Continue Crestor  40 mg daily - Continue Zetia  10 mg daily Hyperlipidemia LDL goal <70 LDL is optimal at 37 mg/dL.  - Continue Crestor  40 mg daily - Continue Zetia  10 mg daily History  of pulmonary embolism History of submassive pulmonary embolism in 2023. On long-term anticoagulation with Eliquis .  - Continue Eliquis  5 mg twice daily - Refill Eliquis  ICD (implantable cardioverter-defibrillator) in place - STJ Follow-up with EP as planned. Preoperative cardiovascular examination He notes that he needs a routine colonoscopy soon.  He may proceed at acceptable CV risk.  He can achieve greater than 4 METS.  I have asked him to make sure his gastroenterologist reaches out to our office to help coordinate holding his anticoagulation.        Dispo:  Return in about 6 months (around 02/01/2024) for Routine Follow Up, w/ Dr. Lorie Rook, or Marlyse Single, PA-C. Signed, Marlyse Single, PA-C

## 2023-08-01 ENCOUNTER — Ambulatory Visit: Attending: Physician Assistant | Admitting: Physician Assistant

## 2023-08-01 ENCOUNTER — Encounter: Payer: Self-pay | Admitting: Physician Assistant

## 2023-08-01 VITALS — BP 109/66 | HR 62 | Ht 68.0 in | Wt 194.0 lb

## 2023-08-01 DIAGNOSIS — I2510 Atherosclerotic heart disease of native coronary artery without angina pectoris: Secondary | ICD-10-CM | POA: Diagnosis not present

## 2023-08-01 DIAGNOSIS — Z95810 Presence of automatic (implantable) cardiac defibrillator: Secondary | ICD-10-CM

## 2023-08-01 DIAGNOSIS — E785 Hyperlipidemia, unspecified: Secondary | ICD-10-CM | POA: Diagnosis not present

## 2023-08-01 DIAGNOSIS — Z01810 Encounter for preprocedural cardiovascular examination: Secondary | ICD-10-CM | POA: Diagnosis not present

## 2023-08-01 DIAGNOSIS — Z86711 Personal history of pulmonary embolism: Secondary | ICD-10-CM | POA: Diagnosis not present

## 2023-08-01 DIAGNOSIS — I5020 Unspecified systolic (congestive) heart failure: Secondary | ICD-10-CM | POA: Diagnosis not present

## 2023-08-01 MED ORDER — METOPROLOL SUCCINATE ER 25 MG PO TB24: 25.0000 mg | ORAL_TABLET | Freq: Every day | ORAL | 3 refills | Status: DC

## 2023-08-01 MED ORDER — ENTRESTO 24-26 MG PO TABS: 1.0000 | ORAL_TABLET | Freq: Two times a day (BID) | ORAL | 3 refills | Status: DC

## 2023-08-01 MED ORDER — SPIRONOLACTONE 25 MG PO TABS: 25.0000 mg | ORAL_TABLET | Freq: Every day | ORAL | 3 refills | Status: DC

## 2023-08-01 MED ORDER — APIXABAN 5 MG PO TABS: 5.0000 mg | ORAL_TABLET | Freq: Two times a day (BID) | ORAL | 3 refills | Status: DC

## 2023-08-01 MED ORDER — FARXIGA 10 MG PO TABS: 10.0000 mg | ORAL_TABLET | Freq: Every day | ORAL | 3 refills | Status: DC

## 2023-08-01 NOTE — Assessment & Plan Note (Signed)
Follow up with EP as planned. 

## 2023-08-01 NOTE — Patient Instructions (Signed)
 Medication Instructions:  No changes *If you need a refill on your cardiac medications before your next appointment, please call your pharmacy*  Follow-Up: At [facility], you and your health needs are our priority.  As part of our continuing mission to provide you with exceptional heart care, our providers are all part of one team.  This team includes your primary Cardiologist (physician) and Advanced Practice Providers or APPs (Physician Assistants and Nurse Practitioners) who all work together to provide you with the care you need, when you need it.  Your next appointment:   6 month(s)  Provider:   Arun K Thukkani, MD or Marlyse Single, PA-C          We recommend signing up for the patient portal called "MyChart".  Sign up information is provided on this After Visit Summary.  MyChart is used to connect with patients for Virtual Visits (Telemedicine).  Patients are able to view lab/test results, encounter notes, upcoming appointments, etc.  Non-urgent messages can be sent to your provider as well.   To learn more about what you can do with MyChart, go to [email or web address].   Other Instructions

## 2023-08-01 NOTE — Assessment & Plan Note (Signed)
 LDL is optimal at 37 mg/dL.  - Continue Crestor  40 mg daily - Continue Zetia  10 mg daily

## 2023-08-01 NOTE — Assessment & Plan Note (Signed)
 S/p anterolateral STEMI (January 2024) followed by CABG. He is not having chest symptoms suggestive of angina.  He is not on antiplatelet therapy due to chronic anticoagulation with Eliquis . Excellent functional status with no physical activity limitations. - Continue Crestor  40 mg daily - Continue Zetia  10 mg daily

## 2023-08-01 NOTE — Assessment & Plan Note (Signed)
 Ischemic cardiomyopathy, EF <20% by echocardiogram (November 2024). NYHA class I.  He is very active with no symptoms of fluid overload or dyspnea.  - Continue Farxiga  10 mg daily - Continue Toprol -XL 25 mg daily - Continue Entresto  24/26 mg twice a day - Continue spironolactone  25 mg daily - Follow-up 6 months

## 2023-08-04 ENCOUNTER — Ambulatory Visit (INDEPENDENT_AMBULATORY_CARE_PROVIDER_SITE_OTHER): Payer: Self-pay

## 2023-08-04 DIAGNOSIS — I255 Ischemic cardiomyopathy: Secondary | ICD-10-CM

## 2023-08-04 DIAGNOSIS — I5020 Unspecified systolic (congestive) heart failure: Secondary | ICD-10-CM

## 2023-08-04 LAB — CUP PACEART REMOTE DEVICE CHECK
Battery Remaining Longevity: 85 mo
Battery Remaining Percentage: 83 %
Battery Voltage: 3.04 V
Brady Statistic RV Percent Paced: 1 %
Date Time Interrogation Session: 20250602020018
HighPow Impedance: 77 Ohm
HighPow Impedance: 77 Ohm
Implantable Lead Connection Status: 753985
Implantable Lead Implant Date: 20240529
Implantable Lead Location: 753860
Implantable Lead Model: 7122
Implantable Pulse Generator Implant Date: 20240529
Lead Channel Impedance Value: 590 Ohm
Lead Channel Pacing Threshold Amplitude: 2 V
Lead Channel Pacing Threshold Pulse Width: 0.5 ms
Lead Channel Sensing Intrinsic Amplitude: 12 mV
Lead Channel Setting Pacing Amplitude: 2.5 V
Lead Channel Setting Pacing Pulse Width: 1 ms
Lead Channel Setting Sensing Sensitivity: 0.5 mV
Pulse Gen Serial Number: 8938963

## 2023-08-08 ENCOUNTER — Other Ambulatory Visit (HOSPITAL_COMMUNITY): Payer: Self-pay | Admitting: Cardiology

## 2023-08-11 ENCOUNTER — Encounter: Admitting: Internal Medicine

## 2023-08-13 ENCOUNTER — Ambulatory Visit: Payer: Self-pay | Admitting: Cardiology

## 2023-08-25 ENCOUNTER — Encounter: Payer: Self-pay | Admitting: Cardiology

## 2023-08-25 ENCOUNTER — Ambulatory Visit: Attending: Cardiology | Admitting: Cardiology

## 2023-08-25 VITALS — BP 110/68 | HR 57 | Ht 68.0 in | Wt 187.0 lb

## 2023-08-25 DIAGNOSIS — Z95810 Presence of automatic (implantable) cardiac defibrillator: Secondary | ICD-10-CM | POA: Diagnosis not present

## 2023-08-25 DIAGNOSIS — I5020 Unspecified systolic (congestive) heart failure: Secondary | ICD-10-CM | POA: Diagnosis not present

## 2023-08-25 DIAGNOSIS — I2510 Atherosclerotic heart disease of native coronary artery without angina pectoris: Secondary | ICD-10-CM | POA: Diagnosis not present

## 2023-08-25 DIAGNOSIS — I255 Ischemic cardiomyopathy: Secondary | ICD-10-CM | POA: Diagnosis not present

## 2023-08-25 DIAGNOSIS — R570 Cardiogenic shock: Secondary | ICD-10-CM | POA: Diagnosis not present

## 2023-08-25 NOTE — Progress Notes (Signed)
  Electrophysiology Office Note:   Date:  08/25/2023  ID:  Juan Snow, DOB 1955/03/14, MRN [id number]  Primary Cardiologist: Lurena MARLA Red, MD Primary Heart Failure: Ezra Shuck, MD Electrophysiologist: Soyla Gladis Norton, MD      History of Present Illness:   Juan Snow is a 68 y.o. male with h/o chronic systolic heart failure, coronary artery disease, hyperlipidemia, history of pulmonary embolism seen today for routine electrophysiology followup.   Since last being seen in our clinic the patient reports doing well.  He has no chest pain or shortness of breath.  He is able to do all of his daily activities without restriction.  He has no acute complaints at this time.  He continues to exercise 3-4 times a week and plays golf up to 2 times a week.  He has no acute issues with this level of activity.  he denies chest pain, palpitations, dyspnea, PND, orthopnea, nausea, vomiting, dizziness, syncope, edema, weight gain, or early satiety.   Review of systems complete and found to be negative unless listed in HPI.      EP Information / Studies Reviewed:    EKG is ordered today. Personal review as below.  EKG Interpretation Date/Time:  Monday August 25 2023 08:59:41 EDT Ventricular Rate:  57 PR Interval:  176 QRS Duration:  82 QT Interval:  384 QTC Calculation: 373 R Axis:   112  Text Interpretation: Sinus bradycardia Anterolateral infarct (cited on or before 10-Mar-2022) ST & T wave abnormality, consider inferior ischemia When compared with ECG of 13-Nov-2022 16:06, QRS axis Shifted right Inverted T waves have replaced nonspecific T wave abnormality in Inferior leads Confirmed by Wilhelmena Zea ([id number]) on 08/25/2023 9:10:39 AM   ICD Interrogation-  reviewed in detail today,  See PACEART report.  Device History: Abbott Single Chamber ICD implanted 07/2022 for chronic systolic heart failure History of appropriate therapy: No History of AAD therapy: No   Risk  Assessment/Calculations:           Physical Exam:   VS:  BP 110/68 (BP Location: Left Arm, Patient Position: Sitting, Cuff Size: Large)   Pulse (!) 57   Ht 5' 8 (1.727 m)   Wt 187 lb (84.8 kg)   SpO2 95%   BMI 28.43 kg/m    Wt Readings from Last 3 Encounters:  08/25/23 187 lb (84.8 kg)  08/01/23 194 lb (88 kg)  01/13/23 184 lb 12.8 oz (83.8 kg)     GEN: Well nourished, well developed in no acute distress NECK: No JVD; No carotid bruits CARDIAC: Regular rate and rhythm, no murmurs, rubs, gallops RESPIRATORY:  Clear to auscultation without rales, wheezing or rhonchi  ABDOMEN: Soft, non-tender, non-distended EXTREMITIES:  No edema; No deformity   ASSESSMENT AND PLAN:    Chronic systolic dysfunction s/p Abbott single chamber ICD  euvolemic today Stable on an appropriate medical regimen Normal ICD function See Pace Art report No changes today  2.  Coronary artery disease: No current angina.  Anterolateral STEMI January 2024 followed by CABG.  Plan per primary cardiology.  3.  Hyperlipidemia: Continue Crestor  and Zetia  per primary cardiology  4.  Pulmonary embolism: On Eliquis  with plans for long-term anticoagulation.  Disposition:   Follow up with EP APP in 12 months   Signed, Kyndal Gloster Gladis Norton, MD

## 2023-08-25 NOTE — Patient Instructions (Signed)
 Medication Instructions:  Your physician recommends that you continue on your current medications as directed. Please refer to the Current Medication list given to you today.  *If you need a refill on your cardiac medications before your next appointment, please call your pharmacy*  Lab Work: None ordered  Testing/Procedures: None ordered  Follow-Up: At [facility], you and your health needs are our priority.  As part of our continuing mission to provide you with exceptional heart care, our providers are all part of one team.  This team includes your primary Cardiologist (physician) and Advanced Practice Providers or APPs (Physician Assistants and Nurse Practitioners) who all work together to provide you with the care you need, when you need it.  Your next appointment:   1 year(s)  Provider:   You will see one of the following Advanced Practice Providers on your designated Care Team:   Charlies Arthur, PA-C Michael Andy Tillery, PA-C Suzann Riddle, NP Daphne Barrack, NP   Thank you for choosing [facility]!!   Maeola Domino, RN [phone]

## 2023-09-22 NOTE — Progress Notes (Signed)
 Remote ICD transmission.

## 2023-10-22 ENCOUNTER — Other Ambulatory Visit: Payer: Self-pay | Admitting: Internal Medicine

## 2023-11-04 ENCOUNTER — Ambulatory Visit (INDEPENDENT_AMBULATORY_CARE_PROVIDER_SITE_OTHER): Payer: Self-pay

## 2023-11-04 DIAGNOSIS — I5020 Unspecified systolic (congestive) heart failure: Secondary | ICD-10-CM | POA: Diagnosis not present

## 2023-11-05 LAB — CUP PACEART REMOTE DEVICE CHECK
Battery Remaining Longevity: 83 mo
Battery Remaining Percentage: 81 %
Battery Voltage: 3.02 V
Brady Statistic RV Percent Paced: 1 %
Date Time Interrogation Session: 20250901024550
HighPow Impedance: 79 Ohm
HighPow Impedance: 79 Ohm
Implantable Lead Connection Status: 753985
Implantable Lead Implant Date: 20240529
Implantable Lead Location: 753860
Implantable Lead Model: 7122
Implantable Pulse Generator Implant Date: 20240529
Lead Channel Impedance Value: 560 Ohm
Lead Channel Pacing Threshold Amplitude: 1.75 V
Lead Channel Pacing Threshold Pulse Width: 1 ms
Lead Channel Sensing Intrinsic Amplitude: 12 mV
Lead Channel Setting Pacing Amplitude: 2.5 V
Lead Channel Setting Pacing Pulse Width: 1 ms
Lead Channel Setting Sensing Sensitivity: 0.5 mV
Pulse Gen Serial Number: 8938963

## 2023-11-06 ENCOUNTER — Ambulatory Visit: Payer: Self-pay | Admitting: Cardiology

## 2023-11-11 ENCOUNTER — Other Ambulatory Visit (HOSPITAL_COMMUNITY): Payer: Self-pay | Admitting: Cardiology

## 2023-11-11 NOTE — Progress Notes (Signed)
Remote ICD Transmission.

## 2023-11-15 ENCOUNTER — Other Ambulatory Visit (HOSPITAL_COMMUNITY): Payer: Self-pay | Admitting: Cardiology

## 2023-12-12 ENCOUNTER — Ambulatory Visit (HOSPITAL_COMMUNITY)
Admission: RE | Admit: 2023-12-12 | Discharge: 2023-12-12 | Disposition: A | Discharge status: Home / Self Care | Source: Ambulatory Visit | Attending: Cardiology | Admitting: Cardiology

## 2023-12-12 ENCOUNTER — Encounter (HOSPITAL_COMMUNITY): Payer: Self-pay | Admitting: Cardiology

## 2023-12-12 ENCOUNTER — Ambulatory Visit (HOSPITAL_COMMUNITY): Payer: Self-pay | Admitting: Cardiology

## 2023-12-12 VITALS — BP 104/70 | HR 53 | Wt 194.0 lb

## 2023-12-12 DIAGNOSIS — Z79899 Other long term (current) drug therapy: Secondary | ICD-10-CM | POA: Diagnosis not present

## 2023-12-12 DIAGNOSIS — Z951 Presence of aortocoronary bypass graft: Secondary | ICD-10-CM | POA: Diagnosis not present

## 2023-12-12 DIAGNOSIS — K219 Gastro-esophageal reflux disease without esophagitis: Secondary | ICD-10-CM | POA: Insufficient documentation

## 2023-12-12 DIAGNOSIS — I255 Ischemic cardiomyopathy: Secondary | ICD-10-CM | POA: Diagnosis not present

## 2023-12-12 DIAGNOSIS — Z86711 Personal history of pulmonary embolism: Secondary | ICD-10-CM | POA: Insufficient documentation

## 2023-12-12 DIAGNOSIS — I2510 Atherosclerotic heart disease of native coronary artery without angina pectoris: Secondary | ICD-10-CM | POA: Insufficient documentation

## 2023-12-12 DIAGNOSIS — Z4502 Encounter for adjustment and management of automatic implantable cardiac defibrillator: Secondary | ICD-10-CM | POA: Insufficient documentation

## 2023-12-12 DIAGNOSIS — I252 Old myocardial infarction: Secondary | ICD-10-CM | POA: Insufficient documentation

## 2023-12-12 DIAGNOSIS — I5022 Chronic systolic (congestive) heart failure: Secondary | ICD-10-CM | POA: Diagnosis not present

## 2023-12-12 DIAGNOSIS — I5020 Unspecified systolic (congestive) heart failure: Secondary | ICD-10-CM | POA: Diagnosis not present

## 2023-12-12 DIAGNOSIS — R001 Bradycardia, unspecified: Secondary | ICD-10-CM | POA: Insufficient documentation

## 2023-12-12 DIAGNOSIS — M1990 Unspecified osteoarthritis, unspecified site: Secondary | ICD-10-CM | POA: Diagnosis not present

## 2023-12-12 DIAGNOSIS — N1831 Chronic kidney disease, stage 3a: Secondary | ICD-10-CM | POA: Insufficient documentation

## 2023-12-12 DIAGNOSIS — Z7901 Long term (current) use of anticoagulants: Secondary | ICD-10-CM | POA: Insufficient documentation

## 2023-12-12 LAB — LIPID PANEL
Cholesterol: 115 mg/dL (ref 0–200)
HDL: 55 mg/dL (ref >40)
LDL Cholesterol: 47 mg/dL (ref 0–99)
Total CHOL/HDL Ratio: 2.1 ratio
Triglycerides: 67 mg/dL (ref <150)
VLDL: 13 mg/dL (ref 0–40)

## 2023-12-12 LAB — BRAIN NATRIURETIC PEPTIDE: B Natriuretic Peptide: 180.1 pg/mL — ABNORMAL HIGH (ref 0.0–100.0)

## 2023-12-12 LAB — BASIC METABOLIC PANEL WITH GFR
Anion gap: 10 (ref 5–15)
BUN: 21 mg/dL (ref 8–23)
CO2: 24 mmol/L (ref 22–32)
Calcium: 9.9 mg/dL (ref 8.9–10.3)
Chloride: 105 mmol/L (ref 98–111)
Creatinine, Ser: 1.52 mg/dL — ABNORMAL HIGH (ref 0.61–1.24)
GFR, Estimated: 50 mL/min — ABNORMAL LOW (ref >60)
Glucose, Bld: 86 mg/dL (ref 70–99)
Potassium: 5.4 mmol/L — ABNORMAL HIGH (ref 3.5–5.1)
Sodium: 139 mmol/L (ref 135–145)

## 2023-12-12 MED ORDER — SACUBITRIL-VALSARTAN 49-51 MG PO TABS: 1.0000 | ORAL_TABLET | Freq: Two times a day (BID) | ORAL | 11 refills | Status: DC

## 2023-12-12 NOTE — Telephone Encounter (Signed)
Patient notified. Lab appointment scheduled.  

## 2023-12-12 NOTE — Patient Instructions (Signed)
 INCREASE Entresto  to 49/51 mg Twice daily  Labs done today, your results will be available in MyChart, we will contact you for abnormal readings.  REPEAT blood work in 10 days.  Your physician has requested that you have an echocardiogram. Echocardiography is a painless test that uses sound waves to create images of your heart. It provides your doctor with information about the size and shape of your heart and how well your heart's chambers and valves are working. This procedure takes approximately one hour. There are no restrictions for this procedure. Please do NOT wear cologne, perfume, aftershave, or lotions (deodorant is allowed). Please arrive 15 minutes prior to your appointment time.  Please note: We ask at that you not bring children with you during ultrasound (echo/ vascular) testing. Due to room size and safety concerns, children are not allowed in the ultrasound rooms during exams. Our front office staff cannot provide observation of children in our lobby area while testing is being conducted. An adult accompanying a patient to their appointment will only be allowed in the ultrasound room at the discretion of the ultrasound technician under special circumstances. We apologize for any inconvenience.  You are scheduled for a Cardiopulmonary Exercise (CPX) Test as [facility] on: Date:      Time:   Expect to be in the lab for 2 hours. Please plan to arrive 30 minutes prior to your appointment. You may be asked to reschedule your test if you arrive 20 minutes or more after your scheduled appointment time.  Main Campus address: [address] [place], [place] [address] You may arrive to the Main Entrance A or Entrance C (free valet parking is available at both). -Main Entrance A (on [address]) :proceed to admitting for check in -Entrance C (on [facility]): proceed to [employer] parking or under hospital deck parking using this code _________  Check In: [facility]  waiting room (1st floor)   General Instructions for the day of the test (Please follow all instructions from your physician): Refrain from ingesting a heavy meal, alcohol, or caffeine or using tobacco products within 2 hours of the test (DO NOT FAST for mare than 8 hours). You may have all other non-alcoholic, non -caffeinated beverage,a light snack (crackers,a piece of fruit, carrot sticks, toast bagel,etc) up to your appointment. Avoid significant exertion or exercise within 24 hours of your test. Be prepared to exercise and sweat. Your clothing should permit freedom of movement and include walking or running shoes. Women bring loose fitting short sleeved blouse.  This evaluation may be fatiguing and you may wish ti have someone accompany you to the assessment to drive you home afterward. Bring a list of your medications with you, including dosage and frequency you take the medications (  I.e.,once per day, twice per day, etc). Take all medications as prescribed, unless noted below or instructed to do so by your physician.  Please do not take the following medications prior to your CPX:  _________________________________________________  _________________________________________________  Brief description of the test: A brief lung test will be performed. This will involve you taking deep breaths and blowing hard and fast through your mouth. During these , a clip will be on your nose and you will be breathing through a breathing device.   For the exercise portion of the test you will be walking on a treadmill, or riding a stationary bike, to your maximal effor or until symptoms such as chest pain, shortness of breath, leg  pain or dizziness limit your exercise. You will be breathing in and out of a breathing device through your mouth (a clip will be on your nose again). Your heart rate, ECG, blood pressure, oxygen saturations, breathing rate and depth, amount of oxygen you consume and amount of  carbon dioxide you produce will be measured and monitored throughout the exercise test.  If you need to cancel or reschedule your appointment please call [phone] If you have further questions please call your physician or Damien Nunnery at [phone]  Your physician recommends that you schedule a follow-up appointment in: 4 months.  If you have any questions or concerns before your next appointment please send us  a message through [place] or call our office at [phone].    TO LEAVE A MESSAGE FOR THE NURSE SELECT OPTION 2, PLEASE LEAVE A MESSAGE INCLUDING: YOUR NAME DATE OF BIRTH CALL BACK NUMBER REASON FOR CALL**this is important as we prioritize the call backs  YOU WILL RECEIVE A CALL BACK THE SAME DAY AS LONG AS YOU CALL BEFORE 4:00 PM  At the [facility], you and your health needs are our priority. As part of our continuing mission to provide you with exceptional heart care, we have created designated Provider Care Teams. These Care Teams include your primary Cardiologist (physician) and Advanced Practice Providers (APPs- Physician Assistants and Nurse Practitioners) who all work together to provide you with the care you need, when you need it.   You may see any of the following providers on your designated Care Team at your next follow up: Dr Toribio Fuel Dr Ezra Shuck Dr. Ria Commander Dr. Morene Brownie Amy Lenetta, NP Caffie Shed, [place] [facility] [place], [place] Beckey Coe, NP [place] Lee, NP Ellouise Class, NP Tinnie Redman, PharmD Jaun Bash, PharmD   Please be sure to bring in all your medications bottles to every appointment.    Thank you for choosing [facility]

## 2023-12-14 NOTE — Progress Notes (Signed)
 [facility] PROGRESS NOTE  Primary Care: Dayna Motto, DO HF Cardiologist: Dr. Rolan  Chief complaint: CHF  HPI: 68 y.o. male with a history of arthritis, GERD and submassive PE 3/23 treated with catheter directed thrombolysis, CKD stage IIIa, CAD s/p CABG x 2, and HFrEF.   Admitted 1/24 w/ acute anterolateral STEMI. Incidentally found to be COVID +. Emergent cath showed high grade ostial LAD to proximal LAD lesion in the 80% stenosis range as well as a 80% ramus intermedius lesion. Ventriculography demonstrated an anterior wall motion abnormality with an LVEDP of 22 mmHg. Given that the patient had TIMI-3 flow in all vessels, percutaneous intervention was deferred. Felt best suited for surgical revascularization. IABP placed and referred for CABG. Pre-op echo showed severely reduced LVEF 25-30%, RV normal, trivial MR w/ AK of the mid and distal anterior wall, mid and distal lateral wall, mid and distal  anterior septum, entire apex, mid anterolateral segment, and mid inferoseptal walls (prior Echo 3/23 in setting of submassive PE w/ mod reduced RV and LVEF 50-55%, repeat limited echo 6/23 showed RV function returned to normal w/ LVEF up to 60-65%). He underwent CABG x 2 w/ LIMA-LAD and SVG-ramus. Intra-op, he developed acute worsening of LV and RV function as well as acute rise in PA pressure (PASP 80s-90s).  Difficult to wean off bypass, but successful wean with iNO, inotropes, pressors, IABP.  Eventually able to wean pressors and remove IABP. Hospitalization complicated by pseudomonas bacteremia, ID consulted and completed IV abx (unclear source). GDMT titrated but limited by AKI. He was discharged home, weight 183 lbs.  Repeat echo in 4/24 showed EF < 20%, mild LV dilation, mild RV dilation with normal RV systolic function, mild MR, normal IVC.   5/24 underwent ICD implant by Dr. Fernande. St Jude device, single chamber.   Echo in 11/24 showed EF < 20%, moderate LV dilation, moderate RV  enlargement, severe RV dysfunction.   He returns today for routine f/u. Here w/ wife. He is doing well symptomatically.  Goes to the gym several days/week, bikes for 10-15 minutes, uses treadmill for 10-15 minutes, lifts weights.  No significant exertional dyspnea or chest pain.  SBP 100s-110s at home.  No orthopnea/PND.   Weight up about 5 lbs.   ECG (personally reviewed): NSR, LAFB, old anterior MI.   St Jude device interrogation: Recent fluid accumulation now thoracic impedance trending back up.  No VT.   Labs (1/24): K 4.4, creatinine 1.73 Labs (2/24): K 4  Creatinine 1.45  Labs (4/24): K 4.5, creatinine 1.53, LDL 52 Labs (5/24): K 4.4, creatinine 1.54  Labs (1/25): LDL 34 Labs (7/24): K 4.4, creatinine 1.72  PMH: 1. CKD stage 3 2. Hyperlipidemia 3. CAD: Acute anterolateral STEMI in 1/24.  - LHC with 80% ostial/proximal LAD and 80% ostial ramus.  - CABG with LIMA-LAD and SVG-ramus.  4. Pseudomonas bacteremia (1/24) post-op CABG 5. PE: Submassive in 3/23 6. Chronic systolic CHF: Ischemic cardiomyopathy. St Jude ICD.  - Echo (6/23): EF 60-65%, normal RV systolic function  - Echo (1/24): EF 25-30%, grade II DD, normal RV  - Echo (4/24): EF < 20%, mild LV dilation, mild RV dilation with normal RV systolic function, mild MR, normal IVC. - Echo (11/24): EF < 20%, moderate LV dilation, moderate RV enlargement, severe RV dysfunction.    Current Outpatient Medications  Medication Sig Dispense Refill   apixaban  (ELIQUIS ) 5 MG TABS tablet Take 1 tablet (5 mg total) by mouth 2 (two) times  daily. 180 tablet 3   Ascorbic Acid (VITAMIN C PO) Take 1 tablet by mouth daily.     Cholecalciferol (VITAMIN D-3 PO) Take 1 tablet by mouth daily.     Coenzyme Q10 (CO Q-10 PO) Take 1 capsule by mouth daily.     ezetimibe  (ZETIA ) 10 MG tablet TAKE 1 TABLET(10 MG) BY MOUTH DAILY 90 tablet 3   famotidine -calcium  carbonate-magnesium  hydroxide (PEPCID  COMPLETE) 10-800-165 MG chewable tablet Chew 1 tablet  by mouth 2 (two) times daily as needed (acid reflux).     FARXIGA  10 MG TABS tablet Take 1 tablet (10 mg total) by mouth daily. 90 tablet 3   Menaquinone-7 (VITAMIN K2 PO) Take 1 tablet by mouth daily.     metoprolol  succinate (TOPROL -XL) 25 MG 24 hr tablet Take 1 tablet (25 mg total) by mouth daily. 90 tablet 3   rosuvastatin  (CRESTOR ) 40 MG tablet TAKE 1 TABLET(40 MG) BY MOUTH DAILY. NEED FOLLOW UP APPOINTMENT FOR MORE REFILLS 90 tablet 0   sacubitril-valsartan (ENTRESTO ) 49-51 MG Take 1 tablet by mouth 2 (two) times daily. 60 tablet 11   sildenafil (REVATIO) 20 MG tablet Take 60-100 mg by mouth as needed (erectile dysfunction).     spironolactone  (ALDACTONE ) 25 MG tablet Take 1 tablet (25 mg total) by mouth daily. 90 tablet 3   No current facility-administered medications for this encounter.   No Known Allergies  Social History   Socioeconomic History   Marital status: Married    Spouse name: Not on file   Number of children: Not on file   Years of education: Not on file   Highest education level: Not on file  Occupational History   Not on file  Tobacco Use   Smoking status: Never   Smokeless tobacco: Never  Vaping Use   Vaping status: Never Used  Substance and Sexual Activity   Alcohol use: No   Drug use: No   Sexual activity: Not on file  Other Topics Concern   Not on file  Social History Narrative   Not on file   Social Drivers of Health   Financial Resource Strain: Not on file  Food Insecurity: No Food Insecurity (03/10/2022)   Hunger Vital Sign    Worried About Running Out of Food in the Last Year: Never true    Ran Out of Food in the Last Year: Never true  Transportation Needs: No Transportation Needs (03/10/2022)   PRAPARE - Administrator, Civil Service (Medical): No    Lack of Transportation (Non-Medical): No  Physical Activity: Not on file  Stress: Not on file  Social Connections: Not on file  Intimate Partner Violence: Not At Risk (03/10/2022)    Humiliation, Afraid, Rape, and Kick questionnaire    Fear of Current or Ex-Partner: No    Emotionally Abused: No    Physically Abused: No    Sexually Abused: No   Family History  Problem Relation Age of Onset   CAD Brother    Hypertension Other    Diabetes Neg Hx    BP 104/70   Pulse (!) 53   Wt 88 kg (194 lb)   SpO2 98%   BMI 29.50 kg/m   Wt Readings from Last 3 Encounters:  12/12/23 88 kg (194 lb)  08/25/23 84.8 kg (187 lb)  08/01/23 88 kg (194 lb)  PHYSICAL EXAM: General: NAD Neck: No JVD, no thyromegaly or thyroid nodule.  Lungs: Clear to auscultation bilaterally with normal respiratory effort. CV: Nondisplaced PMI.  Heart regular S1/S2, no S3/S4, no murmur.  No peripheral edema.  No carotid bruit.  Normal pedal pulses.  Abdomen: Soft, nontender, no hepatosplenomegaly, no distention.  Skin: Intact without lesions or rashes.  Neurologic: Alert and oriented x 3.  Psych: Normal affect. Extremities: No clubbing or cyanosis.  HEENT: Normal.   ASSESSMENT & PLAN: 1. Chronic systolic CHF:  Ischemic cardiomyopathy. Echo in 1/24 with LV EF 25-30%, wall motion abnormalities, normal RV. LHC w/ severe ostial LAD to proximal LAD stenosis as well as a 80% ramus intermedius lesion. S/p CABG x 2 utilizing LIMA to LAD and SVG to Ramus. Echo in 4/24 showed EF < 20%, mild LV dilation, mild RV dilation with normal RV systolic function, mild MR, normal IVC.  St Jude single chamber ICD placed 5/24. Echo in 11/24 again showed EF < 20%, moderate LV dilation, moderate RV enlargement, severe RV dysfunction.  NYHA class I by report despite persistently very low EF.  Not volume overloaded by exam, recent volume overload by Corvue now improving.  - I will try to increase Entresto  to 49/51 bid.  If BP does not tolerate, decrease back to 24/26 bid.  BMET/BNP today, BMET in 10 days.  - Continue Farxiga  10 mg daily. - Continue spironolactone  25 mg daily. - Continue Toprol  XL 25 mg daily.  HR in low 50s so  will not increase.  - Does not need daily loop diuretic  - I will arrange for echo.  - I will arrange for CPX.  2. CAD: s/p anterolateral MI in 1/24 with cath showing 80% ostial LAD stenosis and 80% ostial large ramus stenosis.  Ostial lesions in close proximity made PCI very difficult and CABG was elected.  Underwent CABG x 2 w/ LIMA-LAD and SVG-ramus on 03/12/22. No chest pain.  - He is not on ASA given apixaban  use.  - Continue Crestor  40 mg daily, check lipids today.  3. H/o PE: Submassive PE in 3/23, treated with catheter-directed thrombolysis.  - Continue Eliquis .  4. CKD Stage III:   - BMET today.   F/u in 3-4 months with APP.   I spent 31 minutes reviewing records, interviewing/examining patient, and managing orders.   Ezra Shuck,  12/14/23

## 2023-12-16 ENCOUNTER — Other Ambulatory Visit (HOSPITAL_COMMUNITY): Payer: Self-pay | Admitting: Cardiology

## 2023-12-16 DIAGNOSIS — I5020 Unspecified systolic (congestive) heart failure: Secondary | ICD-10-CM

## 2023-12-19 ENCOUNTER — Telehealth: Payer: Self-pay | Admitting: Internal Medicine

## 2023-12-19 NOTE — Telephone Encounter (Signed)
 Pt calling to state pt is having blood work by us , and them. He would only like to have blood work once and would like to know if this office will take his other offices order. Please advise.

## 2023-12-19 NOTE — Telephone Encounter (Signed)
 Spoke with Juan Snow regarding pt having labs drawn in the near future. Pt was recently seen by Dr. Rolan and labs were drawn then and follow up labs ordered at that time as well. Advised that she will need to touch base with Dr. Rolan regarding adding additional labs. Phone number provided and will forward message to Heart Failure Triage.

## 2023-12-19 NOTE — Telephone Encounter (Signed)
 Left message for Jan to call back regarding labs for pt.

## 2023-12-23 ENCOUNTER — Ambulatory Visit (HOSPITAL_COMMUNITY)

## 2023-12-23 ENCOUNTER — Ambulatory Visit (HOSPITAL_COMMUNITY)
Admission: RE | Admit: 2023-12-23 | Discharge: 2023-12-23 | Disposition: A | Discharge status: Home / Self Care | Source: Ambulatory Visit | Attending: Cardiology | Admitting: Cardiology

## 2023-12-23 DIAGNOSIS — I5020 Unspecified systolic (congestive) heart failure: Secondary | ICD-10-CM

## 2023-12-23 LAB — BASIC METABOLIC PANEL WITH GFR
Anion gap: 15 (ref 5–15)
BUN: 17 mg/dL (ref 8–23)
CO2: 16 mmol/L — ABNORMAL LOW (ref 22–32)
Calcium: 9.4 mg/dL (ref 8.9–10.3)
Chloride: 107 mmol/L (ref 98–111)
Creatinine, Ser: 1.6 mg/dL — ABNORMAL HIGH (ref 0.61–1.24)
GFR, Estimated: 47 mL/min — ABNORMAL LOW (ref >60)
Glucose, Bld: 101 mg/dL — ABNORMAL HIGH (ref 70–99)
Potassium: 4.3 mmol/L (ref 3.5–5.1)
Sodium: 138 mmol/L (ref 135–145)

## 2023-12-24 DIAGNOSIS — E7800 Pure hypercholesterolemia, unspecified: Secondary | ICD-10-CM | POA: Diagnosis not present

## 2023-12-24 DIAGNOSIS — I5020 Unspecified systolic (congestive) heart failure: Secondary | ICD-10-CM | POA: Diagnosis not present

## 2023-12-24 DIAGNOSIS — N1831 Chronic kidney disease, stage 3a: Secondary | ICD-10-CM | POA: Diagnosis not present

## 2023-12-24 DIAGNOSIS — E559 Vitamin D deficiency, unspecified: Secondary | ICD-10-CM | POA: Diagnosis not present

## 2023-12-24 DIAGNOSIS — I255 Ischemic cardiomyopathy: Secondary | ICD-10-CM | POA: Diagnosis not present

## 2023-12-24 DIAGNOSIS — Z125 Encounter for screening for malignant neoplasm of prostate: Secondary | ICD-10-CM | POA: Diagnosis not present

## 2023-12-24 DIAGNOSIS — E569 Vitamin deficiency, unspecified: Secondary | ICD-10-CM | POA: Diagnosis not present

## 2023-12-24 NOTE — Telephone Encounter (Addendum)
 Pt aware, agreeable, and verbalized understanding   ----- Message from Ezra Shuck sent at 12/23/2023  9:50 PM EDT ----- Mild functional impairment due to heart failure.   ----- Message ----- From: Star Damien BRAVO Sent: 12/23/2023   3:21 PM EDT To: Ezra GORMAN Shuck, MD

## 2023-12-26 DIAGNOSIS — E569 Vitamin deficiency, unspecified: Secondary | ICD-10-CM | POA: Diagnosis not present

## 2023-12-26 DIAGNOSIS — Z125 Encounter for screening for malignant neoplasm of prostate: Secondary | ICD-10-CM | POA: Diagnosis not present

## 2023-12-26 DIAGNOSIS — I252 Old myocardial infarction: Secondary | ICD-10-CM | POA: Diagnosis not present

## 2023-12-26 DIAGNOSIS — E7800 Pure hypercholesterolemia, unspecified: Secondary | ICD-10-CM | POA: Diagnosis not present

## 2023-12-26 DIAGNOSIS — I5020 Unspecified systolic (congestive) heart failure: Secondary | ICD-10-CM | POA: Diagnosis not present

## 2023-12-26 DIAGNOSIS — N1831 Chronic kidney disease, stage 3a: Secondary | ICD-10-CM | POA: Diagnosis not present

## 2023-12-26 DIAGNOSIS — I255 Ischemic cardiomyopathy: Secondary | ICD-10-CM | POA: Diagnosis not present

## 2023-12-26 DIAGNOSIS — E559 Vitamin D deficiency, unspecified: Secondary | ICD-10-CM | POA: Diagnosis not present

## 2023-12-26 DIAGNOSIS — R7303 Prediabetes: Secondary | ICD-10-CM | POA: Diagnosis not present

## 2024-01-07 ENCOUNTER — Ambulatory Visit (HOSPITAL_COMMUNITY)
Admission: RE | Admit: 2024-01-07 | Discharge: 2024-01-07 | Disposition: A | Discharge status: Home / Self Care | Source: Ambulatory Visit | Attending: Cardiology | Admitting: Cardiology

## 2024-01-07 DIAGNOSIS — Z95810 Presence of automatic (implantable) cardiac defibrillator: Secondary | ICD-10-CM | POA: Insufficient documentation

## 2024-01-07 DIAGNOSIS — I5020 Unspecified systolic (congestive) heart failure: Secondary | ICD-10-CM | POA: Diagnosis not present

## 2024-01-07 DIAGNOSIS — I2510 Atherosclerotic heart disease of native coronary artery without angina pectoris: Secondary | ICD-10-CM | POA: Diagnosis not present

## 2024-01-07 DIAGNOSIS — Z951 Presence of aortocoronary bypass graft: Secondary | ICD-10-CM | POA: Diagnosis not present

## 2024-01-07 DIAGNOSIS — I429 Cardiomyopathy, unspecified: Secondary | ICD-10-CM | POA: Insufficient documentation

## 2024-01-07 DIAGNOSIS — E785 Hyperlipidemia, unspecified: Secondary | ICD-10-CM | POA: Diagnosis not present

## 2024-01-07 DIAGNOSIS — I272 Pulmonary hypertension, unspecified: Secondary | ICD-10-CM | POA: Diagnosis not present

## 2024-01-07 DIAGNOSIS — I351 Nonrheumatic aortic (valve) insufficiency: Secondary | ICD-10-CM | POA: Insufficient documentation

## 2024-01-07 LAB — ECHOCARDIOGRAM COMPLETE
Area-P 1/2: 2.93 cm2
Est EF: <20
P 1/2 time: 556 ms
S' Lateral: 5.3 cm

## 2024-01-07 MED ORDER — PERFLUTREN LIPID MICROSPHERE
1.0000 mL | INTRAVENOUS | Status: AC | PRN
  Administered 2024-01-07: 4 mL via INTRAVENOUS

## 2024-01-07 NOTE — Progress Notes (Signed)
  Echocardiogram 2D Echocardiogram has been performed.  LAMON MAXWELL 01/07/2024, 9:02 AM

## 2024-01-08 NOTE — Telephone Encounter (Addendum)
  Spoke to patient regarding echocardiogram results. He has been taking Eliquis  as prescribed and has not missed any doses.  ----- Message from Ezra Shuck sent at 01/07/2024  2:50 PM EST ----- EF remains low, < 20%.  He has a layered apical thrombus in the LV.  Please make sure that he is taking Eliquis  5 mg bid and not missing doses.  ----- Message ----- From: Interface, [employer] Sent: 01/07/2024   1:57 PM EST To: Ezra GORMAN Shuck, MD

## 2024-01-12 ENCOUNTER — Other Ambulatory Visit (HOSPITAL_COMMUNITY): Payer: Self-pay

## 2024-02-03 ENCOUNTER — Ambulatory Visit: Payer: Self-pay

## 2024-02-04 LAB — CUP PACEART REMOTE DEVICE CHECK
Battery Remaining Longevity: 81 mo
Battery Remaining Percentage: 79 %
Battery Voltage: 3.02 V
Brady Statistic RV Percent Paced: 1 %
Date Time Interrogation Session: 20251201020015
HighPow Impedance: 79 Ohm
HighPow Impedance: 79 Ohm
Implantable Lead Connection Status: 753985
Implantable Lead Implant Date: 20240529
Implantable Lead Location: 753860
Implantable Lead Model: 7122
Implantable Pulse Generator Implant Date: 20240529
Lead Channel Impedance Value: 560 Ohm
Lead Channel Pacing Threshold Amplitude: 1.75 V
Lead Channel Pacing Threshold Pulse Width: 1 ms
Lead Channel Sensing Intrinsic Amplitude: 12 mV
Lead Channel Setting Pacing Amplitude: 2.5 V
Lead Channel Setting Pacing Pulse Width: 1 ms
Lead Channel Setting Sensing Sensitivity: 0.5 mV
Pulse Gen Serial Number: 8938963

## 2024-02-05 ENCOUNTER — Ambulatory Visit: Payer: Self-pay | Admitting: Cardiology

## 2024-02-09 ENCOUNTER — Other Ambulatory Visit (HOSPITAL_COMMUNITY): Payer: Self-pay | Admitting: Cardiology

## 2024-03-22 NOTE — Progress Notes (Unsigned)
 error

## 2024-03-23 ENCOUNTER — Ambulatory Visit: Admitting: Physician Assistant

## 2024-03-23 ENCOUNTER — Encounter: Payer: Self-pay | Admitting: Physician Assistant

## 2024-03-23 VITALS — BP 102/70 | HR 65 | Ht 67.0 in | Wt 188.8 lb

## 2024-03-23 DIAGNOSIS — I5020 Unspecified systolic (congestive) heart failure: Secondary | ICD-10-CM

## 2024-03-23 DIAGNOSIS — N1832 Chronic kidney disease, stage 3b: Secondary | ICD-10-CM | POA: Diagnosis not present

## 2024-03-23 DIAGNOSIS — Z95810 Presence of automatic (implantable) cardiac defibrillator: Secondary | ICD-10-CM

## 2024-03-23 DIAGNOSIS — E785 Hyperlipidemia, unspecified: Secondary | ICD-10-CM

## 2024-03-23 DIAGNOSIS — I2510 Atherosclerotic heart disease of native coronary artery without angina pectoris: Secondary | ICD-10-CM

## 2024-03-23 DIAGNOSIS — I513 Intracardiac thrombosis, not elsewhere classified: Secondary | ICD-10-CM

## 2024-03-23 MED ORDER — ROSUVASTATIN CALCIUM 40 MG PO TABS: 40.0000 mg | ORAL_TABLET | Freq: Every day | ORAL | 3 refills | Status: AC

## 2024-03-23 MED ORDER — FARXIGA 10 MG PO TABS: 10.0000 mg | ORAL_TABLET | Freq: Every day | ORAL | 3 refills | Status: AC

## 2024-03-23 MED ORDER — METOPROLOL SUCCINATE ER 25 MG PO TB24: 25.0000 mg | ORAL_TABLET | Freq: Every day | ORAL | 3 refills | Status: AC

## 2024-03-23 MED ORDER — APIXABAN 5 MG PO TABS: 5.0000 mg | ORAL_TABLET | Freq: Two times a day (BID) | ORAL | 3 refills | Status: AC

## 2024-03-23 MED ORDER — SPIRONOLACTONE 25 MG PO TABS: 25.0000 mg | ORAL_TABLET | Freq: Every day | ORAL | 3 refills | Status: AC

## 2024-03-23 MED ORDER — SACUBITRIL-VALSARTAN 24-26 MG PO TABS: 1.0000 | ORAL_TABLET | Freq: Two times a day (BID) | ORAL | 3 refills | Status: AC

## 2024-03-23 MED ORDER — EZETIMIBE 10 MG PO TABS: 10.0000 mg | ORAL_TABLET | Freq: Every day | ORAL | 3 refills | Status: AC

## 2024-03-23 NOTE — Progress Notes (Addendum)
 " OFFICE NOTE:    Date:  03/23/2024  ID:  Juan Snow, DOB Feb 25, 1956, MRN [id number] PCP: Dayna Motto, DO  [facility] Providers Cardiologist:  Lurena MARLA Red, MD Electrophysiologist:  Soyla Gladis Norton, MD  Advanced Heart Failure:  Ezra Shuck, MD        Coronary artery disease Anterolateral STEMI 03/10/2022 Tx w CABG x 2 (LIMA-LAD, SVG-RI) High grade oLAD and RI stenosis >> managed w IABP C/b Supraventricular Tachycardia/atrial ectopy; AKI (SCr ? 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 TTE 03/11/2022: EF 25-30, GR 2 DD, normal RVSF, mildly elev PASP, trivial MR, RAP 3 Limited TTE 03/25/22: EF 25-30, Gr 2 DD, trivial MR, dist ant-sept/inf-sept/apical AK; inf/apico-lat/mid ant-lat/apico-ant/basal inf-sept HK TTE 06/10/2022: EF <20, no RWMA, normal RVSF, moderately elevated PASP, mild MR, moderate TR, RAP 3, RVSP 52.6 TTE 01/29/2023: EF <20, GR 3 DD, apical DK, inferior HK, anterior and anteroseptal AK, severely reduced RVSF, RVSP 37.8, moderate LAE TTE 01/07/2024: Apical mural thrombus, EF <20, GR 3 DD, moderately reduced RVSF, severe LAE, trivial MR, mild AI CPET 12/23/2023: Peak VO2 suggest mild functional impairment from heart failure, Ve/VCO2 slope suggest moderate limitation LV Mural Thrombus  Nonsustained ventricular tachycardia S/p single-chamber ICD 07/2022 Pulmonary embolism S/p submassive PE in 05/2021-Tx catheter directed thrombolysis; Xarelto  Rx Hyperlipidemia Pre-diabetes  Chronic kidney disease        Discussed the use of AI scribe software for clinical note transcription with the patient, who gave verbal consent to proceed. History of Present Illness Juan Snow is a 69 y.o. male for follow up of CAD, CHF. He was last seen in May 2025. He had follow up in 12/2023 with Dr. Shuck in [facility]F Clinic. Follow up TTE showed EF < 20, probable mural thrombus. He confirmed that he has been taking Eliquis . CPET was done  and demonstrated mild to mod limitation from HF.  He is active, exercising three to four times a week. No chest pain. He monitors his blood pressure at home, which remains stable. No significant shortness of breath, edema. He is able to climb a flight of stairs without difficulty. He is looking forward to going to [place] [employer] in a few weeks to celebrate his birthday with his friends.     ROS-See HPI    Studies Reviewed:  EKG Interpretation Date/Time:  Tuesday March 23 2024 13:18:13 EST Ventricular Rate:  60 PR Interval:  172 QRS Duration:  88 QT Interval:  372 QTC Calculation: 372 R Axis:   19  Text Interpretation: Normal sinus rhythm Anterolateral infarct No significant change since last tracing Confirmed by Lelon Hamilton [phone]) on 03/23/2024 2:44:03 PM    LABS 12/12/2023: Total cholesterol 115, HDL 55, LDL 47, triglycerides 67 12/23/2023: K 4.3, creatinine 1.6, eGFR 47         Physical Exam:  VS:  BP 102/70   Pulse 65   Ht 5' 7 (1.702 m)   Wt 188 lb 12.8 oz (85.6 kg)   SpO2 96%   BMI 29.57 kg/m        Wt Readings from Last 3 Encounters:  03/23/24 188 lb 12.8 oz (85.6 kg)  12/12/23 194 lb (88 kg)  08/25/23 187 lb (84.8 kg)    Constitutional:      Appearance: Healthy appearance. Not in distress.  Neck:     Vascular: JVD normal.  Pulmonary:     Breath sounds: Normal breath sounds. No wheezing. No rales.  Cardiovascular:  Normal rate. Regular rhythm.     Murmurs: There is no murmur.  Edema:    Peripheral edema absent.  Abdominal:     Palpations: Abdomen is soft.       Assessment and Plan:    Assessment & Plan HFrEF (heart failure with reduced ejection fraction) (HCC) Ischemic CM. Volume status stable. NYHA class II. CPET last year suggested mild to mod limitation from HF. Recent CoreVue with normal fluid levels. Blood pressure will not likely tolerate Entresto  49/51 mg. He did not remain on it because his insurance did not cover it well. Will keep him  on the 24/26 mg dose.  - Continue Entresto  24/26 mg twice daily - Continue Farxiga  10 mg daily - Continue Toprol  XL 25 mg daily - Continue spironolactone  25 mg daily - Follow up with heart failure clinic as scheduled in February - Follow up with me or Dr. Wendel in 6 mos Coronary artery disease involving native coronary artery of native heart without angina pectoris S/p anterolateral STEMI (January 2024) followed by CABG.  No anginal symptoms reported.   - Continue Zetia  10 mg daily - Continue Crestor  40 mg daily - Continue Toprol  XL 25 mg daily ICD (implantable cardioverter-defibrillator) in place Last check in December with normal impedance on CoreVue. - Continue follow up with electrophysiology as planned Hyperlipidemia LDL goal <70 LDL optimal - Continue rosuvastatin  40 mg daily - Continue Zetia  10 mg daily LV (left ventricular) mural thrombus Noted on recent echocardiogram in 01/2024. He is on long-term anticoagulation with Eliquis  for hx of pulmonary embolism.  - Continue Eliquis  5 mg twice daily - He has follow up with CHF clinic in Feb  - I will d/w Dr. Rolan when to repeat echocardiogram Stage 3b chronic kidney disease (HCC) Recent Creatinine stable.        Dispo:  Return in about 6 months (around 09/20/2024) for Routine Follow Up, w/ Dr. Wendel, or Glendia Ferrier, PA-C.  Signed, Glendia Ferrier, PA-C   "

## 2024-03-23 NOTE — Assessment & Plan Note (Addendum)
 Last check in December with normal impedance on CoreVue. - Continue follow up with electrophysiology as planned

## 2024-03-23 NOTE — Patient Instructions (Signed)
 Medication Instructions:  Your refills have been sent to your pharmacy *If you need a refill on your cardiac medications before your next appointment, please call your pharmacy*  Lab Work: No labs were ordered during today's visit.  If you have labs (blood work) drawn today and your tests are completely normal, you will receive your results only by: MyChart Message (if you have MyChart) OR A paper copy in the mail If you have any lab test that is abnormal or we need to change your treatment, we will call you to review the results.  Testing/Procedures: No procedures were ordered during today's visit.   Follow-Up: At [facility], you and your health needs are our priority.  As part of our continuing mission to provide you with exceptional heart care, our providers are all part of one team.  This team includes your primary Cardiologist (physician) and Advanced Practice Providers or APPs (Physician Assistants and Nurse Practitioners) who all work together to provide you with the care you need, when you need it.  Your next appointment:   6 month(s)  Provider:   Arun K Thukkani, MD or Glendia Ferrier     We recommend signing up for the patient portal called MyChart.  Sign up information is provided on this After Visit Summary.  MyChart is used to connect with patients for Virtual Visits (Telemedicine).  Patients are able to view lab/test results, encounter notes, upcoming appointments, etc.  Non-urgent messages can be sent to your provider as well.   To learn more about what you can do with MyChart, go to [email or web address].   Other Instructions

## 2024-03-23 NOTE — Assessment & Plan Note (Addendum)
 Ischemic CM. Volume status stable. NYHA class II. CPET last year suggested mild to mod limitation from HF. Recent CoreVue with normal fluid levels. Blood pressure will not likely tolerate Entresto  49/51 mg. He did not remain on it because his insurance did not cover it well. Will keep him on the 24/26 mg dose.  - Continue Entresto  24/26 mg twice daily - Continue Farxiga  10 mg daily - Continue Toprol  XL 25 mg daily - Continue spironolactone  25 mg daily - Follow up with heart failure clinic as scheduled in February - Follow up with me or Dr. Wendel in 6 mos

## 2024-03-23 NOTE — Progress Notes (Deleted)
 error

## 2024-03-23 NOTE — Assessment & Plan Note (Addendum)
 LDL optimal - Continue rosuvastatin  40 mg daily - Continue Zetia  10 mg daily

## 2024-04-13 ENCOUNTER — Encounter (HOSPITAL_COMMUNITY)
